# Patient Record
Sex: Male | Born: 1940 | Race: White | Hispanic: No | Marital: Married | Smoking: Former smoker
Health system: Southern US, Community
[De-identification: ages and names within clinical notes are randomized; demographics above are authoritative.]

## PROBLEM LIST (undated history)

## (undated) DIAGNOSIS — G4730 Sleep apnea, unspecified: Secondary | ICD-10-CM

## (undated) DIAGNOSIS — F1010 Alcohol abuse, uncomplicated: Secondary | ICD-10-CM

## (undated) DIAGNOSIS — K226 Gastro-esophageal laceration-hemorrhage syndrome: Secondary | ICD-10-CM

## (undated) DIAGNOSIS — F32A Depression, unspecified: Secondary | ICD-10-CM

## (undated) DIAGNOSIS — I499 Cardiac arrhythmia, unspecified: Secondary | ICD-10-CM

## (undated) DIAGNOSIS — K264 Chronic or unspecified duodenal ulcer with hemorrhage: Secondary | ICD-10-CM

## (undated) DIAGNOSIS — E039 Hypothyroidism, unspecified: Secondary | ICD-10-CM

## (undated) DIAGNOSIS — T8859XA Other complications of anesthesia, initial encounter: Secondary | ICD-10-CM

## (undated) DIAGNOSIS — M1990 Unspecified osteoarthritis, unspecified site: Secondary | ICD-10-CM

## (undated) DIAGNOSIS — M75100 Unspecified rotator cuff tear or rupture of unspecified shoulder, not specified as traumatic: Secondary | ICD-10-CM

## (undated) DIAGNOSIS — K7460 Unspecified cirrhosis of liver: Secondary | ICD-10-CM

## (undated) DIAGNOSIS — T4145XA Adverse effect of unspecified anesthetic, initial encounter: Secondary | ICD-10-CM

## (undated) DIAGNOSIS — C801 Malignant (primary) neoplasm, unspecified: Secondary | ICD-10-CM

## (undated) DIAGNOSIS — M1712 Unilateral primary osteoarthritis, left knee: Secondary | ICD-10-CM

## (undated) DIAGNOSIS — S1121XA Laceration without foreign body of pharynx and cervical esophagus, initial encounter: Secondary | ICD-10-CM

## (undated) DIAGNOSIS — K7030 Alcoholic cirrhosis of liver without ascites: Secondary | ICD-10-CM

## (undated) DIAGNOSIS — J189 Pneumonia, unspecified organism: Secondary | ICD-10-CM

## (undated) DIAGNOSIS — F1910 Other psychoactive substance abuse, uncomplicated: Secondary | ICD-10-CM

## (undated) DIAGNOSIS — T7840XA Allergy, unspecified, initial encounter: Secondary | ICD-10-CM

## (undated) HISTORY — DX: Laceration without foreign body of pharynx and cervical esophagus, initial encounter: S11.21XA

## (undated) HISTORY — DX: Sleep apnea, unspecified: G47.30

## (undated) HISTORY — PX: EYE SURGERY: SHX253

## (undated) HISTORY — DX: Unspecified rotator cuff tear or rupture of unspecified shoulder, not specified as traumatic: M75.100

## (undated) HISTORY — PX: KNEE SURGERY: SHX244

## (undated) HISTORY — PX: BACK SURGERY: SHX140

## (undated) HISTORY — PX: SPINE SURGERY: SHX786

## (undated) HISTORY — PX: ROTATOR CUFF REPAIR: SHX139

## (undated) HISTORY — PX: JOINT REPLACEMENT: SHX530

## (undated) HISTORY — PX: TONSILLECTOMY: SUR1361

## (undated) HISTORY — PX: OTHER SURGICAL HISTORY: SHX169

## (undated) HISTORY — PX: HERNIA REPAIR: SHX51

## (undated) HISTORY — DX: Allergy, unspecified, initial encounter: T78.40XA

## (undated) HISTORY — DX: Unspecified osteoarthritis, unspecified site: M19.90

## (undated) HISTORY — PX: CAUTERY OF TURBINATES: SHX1315

---

## 1973-07-03 DIAGNOSIS — Z87891 Personal history of nicotine dependence: Secondary | ICD-10-CM

## 1973-07-03 HISTORY — DX: Personal history of nicotine dependence: Z87.891

## 2003-10-18 ENCOUNTER — Ambulatory Visit (HOSPITAL_COMMUNITY): Admission: RE | Admit: 2003-10-18 | Discharge: 2003-10-18 | Payer: Self-pay | Admitting: Neurosurgery

## 2003-10-21 ENCOUNTER — Inpatient Hospital Stay (HOSPITAL_COMMUNITY): Admission: RE | Admit: 2003-10-21 | Discharge: 2003-10-22 | Payer: Self-pay | Admitting: Neurosurgery

## 2003-11-30 ENCOUNTER — Encounter: Admission: RE | Admit: 2003-11-30 | Discharge: 2003-11-30 | Payer: Self-pay | Admitting: Neurosurgery

## 2004-01-13 ENCOUNTER — Encounter: Admission: RE | Admit: 2004-01-13 | Discharge: 2004-01-13 | Payer: Self-pay | Admitting: Neurosurgery

## 2005-02-25 LAB — HM COLONOSCOPY: HM Colonoscopy: NORMAL

## 2005-06-27 ENCOUNTER — Inpatient Hospital Stay: Payer: Self-pay | Admitting: Gastroenterology

## 2005-07-25 ENCOUNTER — Ambulatory Visit: Payer: Self-pay | Admitting: Gastroenterology

## 2005-07-25 LAB — HM COLONOSCOPY

## 2007-11-25 ENCOUNTER — Ambulatory Visit: Payer: Self-pay | Admitting: Family Medicine

## 2007-11-26 ENCOUNTER — Ambulatory Visit: Payer: Self-pay | Admitting: Internal Medicine

## 2007-12-03 ENCOUNTER — Ambulatory Visit: Payer: Self-pay | Admitting: Internal Medicine

## 2007-12-27 ENCOUNTER — Ambulatory Visit: Payer: Self-pay | Admitting: Internal Medicine

## 2008-03-03 ENCOUNTER — Ambulatory Visit: Payer: Self-pay | Admitting: Family Medicine

## 2008-03-03 DIAGNOSIS — M1731 Unilateral post-traumatic osteoarthritis, right knee: Secondary | ICD-10-CM | POA: Insufficient documentation

## 2008-03-03 DIAGNOSIS — M1990 Unspecified osteoarthritis, unspecified site: Secondary | ICD-10-CM | POA: Insufficient documentation

## 2008-03-03 DIAGNOSIS — M2180 Other specified acquired deformities of unspecified limb: Secondary | ICD-10-CM | POA: Insufficient documentation

## 2008-03-03 DIAGNOSIS — IMO0002 Reserved for concepts with insufficient information to code with codable children: Secondary | ICD-10-CM | POA: Insufficient documentation

## 2008-03-03 DIAGNOSIS — M1710 Unilateral primary osteoarthritis, unspecified knee: Secondary | ICD-10-CM

## 2008-03-28 ENCOUNTER — Ambulatory Visit: Payer: Self-pay | Admitting: Internal Medicine

## 2008-04-06 ENCOUNTER — Ambulatory Visit: Payer: Self-pay | Admitting: Internal Medicine

## 2008-04-27 ENCOUNTER — Ambulatory Visit: Payer: Self-pay | Admitting: Internal Medicine

## 2008-05-28 ENCOUNTER — Ambulatory Visit: Payer: Self-pay | Admitting: Internal Medicine

## 2008-06-02 ENCOUNTER — Ambulatory Visit: Payer: Self-pay | Admitting: Internal Medicine

## 2008-06-28 ENCOUNTER — Ambulatory Visit: Payer: Self-pay | Admitting: Internal Medicine

## 2008-08-31 ENCOUNTER — Ambulatory Visit: Payer: Self-pay | Admitting: Family Medicine

## 2008-08-31 DIAGNOSIS — M719 Bursopathy, unspecified: Secondary | ICD-10-CM

## 2008-08-31 DIAGNOSIS — M67919 Unspecified disorder of synovium and tendon, unspecified shoulder: Secondary | ICD-10-CM | POA: Insufficient documentation

## 2008-09-29 ENCOUNTER — Encounter: Payer: Self-pay | Admitting: Family Medicine

## 2008-09-30 ENCOUNTER — Encounter: Payer: Self-pay | Admitting: Family Medicine

## 2008-09-30 ENCOUNTER — Telehealth: Payer: Self-pay | Admitting: Family Medicine

## 2008-12-15 ENCOUNTER — Ambulatory Visit: Payer: Self-pay | Admitting: Internal Medicine

## 2008-12-26 ENCOUNTER — Ambulatory Visit: Payer: Self-pay | Admitting: Internal Medicine

## 2010-06-18 ENCOUNTER — Encounter: Payer: Self-pay | Admitting: Neurosurgery

## 2010-06-27 NOTE — Miscellaneous (Signed)
  I called this in to [facility] for the patient.  Clinical Lists Changes  Medications: Added new medication of * SSKI ORAL IODINE SOLUTION Use as directed

## 2010-06-27 NOTE — Assessment & Plan Note (Signed)
Summary: NEW PT TO EST/CLE/FORM IN OCT. BLUE FOLDER**   Vital Signs:  Patient Profile:   70 Years Old Male Weight:      177.38 pounds (80.63 kg) Temp:     98.1 degrees F (36.72 degrees C) oral Pulse rate:   72 / minute Pulse rhythm:   regular BP sitting:   140 / 80  (left arm) Cuff size:   regular  Vitals Entered By: Silas Sacramento (March 03, 2008 11:06 AM)                 Chief Complaint:  New pt.  History of Present Illness: Very active 70 yo white male well known to me presents to discuss several medical issues, primarily musculoskeletal.  blood pressure, bp is good blood sugars have been good  LFT's have been higher. In the past, quit drinking, now has normalized. (quit 2009, summer)  walks three times a week  1 1/2 hours and several other times a week. One nose spray only (Flonase)  Wants to be able to hunt and lifting to keep fit.   two issues. B Knee pain, L > R, and shoulder pain intermittently L knee operation, done fifty years ago Intermittent swelling Open operation, removed bone fragment, done in 1958, so unsure of exact surgery  some OA   Shoulders and knees cause some problems  light weights, house works about with soloflex IROM bothers, shoulder    Past Medical History:    OA, multiple joints    Allergic Rhinitis    Esophageal Tear, 2007, no surgery    RTC Tear, L,  ~1999    Sciatic nerve, s/p neurosurgery  Past Surgical History:    Knee, open surgery, L, 1958, ?loose body/chondral defect    Inguinal herniorrhaphy    Rotator cuff repair, 1999, Ed Hines    Tonsillectomy    Back surgery, 2005   Family History:    Family History of Alcoholism/Addiction    Family History of Arthritis    Family History Breast cancer 1st degree relative <50    Family History Diabetes 1st degree relative    Family History Ovarian cancer  Social History:    Marital Status: Married    Children: 2    Occupation: semi-retired, Designer, fashion/clothing    [employer]  Quit alcohol, 10/2007    Chews tobacco    Avid hunter    Egbert Garibaldi hunting    Very active    Walks daily close to 1 hour   Risk Factors:  Colonoscopy History:     Date of Last Colonoscopy:  02/25/2005   Colonoscopy History:     Date of Last Colonoscopy:  02/25/2005    Results:  normal   Colonoscopy History:     Date of Last Colonoscopy:  05/28/2004   Colonoscopy History:     Date of Last Colonoscopy:  05/28/2004    Results:  normal    Review of Systems      See HPI  MS      Complains of joint pain, muscle aches, and stiffness.      Denies joint redness.      B shoulders, sometimes catch and have pain with IROM, movements at nighttime when rolling over  Neuro      Denies numbness and tingling.   Physical Exam  General:     Well-developed,well-nourished,in no acute distress; alert,appropriate and cooperative throughout examination Head:     Normocephalic and atraumatic without obvious abnormalities. No apparent alopecia or balding.  Ears:     no external deformities.   Nose:     no external deformity.   Additional Exam:     R knee Gait: Normal heel toe pattern ROM: WNL Effusion: neg Echymosis or edema: none Patellar tendon NT Painful PLICA: neg Patellar grind: positive Medial and lateral patellar facet loading:painful NT medial and lateral joint lines Mcmurray's neg Flexion-pinch neg Varus and valgus stress: stable Lachman: neg Ant and Post drawer: neg Hip abduction, IR, ER: WNL Hip flexion str: 5/5 Hip abd: 5/5 Quad: 5/5 No VMO atrophy  L knee Gait: Normal heel toe pattern ROM: WNL Effusion: neg Echymosis or edema: none Patellar tendon NT Painful PLICA: neg Patellar grind: positive Medial and lateral patellar facet loading: painful NT medial and lateral joint lines - markedly enlarged medial joint, large vertical incision scar Mcmurray's neg Flexion-pinch neg Varus and valgus stress: stable Lachman: significant anterior translation with no  endpoint: positive Ant and Post drawer: again with significant anterior translation compared to contralateral side Hip abduction, IR, ER: WNL  Shoulder: B Inspection: No muscle wasting mild winging B Ecchymosis/edema: neg  AC joint, scapula, clavicle: NT Abduction: full, 5/5 Flexion: full, 5/5 IR, full, lift-off: 5/5 ER at neutral: full, 5/5 AC crossover and compression: neg Neer: neg Hawkins: neg Drop Test: neg Empty Can: neg Supraspinatus insertion: NT Bicipital groove: NT Speed's: mildly positive Yergason's: neg Sulcus sign: neg Scapular dyskinesis: poor scapular stablizer development, lateral flaring on abduction    Impression & Recommendations:  Problem # 1:  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   Rotator cuff strengthening and scapular stabilization exercises were reviewed with the patient.  A handout was given based on a shoulder program from Dr. Graciella Freer of [facility] and the [facility].  Retraining shoulder mechanics and function was emphasized to the patient with rehab done at least 5-6 days a week.  The patient could benefit from formal PT to assist with scapular stabilization and RTC strengthening. If he make no improvement, another option  OA of glenohumeral joint, likely  Complete Medication List: 1)  Glucosamine-chondroitin 750-600 Mg Tabs (Glucosamine-chondroitin) 2)  Cvs Saw Palmetto 160 Mg Caps (Saw palmetto (serenoa repens)) 3)  Multivitamins Tabs (Multiple vitamin)   Patient Instructions: 1)  Shoulder rehab  2)  and Knee balance exercises 3)  Isometric  contractions of thigh - 10 x 10 secs 4)  3 way straight leg raises - build to 3 sets of 30 and then add weights 5)  begin with no weight. When 3 x 30 reached, Add 2 lb. ankle weight. Increase to 3,4,5,6 when 3x30 achieved. 6)  Drop squats - limit to 45 deg, 3x15 7)  Modified lunge - running position, 3x15 8)  Seated quad extensions, 3x15, add ankle weights 9)  Step downs, 3x15 with body weight slowly on downward phase 10)  Standing cone touches, 3x/day each leg   ] Flu Vaccine Result Date:  02/26/2008 Flu Vaccine Result:  given TD Result Date:  05/28/2004 TD Result:  given Colonoscopy Result Date:  05/28/2004 Colonoscopy Result:  normal

## 2010-06-27 NOTE — Assessment & Plan Note (Signed)
Summary: DISCUSS ?REFERRAL FOR SHOULDER/CLE   Vital Signs:  Patient profile:   70 year old male Height:      72 inches Weight:      182.2 pounds BMI:     24.80 Temp:     98.1 degrees F oral Pulse rate:   72 / minute Pulse rhythm:   regular BP sitting:   124 / 60  (left arm) Cuff size:   regular  Vitals Entered By: Benny Lennert CMA (August 31, 2008 12:14 PM)  History of Present Illness: Chief complaint shoulder pain  the patient is a very pleasant 70 year old gentleman who is very well-known to me who presents with long-standing right shoulder pain that has recently been worsening over the last several months.  He denies any specific injury or trauma to the affected joint. He denies dislocation or subluxation. Denies any traumatic fracture or injury to his shoulder.  He does have a history of left rotator cuff rupture which was repaired by Dr. Cyndi Lennert distantly. With an open rotator cuff repair.  Does have some pain with reaching across her body and with doing overhead activities. He has some pain with casting a fishing rod. At this point he has tried some oral medications which is provided minimal relief.  He has not done any formal physical therapy and he has not had an intra-articular or subacromial Kenalog Injection. He did have one or 2 subacromial injections in the left shoulder prior to his previous rotator cuff repair.  Allergies (verified): No Known Drug Allergies  Past History:  Past medical, surgical, family and social histories (including risk factors) reviewed, and no changes noted (except as noted below).  Past Medical History:    Reviewed history from 03/03/2008 and no changes required:    OA, multiple joints    Allergic Rhinitis    Esophageal Tear, 2007, no surgery    RTC Tear, L,  ~1999    Sciatic nerve, s/p neurosurgery  Past Surgical History:    Knee, open surgery, L, 1958, ?loose body/chondral defect    Inguinal herniorrhaphy    Rotator cuff repair,  L shoulder, 1999, Ed Hines    Tonsillectomy    Back surgery, 2005  Family History:    Reviewed history from 03/03/2008 and no changes required:       Family History of Alcoholism/Addiction       Family History of Arthritis       Family History Breast cancer 1st degree relative <50       Family History Diabetes 1st degree relative       Family History Ovarian cancer  Social History:    Reviewed history from 03/03/2008 and no changes required:       Marital Status: Married       Children: 2       Occupation: semi-retired, Designer, 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 Otherwise the pertinent positives of the ROS  are noted above. Further ROS may be demarcated in the ROS section of Centricity, If none, then this plus the HPI constitutes the ROS.    Physical Exam  General:  GEN: Well-developed,well-nourished,in no acute distress; alert,appropriate and cooperative throughout examination HEENT: Normocephalic and atraumatic without obvious abnormalities. No apparent alopecia or balding. Ears, externally no deformities PULM: Breathing comfortably in no respiratory distress EXT: No clubbing, cyanosis, or edema PSYCH: Normally interactive. Cooperative during the interview. Pleasant. Friendly and conversant. Not anxious or depressed appearing. Normal, full affect.  Msk:  with the left shoulder, the patient has full range of motion. He does have evidence of a prior open rotator cuff repair. His cuff is intact, and he does have some mild impingement symptoms. His Juanetta Gosling is positive.  Shoulder:right Inspection: No muscle  wasting or winging Ecchymosis/edema: neg  AC joint, scapula, clavicle: NT Cervical spine: NT, full ROM Spurling's: neg Abduction: full, 5/5, does have a painful arc of motion Flexion: full, 5/5 IR, full, lift-off: 5/5 ER at neutral: full, 5/5 AC crossover: neg Neer: pos Hawkins: pos Drop Test: neg, but painful with lowering. Empty Can: pos Supraspinatus insertion: mild-mod T Bicipital groove: NT Speed's: neg Yergason's: neg Sulcus sign: neg Scapular dyskinesis: some elevation of the scapula bilaterally with abduction. C5-T1 intact  Neuro: Sensation intact Grip 5/5    Impression & Recommendations:  Problem # 1:  ROTATOR CUFF SYNDROME, RIGHT (ICD-726.10) Assessment New Shoulder anatomy was reviewed with the patient using and anatomical model.   Rotator cuff strengthening and scapular stabilization exercises were reviewed with the patient.  A handout was given based on a shoulder program from [facility]  Retraining shoulder mechanics and function was emphasized to the patient with rehab done at least 5-6 days a week.  The patient referred for formal PT to assist with scapular stabilization and RTC strengthening.   SubAC Injection, right shoulder Verbal consent was obtained from the patient. Risks, benefits, and alternatives were explained. Patient prepped with Betadine and Ethyl Chloride used for anesthesia. The subacromial space was injected using the posterior approach. The patient tolerated the procedure well and had decreased pain post injection. No complications. Injection: 9 cc of Marcaine 0.5% and 1cc of Kenalog 40 mg. Needle: 22 gauge   Orders: Physical Therapy Referral (PT) Joint Aspirate / Injection, Large (20610) Kenalog 10mg  (4units) (J3301)  Complete Medication List: 1)  Glucosamine-chondroitin 750-600 Mg Tabs (Glucosamine-chondroitin) 2)  Cvs Saw Palmetto 160 Mg Caps (Saw palmetto (serenoa repens)) 3)  Multivitamins Tabs (Multiple vitamin) 4)  Diclofenac  Sodium 75 Mg Tbec (Diclofenac sodium) .Marland Kitchen.. 1 by mouth twice daily. take with food Prescriptions: DICLOFENAC SODIUM 75 MG TBEC (DICLOFENAC SODIUM) 1 by mouth twice daily. take with food  #60 x 1   Entered and Authorized by:   Hannah Beat MD   Signed by:   Hannah Beat MD on 08/31/2008   Method used:   Print then Give to Patient   RxID:   [id number]       Current Allergies (reviewed today): No known allergies

## 2010-06-27 NOTE — Medication Information (Signed)
Summary: Letter from pt requesting Potassium Iodide  Letter from pt requesting Potassium Iodide   Imported By: Beau Fanny 10/01/2008 08:47:02  _____________________________________________________________________  External Attachment:    Type:   Image     Comment:   External Document

## 2010-08-14 ENCOUNTER — Ambulatory Visit (INDEPENDENT_AMBULATORY_CARE_PROVIDER_SITE_OTHER): Payer: PRIVATE HEALTH INSURANCE | Admitting: Family Medicine

## 2010-08-14 ENCOUNTER — Encounter: Payer: Self-pay | Admitting: Family Medicine

## 2010-08-14 ENCOUNTER — Ambulatory Visit (INDEPENDENT_AMBULATORY_CARE_PROVIDER_SITE_OTHER)
Admission: RE | Admit: 2010-08-14 | Discharge: 2010-08-14 | Disposition: A | Discharge status: Home / Self Care | Payer: PRIVATE HEALTH INSURANCE | Source: Ambulatory Visit | Attending: Family Medicine | Admitting: Family Medicine

## 2010-08-14 ENCOUNTER — Other Ambulatory Visit: Payer: Self-pay | Admitting: Family Medicine

## 2010-08-14 DIAGNOSIS — M1710 Unilateral primary osteoarthritis, unspecified knee: Secondary | ICD-10-CM

## 2010-08-14 DIAGNOSIS — M66329 Spontaneous rupture of flexor tendons, unspecified upper arm: Secondary | ICD-10-CM

## 2010-08-14 DIAGNOSIS — IMO0002 Reserved for concepts with insufficient information to code with codable children: Secondary | ICD-10-CM

## 2010-08-24 NOTE — Assessment & Plan Note (Signed)
Summary: LEFT KNEE/CLE   MEDCOST   Vital Signs:  Patient profile:   70 year old male Height:      72 inches Weight:      179.25 pounds BMI:     24.40 Temp:     98.5 degrees F oral Pulse rate:   72 / minute Pulse rhythm:   regular BP sitting:   150 / 80  (left arm) Cuff size:   regular  Vitals Entered By: Benny Lennert CMA Duncan Dull) (August 14, 2010 3:32 PM)  History of Present Illness: Chief complaint left knee pain  70 year old male:  L knee pain: long-standing knee pain history, worsened over the last couple of years. Will hike about every other day. Not doing any LE strengthening. No tylenol, NSAIDS. Taking glucosamine and chondroitin. No effusion, no symptomatic giving-way.   L knee operation, done fifty years ago Intermittent swelling Open operation, removed bone fragment, done in 1958, so unsure of exact surgery  R biceps tendon probable partial rupture: Not symptomatic, but there is a upper, lateral defect. Str is very good, works out a few days a week with UE strengthening with bands. RTC, scap training, biceps.   REVIEW OF SYSTEMS  GEN: No systemic complaints, no fevers, chills, sweats, or other acute illnesses MSK: Detailed in the HPI GI: tolerating PO intake without difficulty Neuro: No numbness, parasthesias, or tingling associated. Otherwise the pertinent positives of the ROS are noted above.    Allergies (verified): No Known Drug Allergies  Past History:  Past medical, surgical, family and social histories (including risk factors) reviewed, and no changes noted (except as noted below).  Past Medical History: OA, multiple joints Allergic Rhinitis Esophageal Tear, 2007, no surgery RTC Tear, L,  ~1999 Sciatic nerve, s/p neurosurgery Clinically absent ACL  Past Surgical History: Reviewed history from 08/31/2008 and no changes required. Knee, open surgery, L, 1958, ?loose body/chondral defect Inguinal herniorrhaphy Rotator cuff repair, L shoulder,  1999, Ed Hines Tonsillectomy Back surgery, 2005  Family History: Reviewed history from 03/03/2008 and no changes required. Family History of Alcoholism/Addiction Family History of Arthritis Family History Breast cancer 1st degree relative <50 Family History Diabetes 1st degree relative Family History Ovarian cancer  Social History: Reviewed history from 03/03/2008 and no changes required. Marital Status: Married Children: 2 Occupation: semi-retired, Designer, fashion/clothing [employer] Quit alcohol, 10/2007 Chews tobacco Avid hunter Egbert Garibaldi hunting Very active Walks daily close to 1 hour  Physical Exam  General:  GEN: Well-developed,well-nourished,in no acute distress; alert,appropriate and cooperative throughout examination HEENT: Normocephalic and atraumatic without obvious abnormalities. No apparent alopecia or balding. Ears, externally no deformities PULM: Breathing comfortably in no respiratory distress EXT: No clubbing, cyanosis, or edema PSYCH: Normally interactive. Cooperative during the interview. Pleasant. Friendly and conversant. Not anxious or depressed appearing. Normal, full affect.  Msk:  L knee Gait: Normal heel toe pattern ROM: WNL Effusion: neg Echymosis or edema: none Patellar tendon NT Painful PLICA: neg Patellar grind: positive Medial and lateral patellar facet loading: painful NT medial and lateral joint lines - markedly enlarged medial joint, large vertical incision scar Mcmurray's neg Flexion-pinch neg Varus and valgus stress: stable Lachman: significant anterior translation with no endpoint: positive Ant and Post drawer: again with significant anterior translation compared to contralateral side Hip abduction, IR, ER: WNL   Shoulder/Elbow Exam  Shoulder Exam:    Right:    Inspection:  Normal    Palpation:  Normal    Stability:  stable    Tenderness:  no  Swelling:  no    Erythema:  no    palpable defect, small, R upper lateral biceps. 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   Worsening, but not really hurting now. I would keep up str, activity, NSAIDS, Tylenol if bad. If he is hurting before needing to go on a trip or after a lot of exertion, we can deal with flares then.  I suspect he ruptured his ACL distantly, which may have accelerated his OA of the L.  Orders: T-DG Knee Bilateral Standing AP (16109) T-Knee Left 2 view (73560TC)  Problem # 2:  BICEPS TENDON RUPTURE, RIGHT (ICD-727.62) distant without significant str or functional defects at this point. Continue to str train as this is the best functional way to deal with this prior injury.  Complete Medication List: 1)  Glucosamine-chondroitin 750-600 Mg Tabs (Glucosamine-chondroitin) 2)  Cvs Saw Palmetto 160 Mg Caps (Saw palmetto (serenoa repens)) 3)  Multivitamins Tabs (Multiple vitamin) 4)  Sski Oral Iodine Solution  .... Use as directed   Orders Added: 1)   T-DG Knee Bilateral Standing AP [73565] 2)  T-Knee Left 2 view [73560TC] 3)  Est. Patient Level IV [60454]    Current Allergies (reviewed today): No known allergies

## 2010-10-13 NOTE — Op Note (Signed)
NAMEDEAKEN, Tony Luna                             ACCOUNT NO.:  [id number]   MEDICAL RECORD NO.:  [id number]                   PATIENT TYPE:  INP   LOCATION:  3040                                 FACILITY:  [facility]   PHYSICIAN:  Reinaldo Meeker, M.D.              DATE OF BIRTH:  December 15, 1940   DATE OF PROCEDURE:  10/21/2003  DATE OF DISCHARGE:                                 OPERATIVE REPORT   PREOPERATIVE DIAGNOSIS:  Foraminal stenosis and herniated disk, L5-S1 left.   POSTOPERATIVE DIAGNOSIS:  Foraminal stenosis and herniated disk, L5-S1 left.   OPERATION PERFORMED:  Left L5-S1 transforaminal lumbar interbody fusion with  Nuvasive bony spacers and autologous bone graft followed by left pedicle  screw instrumentation and right transfacet screw instrumentation followed by  L5-S1 posterolateral fusion.   SECONDARY PROCEDURE:  Microdissection, L5-S1 disk and L5-S1 nerve roots as  well as intraoperative EMG monitoring.   SURGEON:  Reinaldo Meeker, M.D.   ASSISTANT:  Tia Alert, MD   ANESTHESIA:  General.   DESCRIPTION OF PROCEDURE:  After being placed in the prone position, the  patient's back was prepped and draped in the usual sterile fashion.  A  localizing x-ray was taken prior to the incision to identify the appropriate  level.  Midline incision was made above the spinous processes of L4, L5 and  S1.  Using Bovie cutting current, the incision was carried down to spinous  processes.  Subperiosteal dissection was then carried out bilaterally on the  spinous processes and lamina.  A self-retaining retractor was placed for  exposure.  Further lateral exposure was carried out on the left, symptomatic  side.  X-ray showed approach to the appropriate level.  Using Stille  rongeurs, spinous process and interspinous ligament were removed at L5-S1.  A very generous laminotomy was then performed on the left side at L5-S1 by  removing the inferior three quarters of the L5 lamina and  medial three  quarters of the facet joint and the superior one half of the S1 lamina.  Residual bone was removed and saved to be used later in the case.  Ligamentum flavum was removed in a piecemeal fashion.  S1 and L5 nerve roots  were both identified and thoroughly decompressed.  L5-S1 disk was then  coagulated on the annulus and then incised with a 15 blade.  Using pituitary  rongeurs and curets, thorough disk space clean out was carried out.  At the  same time great care was taken to avoid injury to the neural elements and  this was successfully done.  At this point progressive distraction of the  disk space was carried out until it was distracted up to a 9 mm height.  A  variety of scraping instruments were then used to prepare the interspace for  bony spacer.  Prior placing the 8 mm bony spacer, autologous bone graft was  placed  deep within the interspace to help  with the interbody fusion.  Bony  spacer was then placed without difficulty, fluoroscopy showed it to be in  good position.  Pedicle screw fixation was then carried out on the left side  in a standard fashion.  A 45 mm screw was used at L5 and 3 mm screw used at  S1.  Fluoroscopy was used to help with placement of the screws.  When the  screws were noted to be in good position, appropriate length rod was then  chosen and top locking nuts were placed.  Prior to securing the final  tightening on the second nut, compression was carried out.  At this point  transfacet screw placement was carried out on the patients right side.  A  drill was used to start a small starting spot.  Under fluoroscopic guidance,  drill over guidewire was passed to the __________  facet of L5 down toward  the pedicle of S1.  AP and lateral fluoroscopy were both used to confirm  good direction of the drill.  When the drill was in good position, it was  removed.  Palpation of the drill hole showed no evidence of pedicle breech.  A guidewire was also used  along with EMG monitoring which showed no evidence  of breech.  Tapping was then carried out over the guidewire.  Which was then  removed.  A 30 mm screw was then placed into good position.  Final EMG  monitoring again showed no evidence of breech of the pedicle nor did AP and  lateral fluoroscopy.  At this time large amounts of irrigation were carried  out.  A high speed drill was used to decorticate the L5 and S1 lamina, facet  joint on the patient's right side.  Posterolateral fusion was carried out by  placing autologous bone graft in this area.  Gelfoam was then placed on the  left side and the wound was then closed in multiple layers of Vicryl on the  muscle fascia, subcutaneous and subcuticular tissues and staples on the  skin.  A sterile dressing was then applied.  The patient was extubated and  taken to the recovery room in stable condition.   At this point large amounts of irrigation were carried out and any bleeding  controlled with bipolar coagulation and Gelfoam.  The wound was then closed  using interrupted Vicryl in the muscle, fascia, subcutaneous and  subcuticular tissues and staples on the skin.  A sterile dressing was then  applied and the patient was extubated and taken to the recovery room in  stable condition.                                               Reinaldo Meeker, M.D.    ROK/MEDQ  D:  10/21/2003  T:  10/22/2003  Job:  [id number]

## 2011-02-14 DIAGNOSIS — C44320 Squamous cell carcinoma of skin of unspecified parts of face: Secondary | ICD-10-CM | POA: Insufficient documentation

## 2011-03-09 ENCOUNTER — Ambulatory Visit: Payer: Self-pay | Admitting: Unknown Physician Specialty

## 2011-03-13 ENCOUNTER — Ambulatory Visit (INDEPENDENT_AMBULATORY_CARE_PROVIDER_SITE_OTHER): Payer: MEDICARE | Admitting: Family Medicine

## 2011-03-13 ENCOUNTER — Encounter: Payer: Self-pay | Admitting: Family Medicine

## 2011-03-13 VITALS — BP 140/90 | HR 78 | Temp 98.7°F | Ht 72.0 in | Wt 175.8 lb

## 2011-03-13 DIAGNOSIS — IMO0002 Reserved for concepts with insufficient information to code with codable children: Secondary | ICD-10-CM

## 2011-03-13 DIAGNOSIS — M1710 Unilateral primary osteoarthritis, unspecified knee: Secondary | ICD-10-CM

## 2011-03-13 MED ORDER — TRAMADOL HCL 50 MG PO TABS: 50.0000 mg | ORAL_TABLET | Freq: Four times a day (QID) | ORAL | Status: AC | PRN

## 2011-03-13 NOTE — Progress Notes (Signed)
  Subjective:    Patient ID: Tony Luna, male    DOB: 1940/07/02, 70 y.o.   MRN: [id number]  HPI  Tony Luna, a 70 y.o. male presents today in the office for the following:    Left knee, went out to [place], has a hinged brace. Incrementally, is getting worse over time. 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 He has remote trauma of the left knee, I feel that he had an anterior cruciate ligament deficient knee at that time on my last exam. For the last 6 months, his symptoms have been worsening, but he is still able to hike most days, and able to walk for 2 hours straight. He will have some occasional aching. He does take some glucosamine and chondroitin, and occasionally will take some anti-inflammatories.  Left arm, medial, aching some, a taught tendon is felt  The PMH, PSH, Social History, Family History, Medications, and allergies have been reviewed in [facility], and have been updated if relevant.   Review of Systems REVIEW OF SYSTEMS  GEN: No fevers, chills. Nontoxic. Primarily MSK c/o today. MSK: Detailed in the HPI GI: tolerating PO intake without difficulty Neuro: No numbness, parasthesias, or tingling associated. Otherwise the pertinent positives of the ROS are noted above.      Objective:   Physical Exam   Physical Exam  Blood pressure 140/90, pulse 78, temperature 98.7 F (37.1 C), temperature source Oral, height 6' (1.829 m), weight 175 lb 12.8 oz (79.742 kg), SpO2 98.00%.  GEN: WDWN, NAD, Non-toxic, A & O x 3 HEENT: Atraumatic, Normocephalic. Neck supple. No masses, No LAD. Ears and Nose: No external deformity. EXTR: No c/c/e NEURO Normal gait.  PSYCH: Normally interactive. Conversant. Not depressed or anxious appearing.  Calm demeanor.   Left knee: Mild tenderness to palpation in the medial and lateral joint lines. Increase opening with stress of the MCL. Lachman is positive.  Anterior drawer is positive. Negative McMurray's. Negative flexion pinch. Extension is to 0 and flexion to 130.      Assessment & Plan:   OA: We discussed treatment strategies including: Tylenol on a routine bases, 2 tablets up to 3-4 times a day For very bad days, May take NSAIDS During an acute flare, intraarticular corticosteroids can be helpful. Hyaluronic Acid injections also have had good success in treating grade I - III OA, not grade IV  An off-loader brace can be helpful in certain cases - he has one that seems to help.  For now, he is relatively only mildly symptomatic. I do suspect he will have some progression in his osteoarthritis, but I encouraged him in his workouts. If he becomes more symptomatic, then we can address acute flareups at that time. I reassured him that I do not think that he is close to needing a knee replacement.

## 2011-04-13 ENCOUNTER — Ambulatory Visit: Payer: Self-pay | Admitting: Family Medicine

## 2011-08-01 ENCOUNTER — Encounter: Payer: Self-pay | Admitting: Family Medicine

## 2011-08-01 ENCOUNTER — Ambulatory Visit (INDEPENDENT_AMBULATORY_CARE_PROVIDER_SITE_OTHER): Payer: MEDICARE | Admitting: Family Medicine

## 2011-08-01 DIAGNOSIS — M25369 Other instability, unspecified knee: Secondary | ICD-10-CM

## 2011-08-01 DIAGNOSIS — IMO0002 Reserved for concepts with insufficient information to code with codable children: Secondary | ICD-10-CM

## 2011-08-01 DIAGNOSIS — M2350 Chronic instability of knee, unspecified knee: Secondary | ICD-10-CM

## 2011-08-01 DIAGNOSIS — M1710 Unilateral primary osteoarthritis, unspecified knee: Secondary | ICD-10-CM

## 2011-08-01 DIAGNOSIS — M238X9 Other internal derangements of unspecified knee: Secondary | ICD-10-CM

## 2011-08-01 NOTE — Progress Notes (Signed)
  Patient Name: Tony Luna Date of Birth: [birth year] Age: 71 y.o. Medical Record Number: [id number] Gender: male Date of Encounter: 08/01/2011  History of Present Illness:  Tony Luna is a 71 y.o. very pleasant male patient who presents with the following:  Chronic left knee, doing a lot of strengthening.  Doing some decent weiht.   Still is unstable.  He is not having to take any significant amount of pain medication. He will occasionally take some Aleve. No narcotic medication. Minimal Tylenol usage. No significant swelling.  The primary problem that the patient is having is that his knee will occasionally functionally give way while he is trying to use it and when his knee gets in a certain position and will buckle. He is having some pain and some occasional grinding. He is quite active and is doing a lot of strengthening the knee. No significant swelling.  [phone]  Past Medical History, Surgical History, Social History, Family History, Problem List, Medications, and Allergies have been reviewed and updated if relevant.  Review of Systems:  GEN: No fevers, chills. Nontoxic. Primarily MSK c/o today. MSK: Detailed in the HPI GI: tolerating PO intake without difficulty Neuro: No numbness, parasthesias, or tingling associated. Otherwise the pertinent positives of the ROS are noted above.    Physical Examination: Filed Vitals:   08/01/11 1113  BP: 130/86  Pulse: 73  Temp: 97.6 F (36.4 C)  TempSrc: Oral  Height: 6' (1.829 m)  Weight: 178 lb 6.4 oz (80.922 kg)  SpO2: 100%    Body mass index is 24.20 kg/(m^2).   GEN: WDWN, NAD, Non-toxic, Alert & Oriented x 3 HEENT: Atraumatic, Normocephalic.  Ears and Nose: No external deformity. EXTR: No clubbing/cyanosis/edema NEURO: Normal gait.  PSYCH: Normally interactive. Conversant. Not depressed or anxious appearing.  Calm demeanor.   Knees: full extension. Flexion to 125 bilaterally. Mild tenderness on the medial  and lateral joint lines. Notable patellar crepitus and crepitus with flexion and extension bilaterally. Stable to varus and valgus stress bilaterally. Lachman is positive on the LEFT. Anterior drawer positive on the LEFT. Noticeable difference compared to the RIGHT. Posterior drawer is negative. Flexion pinches negative. McMurray's test is negative.  Strength testing is 5/5 throughout.  Assessment and Plan: 1. DEGENERATIVE JOINT DISEASE, LEFT KNEE   2. Cruciate ligament, anterior, disruption, old   3. 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 I think that he is having some functional giving way while he is walking and hiking do to a functionally absent ACL. This is limiting his ability to be physically active. He is otherwise having some osteoarthritic pain, but does not want to take routine pain medication. He is not flared up to the point now where he needs an injection.  In this highly active, highly motivated patient, I think that a functional osteoarthritis and functional anterior crucial ligament brace is the best idea to give him support while he is being physically active, and keeping active for his overall health as well as his muscular integrity and joint support system.

## 2014-06-09 ENCOUNTER — Other Ambulatory Visit (HOSPITAL_COMMUNITY): Payer: MEDICARE

## 2014-06-14 ENCOUNTER — Encounter (HOSPITAL_COMMUNITY)
Admission: RE | Admit: 2014-06-14 | Discharge: 2014-06-14 | Disposition: A | Discharge status: Home / Self Care | Payer: PPO | Source: Ambulatory Visit | Attending: Otolaryngology | Admitting: Otolaryngology

## 2014-06-14 ENCOUNTER — Encounter (HOSPITAL_COMMUNITY): Payer: Self-pay

## 2014-06-14 DIAGNOSIS — Z01818 Encounter for other preprocedural examination: Secondary | ICD-10-CM | POA: Insufficient documentation

## 2014-06-14 DIAGNOSIS — Z01810 Encounter for preprocedural cardiovascular examination: Secondary | ICD-10-CM | POA: Diagnosis not present

## 2014-06-14 DIAGNOSIS — M1712 Unilateral primary osteoarthritis, left knee: Secondary | ICD-10-CM | POA: Insufficient documentation

## 2014-06-14 DIAGNOSIS — Z01812 Encounter for preprocedural laboratory examination: Secondary | ICD-10-CM | POA: Diagnosis present

## 2014-06-14 HISTORY — DX: Other complications of anesthesia, initial encounter: T88.59XA

## 2014-06-14 HISTORY — DX: Cardiac arrhythmia, unspecified: I49.9

## 2014-06-14 HISTORY — DX: Pneumonia, unspecified organism: J18.9

## 2014-06-14 HISTORY — DX: Hypothyroidism, unspecified: E03.9

## 2014-06-14 HISTORY — DX: Adverse effect of unspecified anesthetic, initial encounter: T41.45XA

## 2014-06-14 LAB — BASIC METABOLIC PANEL
Anion gap: 3 — ABNORMAL LOW (ref 5–15)
BUN: 17 mg/dL (ref 6–23)
CO2: 31 mmol/L (ref 19–32)
Calcium: 9 mg/dL (ref 8.4–10.5)
Chloride: 103 mEq/L (ref 96–112)
Creatinine, Ser: 0.96 mg/dL (ref 0.50–1.35)
GFR calc Af Amer: >90 mL/min (ref >90)
GFR calc non Af Amer: 81 mL/min — ABNORMAL LOW (ref >90)
Glucose, Bld: 118 mg/dL — ABNORMAL HIGH (ref 70–99)
Potassium: 3.7 mmol/L (ref 3.5–5.1)
Sodium: 137 mmol/L (ref 135–145)

## 2014-06-14 LAB — PROTIME-INR
INR: 1.08 (ref 0.00–1.49)
Prothrombin Time: 14.1 seconds (ref 11.6–15.2)

## 2014-06-14 LAB — SURGICAL PCR SCREEN
MRSA, PCR: NEGATIVE
Staphylococcus aureus: POSITIVE — AB

## 2014-06-14 LAB — CBC
HCT: 42.1 % (ref 39.0–52.0)
Hemoglobin: 15.2 g/dL (ref 13.0–17.0)
MCH: 31.1 pg (ref 26.0–34.0)
MCHC: 36.1 g/dL — ABNORMAL HIGH (ref 30.0–36.0)
MCV: 86.1 fL (ref 78.0–100.0)
Platelets: 127 10*3/uL — ABNORMAL LOW (ref 150–400)
RBC: 4.89 MIL/uL (ref 4.22–5.81)
RDW: 12.7 % (ref 11.5–15.5)
WBC: 5.2 10*3/uL (ref 4.0–10.5)

## 2014-06-14 LAB — APTT: aPTT: 31 seconds (ref 24–37)

## 2014-06-14 NOTE — Pre-Procedure Instructions (Addendum)
Tony Luna  06/14/2014   Your procedure is scheduled on:  06/22/14  Report to [facility] short stay admitting at  62AM.  Call this number if you have problems the morning of surgery: [phone]   Remember:   Do not eat food or drink liquids after midnight.   Take these medicines the morning of surgery with A SIP OF WATER: thyroid, testosterone   STOP all herbel meds, nsaids (aleve,naproxen,advil,ibuprofen) 5 days prior to surgery starting 06/17/14 including vitamins, aspirin, ALL OVER THE COUNTER SUPPLEMENTS     Do not wear jewelry, make-up or nail polish.  Do not wear lotions, powders, or perfumes. You may wear deodorant.  Do not shave 48 hours prior to surgery. Men may shave face and neck.  Do not bring valuables to the hospital.  [facility] is not responsible                  for any belongings or valuables.               Contacts, dentures or bridgework may not be worn into surgery.  Leave suitcase in the car. After surgery it may be brought to your room.  For patients admitted to the hospital, discharge time is determined by your                treatment team.               Patients discharged the day of surgery will not be allowed to drive  home.  Name and phone number of your driver:   Special Instructions:  Special Instructions: [place] - Preparing for Surgery  Before surgery, you can play an important role.  Because skin is not sterile, your skin needs to be as free of germs as possible.  You can reduce the number of germs on you skin by washing with CHG (chlorahexidine gluconate) soap before surgery.  CHG is an antiseptic cleaner which kills germs and bonds with the skin to continue killing germs even after washing.  Please DO NOT use if you have an allergy to CHG or antibacterial soaps.  If your skin becomes reddened/irritated stop using the CHG and inform your nurse when you arrive at Short Stay.  Do not shave (including legs and underarms) for at least 48 hours  prior to the first CHG shower.  You may shave your face.  Please follow these instructions carefully:   1.  Shower with CHG Soap the night before surgery and the morning of Surgery.  2.  If you choose to wash your hair, wash your hair first as usual with your normal shampoo.  3.  After you shampoo, rinse your hair and body thoroughly to remove the Shampoo.  4.  Use CHG as you would any other liquid soap.  You can apply chg directly  to the skin and wash gently with scrungie or a clean washcloth.  5.  Apply the CHG Soap to your body ONLY FROM THE NECK DOWN.  Do not use on open wounds or open sores.  Avoid contact with your eyes ears, mouth and genitals (private parts).  Wash genitals (private parts)       with your normal soap.  6.  Wash thoroughly, paying special attention to the area where your surgery will be performed.  7.  Thoroughly rinse your body with warm water from the neck down.  8.  DO NOT shower/wash with your normal soap after using and rinsing off  the CHG Soap.  9.  Pat yourself dry with a clean towel.            10.  Wear clean pajamas.            11.  Place clean sheets on your bed the night of your first shower and do not sleep with pets.  Day of Surgery  Do not apply any lotions/deodorants the morning of surgery.  Please wear clean clothes to the hospital/surgery center.   Please read over the following fact sheets that you were given: Pain Booklet, Coughing and Deep Breathing, Total Joint Packet, MRSA Information and Surgical Site Infection Prevention

## 2014-06-14 NOTE — Progress Notes (Signed)
Platelet count varies at times -low- no tx

## 2014-06-21 MED ORDER — CEFAZOLIN SODIUM-DEXTROSE 2-3 GM-% IV SOLR
2.0000 g | INTRAVENOUS | Status: DC
  Filled 2014-06-21: qty 50

## 2014-06-22 ENCOUNTER — Encounter (HOSPITAL_COMMUNITY)
Admission: RE | Disposition: A | Discharge status: Home Health | Payer: Self-pay | Source: Ambulatory Visit | Attending: Orthopedic Surgery

## 2014-06-22 ENCOUNTER — Encounter (HOSPITAL_COMMUNITY): Payer: Self-pay | Admitting: *Deleted

## 2014-06-22 ENCOUNTER — Inpatient Hospital Stay (HOSPITAL_COMMUNITY)
Admission: RE | Admit: 2014-06-22 | Discharge: 2014-06-24 | DRG: 470 | Disposition: A | Discharge status: Home Health | Payer: PPO | Source: Ambulatory Visit | Attending: Orthopedic Surgery | Admitting: Orthopedic Surgery

## 2014-06-22 ENCOUNTER — Inpatient Hospital Stay (HOSPITAL_COMMUNITY): Payer: PPO

## 2014-06-22 ENCOUNTER — Inpatient Hospital Stay (HOSPITAL_COMMUNITY): Payer: PPO | Admitting: Certified Registered Nurse Anesthetist

## 2014-06-22 DIAGNOSIS — M1710 Unilateral primary osteoarthritis, unspecified knee: Secondary | ICD-10-CM | POA: Diagnosis present

## 2014-06-22 DIAGNOSIS — R339 Retention of urine, unspecified: Secondary | ICD-10-CM | POA: Diagnosis present

## 2014-06-22 DIAGNOSIS — M1712 Unilateral primary osteoarthritis, left knee: Secondary | ICD-10-CM | POA: Diagnosis present

## 2014-06-22 DIAGNOSIS — Z96652 Presence of left artificial knee joint: Secondary | ICD-10-CM

## 2014-06-22 DIAGNOSIS — N400 Enlarged prostate without lower urinary tract symptoms: Secondary | ICD-10-CM | POA: Diagnosis present

## 2014-06-22 DIAGNOSIS — E039 Hypothyroidism, unspecified: Secondary | ICD-10-CM | POA: Diagnosis present

## 2014-06-22 DIAGNOSIS — R338 Other retention of urine: Secondary | ICD-10-CM

## 2014-06-22 DIAGNOSIS — Z87891 Personal history of nicotine dependence: Secondary | ICD-10-CM

## 2014-06-22 DIAGNOSIS — M179 Osteoarthritis of knee, unspecified: Secondary | ICD-10-CM | POA: Diagnosis present

## 2014-06-22 HISTORY — DX: Unilateral primary osteoarthritis, left knee: M17.12

## 2014-06-22 HISTORY — PX: TOTAL KNEE ARTHROPLASTY: SHX125

## 2014-06-22 SURGERY — ARTHROPLASTY, KNEE, TOTAL
Anesthesia: Monitor Anesthesia Care | Laterality: Left

## 2014-06-22 MED ORDER — SODIUM CHLORIDE 0.9 % IR SOLN
Status: DC | PRN
  Administered 2014-06-22: 1000 mL

## 2014-06-22 MED ORDER — BACLOFEN 10 MG PO TABS: 10.0000 mg | ORAL_TABLET | Freq: Three times a day (TID) | ORAL | Status: DC

## 2014-06-22 MED ORDER — STERILE WATER FOR INJECTION IJ SOLN
INTRAMUSCULAR | Status: AC
  Filled 2014-06-22: qty 10

## 2014-06-22 MED ORDER — PHENYLEPHRINE HCL 10 MG/ML IJ SOLN
INTRAMUSCULAR | Status: DC | PRN
  Administered 2014-06-22 (×2): 40 ug via INTRAVENOUS

## 2014-06-22 MED ORDER — RIVAROXABAN 10 MG PO TABS
10.0000 mg | ORAL_TABLET | Freq: Every day | ORAL | Status: DC
  Administered 2014-06-23: 10 mg via ORAL
  Filled 2014-06-22 (×3): qty 1

## 2014-06-22 MED ORDER — LACTATED RINGERS IV SOLN
INTRAVENOUS | Status: DC | PRN
  Administered 2014-06-22 (×2): via INTRAVENOUS

## 2014-06-22 MED ORDER — MIDAZOLAM HCL 2 MG/2ML IJ SOLN
INTRAMUSCULAR | Status: AC
  Filled 2014-06-22: qty 2

## 2014-06-22 MED ORDER — THYROID 60 MG PO TABS
90.0000 mg | ORAL_TABLET | Freq: Every day | ORAL | Status: DC
  Administered 2014-06-23: 90 mg via ORAL
  Filled 2014-06-22 (×2): qty 1

## 2014-06-22 MED ORDER — ACETAMINOPHEN 325 MG PO TABS: 650.0000 mg | ORAL_TABLET | Freq: Four times a day (QID) | ORAL | Status: DC | PRN

## 2014-06-22 MED ORDER — PROPOFOL 10 MG/ML IV BOLUS
INTRAVENOUS | Status: AC
  Filled 2014-06-22: qty 20

## 2014-06-22 MED ORDER — BUPIVACAINE LIPOSOME 1.3 % IJ SUSP
20.0000 mL | INTRAMUSCULAR | Status: DC
  Filled 2014-06-22: qty 20

## 2014-06-22 MED ORDER — BISACODYL 10 MG RE SUPP: 10.0000 mg | Freq: Every day | RECTAL | Status: DC | PRN

## 2014-06-22 MED ORDER — HYDROMORPHONE HCL 1 MG/ML IJ SOLN
INTRAMUSCULAR | Status: AC
  Filled 2014-06-22: qty 1

## 2014-06-22 MED ORDER — DEXAMETHASONE SODIUM PHOSPHATE 10 MG/ML IJ SOLN
10.0000 mg | Freq: Once | INTRAMUSCULAR | Status: AC
  Administered 2014-06-23: 10 mg via INTRAVENOUS
  Filled 2014-06-22: qty 1

## 2014-06-22 MED ORDER — MENTHOL 3 MG MT LOZG: 1.0000 | LOZENGE | OROMUCOSAL | Status: DC | PRN

## 2014-06-22 MED ORDER — SULFAMETHOXAZOLE-TRIMETHOPRIM 800-160 MG PO TABS: 1.0000 | ORAL_TABLET | Freq: Two times a day (BID) | ORAL | Status: DC

## 2014-06-22 MED ORDER — METHOCARBAMOL 500 MG PO TABS
500.0000 mg | ORAL_TABLET | Freq: Four times a day (QID) | ORAL | Status: DC | PRN
  Administered 2014-06-22 – 2014-06-24 (×5): 500 mg via ORAL
  Filled 2014-06-22 (×6): qty 1

## 2014-06-22 MED ORDER — FENTANYL CITRATE 0.05 MG/ML IJ SOLN
INTRAMUSCULAR | Status: AC
  Filled 2014-06-22: qty 5

## 2014-06-22 MED ORDER — GENTAMICIN IN SALINE 1.6-0.9 MG/ML-% IV SOLN
80.0000 mg | Freq: Once | INTRAVENOUS | Status: AC
  Administered 2014-06-22: 80 mg via INTRAVENOUS
  Filled 2014-06-22: qty 50

## 2014-06-22 MED ORDER — BUPIVACAINE HCL (PF) 0.25 % IJ SOLN
INTRAMUSCULAR | Status: AC
Start: 2014-06-22 — End: 2014-06-22
  Filled 2014-06-22: qty 30

## 2014-06-22 MED ORDER — FENTANYL CITRATE 0.05 MG/ML IJ SOLN
INTRAMUSCULAR | Status: DC | PRN
  Administered 2014-06-22: 50 ug via INTRAVENOUS
  Administered 2014-06-22: 25 ug via INTRAVENOUS

## 2014-06-22 MED ORDER — DOCUSATE SODIUM 100 MG PO CAPS
100.0000 mg | ORAL_CAPSULE | Freq: Two times a day (BID) | ORAL | Status: DC
  Administered 2014-06-22 – 2014-06-23 (×3): 100 mg via ORAL
  Filled 2014-06-22 (×5): qty 1

## 2014-06-22 MED ORDER — PREGNENOLONE POWD: 30.0000 mg | Freq: Every day | Status: DC

## 2014-06-22 MED ORDER — SUCCINYLCHOLINE CHLORIDE 20 MG/ML IJ SOLN
INTRAMUSCULAR | Status: AC
  Filled 2014-06-22: qty 1

## 2014-06-22 MED ORDER — SENNA-DOCUSATE SODIUM 8.6-50 MG PO TABS: 2.0000 | ORAL_TABLET | Freq: Every day | ORAL | Status: DC

## 2014-06-22 MED ORDER — POTASSIUM CHLORIDE IN NACL 20-0.45 MEQ/L-% IV SOLN
INTRAVENOUS | Status: DC
  Administered 2014-06-22: 16:00:00 via INTRAVENOUS
  Filled 2014-06-22 (×5): qty 1000

## 2014-06-22 MED ORDER — KETOROLAC TROMETHAMINE 15 MG/ML IJ SOLN
7.5000 mg | Freq: Four times a day (QID) | INTRAMUSCULAR | Status: AC
  Administered 2014-06-22 – 2014-06-23 (×4): 7.5 mg via INTRAVENOUS
  Filled 2014-06-22 (×4): qty 1

## 2014-06-22 MED ORDER — SENNA 8.6 MG PO TABS
1.0000 | ORAL_TABLET | Freq: Two times a day (BID) | ORAL | Status: DC
  Administered 2014-06-22 – 2014-06-23 (×3): 8.6 mg via ORAL
  Filled 2014-06-22 (×6): qty 1

## 2014-06-22 MED ORDER — MIDAZOLAM HCL 5 MG/5ML IJ SOLN
INTRAMUSCULAR | Status: DC | PRN
  Administered 2014-06-22: 2 mg via INTRAVENOUS

## 2014-06-22 MED ORDER — PROMETHAZINE HCL 25 MG/ML IJ SOLN: 6.2500 mg | INTRAMUSCULAR | Status: DC | PRN

## 2014-06-22 MED ORDER — BUPIVACAINE IN DEXTROSE 0.75-8.25 % IT SOLN
INTRATHECAL | Status: DC | PRN
  Administered 2014-06-22: 12 mg via INTRATHECAL

## 2014-06-22 MED ORDER — ROCURONIUM BROMIDE 50 MG/5ML IV SOLN
INTRAVENOUS | Status: AC
  Filled 2014-06-22: qty 1

## 2014-06-22 MED ORDER — METHOCARBAMOL 1000 MG/10ML IJ SOLN
500.0000 mg | Freq: Four times a day (QID) | INTRAVENOUS | Status: DC | PRN
  Filled 2014-06-22: qty 5

## 2014-06-22 MED ORDER — METOCLOPRAMIDE HCL 10 MG PO TABS: 5.0000 mg | ORAL_TABLET | Freq: Three times a day (TID) | ORAL | Status: DC | PRN

## 2014-06-22 MED ORDER — ADULT MULTIVITAMIN W/MINERALS CH
1.0000 | ORAL_TABLET | Freq: Two times a day (BID) | ORAL | Status: DC
Start: 2014-06-22 — End: 2014-06-24
  Administered 2014-06-22 – 2014-06-23 (×3): 1 via ORAL
  Filled 2014-06-22 (×7): qty 1

## 2014-06-22 MED ORDER — MAGNESIUM CITRATE PO SOLN: 1.0000 | Freq: Once | ORAL | Status: AC | PRN

## 2014-06-22 MED ORDER — TESTOSTERONE 50 MG/5GM (1%) TD GEL: 5.0000 g | Freq: Every day | TRANSDERMAL | Status: DC

## 2014-06-22 MED ORDER — PHENYLEPHRINE 40 MCG/ML (10ML) SYRINGE FOR IV PUSH (FOR BLOOD PRESSURE SUPPORT)
PREFILLED_SYRINGE | INTRAVENOUS | Status: AC
  Filled 2014-06-22: qty 10

## 2014-06-22 MED ORDER — ONDANSETRON HCL 4 MG PO TABS: 4.0000 mg | ORAL_TABLET | Freq: Three times a day (TID) | ORAL | Status: DC | PRN

## 2014-06-22 MED ORDER — HYDROMORPHONE HCL 1 MG/ML IJ SOLN
1.0000 mg | INTRAMUSCULAR | Status: DC | PRN
  Administered 2014-06-22 – 2014-06-24 (×10): 1 mg via INTRAVENOUS
  Filled 2014-06-22 (×10): qty 1

## 2014-06-22 MED ORDER — OXYCODONE HCL 5 MG PO TABS: 5.0000 mg | ORAL_TABLET | Freq: Once | ORAL | Status: DC | PRN

## 2014-06-22 MED ORDER — OXYCODONE-ACETAMINOPHEN 10-325 MG PO TABS: 1.0000 | ORAL_TABLET | Freq: Four times a day (QID) | ORAL | Status: DC | PRN

## 2014-06-22 MED ORDER — PROPOFOL INFUSION 10 MG/ML OPTIME
INTRAVENOUS | Status: DC | PRN
  Administered 2014-06-22: 75 ug/kg/min via INTRAVENOUS

## 2014-06-22 MED ORDER — ONDANSETRON HCL 4 MG/2ML IJ SOLN: 4.0000 mg | Freq: Four times a day (QID) | INTRAMUSCULAR | Status: DC | PRN

## 2014-06-22 MED ORDER — ONDANSETRON HCL 4 MG/2ML IJ SOLN
INTRAMUSCULAR | Status: DC | PRN
  Administered 2014-06-22: 4 mg via INTRAVENOUS

## 2014-06-22 MED ORDER — ACETAMINOPHEN 650 MG RE SUPP: 650.0000 mg | Freq: Four times a day (QID) | RECTAL | Status: DC | PRN

## 2014-06-22 MED ORDER — HYDROMORPHONE HCL 1 MG/ML IJ SOLN
INTRAMUSCULAR | Status: AC
Start: 2014-06-22 — End: 2014-06-23
  Filled 2014-06-22: qty 1

## 2014-06-22 MED ORDER — SODIUM CHLORIDE 0.9 % IJ SOLN
INTRAMUSCULAR | Status: DC | PRN
  Administered 2014-06-22: 40 mL

## 2014-06-22 MED ORDER — TAMSULOSIN HCL 0.4 MG PO CAPS: 0.4000 mg | ORAL_CAPSULE | Freq: Every day | ORAL | Status: DC

## 2014-06-22 MED ORDER — ONDANSETRON HCL 4 MG PO TABS: 4.0000 mg | ORAL_TABLET | Freq: Four times a day (QID) | ORAL | Status: DC | PRN

## 2014-06-22 MED ORDER — ONDANSETRON HCL 4 MG/2ML IJ SOLN
INTRAMUSCULAR | Status: AC
  Filled 2014-06-22: qty 2

## 2014-06-22 MED ORDER — KETOROLAC TROMETHAMINE 15 MG/ML IJ SOLN
INTRAMUSCULAR | Status: AC
  Filled 2014-06-22: qty 1

## 2014-06-22 MED ORDER — POLYETHYLENE GLYCOL 3350 17 G PO PACK: 17.0000 g | PACK | Freq: Every day | ORAL | Status: DC | PRN

## 2014-06-22 MED ORDER — BUPIVACAINE LIPOSOME 1.3 % IJ SUSP
INTRAMUSCULAR | Status: DC | PRN
  Administered 2014-06-22: 20 mL

## 2014-06-22 MED ORDER — LIDOCAINE HCL (CARDIAC) 20 MG/ML IV SOLN
INTRAVENOUS | Status: AC
  Filled 2014-06-22: qty 5

## 2014-06-22 MED ORDER — CEFAZOLIN SODIUM-DEXTROSE 2-3 GM-% IV SOLR
INTRAVENOUS | Status: AC
  Administered 2014-06-22: 2 g via INTRAVENOUS
  Filled 2014-06-22: qty 50

## 2014-06-22 MED ORDER — OXYCODONE HCL 5 MG PO TABS
5.0000 mg | ORAL_TABLET | ORAL | Status: DC | PRN
  Administered 2014-06-22 – 2014-06-24 (×10): 10 mg via ORAL
  Filled 2014-06-22 (×10): qty 2

## 2014-06-22 MED ORDER — EPHEDRINE SULFATE 50 MG/ML IJ SOLN
INTRAMUSCULAR | Status: AC
Start: 2014-06-22 — End: 2014-06-22
  Filled 2014-06-22: qty 1

## 2014-06-22 MED ORDER — ALUM & MAG HYDROXIDE-SIMETH 200-200-20 MG/5ML PO SUSP: 30.0000 mL | ORAL | Status: DC | PRN

## 2014-06-22 MED ORDER — PHENOL 1.4 % MT LIQD: 1.0000 | OROMUCOSAL | Status: DC | PRN

## 2014-06-22 MED ORDER — RIVAROXABAN 10 MG PO TABS: 10.0000 mg | ORAL_TABLET | Freq: Every day | ORAL | Status: DC

## 2014-06-22 MED ORDER — HYDROMORPHONE HCL 1 MG/ML IJ SOLN
0.2500 mg | INTRAMUSCULAR | Status: DC | PRN
  Administered 2014-06-22: 0.5 mg via INTRAVENOUS
  Administered 2014-06-22: 0.25 mg via INTRAVENOUS
  Administered 2014-06-22: 0.5 mg via INTRAVENOUS
  Administered 2014-06-22: 0.25 mg via INTRAVENOUS

## 2014-06-22 MED ORDER — CEFAZOLIN SODIUM-DEXTROSE 2-3 GM-% IV SOLR
2.0000 g | Freq: Four times a day (QID) | INTRAVENOUS | Status: AC
  Administered 2014-06-22 (×2): 2 g via INTRAVENOUS
  Filled 2014-06-22 (×2): qty 50

## 2014-06-22 MED ORDER — METOCLOPRAMIDE HCL 5 MG/ML IJ SOLN: 5.0000 mg | Freq: Three times a day (TID) | INTRAMUSCULAR | Status: DC | PRN

## 2014-06-22 MED ORDER — VITAMIN C 500 MG PO TABS
500.0000 mg | ORAL_TABLET | Freq: Two times a day (BID) | ORAL | Status: DC
  Administered 2014-06-22 – 2014-06-23 (×3): 500 mg via ORAL
  Filled 2014-06-22 (×7): qty 1

## 2014-06-22 MED ORDER — DIPHENHYDRAMINE HCL 12.5 MG/5ML PO ELIX: 12.5000 mg | ORAL_SOLUTION | ORAL | Status: DC | PRN

## 2014-06-22 MED ORDER — OXYCODONE HCL 5 MG/5ML PO SOLN: 5.0000 mg | Freq: Once | ORAL | Status: DC | PRN

## 2014-06-22 SURGICAL SUPPLY — 69 items
BANDAGE ELASTIC 4 VELCRO ST LF (GAUZE/BANDAGES/DRESSINGS) ×2 IMPLANT
BANDAGE ELASTIC 6 VELCRO ST LF (GAUZE/BANDAGES/DRESSINGS) ×2 IMPLANT
BANDAGE ESMARK 6X9 LF (GAUZE/BANDAGES/DRESSINGS) ×1 IMPLANT
BENZOIN TINCTURE PRP APPL 2/3 (GAUZE/BANDAGES/DRESSINGS) ×2 IMPLANT
BLADE SAG 18X100X1.27 (BLADE) ×2 IMPLANT
BLADE SAW RECIP 87.9 MT (BLADE) ×2 IMPLANT
BLADE SAW SGTL 13X75X1.27 (BLADE) ×2 IMPLANT
BNDG ESMARK 6X9 LF (GAUZE/BANDAGES/DRESSINGS) ×2
BOOTCOVER CLEANROOM LRG (PROTECTIVE WEAR) ×4 IMPLANT
BOWL SMART MIX CTS (DISPOSABLE) ×2 IMPLANT
CAP KNEE TOTAL 3 SIGMA ×2 IMPLANT
CEMENT HV SMART SET (Cement) ×4 IMPLANT
CLSR STERI-STRIP ANTIMIC 1/2X4 (GAUZE/BANDAGES/DRESSINGS) ×2 IMPLANT
COVER SURGICAL LIGHT HANDLE (MISCELLANEOUS) ×2 IMPLANT
CUFF TOURNIQUET SINGLE 34IN LL (TOURNIQUET CUFF) ×2 IMPLANT
DRAPE EXTREMITY T 121X128X90 (DRAPE) ×2 IMPLANT
DRAPE IMP U-DRAPE 54X76 (DRAPES) ×2 IMPLANT
DRAPE PROXIMA HALF (DRAPES) IMPLANT
DRAPE U-SHAPE 47X51 STRL (DRAPES) ×2 IMPLANT
DURAPREP 26ML APPLICATOR (WOUND CARE) ×2 IMPLANT
ELECT CAUTERY BLADE 6.4 (BLADE) ×2 IMPLANT
ELECT REM PT RETURN 9FT ADLT (ELECTROSURGICAL) ×2
ELECTRODE REM PT RTRN 9FT ADLT (ELECTROSURGICAL) ×1 IMPLANT
FACESHIELD WRAPAROUND (MASK) ×2 IMPLANT
GAUZE SPONGE 4X4 12PLY STRL (GAUZE/BANDAGES/DRESSINGS) ×2 IMPLANT
GLOVE BIOGEL PI IND STRL 7.0 (GLOVE) ×3 IMPLANT
GLOVE BIOGEL PI IND STRL 8 (GLOVE) ×1 IMPLANT
GLOVE BIOGEL PI INDICATOR 7.0 (GLOVE) ×3
GLOVE BIOGEL PI INDICATOR 8 (GLOVE) ×1
GLOVE BIOGEL PI ORTHO PRO SZ8 (GLOVE) ×1
GLOVE ORTHO TXT STRL SZ7.5 (GLOVE) ×2 IMPLANT
GLOVE PI ORTHO PRO STRL SZ8 (GLOVE) ×1 IMPLANT
GLOVE SURG ORTHO 8.0 STRL STRW (GLOVE) ×2 IMPLANT
GLOVE SURG SS PI 6.5 STRL IVOR (GLOVE) ×2 IMPLANT
GLOVE SURG SS PI 7.0 STRL IVOR (GLOVE) ×2 IMPLANT
GOWN STRL REUS W/ TWL XL LVL3 (GOWN DISPOSABLE) ×1 IMPLANT
GOWN STRL REUS W/TWL 2XL LVL3 (GOWN DISPOSABLE) ×2 IMPLANT
GOWN STRL REUS W/TWL XL LVL3 (GOWN DISPOSABLE) ×1
HANDPIECE INTERPULSE COAX TIP (DISPOSABLE) ×1
HOOD PEEL AWAY FACE SHEILD DIS (HOOD) ×6 IMPLANT
IMMOBILIZER KNEE 22 (SOFTGOODS) ×2 IMPLANT
IMMOBILIZER KNEE 22 UNIV (SOFTGOODS) ×2 IMPLANT
KIT BASIN OR (CUSTOM PROCEDURE TRAY) ×2 IMPLANT
KIT ROOM TURNOVER OR (KITS) ×2 IMPLANT
MANIFOLD NEPTUNE II (INSTRUMENTS) ×2 IMPLANT
NEEDLE 18GX1X1/2 (RX/OR ONLY) (NEEDLE) ×2 IMPLANT
NS IRRIG 1000ML POUR BTL (IV SOLUTION) ×2 IMPLANT
PACK TOTAL JOINT (CUSTOM PROCEDURE TRAY) ×2 IMPLANT
PACK UNIVERSAL I (CUSTOM PROCEDURE TRAY) ×2 IMPLANT
PAD ABD 8X10 STRL (GAUZE/BANDAGES/DRESSINGS) ×2 IMPLANT
PAD ARMBOARD 7.5X6 YLW CONV (MISCELLANEOUS) ×4 IMPLANT
PAD CAST 4YDX4 CTTN HI CHSV (CAST SUPPLIES) ×1 IMPLANT
PADDING CAST COTTON 4X4 STRL (CAST SUPPLIES) ×1
PADDING CAST COTTON 6X4 STRL (CAST SUPPLIES) ×2 IMPLANT
SET HNDPC FAN SPRY TIP SCT (DISPOSABLE) ×1 IMPLANT
SPONGE GAUZE 4X4 12PLY STER LF (GAUZE/BANDAGES/DRESSINGS) ×2 IMPLANT
SUCTION FRAZIER TIP 10 FR DISP (SUCTIONS) ×2 IMPLANT
SUT MNCRL AB 4-0 PS2 18 (SUTURE) ×2 IMPLANT
SUT VIC AB 0 CT1 27 (SUTURE) ×1
SUT VIC AB 0 CT1 27XBRD ANBCTR (SUTURE) ×1 IMPLANT
SUT VIC AB 2-0 CT1 27 (SUTURE) ×1
SUT VIC AB 2-0 CT1 TAPERPNT 27 (SUTURE) ×1 IMPLANT
SUT VIC AB 3-0 SH 8-18 (SUTURE) ×4 IMPLANT
SYR 30ML LL (SYRINGE) ×2 IMPLANT
SYRINGE REG LEUR 60CC (SYRINGE) ×2 IMPLANT
TOWEL OR 17X24 6PK STRL BLUE (TOWEL DISPOSABLE) ×2 IMPLANT
TOWEL OR 17X26 10 PK STRL BLUE (TOWEL DISPOSABLE) ×2 IMPLANT
TRAY CATH 16FR W/PLASTIC CATH (SET/KITS/TRAYS/PACK) ×2 IMPLANT
WATER STERILE IRR 1000ML POUR (IV SOLUTION) ×4 IMPLANT

## 2014-06-22 NOTE — Op Note (Addendum)
DATE OF SURGERY:  06/22/2014 TIME: 10:03 AM  PATIENT NAME:  Tony Luna   AGE: 74 y.o.    PRE-OPERATIVE DIAGNOSIS:  Left primary osteoarthritis, valgus  POST-OPERATIVE DIAGNOSIS:  Same  PROCEDURE:  Procedure(s): Left TOTAL KNEE ARTHROPLASTY   SURGEON:  [name], MD   ASSISTANT:  Joya Gaskins, OPA-C, present and scrubbed throughout the case, critical for assistance with exposure, retraction, instrumentation, and closure.   OPERATIVE IMPLANTS: Depuy PFC Sigma, Posterior Stabilized.  Femur size 5, Tibia size 5, Patella size 41 3-peg oval button, with a 10 mm polyethylene insert.  ANESTHESIA: Spinal with exparel and I/O cath at the completion of the case.  PREOPERATIVE INDICATIONS:  Tony Luna is a 74 y.o. year old male with end stage bone on bone degenerative arthritis of the knee who failed conservative treatment, including injections, antiinflammatories, activity modification, and assistive devices, and had significant impairment of their activities of daily living, and elected for Total Knee Arthroplasty. He had previous open knee arthrotomy done 50 years ago, unclear indication.  The risks, benefits, and alternatives were discussed at length including but not limited to the risks of infection, bleeding, nerve injury, stiffness, blood clots, the need for revision surgery, cardiopulmonary complications, among others, and they were willing to proceed.  OPERATIVE FINDINGS AND UNIQUE ASPECTS OF THE CASE:  My distal femoral resection was set at 10 mm, 5. I had to cut the tibia twice, my first resection was very conservative and he still had cupping on the lateral tibia that was not resected on the first pass. The patella measured 25 mm before resection, and I had to cut the patella twice, because my first resection left the patella and trial button somewhat proud, and I ended up with a 12.5 mm thickness patella with a 41 mm button that tracked extremely well. There was endstage  grade 4 chondral changes throughout the knee with severe valgus.  OPERATIVE DESCRIPTION:  The patient was brought to the operative room and placed in a supine position.  Spinal anesthesia was administered.  IV antibiotics were given.  The lower extremity was prepped and draped in the usual sterile fashion.  Time out was performed.  The leg was elevated and exsanguinated and the tourniquet was inflated.  Anterior quadriceps tendon splitting approach was performed.  The patella was everted and osteophytes were removed.  The anterior horn of the medial and lateral meniscus was removed.   The distal femur was opened with the drill and the intramedullary distal femoral cutting jig was utilized, set at 5 degrees resecting 10 mm off the distal femur.  Care was taken to protect the collateral ligaments.  Then the extramedullary tibial cutting jig was utilized making the appropriate cut using the anterior tibial crest as a reference building in appropriate posterior slope.  Care was taken during the cut to protect the medial and collateral ligaments.  The proximal tibia was removed along with the posterior horns of the menisci.  The PCL was sacrificed.    Initially, my extensor gap was only 8, and so I had to go back and cut a second cut. The extensor gap was measured and was approximately 28mm.    The distal femoral sizing jig was applied, taking care to avoid notching.  Then the 4-in-1 cutting jig was applied and the anterior and posterior femur was cut, along with the chamfer cuts.  I placed the distal femoral cutting jig in slight external rotation, as there was significant lateral femoral condyle  hypoplasia.  All posterior osteophytes were removed.  The flexion gap was then measured and was symmetric with the extension gap.  I completed the distal femoral preparation using the appropriate jig to prepare the box.  The patella was then measured, and cut with the saw.  The thickness before the cut was 25  and after the cut was 15, and trial with the 41 mm button had some prominence of the patella, and the tension felt somewhat off, so I freehanded a second cut removing an additional 2 mm of bone in order to allow for an anatomic reconstruction of the patellar thickness..  The proximal tibia sized and prepared accordingly with the reamer and the punch, and then all components were trialed with the 57mm poly insert.  The knee was found to have excellent balance and full motion.    The above named components were then cemented into place and all excess cement was removed.  The real polyethylene implant was placed.  After the cement had cured I released the tourniquet and confirmed excellent hemostasis with no major posterior vessel injury.    The knee was easily taken through a range of motion and the patella tracked well and the knee irrigated copiously and the parapatellar and subcutaneous tissue closed with vicryl, and monocryl with steri strips for the skin.  The wounds were injected with marcaine, and dressed with sterile gauze and the patient was awakened and returned to the PACU in stable and satisfactory condition.  There were no complications.  Total tourniquet time was 90 minutes.

## 2014-06-22 NOTE — Evaluation (Signed)
Physical Therapy Evaluation Patient Details Name: Tony Luna MRN: [id number] DOB: 21-Jul-1940 Today's Date: 06/22/2014   History of Present Illness  Patient is a 74 y/o male s/p L TKA.   Clinical Impression  Patient presents with post surgical deficits LLE s/p above surgery. Pt tolerated short distance ambulation to bathroom with Min A for balance. Education provided on HEP and precautions. Pt limited by pain. Pt would benefit from skilled PT to improve transfers, gait, balance and mobility so pt can maximize independence and return to PLOF.    Follow Up Recommendations Home health PT;Supervision/Assistance - 24 hour    Equipment Recommendations  Rolling walker with 5" wheels    Recommendations for Other Services       Precautions / Restrictions Precautions Precautions: Knee;Fall Precaution Booklet Issued: Yes (comment) Precaution Comments: Reviewed precautions and HEP. Required Braces or Orthoses: Knee Immobilizer - Left Knee Immobilizer - Left: On when out of bed or walking Restrictions Weight Bearing Restrictions: Yes LLE Weight Bearing: Weight bearing as tolerated      Mobility  Bed Mobility Overal bed mobility: Needs Assistance Bed Mobility: Supine to Sit;Sit to Supine     Supine to sit: Supervision Sit to supine: Supervision   General bed mobility comments: Supervision for safety. Cues for technique but no assist needed. No rails.  Transfers Overall transfer level: Needs assistance Equipment used: Rolling walker (2 wheeled) Transfers: Sit to/from Stand Sit to Stand: Min assist         General transfer comment: Able to power up to standing with KI donned however required MIn A to regain balance due to unsteadiness in standing.   Ambulation/Gait Ambulation/Gait assistance: Min assist Ambulation Distance (Feet): 12 Feet (x2 bouts) Assistive device: Rolling walker (2 wheeled) Gait Pattern/deviations: Step-to pattern;Decreased step length - left;Decreased  stance time - left;Decreased stride length;Trunk flexed Gait velocity: decreased   General Gait Details: Ambulated to/from bathroom with cues for increased WB through BUEs to assist with pain. Increased pain with ambulation. Mildly unsteady. Not able to urinate. RN notified.   Stairs            Wheelchair Mobility    Modified Rankin (Stroke Patients Only)       Balance Overall balance assessment: Needs assistance Sitting-balance support: Feet supported;No upper extremity supported Sitting balance-Leahy Scale: Good     Standing balance support: During functional activity Standing balance-Leahy Scale: Fair                               Pertinent Vitals/Pain Pain Assessment: 0-10 Pain Score: 7  Pain Location: left knee Pain Descriptors / Indicators: Sore;Aching Pain Intervention(s): Monitored during session;Premedicated before session;Repositioned    Home Living Family/patient expects to be discharged to:: Private residence Living Arrangements: Spouse/significant other Available Help at Discharge: Family Type of Home: House Home Access: Stairs to enter Entrance Stairs-Rails: Right Entrance Stairs-Number of Steps: 2 Home Layout: One level Home Equipment: Cane - single point;Crutches      Prior Function Level of Independence: Independent         Comments: Pt wore knee brace prior to surgery. Very active. Goes to gym daily.     Hand Dominance        Extremity/Trunk Assessment   Upper Extremity Assessment: Defer to OT evaluation           Lower Extremity Assessment: LLE deficits/detail;Generalized weakness   LLE Deficits / Details: Pt in KI. ANkle AROM WFL.  Reports mild diminished light touch sensation. Able to perform SLR with knee extension lag with KI donned.      Communication   Communication: No difficulties  Cognition Arousal/Alertness: Awake/alert Behavior During Therapy: WFL for tasks assessed/performed Overall Cognitive  Status: Within Functional Limits for tasks assessed                      General Comments General comments (skin integrity, edema, etc.): Education provided on HEP and importance of sitting up in chair for all meals.    Exercises Total Joint Exercises Ankle Circles/Pumps: Both;15 reps;Seated Quad Sets: Both;5 reps;Seated Gluteal Sets: Both;5 reps;Seated      Assessment/Plan    PT Assessment Patient needs continued PT services  PT Diagnosis Difficulty walking;Acute pain;Generalized weakness   PT Problem List Decreased strength;Pain;Decreased range of motion;Impaired sensation;Decreased balance;Decreased mobility;Decreased safety awareness;Decreased knowledge of precautions  PT Treatment Interventions Balance training;Gait training;Stair training;Patient/family education;Therapeutic activities;Therapeutic exercise;Functional mobility training   PT Goals (Current goals can be found in the Care Plan section) Acute Rehab PT Goals Patient Stated Goal: none stated PT Goal Formulation: With patient Time For Goal Achievement: 07/06/14 Potential to Achieve Goals: Fair    Frequency 7X/week   Barriers to discharge        Co-evaluation               End of Session Equipment Utilized During Treatment: Gait belt;Left knee immobilizer Activity Tolerance: Patient limited by pain;Patient tolerated treatment well Patient left: in bed;with call bell/phone within reach;with bed alarm set;with family/visitor present Nurse Communication: Mobility status         Time: 8250-5397 PT Time Calculation (min) (ACUTE ONLY): 23 min   Charges:   PT Evaluation $Initial PT Evaluation Tier I: 1 Procedure PT Treatments $Gait Training: 8-22 mins   PT G CodesCandy Sledge A Jul 21, 2014, 4:26 PM  Candy Sledge, [place], DPT [phone]

## 2014-06-22 NOTE — Plan of Care (Signed)
Problem: Consults Goal: Diagnosis- Total Joint Replacement Primary Total Knee Left     

## 2014-06-22 NOTE — Progress Notes (Signed)
Dr Tresa Moore, Urologist called to advise ok to transfer patient to room and he would come late this afternoon to complete urology consult.  This information was given to Dr Mardelle Matte via [place] nurse.

## 2014-06-22 NOTE — Anesthesia Preprocedure Evaluation (Addendum)
Anesthesia Evaluation  Patient identified by MRN, date of birth, ID band Patient awake    Reviewed: Allergy & Precautions, NPO status , Patient's Chart, lab work & pertinent test results  History of Anesthesia Complications (+) history of anesthetic complications  Airway Mallampati: II  TM Distance: >3 FB Neck ROM: Full    Dental  (+) Dental Advisory Given, Teeth Intact   Pulmonary former smoker,    Pulmonary exam normal       Cardiovascular negative cardio ROS      Neuro/Psych negative neurological ROS  negative psych ROS   GI/Hepatic negative GI ROS, Neg liver ROS,   Endo/Other  Hypothyroidism   Renal/GU negative Renal ROS     Musculoskeletal  (+) Arthritis -, Osteoarthritis,    Abdominal   Peds  Hematology   Anesthesia Other Findings   Reproductive/Obstetrics                           Anesthesia Physical Anesthesia Plan  ASA: II  Anesthesia Plan: MAC and Spinal   Post-op Pain Management:    Induction: Intravenous  Airway Management Planned: Simple Face Mask  Additional Equipment:   Intra-op Plan:   Post-operative Plan:   Informed Consent: I have reviewed the patients History and Physical, chart, labs and discussed the procedure including the risks, benefits and alternatives for the proposed anesthesia with the patient or authorized representative who has indicated his/her understanding and acceptance.   Dental advisory given  Plan Discussed with: CRNA, Anesthesiologist and Surgeon  Anesthesia Plan Comments:         Anesthesia Quick Evaluation

## 2014-06-22 NOTE — H&P (Signed)
PREOPERATIVE H&P  Chief Complaint: OA LEFT KNEE  HPI: Tony Luna is a 74 y.o. male who presents for preoperative history and physical with a diagnosis of OA LEFT KNEE. Symptoms are rated as moderate to severe, and have been worsening.  This is significantly impairing activities of daily living.  He has elected for surgical management. He has failed injections, activity modification, anti-inflammatories, and assistive devices.  Preoperative X-rays demonstrate end stage valgus degenerative changes with osteophyte formation, loss of joint space, subchondral sclerosis.   Past Medical History  Diagnosis Date  . Arthritis   . Allergy   . Esophageal tear   . Rotator cuff tear   . Complication of anesthesia     :"need catheter"  . Dysrhythmia     ? something told by reg dr  unable to say what it was  . Hypothyroidism   . Pneumonia     hx   Past Surgical History  Procedure Laterality Date  . Knee surgery      ? loose body/ chondral defect 3yrs  . Rotator cuff repair      left  . Tonsillectomy    . Spine surgery    . Hernia repair Left     inguinal herniorrhapy  . Cautery of turbinates    . Blepharaplasty     History   Social History  . Marital Status: Married    Spouse Name: N/A    Number of Children: 2  . Years of Education: N/A   Occupational History  . textiles    Social History Main Topics  . Smoking status: Former Smoker -- 1.00 packs/day for 15 years    Types: Cigarettes    Quit date: 06/15/1983  . Smokeless tobacco: Current User    Types: Chew  . Alcohol Use: No     Comment: quit 10-2007  . Drug Use: No  . Sexual Activity: None   Other Topics Concern  . None   Social History Narrative   [employer], retail   Very active   Walks 1 hour daily   History reviewed. No pertinent family history. No Known Allergies Prior to Admission medications   Medication Sig Start Date End Date Taking? Authorizing Provider  ARGININE PO Take 500 mg by mouth  2 (two) times daily.    Yes Historical Provider, MD  Bitter Melon POWD Take 1 tablet by mouth daily.   Yes Historical Provider, MD  Cholecalciferol (VITAMIN D-3 PO) Take 7,000 Units by mouth daily. 5,00000 u in the morning, 2,000 u in the evening   Yes Historical Provider, MD  MILK THISTLE PO Take 1 tablet by mouth 2 (two) times daily.   Yes Historical Provider, MD  Multiple Vitamin (MULTIVITAMIN WITH MINERALS) TABS tablet Take 1 tablet by mouth 2 (two) times daily.   Yes Historical Provider, MD  OVER THE COUNTER MEDICATION Take 4 sprays by mouth at bedtime. Secretropin oral spray - growth hormone   Yes Historical Provider, MD  OVER THE COUNTER MEDICATION Take 1 capsule by mouth 3 (three) times daily. Instaflex   Yes Historical Provider, MD  OVER THE COUNTER MEDICATION Take 1 tablet by mouth 2 (two) times daily. Healthy Prostrate   Yes Historical Provider, MD  OVER THE COUNTER MEDICATION Take 500 mg by mouth 2 (two) times daily. Turmeric with Meriva   Yes Historical Provider, MD  OVER THE COUNTER MEDICATION Take 1 tablet by mouth 2 (two) times daily. Omega Genic   Yes Historical Provider,  MD  OVER THE COUNTER MEDICATION Take 2 tablets by mouth daily. Bio Magnesium Citrate   Yes Historical Provider, MD  OVER THE COUNTER MEDICATION Take 1 tablet by mouth daily. Vitamin B12 - folate   Yes Historical Provider, MD  OVER THE COUNTER MEDICATION Take 10,000 mcg by mouth 2 (two) times daily. Vitamin B12-methylfolate   Yes Historical Provider, MD  OVER THE COUNTER MEDICATION Take 1 tablet by mouth daily. Pure adrenal 400   Yes Historical Provider, MD  Pregnenolone POWD Take 30 mg by mouth daily.   Yes Historical Provider, MD  TAURINE PO Take 1 tablet by mouth 2 (two) times daily.   Yes Historical Provider, MD  testosterone (ANDROGEL) 50 MG/5GM (1%) GEL Place 5 g onto the skin daily.   Yes Historical Provider, MD  thyroid (ARMOUR) 90 MG tablet Take 90 mg by mouth daily.   Yes Historical Provider, MD  vitamin  C (ASCORBIC ACID) 500 MG tablet Take 500 mg by mouth 2 (two) times daily.   Yes Historical Provider, MD     Positive ROS: All other systems have been reviewed and were otherwise negative with the exception of those mentioned in the HPI and as above.  Physical Exam: General: Alert, no acute distress Cardiovascular: No pedal edema Respiratory: No cyanosis, no use of accessory musculature GI: No organomegaly, abdomen is soft and non-tender Skin: No lesions in the area of chief complaint Neurologic: Sensation intact distally Psychiatric: Patient is competent for consent with normal mood and affect Lymphatic: No axillary or cervical lymphadenopathy  MUSCULOSKELETAL: left knee rom 5-125 degrees with valgus deformity and crepitance and pain with rom.  Assessment: LEFT KNEE primary osteoarthritis, valgus  Plan: Plan for Procedure(s): TOTAL KNEE ARTHROPLASTY  The risks benefits and alternatives were discussed with the patient including but not limited to the risks of nonoperative treatment, versus surgical intervention including infection, bleeding, nerve injury,  blood clots, cardiopulmonary complications, morbidity, mortality, among others, and they were willing to proceed.   [name], MD Cell [phone]   06/22/2014 7:46 AM

## 2014-06-22 NOTE — Progress Notes (Signed)
Patient has a history of probable BPH treated with OTC meds.    I/O cath after procedure unable to yield urine, with 16  [place] and out catheter. Blood encountered by the nurse placing the catheter, and in and out cath discontinued and removed.  Urology consult requested by myself.   [name], MD

## 2014-06-22 NOTE — Consult Note (Signed)
Reason for Consult: Difficult Catheter Placement  Referring Physician: Carter Kitten MD  Tony Luna is an 74 y.o. male.   HPI:   1 - Difficult Catheter Placement / Urethral False Pass - inabilty to I/O cath pt intraoperatively 06/22/14 during left total knee replacement under spinal. Has h/o difficult foley and post-op retention previously but minimal baseline voiding symptoms. Given initial trial of void post-op but then with uncomfortable bladder distension and bedside bladder scan 900cc. Cysto today with small posterior urethral false pass and enlarged prostate. No  h/o GU surgery. Cr 2016 pre-op 1.06. He had Kefzol intraoperatively.   2 - Enlarged Prostate - pt wih minimal baseline voiding symptoms. 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   PMH sig for hernia surgery, ortho surgeyr x several.  Today " Tony Luna " is seen in consultation for above. He is referred by Dr. Mardelle Matte.   Past Medical History  Diagnosis Date  . Arthritis   . Allergy   . Esophageal tear   . Rotator cuff tear   . Complication of anesthesia     :"need catheter"  . Dysrhythmia     ? something told by reg dr  unable to say what it was  . Hypothyroidism   . Pneumonia     hx  . Primary osteoarthritis of left knee 06/22/2014    Past Surgical History  Procedure Laterality Date  . Knee surgery      ? loose body/ chondral defect 80yrs  . Rotator cuff repair      left  . Tonsillectomy    . Spine surgery    . Hernia repair Left     inguinal herniorrhapy  . Cautery of turbinates    . Blepharaplasty      History reviewed. No pertinent family history.  Social History:  reports that he quit smoking about 31 years ago. His smoking use included Cigarettes. He has a 15 pack-year smoking history. His smokeless tobacco use includes Chew. He reports that he does not drink alcohol or use illicit drugs.  Allergies: No Known Allergies  Medications: I have reviewed the patient's  current medications.  No results found for this or any previous visit (from the past 48 hour(s)).  No results found.  Review of Systems  Constitutional: Negative for fever and chills.  HENT: Negative.   Eyes: Negative.   Respiratory: Negative.   Cardiovascular: Negative.   Gastrointestinal: Negative.   Genitourinary:       Sensation of full bladder  Musculoskeletal: Negative.   Skin: Negative.   Neurological: Negative.   Endo/Heme/Allergies: Negative.   Psychiatric/Behavioral: Negative.    Blood pressure 127/61, pulse 67, temperature 97.7 F (36.5 C), temperature source Oral, resp. rate 17, height 6' (1.829 m), weight 81.647 kg (180 lb), SpO2 100 %. Physical Exam  Constitutional: He is oriented to person, place, and time. He appears well-developed and well-nourished.  Wife at bedside  HENT:  Head: Normocephalic.  Eyes: Pupils are equal, round, and reactive to light.  Neck: Normal range of motion.  Cardiovascular: Normal rate.   Respiratory: Effort normal.  GI: Soft.  Genitourinary:  Sp TTP. Small blood at meatus.  Musculoskeletal:  New LE bandage / dressing c/w knee replacement.  Neurological: He is alert and oriented to person, place, and time.  Skin: Skin is warm and dry.  Psychiatric: He has a normal mood and affect. His behavior is normal. Judgment and thought content normal.   CYSTOSCOPY: Penis prepped asepticaly  with betadine, 10cc lidocaine jelly instilled per penis. Cystourethroscopy then performed with 68F flexibe cystoscope. Anterior urethra unremarkable. Partial posterior false pass at bulbar urethra just distal to membranous urethra / prostate. Prostatic urethra with large bilobar hypertrophy (estimate 100gm). Bladder trabeculated w/o masses or blood. Ureteral orifices anatomic. Sensor guidewire placed into bladder through scope and scope exchanged for 24F council catheter to straight drain, 10cc sterile water in balloon. Immediate efflux 1200cc clear yellow  urine and relief of low abdominal pain.   Assessment/Plan:  1 - Difficult Catheter Placement / Urethral False Pass - likely due to very large prostate which caused significant resistance enough to divert prior catheter through small posterior false pass. Rec keep current catheter x 3-7 days then repeat trial of void in Urology office. We will arrange. RX'd proph bactrim while catheter in. Will RX single dose Norva Karvonen now to help cover bedside procedure in setting of joint replacement.   2 - Enlarged Prostate -  rec begin tamsulosin daily to help with trial of void, may continue after voiding trial pending symptoms.   3 - Pt OK for DC and at anypoint with foley in place form GU perspective.    Myriah Boggus 06/22/2014, 11:31 AM

## 2014-06-22 NOTE — Anesthesia Procedure Notes (Addendum)
Procedure Name: MAC Date/Time: 06/22/2014 8:02 AM Performed by: Garrison Columbus T Pre-anesthesia Checklist: Patient identified, Emergency Drugs available, Suction available and Patient being monitored Patient Re-evaluated:Patient Re-evaluated prior to inductionOxygen Delivery Method: Simple face mask Preoxygenation: Pre-oxygenation with 100% oxygen Intubation Type: IV induction Placement Confirmation: positive ETCO2 and breath sounds checked- equal and bilateral Dental Injury: Teeth and Oropharynx as per pre-operative assessment    Spinal Patient location during procedure: holding area Start time: 06/22/2014 7:51 AM End time: 06/22/2014 8:01 AM Staffing Anesthesiologist: Duane Boston Performed by: anesthesiologist  Preanesthetic Checklist Completed: patient identified, surgical consent, pre-op evaluation, timeout performed, IV checked, risks and benefits discussed and monitors and equipment checked Spinal Block Patient position: sitting Prep: Betadine Patient monitoring: cardiac monitor, continuous pulse ox and blood pressure Approach: midline Injection technique: single-shot Needle Needle type: Pencan  Needle gauge: 24 G Needle length: 9 cm Additional Notes Functioning IV was confirmed and monitors were applied. Sterile prep and drape, including hand hygiene and sterile gloves were used. The patient was positioned and the spine was prepped. The skin was anesthetized with lidocaine.  Free flow of clear CSF was obtained prior to injecting local anesthetic into the CSF.  The spinal needle aspirated freely following injection.  The needle was carefully withdrawn.  The patient tolerated the procedure well.

## 2014-06-22 NOTE — Progress Notes (Signed)
Utilization review completed.  

## 2014-06-22 NOTE — Progress Notes (Signed)
Report given to emily rn as caregive

## 2014-06-22 NOTE — Transfer of Care (Signed)
Immediate Anesthesia Transfer of Care Note  Patient: Tony Luna  Procedure(s) Performed: Procedure(s): TOTAL KNEE ARTHROPLASTY (Left)  Patient Location: PACU  Anesthesia Type:MAC and Spinal  Level of Consciousness: awake, alert  and oriented  Airway & Oxygen Therapy: Patient Spontanous Breathing  Post-op Assessment: Report given to PACU RN, Post -op Vital signs reviewed and stable and Patient moving all extremities X 4  Post vital signs: Reviewed and stable  Complications: No apparent anesthesia complications

## 2014-06-22 NOTE — Discharge Instructions (Signed)
Diet: As you were doing prior to hospitalization   Shower:  May shower but keep the wounds dry, use an occlusive plastic wrap, NO SOAKING IN TUB.  If the bandage gets wet, change with a clean dry gauze.  Dressing:  You may change your dressing 3-5 days after surgery.  Then change the dressing daily with sterile gauze dressing.    There are sticky tapes (steri-strips) on your wounds and all the stitches are absorbable.  Leave the steri-strips in place when changing your dressings, they will peel off with time, usually 2-3 weeks.  Activity:  Increase activity slowly as tolerated, but follow the weight bearing instructions below.  No lifting or driving for 6 weeks.  Weight Bearing:   As tolerated.    To prevent constipation: you may use a stool softener such as -  Colace (over the counter) 100 mg by mouth twice a day  Drink plenty of fluids (prune juice may be helpful) and high fiber foods Miralax (over the counter) for constipation as needed.    Itching:  If you experience itching with your medications, try taking only a single pain pill, or even half a pain pill at a time.  You may take up to 10 pain pills per day, and you can also use benadryl over the counter for itching or also to help with sleep.   Precautions:  If you experience chest pain or shortness of breath - call 911 immediately for transfer to the hospital emergency department!!  If you develop a fever greater that 101 F, purulent drainage from wound, increased redness or drainage from wound, or calf pain -- Call the office at [phone]                                                Follow- Up Appointment:  Please call for an appointment to be seen in 2 weeks [place] - [phone]   Information on my medicine - XARELTO (Rivaroxaban)  This medication education was reviewed with me or my healthcare representative as part of my discharge preparation.  The pharmacist that spoke with me during my hospital stay was:  [place] Day,  Margot Chimes, [facility]  Why was Xarelto prescribed for you? Xarelto was prescribed for you to reduce the risk of blood clots forming after orthopedic surgery. The medical term for these abnormal blood clots is venous thromboembolism (VTE).  What do you need to know about xarelto ? Take your Xarelto ONCE DAILY at the same time every day. You may take it either with or without food.  If you have difficulty swallowing the tablet whole, you may crush it and mix in applesauce just prior to taking your dose.  Take Xarelto exactly as prescribed by your doctor and DO NOT stop taking Xarelto without talking to the doctor who prescribed the medication.  Stopping without other VTE prevention medication to take the place of Xarelto may increase your risk of developing a clot.  After discharge, you should have regular check-up appointments with your healthcare provider that is prescribing your Xarelto.    What do you do if you miss a dose? If you miss a dose, take it as soon as you remember on the same day then continue your regularly scheduled once daily regimen the next day. Do not take two doses of Xarelto on the same day.   Important  Safety Information A possible side effect of Xarelto is bleeding. You should call your healthcare provider right away if you experience any of the following: ? Bleeding from an injury or your nose that does not stop. ? Unusual colored urine (red or dark brown) or unusual colored stools (red or black). ? Unusual bruising for unknown reasons. ? A serious fall or if you hit your head (even if there is no bleeding).  Some medicines may interact with Xarelto and might increase your risk of bleeding while on Xarelto. To help avoid this, consult your healthcare provider or pharmacist prior to using any new prescription or non-prescription medications, including herbals, vitamins, non-steroidal anti-inflammatory drugs (NSAIDs) and supplements.  This website has  more information on Xarelto: [email or web address]

## 2014-06-22 NOTE — Anesthesia Postprocedure Evaluation (Signed)
Anesthesia Post Note  Patient: Tony Luna  Procedure(s) Performed: Procedure(s) (LRB): TOTAL KNEE ARTHROPLASTY (Left)  Anesthesia type: MAC/SAB  Patient location: PACU  Post pain: Pain level controlled  Post assessment: Patient's Cardiovascular Status Stable  Last Vitals:  Filed Vitals:   06/22/14 1115  BP: 127/61  Pulse: 67  Temp:   Resp: 17    Post vital signs: Reviewed and stable  Level of consciousness: sedated  Complications: No apparent anesthesia complications

## 2014-06-23 LAB — BASIC METABOLIC PANEL
Anion gap: 10 (ref 5–15)
BUN: 17 mg/dL (ref 6–23)
CO2: 25 mmol/L (ref 19–32)
Calcium: 8.1 mg/dL — ABNORMAL LOW (ref 8.4–10.5)
Chloride: 101 mmol/L (ref 96–112)
Creatinine, Ser: 1.04 mg/dL (ref 0.50–1.35)
GFR calc Af Amer: 81 mL/min — ABNORMAL LOW (ref >90)
GFR calc non Af Amer: 70 mL/min — ABNORMAL LOW (ref >90)
Glucose, Bld: 123 mg/dL — ABNORMAL HIGH (ref 70–99)
Potassium: 4 mmol/L (ref 3.5–5.1)
Sodium: 136 mmol/L (ref 135–145)

## 2014-06-23 LAB — CBC
HCT: 33.8 % — ABNORMAL LOW (ref 39.0–52.0)
Hemoglobin: 11.9 g/dL — ABNORMAL LOW (ref 13.0–17.0)
MCH: 30.5 pg (ref 26.0–34.0)
MCHC: 35.2 g/dL (ref 30.0–36.0)
MCV: 86.7 fL (ref 78.0–100.0)
Platelets: 121 10*3/uL — ABNORMAL LOW (ref 150–400)
RBC: 3.9 MIL/uL — ABNORMAL LOW (ref 4.22–5.81)
RDW: 12.6 % (ref 11.5–15.5)
WBC: 10 10*3/uL (ref 4.0–10.5)

## 2014-06-23 MED ORDER — LIDOCAINE HCL 2 % EX GEL
CUTANEOUS | Status: AC
  Filled 2014-06-23: qty 20

## 2014-06-23 MED ORDER — TAMSULOSIN HCL 0.4 MG PO CAPS
0.4000 mg | ORAL_CAPSULE | Freq: Every day | ORAL | Status: DC
  Administered 2014-06-23: 0.4 mg via ORAL
  Filled 2014-06-23 (×2): qty 1

## 2014-06-23 NOTE — Progress Notes (Signed)
Physical Therapy Treatment Patient Details Name: Tony Luna MRN: [id number] DOB: 05-29-1940 Today's Date: 06/23/2014    History of Present Illness Patient is a 74 y/o male s/p L TKA.     PT Comments    Pt. Ambulated further today and could tolerate it well. Pt. Would benefit from gradual mobility and exercises next session. MD discussed d/c within the next day or so to home pending pain and urinary problem resolving.   Follow Up Recommendations  Home health PT;Supervision/Assistance - 24 hour     Equipment Recommendations  Rolling walker with 5" wheels    Recommendations for Other Services       Precautions / Restrictions Precautions Precautions: Knee;Fall Precaution Booklet Issued: Yes (comment) Precaution Comments: Gave him some new exercises and will review HEP next session. Required Braces or Orthoses: Knee Immobilizer - Left Knee Immobilizer - Left: On when out of bed or walking Restrictions Weight Bearing Restrictions: Yes LLE Weight Bearing: Weight bearing as tolerated    Mobility  Bed Mobility Overal bed mobility: Needs Assistance Bed Mobility: Supine to Sit     Supine to sit: Supervision;HOB elevated     General bed mobility comments: Supervision for safety. Cues for technique but no assist needed. No rails.  Transfers Overall transfer level: Needs assistance Equipment used: Rolling walker (2 wheeled) Transfers: Sit to/from Stand Sit to Stand: Min assist         General transfer comment: Able to power up to standing with KI donned however required MIn A to regain balance due to unsteadiness in standing.   Ambulation/Gait Ambulation/Gait assistance: Min assist Ambulation Distance (Feet): 40 Feet Assistive device: Rolling walker (2 wheeled) Gait Pattern/deviations: Step-to pattern;Decreased stance time - left;Trunk flexed;Decreased step length - left;Decreased stride length Gait velocity: decreased Gait velocity interpretation: Below normal speed  for age/gender General Gait Details: Ambulated with cues for increased WB through [place]. PTA Fixed knee immobilizer in standing as it had slid down. Felt more support after fixing.   Stairs            Wheelchair Mobility    Modified Rankin (Stroke Patients Only)       Balance Overall balance assessment: Needs assistance Sitting-balance support: Feet supported;No upper extremity supported Sitting balance-Leahy Scale: Good     Standing balance support: During functional activity Standing balance-Leahy Scale: Fair                      Cognition Arousal/Alertness: Awake/alert Behavior During Therapy: WFL for tasks assessed/performed Overall Cognitive Status: Within Functional Limits for tasks assessed                      Exercises      General Comments        Pertinent Vitals/Pain Pain Score: 7  Pain Location: left knee Pain Descriptors / Indicators: Sore Pain Intervention(s): Premedicated before session;Monitored during session    Home Living                      Prior Function            PT Goals (current goals can now be found in the care plan section) Progress towards PT goals: Progressing toward goals    Frequency  7X/week    PT Plan Current plan remains appropriate    Co-evaluation             End of Session Equipment Utilized During Treatment: Gait belt;Left knee  immobilizer Activity Tolerance: Patient limited by pain;Patient tolerated treatment well Patient left: in chair;with family/visitor present;with call bell/phone within reach     Time: 0823-0855 PT Time Calculation (min) (ACUTE ONLY): 32 min  Charges:                       G Codes:      Jodi Geralds, SPTA 06/23/2014, 9:14 AM

## 2014-06-23 NOTE — Evaluation (Signed)
Occupational Therapy Evaluation Patient Details Name: Tony Luna MRN: [id number] DOB: 11/27/1940 Today's Date: 06/23/2014    History of Present Illness Patient is a 74 y/o male s/p L TKA.    Clinical Impression   PTA pt lived at home and was independent with ADLs and IADLs. Pt is very active. He is currently limited by pain and decreased L knee ROM which impair his independence with LB ADLs and functional mobility. Pt will benefit from acute OT to promote independence with ADLs.     Follow Up Recommendations  No OT follow up;Supervision/Assistance - 24 hour    Equipment Recommendations  3 in 1 bedside comode    Recommendations for Other Services       Precautions / Restrictions Precautions Precautions: Knee;Fall Precaution Comments: Reviewed precautions Required Braces or Orthoses: Knee Immobilizer - Left Knee Immobilizer - Left: On when out of bed or walking Restrictions Weight Bearing Restrictions: Yes LLE Weight Bearing: Weight bearing as tolerated      Mobility Bed Mobility               General bed mobility comments: Pt sitting up in recliner when OT arrived.   Transfers Overall transfer level: Needs assistance Equipment used: Rolling walker (2 wheeled) Transfers: Sit to/from Stand Sit to Stand: Supervision         General transfer comment: Supervision for safety. Good hand placement and technique.     Balance Overall balance assessment: Needs assistance Sitting-balance support: No upper extremity supported;Feet supported Sitting balance-Leahy Scale: Good     Standing balance support: Bilateral upper extremity supported;During functional activity Standing balance-Leahy Scale: Fair Standing balance comment: Pt able to maintain static balance without UE support however requires UE support for dynamic activities.                             ADL Overall ADL's : Needs assistance/impaired Eating/Feeding: Independent;Sitting   Grooming:  Supervision/safety;Standing   Upper Body Bathing: Set up;Sitting   Lower Body Bathing: Minimal assistance;Sit to/from stand   Upper Body Dressing : Set up;Sitting   Lower Body Dressing: Sit to/from stand;Minimal assistance Lower Body Dressing Details (indicate cue type and reason): for LLE only Toilet Transfer: Supervision/safety;Ambulation;RW Toilet Transfer Details (indicate cue type and reason): sit<>stand from recliner       Tub/Shower Transfer Details (indicate cue type and reason): Educated pt on safe shower transfer technique.  Functional mobility during ADLs: Supervision/safety;Rolling walker General ADL Comments: Pt reports that pain is beginning to get under control and is moving well. He will have assistance at home. Pt will be d/c with catheter bag and focused training and education on management of catheter bag during mobility and ADLs. Wife present for training.     Vision  Pt reports no change from baseline.                    Perception Perception Perception Tested?: No   Praxis      Pertinent Vitals/Pain Pain Assessment: 0-10 Pain Score: 5  Pain Location: Left Knee Pain Descriptors / Indicators: Aching;Sore Pain Intervention(s): Limited activity within patient's tolerance;Monitored during session;Repositioned     Hand Dominance     Extremity/Trunk Assessment Upper Extremity Assessment Upper Extremity Assessment: Overall WFL for tasks assessed   Lower Extremity Assessment Lower Extremity Assessment: Defer to PT evaluation   Cervical / Trunk Assessment Cervical / Trunk Assessment: Normal   Communication Communication Communication: No difficulties  Cognition Arousal/Alertness: Awake/alert Behavior During Therapy: WFL for tasks assessed/performed Overall Cognitive Status: Within Functional Limits for tasks assessed                                Home Living Family/patient expects to be discharged to:: Private  residence Living Arrangements: Spouse/significant other Available Help at Discharge: Family Type of Home: House Home Access: Stairs to enter Technical brewer of Steps: 2 Entrance Stairs-Rails: Right Home Layout: One level     Bathroom Shower/Tub: Walk-in shower         Home Equipment: Cane - single point;Crutches;Shower seat - built in          Prior Functioning/Environment Level of Independence: Independent        Comments: Pt wore knee brace prior to surgery. Very active. Goes to gym daily.    OT Diagnosis: Generalized weakness;Acute pain   OT Problem List: Decreased strength;Decreased range of motion;Decreased activity tolerance;Impaired balance (sitting and/or standing);Decreased knowledge of use of DME or AE;Decreased knowledge of precautions;Pain   OT Treatment/Interventions: Self-care/ADL training;Therapeutic exercise;Energy conservation;DME and/or AE instruction;Therapeutic activities;Patient/family education;Balance training    OT Goals(Current goals can be found in the care plan section) Acute Rehab OT Goals Patient Stated Goal: to know how to manage at home OT Goal Formulation: With patient/family Time For Goal Achievement: 07/07/14 Potential to Achieve Goals: Good ADL Goals Pt Will Perform Grooming: with modified independence;standing Pt Will Perform Lower Body Bathing: with set-up;with supervision;sit to/from stand Pt Will Perform Lower Body Dressing: with set-up;with supervision;sit to/from stand Pt Will Transfer to Toilet: with modified independence;ambulating;bedside commode  OT Frequency: Min 1X/week    End of Session Equipment Utilized During Treatment: Gait belt;Rolling walker;Left knee immobilizer  Activity Tolerance: Patient tolerated treatment well Patient left: in chair;with call bell/phone within reach;with family/visitor present   Time: 7282-0601 OT Time Calculation (min): 30 min Charges:  OT General Charges $OT Visit: 1  Procedure OT Evaluation $Initial OT Evaluation Tier I: 1 Procedure OT Treatments $Self Care/Home Management : 8-22 mins G-Codes:    Juluis Rainier 2014/07/04, 2:43 PM   Secundino Ginger Lynetta Mare, OTR/L Occupational Therapist [phone] (pager)

## 2014-06-23 NOTE — Progress Notes (Signed)
Patient ID: Tony Luna, male   DOB: 29-Jan-1941, 74 y.o.   MRN: [id number]     Subjective:  Patient reports pain as mild.  Patient sitting up in bed and denies any CP or SOB.  Patient very pleased with the care that he has received.  Objective:   VITALS:   Filed Vitals:   06/23/14 0000 06/23/14 0102 06/23/14 0400 06/23/14 0533  BP:  148/73  148/62  Pulse:  88  87  Temp:  98.7 F (37.1 C)  98.4 F (36.9 C)  TempSrc:    Oral  Resp: 18 18 18 17   Height:      Weight:      SpO2: 100% 100% 100% 100%    ABD soft Sensation intact distally Dorsiflexion/Plantar flexion intact Incision: dressing C/D/I and no drainage Good foot and ankle motion  Lab Results  Component Value Date   WBC 10.0 06/23/2014   HGB 11.9* 06/23/2014   HCT 33.8* 06/23/2014   MCV 86.7 06/23/2014   PLT 121* 06/23/2014   BMET    Component Value Date/Time   NA 136 06/23/2014 0545   K 4.0 06/23/2014 0545   CL 101 06/23/2014 0545   CO2 25 06/23/2014 0545   GLUCOSE 123* 06/23/2014 0545   BUN 17 06/23/2014 0545   CREATININE 1.04 06/23/2014 0545   CALCIUM 8.1* 06/23/2014 0545   GFRNONAA 70* 06/23/2014 0545   GFRAA 81* 06/23/2014 0545     Assessment/Plan: 1 Day Post-Op   Principal Problem:   Primary osteoarthritis of left knee Active Problems:   Knee osteoarthritis   Acute urinary retention   Advance diet Up with therapy Continue plan per urology and keep cath.in place Dry dressing PRN WBAT   Remonia Richter 06/23/2014, 7:36 AM  Seen and agree.  Possible dc thurs.  Marchia Bond, MD Cell [phone]

## 2014-06-23 NOTE — Progress Notes (Signed)
Physical Therapy Treatment Patient Details Name: Tony Luna MRN: [id number] DOB: [birth year] Today's Date: 06/23/2014    History of Present Illness Patient is a 74 y/o male s/p L TKA.     PT Comments    Pt. Is very pleasant and compliant with PT. Interested in doing exercises to gain strength. Pt would benefit from gait training and stair climbing before d/c to home. Pt. Ambulated further from first session and reported no increase in pain.  Follow Up Recommendations  Home health PT;Supervision/Assistance - 24 hour     Equipment Recommendations  Rolling walker with 5" wheels    Recommendations for Other Services       Precautions / Restrictions Precautions Precautions: Knee;Fall Precaution Comments: Reviewed precautions Required Braces or Orthoses: Knee Immobilizer - Left Knee Immobilizer - Left: On when out of bed or walking Restrictions Weight Bearing Restrictions: Yes LLE Weight Bearing: Weight bearing as tolerated    Mobility  Bed Mobility               General bed mobility comments: Pt sitting up in recliner when  arrived.   Transfers Overall transfer level: Needs assistance Equipment used: Rolling walker (2 wheeled) Transfers: Sit to/from Stand Sit to Stand: Min guard         General transfer comment: min g for safety and catheter management  Ambulation/Gait Ambulation/Gait assistance: Min guard Ambulation Distance (Feet): 60 Feet Assistive device: Rolling walker (2 wheeled) Gait Pattern/deviations: Step-through pattern;Decreased stance time - left;Decreased stride length;Decreased dorsiflexion - left Gait velocity: decreased Gait velocity interpretation: Below normal speed for age/gender General Gait Details: Ambulated with cues for increased WB through BUES. vc's to not supinate right foot.   Stairs            Wheelchair Mobility    Modified Rankin (Stroke Patients Only)       Balance Overall balance assessment: Needs  assistance Sitting-balance support: No upper extremity supported;Feet supported Sitting balance-Leahy Scale: Good     Standing balance support: Bilateral upper extremity supported;During functional activity Standing balance-Leahy Scale: Fair Standing balance comment: Pt able to maintain static balance without UE support however requires UE support for dynamic activities.                     Cognition Arousal/Alertness: Awake/alert Behavior During Therapy: WFL for tasks assessed/performed Overall Cognitive Status: Within Functional Limits for tasks assessed                      Exercises Total Joint Exercises Ankle Circles/Pumps: AROM;10 reps;Seated;Both Quad Sets: AROM;Seated;Left;10 reps Hip ABduction/ADduction: AROM;Seated;Left;10 reps Straight Leg Raises: AROM;Seated;Left;10 reps    General Comments General comments (skin integrity, edema, etc.): Pt/wife had questions regarding CPM machine and its benefits. Discussed purpose of PROM vs. AROM and importance of following through with PT's ther exercises and participation in functional activities throughout the day.       Pertinent Vitals/Pain Pain Assessment: 0-10 Pain Score: 6  Pain Location: Left knee Pain Descriptors / Indicators: Aching Pain Intervention(s): Monitored during session;Limited activity within patient's tolerance    Home Living Family/patient expects to be discharged to:: Private residence Living Arrangements: Spouse/significant other Available Help at Discharge: Family Type of Home: House Home Access: Stairs to enter Entrance Stairs-Rails: Right Home Layout: One level Home Equipment: Cane - single point;Crutches;Shower seat - built in      Prior Function Level of Independence: Independent      Comments: Pt wore knee brace  prior to surgery. Very active. Goes to gym daily.   PT Goals (current goals can now be found in the care plan section) Acute Rehab PT Goals Patient Stated Goal: to  know how to manage at home Progress towards PT goals: Progressing toward goals    Frequency  7X/week    PT Plan Current plan remains appropriate    Co-evaluation             End of Session Equipment Utilized During Treatment: Gait belt;Left knee immobilizer Activity Tolerance: Patient limited by pain;Patient tolerated treatment well Patient left: in chair;with call bell/phone within reach;with family/visitor present     Time: 1400-1430 PT Time Calculation (min) (ACUTE ONLY): 30 min  Charges:                       G Codes:      Jodi Geralds, [place] 06/23/2014, 2:48 PM

## 2014-06-23 NOTE — Progress Notes (Signed)
06/23/14 Set up with [facility] for HHPT by MD office. Contacted by Stanton Kidney with [facility] and informed that [facility] does not work with [employer]. Spoke with patient about [facility], he selected [facility]. Contacted Miranda at [facility] and set up [place]. [facility] [employer] [place] is providing a rolling walker, patient refused 3N1. No other equipment needs identified.

## 2014-06-23 NOTE — Plan of Care (Signed)
Problem: Consults Goal: Diagnosis- Total Joint Replacement Outcome: Completed/Met Date Met:  06/23/14 Primary Total Knee Left

## 2014-06-23 NOTE — Progress Notes (Signed)
1 Day Post-Op  Subjective:  1 - Difficult Catheter Placement / Urethral False Pass - s/p bedside cysto / catheter placement over wire 06/22/14 for difficult foley with urethral false pass.   2 - Enlarged Prostate - pt wih minimal baseline voiding symptoms. 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 Started on tamsulosin in house.  Today " Tony Luna " is w/o complaints. Tollerating catheter well. Some bleeding around catheter at meatus as expected. Making progress with PT.   Objective: Vital signs in last 24 hours: Temp:  [97.5 F (36.4 C)-98.7 F (37.1 C)] 97.5 F (36.4 C) (01/27 1400) Pulse Rate:  [84-88] 84 (01/27 1400) Resp:  [17-18] 18 (01/27 1400) BP: (143-165)/(52-73) 165/52 mmHg (01/27 1400) SpO2:  [94 %-100 %] 97 % (01/27 1400) Last BM Date: 06/22/14  Intake/Output from previous day: 01/26 0701 - 01/27 0700 In: 1640 [P.O.:440; I.V.:1200] Out: 1850 [Urine:1350; Blood:500] Intake/Output this shift: Total I/O In: -  Out: 2000 [Urine:2000]  General appearance: alert, cooperative and appears stated age Head: Normocephalic, without obvious abnormality, atraumatic Eyes: negative Nose: Nares normal. Septum midline. Mucosa normal. No drainage or sinus tenderness. Throat: lips, mucosa, and tongue normal; teeth and gums normal Neck: supple, symmetrical, trachea midline Back: symmetric, no curvature. ROM normal. No CVA tenderness. Resp: non-labored on room air Cardio: Nl rate GI: soft, non-tender; bowel sounds normal; no masses,  no organomegaly Male genitalia: normal, foley c/d/i with copious clear yellow urine. some dried bloot at meatus w/oa ctive bleeding.  Extremities: LE splint in place bilat feet warm w/o edema. Pulses: 2+ and symmetric Skin: Skin color, texture, turgor normal. No rashes or lesions Lymph nodes: Cervical, supraclavicular, and axillary nodes normal. Neurologic: Grossly normal  Lab Results:   Recent Labs  06/23/14 0545   WBC 10.0  HGB 11.9*  HCT 33.8*  PLT 121*   BMET  Recent Labs  06/23/14 0545  NA 136  K 4.0  CL 101  CO2 25  GLUCOSE 123*  BUN 17  CREATININE 1.04  CALCIUM 8.1*   PT/INR No results for input(s): LABPROT, INR in the last 72 hours. ABG No results for input(s): PHART, HCO3 in the last 72 hours.  Invalid input(s): PCO2, PO2  Studies/Results: Dg Knee Left Port  06/22/2014   CLINICAL DATA:  Status post left total knee joint replacement.  EXAM: PORTABLE LEFT KNEE - 1-2 VIEW  COMPARISON:  AP and lateral views of the left knee of August 14, 2010  FINDINGS: The patient has undergone left total knee joint prosthesis placement. Radiographic positioning of the prosthetic components is good. There is a small suprapatellar effusion. No skin staples or drain lines are visible.  IMPRESSION: The patient has undergone left total knee joint replacement without evidence of immediate postprocedure complication.   Electronically Signed   By: David  Martinique   On: 06/22/2014 13:34    Anti-infectives: Anti-infectives    Start     Dose/Rate Route Frequency Ordered Stop   06/22/14 1945  gentamicin (GARAMYCIN) IVPB 80 mg    Comments:  Around time of bedside cystoscopy after joint replacement earlier today.   80 mg100 mL/hr over 30 Minutes Intravenous  Once 06/22/14 1942 06/22/14 2228   06/22/14 1400  ceFAZolin (ANCEF) IVPB 2 g/50 mL premix     2 g100 mL/hr over 30 Minutes Intravenous Every 6 hours 06/22/14 1240 06/22/14 2117   06/22/14 0600  ceFAZolin (ANCEF) IVPB 2 g/50 mL premix  Status:  Discontinued     2  g100 mL/hr over 30 Minutes Intravenous On call to O.R. 06/21/14 1244 06/22/14 1219   06/22/14 0545  ceFAZolin (ANCEF) 2-3 GM-% IVPB SOLR    Comments:  Scronce, Trina   : cabinet override      06/22/14 0545 06/22/14 0811   06/22/14 0000  sulfamethoxazole-trimethoprim (BACTRIM DS,SEPTRA DS) 800-160 MG per tablet     1 tablet Oral 2 times daily 06/22/14 1929        Assessment/Plan:  1 -  Difficult Catheter Placement / Urethral False Pass - Keep current catheter x 3-7 days then repeat trial of void in Urology office. We will arrange. RX'd proph bactrim while catheter in. Reinforced some blood at meatus expected from urethral bleeding around catheter.  2 - Enlarged Prostate - initiated on tamsulosin and has RX to continue as outpatient for now.  3 - Pt OK for DC and at anypoint with foley in place form GU perspective.   [facility], Aylah Yeary 06/23/2014

## 2014-06-24 ENCOUNTER — Encounter (HOSPITAL_COMMUNITY): Payer: Self-pay | Admitting: Orthopedic Surgery

## 2014-06-24 LAB — CBC
HCT: 31 % — ABNORMAL LOW (ref 39.0–52.0)
Hemoglobin: 11 g/dL — ABNORMAL LOW (ref 13.0–17.0)
MCH: 30.5 pg (ref 26.0–34.0)
MCHC: 35.5 g/dL (ref 30.0–36.0)
MCV: 85.9 fL (ref 78.0–100.0)
Platelets: 111 10*3/uL — ABNORMAL LOW (ref 150–400)
RBC: 3.61 MIL/uL — ABNORMAL LOW (ref 4.22–5.81)
RDW: 12.4 % (ref 11.5–15.5)
WBC: 9.9 10*3/uL (ref 4.0–10.5)

## 2014-06-24 NOTE — Progress Notes (Signed)
Patient ID: Tony Luna, male   DOB: 09-19-1940, 74 y.o.   MRN: [id number]     Subjective:  Patient reports pain as mild.  Patient sitting up in the chair and in no acute distress.  Objective:   VITALS:   Filed Vitals:   06/23/14 2027 06/24/14 0000 06/24/14 0400 06/24/14 0500  BP: 142/61   144/60  Pulse: 82   84  Temp: 98.4 F (36.9 C)   98.7 F (37.1 C)  TempSrc: Oral   Oral  Resp: 16 18 18 16   Height:      Weight:      SpO2: 99% 98% 98% 97%    ABD soft Sensation intact distally Dorsiflexion/Plantar flexion intact Incision: dressing C/D/I and no drainage Dressing removed and wound has no sign of infection Dry dressing applied   Lab Results  Component Value Date   WBC 10.0 06/23/2014   HGB 11.9* 06/23/2014   HCT 33.8* 06/23/2014   MCV 86.7 06/23/2014   PLT 121* 06/23/2014   BMET    Component Value Date/Time   NA 136 06/23/2014 0545   K 4.0 06/23/2014 0545   CL 101 06/23/2014 0545   CO2 25 06/23/2014 0545   GLUCOSE 123* 06/23/2014 0545   BUN 17 06/23/2014 0545   CREATININE 1.04 06/23/2014 0545   CALCIUM 8.1* 06/23/2014 0545   GFRNONAA 70* 06/23/2014 0545   GFRAA 81* 06/23/2014 0545     Assessment/Plan: 2 Days Post-Op   Principal Problem:   Primary osteoarthritis of left knee Active Problems:   Knee osteoarthritis   Acute urinary retention   Advance diet Up with therapy Discharge home with home health WBAT Dry dressing PRN Follow up in two weeks with Dr Dennard Schaumann, Erlene Quan 06/24/2014, 7:22 AM  Discussed and agree with above.  Marchia Bond, MD Cell [phone]

## 2014-06-24 NOTE — Discharge Summary (Signed)
Physician Discharge Summary  Patient ID: Tony Luna MRN: [id number] DOB/AGE: 01-Jun-1940 74 y.o.  Admit date: 06/22/2014 Discharge date: 06/24/2014  Admission Diagnoses:  Primary osteoarthritis of left knee  Discharge Diagnoses:  Principal Problem:   Primary osteoarthritis of left knee Active Problems:   Knee osteoarthritis   Acute urinary retention   Past Medical History  Diagnosis Date  . Arthritis   . Allergy   . Esophageal tear   . Rotator cuff tear   . Complication of anesthesia     :"need catheter"  . Dysrhythmia     ? something told by reg dr  unable to say what it was  . Hypothyroidism   . Pneumonia     hx  . Primary osteoarthritis of left knee 06/22/2014    Surgeries: Procedure(s): TOTAL KNEE ARTHROPLASTY on 06/22/2014   Consultants (if any): Treatment Team:  Alexis Frock, MD  Discharged Condition: Improved  Hospital Course: Tony Luna is an 74 y.o. male who was admitted 06/22/2014 with a diagnosis of Primary osteoarthritis of left knee and went to the operating room on 06/22/2014 and underwent the above named procedures.    He was given perioperative antibiotics:  Anti-infectives    Start     Dose/Rate Route Frequency Ordered Stop   06/22/14 1945  gentamicin (GARAMYCIN) IVPB 80 mg    Comments:  Around time of bedside cystoscopy after joint replacement earlier today.   80 mg100 mL/hr over 30 Minutes Intravenous  Once 06/22/14 1942 06/22/14 2228   06/22/14 1400  ceFAZolin (ANCEF) IVPB 2 g/50 mL premix     2 g100 mL/hr over 30 Minutes Intravenous Every 6 hours 06/22/14 1240 06/22/14 2117   06/22/14 0600  ceFAZolin (ANCEF) IVPB 2 g/50 mL premix  Status:  Discontinued     2 g100 mL/hr over 30 Minutes Intravenous On call to O.R. 06/21/14 1244 06/22/14 1219   06/22/14 0545  ceFAZolin (ANCEF) 2-3 GM-% IVPB SOLR    Comments:  Scronce, Trina   : cabinet override      06/22/14 0545 06/22/14 0811   06/22/14 0000  sulfamethoxazole-trimethoprim (BACTRIM  DS,SEPTRA DS) 800-160 MG per tablet     1 tablet Oral 2 times daily 06/22/14 1929      .  He was given sequential compression devices, early ambulation, and xarelto for DVT prophylaxis.  He had bright red blood on i/o cath after spinal in OR, urology consulted, foley placed under cysto, and patient being dc on septra with foley and f/u with Dr. Tammi Klippel in 1 week.  He benefited maximally from the hospital stay and there were no complications.    Recent vital signs:  Filed Vitals:   06/24/14 0500  BP: 144/60  Pulse: 84  Temp: 98.7 F (37.1 C)  Resp: 16    Recent laboratory studies:  Lab Results  Component Value Date   HGB 11.9* 06/23/2014   HGB 15.2 06/14/2014   Lab Results  Component Value Date   WBC 10.0 06/23/2014   PLT 121* 06/23/2014   Lab Results  Component Value Date   INR 1.08 06/14/2014   Lab Results  Component Value Date   NA 136 06/23/2014   K 4.0 06/23/2014   CL 101 06/23/2014   CO2 25 06/23/2014   BUN 17 06/23/2014   CREATININE 1.04 06/23/2014   GLUCOSE 123* 06/23/2014    Discharge Medications:     Medication List    TAKE these medications        ARGININE  PO  Take 500 mg by mouth 2 (two) times daily.     baclofen 10 MG tablet  Commonly known as:  LIORESAL  Take 1 tablet (10 mg total) by mouth 3 (three) times daily. As needed for muscle spasm     Bitter Melon Powd  Take 1 tablet by mouth daily.     MILK THISTLE PO  Take 1 tablet by mouth 2 (two) times daily.     multivitamin with minerals Tabs tablet  Take 1 tablet by mouth 2 (two) times daily.     ondansetron 4 MG tablet  Commonly known as:  ZOFRAN  Take 1 tablet (4 mg total) by mouth every 8 (eight) hours as needed for nausea or vomiting.     OVER THE COUNTER MEDICATION  Take 1 tablet by mouth 2 (two) times daily. Omega Genic     OVER THE COUNTER MEDICATION  Take 2 tablets by mouth daily. Bio Magnesium Citrate     OVER THE COUNTER MEDICATION  Take 1 tablet by mouth daily.  Vitamin B12 - folate     OVER THE COUNTER MEDICATION  Take 10,000 mcg by mouth 2 (two) times daily. Vitamin B12-methylfolate     OVER THE COUNTER MEDICATION  Take 1 tablet by mouth daily. Pure adrenal 400     OVER THE COUNTER MEDICATION  Take 4 sprays by mouth at bedtime. Secretropin oral spray - growth hormone     OVER THE COUNTER MEDICATION  Take 1 capsule by mouth 3 (three) times daily. Instaflex     OVER THE COUNTER MEDICATION  Take 1 tablet by mouth 2 (two) times daily. Healthy Prostrate     OVER THE COUNTER MEDICATION  Take 500 mg by mouth 2 (two) times daily. Turmeric with Meriva     oxyCODONE-acetaminophen 10-325 MG per tablet  Commonly known as:  PERCOCET  Take 1-2 tablets by mouth every 6 (six) hours as needed for pain. MAXIMUM TOTAL ACETAMINOPHEN DOSE IS 4000 MG PER DAY     Pregnenolone Powd  Take 30 mg by mouth daily.     rivaroxaban 10 MG Tabs tablet  Commonly known as:  XARELTO  Take 1 tablet (10 mg total) by mouth daily.     sennosides-docusate sodium 8.6-50 MG tablet  Commonly known as:  SENOKOT-S  Take 2 tablets by mouth daily.     sulfamethoxazole-trimethoprim 800-160 MG per tablet  Commonly known as:  BACTRIM DS,SEPTRA DS  Take 1 tablet by mouth 2 (two) times daily. While catheter in place to prevent infection     tamsulosin 0.4 MG Caps capsule  Commonly known as:  FLOMAX  Take 1 capsule (0.4 mg total) by mouth daily. For Urination.     TAURINE PO  Take 1 tablet by mouth 2 (two) times daily.     testosterone 50 MG/5GM (1%) Gel  Commonly known as:  ANDROGEL  Place 5 g onto the skin daily.     thyroid 90 MG tablet  Commonly known as:  ARMOUR  Take 90 mg by mouth daily.     vitamin C 500 MG tablet  Commonly known as:  ASCORBIC ACID  Take 500 mg by mouth 2 (two) times daily.     VITAMIN D-3 PO  Take 7,000 Units by mouth daily. 5,00000 u in the morning, 2,000 u in the evening        Diagnostic Studies: Dg Knee Left Port  06/22/2014    CLINICAL DATA:  Status post left total knee joint  replacement.  EXAM: PORTABLE LEFT KNEE - 1-2 VIEW  COMPARISON:  AP and lateral views of the left knee of August 14, 2010  FINDINGS: The patient has undergone left total knee joint prosthesis placement. Radiographic positioning of the prosthetic components is good. There is a small suprapatellar effusion. No skin staples or drain lines are visible.  IMPRESSION: The patient has undergone left total knee joint replacement without evidence of immediate postprocedure complication.   Electronically Signed   By: David  Martinique   On: 06/22/2014 13:34    Disposition:         Follow-up Information    Follow up with [name], MD. Schedule an appointment as soon as possible for a visit in 2 weeks.   Specialty:  Orthopedic Surgery   Contact information:   [place] [address] [phone]       Follow up with [facility].   Why:  They will contact you to schedule home therapy visits.   Contact information:   [place] [address] [phone]        Signed: [name] 06/24/2014, 7:52 AM

## 2014-06-24 NOTE — Progress Notes (Signed)
Tony Luna discharged home per MD order. Discharge instructions reviewed and discussed with patient. All questions and concerns answered. Copy of instructions and scripts given to patient. IV removed.  Patient escorted to car by staff in a wheelchair. No distress noted upon discharge.   Esaw Dace 06/24/2014 7:34 AM

## 2014-06-24 NOTE — Progress Notes (Signed)
Occupational Therapy Treatment Patient Details Name: Tony Luna MRN: [id number] DOB: 03/05/1941 Today's Date: 06/24/2014    History of present illness Patient is a 74 y/o male s/p L TKA.    OT comments  Pt improving with all adls. Pt continues to be mildly impulsive when on his feet and requires cues for safety when coming sit to stand and when walking with the walker.  Feel pt would benefit from leg catheter bag at d/c.  Follow Up Recommendations  No OT follow up;Supervision/Assistance - 24 hour    Equipment Recommendations  3 in 1 bedside comode    Recommendations for Other Services      Precautions / Restrictions Precautions Precautions: Knee;Fall Precaution Booklet Issued: Yes (comment) Precaution Comments: Reviewed precautions Required Braces or Orthoses: Knee Immobilizer - Left Knee Immobilizer - Left: On when out of bed or walking Restrictions Weight Bearing Restrictions: Yes LLE Weight Bearing: Weight bearing as tolerated       Mobility Bed Mobility Overal bed mobility: Modified Independent Bed Mobility: Supine to Sit     Supine to sit: Supervision;HOB elevated Sit to supine: Supervision      Transfers Overall transfer level: Needs assistance Equipment used: Rolling walker (2 wheeled) Transfers: Stand Pivot Transfers Sit to Stand: Supervision         General transfer comment: S to manage catheter.  Spoke to nursing about a "leg bag" for this pt since he is going home with cather for a week.  This will decrease his fall risk and is a bit more dignified.    Balance Overall balance assessment: Needs assistance Sitting-balance support: No upper extremity supported;Feet supported Sitting balance-Leahy Scale: Normal     Standing balance support: Bilateral upper extremity supported;During functional activity Standing balance-Leahy Scale: Fair Standing balance comment: pt with LOB x2 in bathroom when attempting to walk without the walker.                    ADL Overall ADL's : Needs assistance/impaired Eating/Feeding: Independent;Sitting   Grooming: Supervision/safety;Standing   Upper Body Bathing: Set up;Sitting   Lower Body Bathing: Set up;Sit to/from stand   Upper Body Dressing : Set up;Sitting   Lower Body Dressing: Sit to/from stand;Minimal assistance Lower Body Dressing Details (indicate cue type and reason): assist for sock and shoe on LLE. Toilet Transfer: Supervision/safety;Ambulation;RW Toilet Transfer Details (indicate cue type and reason): pt walked to comfort commode in bathroom.  pt cont to deny needing a 3:1 although therapist feels it would be of benefit to pt. Toileting- Clothing Manipulation and Hygiene: Supervision/safety;Sit to/from Nurse, children's Details (indicate cue type and reason): Educated pt on safe shower transfer technique.  Functional mobility during ADLs: Supervision/safety;Rolling walker General ADL Comments: Pt overall does well with adls although does seem impulsive at times when using walker. Reviewed safe procedures when using walker and to always reach back for chair etc.  Pt used to moving quickly and needs cues to slow down and be safe.      Vision                     Perception     Praxis      Cognition   Behavior During Therapy: [facility] for tasks assessed/performed Overall Cognitive Status: Within Functional Limits for tasks assessed                       Extremity/Trunk Assessment  Exercises     Shoulder Instructions       General Comments      Pertinent Vitals/ Pain       Pain Assessment: 0-10 Pain Score: 4  Pain Location: L knee Pain Descriptors / Indicators: Aching;Sore Pain Intervention(s): Monitored during session;Repositioned  Home Living                                          Prior Functioning/Environment              Frequency Min 1X/week     Progress Toward Goals  OT  Goals(current goals can now be found in the care plan section)  Progress towards OT goals: Progressing toward goals  Acute Rehab OT Goals Patient Stated Goal: to know how to manage at home OT Goal Formulation: With patient/family Time For Goal Achievement: 07/07/14 Potential to Achieve Goals: Good ADL Goals Pt Will Perform Grooming: with modified independence;standing Pt Will Perform Lower Body Bathing: with set-up;with supervision;sit to/from stand Pt Will Perform Lower Body Dressing: with set-up;with supervision;sit to/from stand Pt Will Transfer to Toilet: with modified independence;ambulating;bedside commode  Plan Discharge plan remains appropriate    Co-evaluation                 End of Session Equipment Utilized During Treatment: Gait belt;Rolling walker;Left knee immobilizer   Activity Tolerance Patient tolerated treatment well   Patient Left in chair;with call bell/phone within reach;with family/visitor present   Nurse Communication Mobility status;Other (comment) (need for leg bag.)        Time: 4970-2637 OT Time Calculation (min): 39 min  Charges: OT General Charges $OT Visit: 1 Procedure OT Treatments $Self Care/Home Management : 38-52 mins  Glenford Peers 06/24/2014, 9:46 AM  [phone]

## 2014-06-24 NOTE — Progress Notes (Signed)
Physical Therapy Treatment Patient Details Name: Tony Luna MRN: [id number] DOB: 1941-02-28 Today's Date: 06/24/2014    History of Present Illness Patient is a 74 y/o male s/p L TKA.     PT Comments    Pt and wife educated on stair management technique, given handout to reinforce carryover home. Reviewed HEP and car transfer technique. Pt hopeful to D/C home this afternoon when equipment is delivered.   Follow Up Recommendations  Home health PT;Supervision/Assistance - 24 hour     Equipment Recommendations  Rolling walker with 5" wheels    Recommendations for Other Services       Precautions / Restrictions Precautions Precautions: Knee Precaution Booklet Issued: Yes (comment) Precaution Comments: Reviewed precautions Required Braces or Orthoses: Knee Immobilizer - Left Knee Immobilizer - Left: On when out of bed or walking Restrictions Weight Bearing Restrictions: Yes LLE Weight Bearing: Weight bearing as tolerated    Mobility  Bed Mobility Overal bed mobility: Modified Independent Bed Mobility: Supine to Sit     Supine to sit: Supervision;HOB elevated Sit to supine: Supervision   General bed mobility comments: up in chair   Transfers Overall transfer level: Needs assistance Equipment used: Rolling walker (2 wheeled) Transfers: Sit to/from Stand Sit to Stand: Supervision         General transfer comment: cues for RW management and catheter management   Ambulation/Gait Ambulation/Gait assistance: Supervision Ambulation Distance (Feet): 200 Feet Assistive device: Rolling walker (2 wheeled) Gait Pattern/deviations: Step-through pattern;Wide base of support Gait velocity: decreased Gait velocity interpretation: Below normal speed for age/gender General Gait Details: cues for RW safety and encouraged to not supinate Rt foot; discussed proper shoe wear with pt and wife regarding Rt foot supination    Stairs Stairs: Yes Stairs assistance: Min  guard Stair Management: No rails;Step to pattern;With walker;Backwards Number of Stairs: 2 General stair comments: educated pt and wife on technique; pt and wife performed teachback technique; given handout   Wheelchair Mobility    Modified Rankin (Stroke Patients Only)       Balance Overall balance assessment: Needs assistance Sitting-balance support: Feet supported;No upper extremity supported Sitting balance-Leahy Scale: Good     Standing balance support: During functional activity;Bilateral upper extremity supported Standing balance-Leahy Scale: Poor Standing balance comment: RW to balance                     Cognition Arousal/Alertness: Awake/alert Behavior During Therapy: Impulsive Overall Cognitive Status: Within Functional Limits for tasks assessed                      Exercises      General Comments General comments (skin integrity, edema, etc.): reviewed car transfer technique with pt and wife ; reviewed HEP handout and answered questions       Pertinent Vitals/Pain Pain Assessment: 0-10 Pain Score: 7  Pain Location: 7/10 with [place]; 3/10 at rest Pain Descriptors / Indicators: Aching Pain Intervention(s): Monitored during session;Premedicated before session;Repositioned    Home Living                      Prior Function            PT Goals (current goals can now be found in the care plan section) Acute Rehab PT Goals Patient Stated Goal: to go home today PT Goal Formulation: With patient Time For Goal Achievement: 07/06/14 Potential to Achieve Goals: Fair Progress towards PT goals: Progressing toward goals  Frequency  7X/week    PT Plan Current plan remains appropriate    Co-evaluation             End of Session Equipment Utilized During Treatment: Gait belt;Left knee immobilizer Activity Tolerance: Patient limited by pain;Patient tolerated treatment well Patient left: in chair;with call bell/phone within  reach;with family/visitor present     Time: 7124-5809 PT Time Calculation (min) (ACUTE ONLY): 20 min  Charges:  $Gait Training: 8-22 mins                    G Codes:      Elie Confer [place], [place]  [phone] 06/24/2014, 11:09 AM

## 2015-01-20 NOTE — Patient Outreach (Signed)
[facility]) Care Management  01/20/2015  TRAEVON MEIRING 04/01/1941 [id number]   Referral from HTA tier 4 list, assigned to Sherrin Daisy, [facility] for patient outreach.  Raymond Azure L. Dael Howland, [place] Care Management Assistant

## 2015-01-24 NOTE — Patient Outreach (Signed)
[facility]) Care Management  01/24/2015  ALDAN CAMEY 12-29-1940 [id number]   Reassigned per Leadership, assigned Erenest Rasher, RN.  Thanks, Ronnell Freshwater. [place], [place] Assistant Phone: [phone] Fax: [phone]

## 2015-01-25 ENCOUNTER — Ambulatory Visit: Payer: Self-pay

## 2015-01-26 ENCOUNTER — Telehealth: Payer: Self-pay | Admitting: Family Medicine

## 2015-01-26 NOTE — Telephone Encounter (Signed)
Please triage thanks

## 2015-01-26 NOTE — Telephone Encounter (Signed)
Can not do anything about the 2 rxs he already picked up, but can send in order for 75 for next time if patient would like. Thanks.

## 2015-01-26 NOTE — Telephone Encounter (Signed)
Please note below, and advise.  Thanks,

## 2015-01-26 NOTE — Telephone Encounter (Signed)
Spoke with [place] from the pharmacy regarding the note below. She stated that the pt is getting two prescriptions from two different doctors for testosterone medication for a total of 75 mg daily. He gets a prescription for 50 mg from Dr. Venia Minks and a prescription for 25 mg from Dr. Johnsie Cancel from [facility]. She stated that the pt had some lab work with the Dr Lucita Lora from [facility] who had added a new prescription to bring up the dose to 75mg . So he is having to pay 2 co-pays.  I also spoke with Mr Enfield regarding this message and he stated that he wants to know if this refill can be resubmitted so he would not have to pay 2 co-pays and he can get some of his money back. He refilled both prescriptions today.   Please advise.

## 2015-01-26 NOTE — Telephone Encounter (Signed)
Pharmacy called saying the patient is getting testosterone medication from you and another provider.  The other provider looked at his lab levels and increased it with another RX.  She said with both Rx' s it is costing him a lot of money.  The pharmacy's call back is   [phone].  Thanks Con Memos

## 2015-01-27 NOTE — Telephone Encounter (Signed)
I called [facility] and spoke to [place], she reports that they will refund Tony Luna his co-pay. I went ahead and authorized Testosterone 75 mg. [place] reports that we will receive a PA for this. sd

## 2015-03-25 DIAGNOSIS — N401 Enlarged prostate with lower urinary tract symptoms: Secondary | ICD-10-CM

## 2015-03-25 DIAGNOSIS — E291 Testicular hypofunction: Secondary | ICD-10-CM | POA: Insufficient documentation

## 2015-03-25 DIAGNOSIS — N529 Male erectile dysfunction, unspecified: Secondary | ICD-10-CM | POA: Insufficient documentation

## 2015-03-25 DIAGNOSIS — N138 Other obstructive and reflux uropathy: Secondary | ICD-10-CM | POA: Insufficient documentation

## 2015-04-13 ENCOUNTER — Encounter: Payer: Self-pay | Admitting: Family Medicine

## 2015-04-13 ENCOUNTER — Ambulatory Visit (INDEPENDENT_AMBULATORY_CARE_PROVIDER_SITE_OTHER): Payer: PPO | Admitting: Family Medicine

## 2015-04-13 VITALS — BP 146/76 | HR 96 | Temp 97.8°F | Resp 16 | Ht 72.0 in | Wt 180.0 lb

## 2015-04-13 DIAGNOSIS — E291 Testicular hypofunction: Secondary | ICD-10-CM | POA: Diagnosis not present

## 2015-04-13 DIAGNOSIS — K7030 Alcoholic cirrhosis of liver without ascites: Secondary | ICD-10-CM | POA: Insufficient documentation

## 2015-04-13 DIAGNOSIS — Z1211 Encounter for screening for malignant neoplasm of colon: Secondary | ICD-10-CM

## 2015-04-13 DIAGNOSIS — R161 Splenomegaly, not elsewhere classified: Secondary | ICD-10-CM | POA: Insufficient documentation

## 2015-04-13 DIAGNOSIS — M1990 Unspecified osteoarthritis, unspecified site: Secondary | ICD-10-CM | POA: Insufficient documentation

## 2015-04-13 DIAGNOSIS — Z0000 Encounter for general adult medical examination without abnormal findings: Secondary | ICD-10-CM

## 2015-04-13 DIAGNOSIS — D696 Thrombocytopenia, unspecified: Secondary | ICD-10-CM | POA: Insufficient documentation

## 2015-04-13 DIAGNOSIS — N529 Male erectile dysfunction, unspecified: Secondary | ICD-10-CM | POA: Insufficient documentation

## 2015-04-13 DIAGNOSIS — K7460 Unspecified cirrhosis of liver: Secondary | ICD-10-CM | POA: Insufficient documentation

## 2015-04-13 DIAGNOSIS — Z6824 Body mass index (BMI) 24.0-24.9, adult: Secondary | ICD-10-CM | POA: Insufficient documentation

## 2015-04-13 DIAGNOSIS — Z6821 Body mass index (BMI) 21.0-21.9, adult: Secondary | ICD-10-CM | POA: Insufficient documentation

## 2015-04-13 DIAGNOSIS — K7031 Alcoholic cirrhosis of liver with ascites: Secondary | ICD-10-CM | POA: Insufficient documentation

## 2015-04-13 DIAGNOSIS — R011 Cardiac murmur, unspecified: Secondary | ICD-10-CM | POA: Insufficient documentation

## 2015-04-13 DIAGNOSIS — R7989 Other specified abnormal findings of blood chemistry: Secondary | ICD-10-CM

## 2015-04-13 MED ORDER — TESTOSTERONE 50 MG/5GM (1%) TD GEL: 10.0000 g | Freq: Every day | TRANSDERMAL | Status: DC

## 2015-04-13 NOTE — Progress Notes (Signed)
Patient ID: Tony Luna, male   DOB: [birth year], 74 y.o.   MRN: [id number]       Patient: Tony Luna, Male    DOB: 1940/11/19, 74 y.o.   MRN: [id number] Visit Date: 04/13/2015  Today's Provider: Margarita Rana, MD   Chief Complaint  Patient presents with  . Medicare Wellness   Subjective:    Annual wellness visit Tony Luna is a 74 y.o. male. He feels well. He reports exercising 3 days a week. He reports he is sleeping well. 08/06/13 CPE  -----------------------------------------------------------   Review of Systems  Constitutional: Negative.   HENT: Positive for hearing loss.   Eyes: Negative.   Respiratory: Negative.   Cardiovascular: Negative.   Gastrointestinal: Negative.   Endocrine: Negative.   Genitourinary: Positive for difficulty urinating.  Musculoskeletal: Positive for arthralgias.  Skin: Negative.   Allergic/Immunologic: Negative.   Neurological: Negative.   Hematological: Negative.   Psychiatric/Behavioral: Negative.     Social History   Social History  . Marital Status: Married    Spouse Name: N/A  . Number of Children: 2  . Years of Education: N/A   Occupational History  . textiles    Social History Main Topics  . Smoking status: Former Smoker -- 1.00 packs/day for 15 years    Types: Cigarettes    Quit date: 06/15/1983  . Smokeless tobacco: Current User    Types: Chew  . Alcohol Use: No     Comment: quit 10-2007  . Drug Use: No  . Sexual Activity: Not on file   Other Topics Concern  . Not on file   Social History Narrative   [employer] graduate   [employer] hunter   Very active   Walks 1 hour daily    Patient Active Problem List   Diagnosis Date Noted  . Arthritis 04/13/2015  . Body mass index (BMI) of 24.0-24.9 in adult 04/13/2015  . 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 Cardiac murmur 04/13/2015  . Hypotestosteronism 04/13/2015  . Enlargement of spleen 04/13/2015  . Change in blood platelet count  04/13/2015  . 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 Knee osteoarthritis 06/22/2014  . Acute urinary retention 06/22/2014  . 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 BICEPS TENDON RUPTURE, RIGHT 08/14/2010  . ROTATOR CUFF SYNDROME, RIGHT 08/31/2008  . DEGENERATIVE JOINT DISEASE 03/03/2008  . DEGENERATIVE JOINT DISEASE, LEFT KNEE 03/03/2008  . 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 Knee surgery      ? loose body/ chondral defect 1yrs  . Rotator cuff repair      left  . Tonsillectomy    . Spine surgery    . Hernia repair Left     inguinal herniorrhapy  . Cautery of turbinates    . Blepharaplasty    . Total knee arthroplasty Left 06/22/2014    Procedure: TOTAL KNEE ARTHROPLASTY;  Surgeon: [name], MD;  Location: [facility];  Service: Orthopedics;  Laterality: Left;    His family history includes Alcohol abuse in his mother; Diabetes in his mother; Mental retardation in his mother.    Previous Medications   BITTER MELON POWD    Take 1 tablet by mouth daily.   CHOLECALCIFEROL (VITAMIN D-3 PO)    Take  7,000 Units by mouth daily. 5,00000 u in the morning, 2,000 u in the evening   MELATONIN 3 MG TABS    Take 3 mg by mouth.   MILK THISTLE PO    Take 1 tablet by mouth 2 (two) times daily.   MULTIPLE VITAMIN (MULTIVITAMIN WITH MINERALS) TABS TABLET    Take 1 tablet by mouth 2 (two) times daily.   OVER THE COUNTER MEDICATION    Take 4 sprays by mouth at bedtime. Secretropin oral spray - growth hormone   OVER THE COUNTER MEDICATION    Take 1 capsule by mouth 3 (three) times daily. Instaflex   OVER THE COUNTER MEDICATION    Take 1 tablet by mouth 2 (two) times daily. Healthy Prostrate   OVER THE COUNTER MEDICATION    Take 500 mg by mouth 2  (two) times daily. Turmeric with Meriva   OVER THE COUNTER MEDICATION    Take 1 tablet by mouth 2 (two) times daily. Omega Genic   OVER THE COUNTER MEDICATION    Take 2 tablets by mouth daily. Bio Magnesium Citrate   OVER THE COUNTER MEDICATION    Take 1 tablet by mouth daily. Vitamin B12 - folate   OVER THE COUNTER MEDICATION    Take 10,000 mcg by mouth 2 (two) times daily. Vitamin B12-methylfolate   OVER THE COUNTER MEDICATION    Take 1 tablet by mouth daily. Pure adrenal 400   RIVAROXABAN (XARELTO) 10 MG TABS TABLET    Take 1 tablet (10 mg total) by mouth daily.   SENNOSIDES-DOCUSATE SODIUM (SENOKOT-S) 8.6-50 MG TABLET    Take 2 tablets by mouth daily.   SILDENAFIL (VIAGRA) 50 MG TABLET    Take by mouth.   TADALAFIL (CIALIS) 20 MG TABLET    1 tab 1 hour prior to intercourse   TAMSULOSIN (FLOMAX) 0.4 MG CAPS CAPSULE    Take 1 capsule (0.4 mg total) by mouth daily. For Urination.   TAURINE PO    Take 1 tablet by mouth 2 (two) times daily.   TESTOSTERONE (ANDROGEL) 50 MG/5GM (1%) GEL    Place 5 g onto the skin daily.   THYROID (ARMOUR) 90 MG TABLET    Take 90 mg by mouth daily.   VITAMIN C (ASCORBIC ACID) 500 MG TABLET    Take 500 mg by mouth 2 (two) times daily.    Patient Care Team: Margarita Rana, MD as PCP - General (Family Medicine)     Objective:   Vitals: BP 146/76 mmHg  Pulse 96  Temp(Src) 97.8 F (36.6 C) (Oral)  Resp 16  Ht 6' (1.829 m)  Wt 180 lb (81.647 kg)  BMI 24.41 kg/m2  SpO2 97%  Physical Exam  Constitutional: He is oriented to person, place, and time. He appears well-developed and well-nourished.  HENT:  Head: Normocephalic and atraumatic.  Right Ear: External ear normal.  Left Ear: External ear normal.  Nose: Nose normal.  Mouth/Throat: Oropharynx is clear and moist.  Eyes: Conjunctivae and EOM are normal. Pupils are equal, round, and reactive to light.  Neck: Normal range of motion. Neck supple.  Cardiovascular: Normal rate, regular rhythm, normal heart  sounds and intact distal pulses.   Pulmonary/Chest: Effort normal and breath sounds normal.  Abdominal: Soft. Bowel sounds are normal.  Musculoskeletal: Normal range of motion.  Neurological: He is alert and oriented to person, place, and time. He has normal reflexes.  Skin: Skin is warm and dry.  Psychiatric: He has a normal mood and  affect. His behavior is normal. Judgment and thought content normal.    Activities of Daily Living In your present state of health, do you have any difficulty performing the following activities: 04/13/2015 06/14/2014  Hearing? Tempie Donning  Vision? N N  Difficulty concentrating or making decisions? N N  Walking or climbing stairs? N Y  Dressing or bathing? N N  Doing errands, shopping? N N    Fall Risk Assessment Fall Risk  04/13/2015  Falls in the past year? No     Depression Screen PHQ 2/9 Scores 04/13/2015  PHQ - 2 Score 0    Cognitive Testing - 6-CIT  Correct? Score   What year is it? yes 0 0 or 4  What month is it? yes 0 0 or 3  Memorize:    Pia Mau,  44,  [address]  [place],      What time is it? (within 1 hour) yes 0 0 or 3  Count backwards from 20 yes 0 0, 2, or 4  Name the months of the year yes 0 0, 2, or 4  Repeat name & address above yes 0 0, 2, 4, 6, 8, or 10       TOTAL SCORE  0/28   Interpretation:  Normal  Normal (0-7) Abnormal (8-28)     Assessment & Plan:     Annual Wellness Visit  Reviewed patient's Family Medical History Reviewed and updated list of patient's medical providers Assessment of cognitive impairment was done Assessed patient's functional ability Established a written schedule for health screening [place] Completed and Reviewed  Exercise Activities and Dietary recommendations Goals    . Exercise 150 minutes per week (moderate activity)       Immunization History  Administered Date(s) Administered  . Influenza Whole 02/26/2008  . Pneumococcal Conjugate-13 08/06/2013  .  Pneumococcal Polysaccharide-23 05/24/2011  . Td 05/28/2004  . Tdap 02/13/2006  . Zoster 05/30/2007    1. Medicare annual wellness visit, subsequent As above.   2. Colon cancer screening Will schedule referral. Needs repeat colonoscopy next spring.   - Ambulatory referral to Gastroenterology  3. Low testosterone Still has low libido. Will increase dose. Recheck labs in 3 months.   - testosterone (ANDROGEL) 50 MG/5GM (1%) GEL; Place 10 g onto the skin daily.  Dispense: 300 g; Refill: 5  Patient was seen and examined by Jerrell Belfast, MD, and note scribed by Lynford Humphrey, [place].   I have reviewed the document for accuracy and completeness and I agree with above. Jerrell Belfast, MD   Margarita Rana, MD       ------------------------------------------------------------------------------------------------------------

## 2015-04-25 ENCOUNTER — Other Ambulatory Visit: Payer: Self-pay | Admitting: Family Medicine

## 2015-04-26 ENCOUNTER — Telehealth: Payer: Self-pay | Admitting: Gastroenterology

## 2015-04-26 ENCOUNTER — Other Ambulatory Visit: Payer: Self-pay

## 2015-04-26 NOTE — Telephone Encounter (Signed)
Gastroenterology Pre-Procedure Review  Request Date:07-19-2015 Requesting Physician: Dr.   PATIENT REVIEW QUESTIONS: The patient responded to the following health history questions as indicated:    1. Are you having any GI issues? no 2. Do you have a personal history of Polyps? no 3. Do you have a family history of Colon Cancer or Polyps? no 4. Diabetes Mellitus? no 5. Joint replacements in the past 12 months?yes (Knee 05/2014) 6. Major health problems in the past 3 months?no 7. Any artificial heart valves, MVP, or defibrillator?no    MEDICATIONS & ALLERGIES:    Patient reports the following regarding taking any anticoagulation/antiplatelet therapy:   Plavix, Coumadin, Eliquis, Xarelto, Lovenox, Pradaxa, Brilinta, or Effient? no Aspirin? no  Patient confirms/reports the following medications:  Current Outpatient Prescriptions  Medication Sig Dispense Refill   Bitter Melon POWD Take 1 tablet by mouth daily.     Cholecalciferol (VITAMIN D-3 PO) Take 5,000 Units by mouth daily. 5,00000 u in the morning, 2,000 u in the evening     Melatonin 3 MG TABS Take 10 mg by mouth at bedtime.      MILK THISTLE PO Take 1 tablet by mouth 2 (two) times daily.     Multiple Vitamin (MULTIVITAMIN WITH MINERALS) TABS tablet Take 1 tablet by mouth 2 (two) times daily.     OVER THE COUNTER MEDICATION Take 4 sprays by mouth at bedtime. Secretropin oral spray - growth hormone     OVER THE COUNTER MEDICATION Take 1 capsule by mouth 3 (three) times daily. Instaflex     OVER THE COUNTER MEDICATION Take 1,000 mg by mouth daily. Turmeric with Meriva     OVER THE COUNTER MEDICATION Take 3,000 mg by mouth daily. Omega Cure     OVER THE COUNTER MEDICATION Take 250 mg by mouth daily. Bio Magnesium Citrate     OVER THE COUNTER MEDICATION Take 20,000 mcg by mouth daily. Vitamin B12-methylfolate     OVER THE COUNTER MEDICATION Take 1 tablet by mouth daily. Pure adrenal 400     OVER THE COUNTER MEDICATION  Take 800 mcg by mouth daily. Active B Folate     OVER THE COUNTER MEDICATION Inject 50 Units as directed daily. Semorelin     OVER THE COUNTER MEDICATION 2 tablets daily. VitaPrime     OVER THE COUNTER MEDICATION 1,000 mg daily. Super Bio-C buffered     OVER THE COUNTER MEDICATION 1,000 mg daily. L'Arginine     OVER THE COUNTER MEDICATION 2 capsules 2 (two) times daily. SLF Forte     OVER THE COUNTER MEDICATION 1,000 mg. Myomin     OVER THE COUNTER MEDICATION 2 capsules daily. PEENUTS     sildenafil (VIAGRA) 50 MG tablet Take by mouth.     tadalafil (CIALIS) 20 MG tablet 1 tab 1 hour prior to intercourse     tamsulosin (FLOMAX) 0.4 MG CAPS capsule Take 1 capsule (0.4 mg total) by mouth daily. For Urination. 90 capsule 3   TAURINE PO Take 1,000 mg by mouth daily.      testosterone (ANDROGEL) 50 MG/5GM (1%) GEL Place 10 g onto the skin daily. 300 g 5   thyroid (ARMOUR) 90 MG tablet Take 90 mg by mouth daily.     No current facility-administered medications for this visit.    Patient confirms/reports the following allergies:  No Known Allergies  No orders of the defined types were placed in this encounter.    AUTHORIZATION INFORMATION Primary Insurance: 1D#: Group #:  Secondary Insurance:  1D#: Group #:  SCHEDULE INFORMATION: Date: 07-19-2015 Time: Location:[facility]

## 2015-05-02 ENCOUNTER — Other Ambulatory Visit: Payer: Self-pay

## 2015-05-02 ENCOUNTER — Ambulatory Visit: Payer: Self-pay

## 2015-05-02 NOTE — Patient Outreach (Signed)
Unsuccessful attempt made to contact patient via telephone at #[phone] to assess needs for community care coordination.  Will make another attempt on 12/8 if call  Is not returned

## 2015-05-05 ENCOUNTER — Other Ambulatory Visit: Payer: Self-pay

## 2015-05-06 ENCOUNTER — Encounter: Payer: Self-pay | Admitting: Family Medicine

## 2015-05-09 NOTE — Progress Notes (Signed)
Late entry: Unsuccessful attempt made to contact patient via telephone on 12/08 for assessment of need for community care coordination.  Patient later left message that he was going out of town for a week, would like information on [facility] and benefits offered.    Plan: Make telephone contact with patient on December 22 to assess needs for community care coordination

## 2015-05-19 ENCOUNTER — Other Ambulatory Visit: Payer: Self-pay

## 2015-05-19 NOTE — Patient Outreach (Signed)
Third unsuccessful attempt made to contact patient via telephone at # [phone],  HIPPA compliant message left with this RNCM contact information.   Plan; Discharge patient from caseload, Send patient and primary care physician discharge letter

## 2015-06-01 ENCOUNTER — Other Ambulatory Visit: Payer: Self-pay | Admitting: Family Medicine

## 2015-06-01 DIAGNOSIS — N529 Male erectile dysfunction, unspecified: Secondary | ICD-10-CM

## 2015-06-01 MED ORDER — SILDENAFIL CITRATE 50 MG PO TABS: 50.0000 mg | ORAL_TABLET | ORAL | Status: DC | PRN

## 2015-06-01 NOTE — Telephone Encounter (Signed)
Pt contacted office for refill request on the following medications: sildenafil (VIAGRA) 50 MG tablet  Pt is requesting to change the MG to 100 MG and would like 10 tablets sent to [facility]. Pt had CPE 04/13/15 and last RX was written 04/14/14 with 5 refills. Please advise. Thanks TNP

## 2015-06-02 DIAGNOSIS — M5137 Other intervertebral disc degeneration, lumbosacral region: Secondary | ICD-10-CM | POA: Diagnosis not present

## 2015-06-02 DIAGNOSIS — M25562 Pain in left knee: Secondary | ICD-10-CM | POA: Diagnosis not present

## 2015-06-07 DIAGNOSIS — M25562 Pain in left knee: Secondary | ICD-10-CM | POA: Diagnosis not present

## 2015-06-07 DIAGNOSIS — M5137 Other intervertebral disc degeneration, lumbosacral region: Secondary | ICD-10-CM | POA: Diagnosis not present

## 2015-06-09 DIAGNOSIS — M5137 Other intervertebral disc degeneration, lumbosacral region: Secondary | ICD-10-CM | POA: Diagnosis not present

## 2015-06-09 DIAGNOSIS — M25562 Pain in left knee: Secondary | ICD-10-CM | POA: Diagnosis not present

## 2015-06-14 ENCOUNTER — Telehealth: Payer: Self-pay | Admitting: Family Medicine

## 2015-06-14 DIAGNOSIS — N529 Male erectile dysfunction, unspecified: Secondary | ICD-10-CM

## 2015-06-14 MED ORDER — SILDENAFIL CITRATE 100 MG PO TABS: 100.0000 mg | ORAL_TABLET | ORAL | Status: DC | PRN

## 2015-06-14 NOTE — Telephone Encounter (Signed)
Pt need's a refill on sildenafil (VIAGRA) 50 MG tablet   Pt is out, pt wanted to know if the Dr could change the MG to 100 MG instead of 50 MG so he wouldn't have to keep calling in for a refill so soon.  Pt states he would like this called in @ [facility] in [place]. Thanks, [place]

## 2015-06-16 DIAGNOSIS — M5137 Other intervertebral disc degeneration, lumbosacral region: Secondary | ICD-10-CM | POA: Diagnosis not present

## 2015-06-16 DIAGNOSIS — M25562 Pain in left knee: Secondary | ICD-10-CM | POA: Diagnosis not present

## 2015-06-22 DIAGNOSIS — M5137 Other intervertebral disc degeneration, lumbosacral region: Secondary | ICD-10-CM | POA: Diagnosis not present

## 2015-06-22 DIAGNOSIS — M25562 Pain in left knee: Secondary | ICD-10-CM | POA: Diagnosis not present

## 2015-07-19 ENCOUNTER — Ambulatory Visit: Payer: PPO | Admitting: Anesthesiology

## 2015-07-19 ENCOUNTER — Encounter
Admission: RE | Disposition: A | Discharge status: Home / Self Care | Payer: Self-pay | Source: Ambulatory Visit | Attending: Gastroenterology

## 2015-07-19 ENCOUNTER — Ambulatory Visit
Admission: RE | Admit: 2015-07-19 | Discharge: 2015-07-19 | Disposition: A | Discharge status: Home / Self Care | Payer: PPO | Source: Ambulatory Visit | Attending: Gastroenterology | Admitting: Gastroenterology

## 2015-07-19 ENCOUNTER — Encounter: Payer: Self-pay | Admitting: Anesthesiology

## 2015-07-19 DIAGNOSIS — K633 Ulcer of intestine: Secondary | ICD-10-CM | POA: Insufficient documentation

## 2015-07-19 DIAGNOSIS — E039 Hypothyroidism, unspecified: Secondary | ICD-10-CM | POA: Diagnosis not present

## 2015-07-19 DIAGNOSIS — M1712 Unilateral primary osteoarthritis, left knee: Secondary | ICD-10-CM | POA: Diagnosis not present

## 2015-07-19 DIAGNOSIS — Z87891 Personal history of nicotine dependence: Secondary | ICD-10-CM | POA: Diagnosis not present

## 2015-07-19 DIAGNOSIS — K635 Polyp of colon: Secondary | ICD-10-CM | POA: Diagnosis not present

## 2015-07-19 DIAGNOSIS — Z79899 Other long term (current) drug therapy: Secondary | ICD-10-CM | POA: Diagnosis not present

## 2015-07-19 DIAGNOSIS — N529 Male erectile dysfunction, unspecified: Secondary | ICD-10-CM | POA: Insufficient documentation

## 2015-07-19 DIAGNOSIS — M1990 Unspecified osteoarthritis, unspecified site: Secondary | ICD-10-CM | POA: Diagnosis not present

## 2015-07-19 DIAGNOSIS — Z1211 Encounter for screening for malignant neoplasm of colon: Secondary | ICD-10-CM | POA: Insufficient documentation

## 2015-07-19 DIAGNOSIS — D123 Benign neoplasm of transverse colon: Secondary | ICD-10-CM | POA: Insufficient documentation

## 2015-07-19 DIAGNOSIS — K640 First degree hemorrhoids: Secondary | ICD-10-CM | POA: Diagnosis not present

## 2015-07-19 DIAGNOSIS — K5790 Diverticulosis of intestine, part unspecified, without perforation or abscess without bleeding: Secondary | ICD-10-CM | POA: Diagnosis not present

## 2015-07-19 DIAGNOSIS — D126 Benign neoplasm of colon, unspecified: Secondary | ICD-10-CM | POA: Insufficient documentation

## 2015-07-19 HISTORY — PX: COLONOSCOPY WITH PROPOFOL: SHX5780

## 2015-07-19 SURGERY — COLONOSCOPY WITH PROPOFOL
Anesthesia: General

## 2015-07-19 MED ORDER — SODIUM CHLORIDE 0.9 % IV SOLN
INTRAVENOUS | Status: DC
  Administered 2015-07-19: 1000 mL via INTRAVENOUS

## 2015-07-19 MED ORDER — PROPOFOL 500 MG/50ML IV EMUL
INTRAVENOUS | Status: DC | PRN
  Administered 2015-07-19: 100 ug/kg/min via INTRAVENOUS

## 2015-07-19 NOTE — H&P (Signed)
[facility]  [address] [place], [place] [address] Phone: [phone] Fax : [phone]  Primary Care Physician:  Margarita Rana, MD Primary Gastroenterologist:  Dr. Allen Norris  Pre-Procedure History & Physical: HPI:  Tony Luna is a 75 y.o. male is here for a screening colonoscopy.   Past Medical History  Diagnosis Date  . Arthritis   . Allergy   . Esophageal tear   . Rotator cuff tear   . Complication of anesthesia     :"need catheter"  . Dysrhythmia     ? something told by reg dr  unable to say what it was  . Hypothyroidism   . Pneumonia     hx  . Primary osteoarthritis of left knee 06/22/2014    Past Surgical History  Procedure Laterality Date  . Knee surgery      ? loose body/ chondral defect 48yrs  . Rotator cuff repair      left  . Tonsillectomy    . Spine surgery    . Hernia repair Left     inguinal herniorrhapy  . Cautery of turbinates    . Blepharaplasty    . Total knee arthroplasty Left 06/22/2014    Procedure: TOTAL KNEE ARTHROPLASTY;  Surgeon: [name], MD;  Location: [facility];  Service: Orthopedics;  Laterality: Left;    Prior to Admission medications   Medication Sig Start Date End Date Taking? Authorizing Provider  Bitter Melon POWD Take 1 tablet by mouth daily.   Yes Historical Provider, MD  Cholecalciferol (VITAMIN D-3 PO) Take 5,000 Units by mouth daily. 5,00000 u in the morning, 2,000 u in the evening   Yes Historical Provider, MD  Melatonin 3 MG TABS Take 10 mg by mouth at bedtime.  02/14/11  Yes Historical Provider, MD  MILK THISTLE PO Take 1 tablet by mouth 2 (two) times daily.   Yes Historical Provider, MD  Multiple Vitamin (MULTIVITAMIN WITH MINERALS) TABS tablet Take 1 tablet by mouth 2 (two) times daily.   Yes Historical Provider, MD  OVER THE COUNTER MEDICATION Take 4 sprays by mouth at bedtime. Secretropin oral spray - growth hormone   Yes Historical Provider, MD  OVER THE COUNTER MEDICATION Take 1 capsule by  mouth 3 (three) times daily. Instaflex   Yes Historical Provider, MD  OVER THE COUNTER MEDICATION Take 1,000 mg by mouth daily. Turmeric with Meriva   Yes Historical Provider, MD  OVER THE COUNTER MEDICATION Take 3,000 mg by mouth daily. Omega Cure   Yes Historical Provider, MD  OVER THE COUNTER MEDICATION Take 250 mg by mouth daily. Bio Magnesium Citrate   Yes Historical Provider, MD  OVER THE COUNTER MEDICATION Take 20,000 mcg by mouth daily. Vitamin B12-methylfolate   Yes Historical Provider, MD  OVER THE COUNTER MEDICATION Take 1 tablet by mouth daily. Pure adrenal 400   Yes Historical Provider, MD  OVER THE COUNTER MEDICATION Take 800 mcg by mouth daily. Active B Folate   Yes Historical Provider, MD  OVER THE COUNTER MEDICATION Inject 50 Units as directed daily. Semorelin   Yes Historical Provider, MD  OVER THE COUNTER MEDICATION 2 tablets daily. VitaPrime   Yes Historical Provider, MD  OVER THE COUNTER MEDICATION 1,000 mg daily. Super Bio-C buffered   Yes Historical Provider, MD  OVER THE COUNTER MEDICATION 1,000 mg daily. L'Arginine   Yes Historical Provider, MD  OVER THE COUNTER MEDICATION 2 capsules 2 (two) times daily. SLF Forte   Yes Historical Provider, MD  OVER THE COUNTER MEDICATION 1,000 mg. Myomin   Yes Historical Provider, MD  OVER THE COUNTER MEDICATION 2 capsules daily. PEENUTS   Yes Historical Provider, MD  sildenafil (VIAGRA) 100 MG tablet Take 1 tablet (100 mg total) by mouth as needed for erectile dysfunction. 06/14/15  Yes Margarita Rana, MD  tadalafil (CIALIS) 20 MG tablet 1 tab 1 hour prior to intercourse 03/25/15  Yes Historical Provider, MD  tamsulosin (FLOMAX) 0.4 MG CAPS capsule Take 1 capsule (0.4 mg total) by mouth daily. For Urination. 06/22/14  Yes Alexis Frock, MD  TAURINE PO Take 1,000 mg by mouth daily.    Yes Historical Provider, MD  testosterone (ANDROGEL) 50 MG/5GM (1%) GEL Place 10 g onto the skin daily. 04/13/15  Yes Margarita Rana, MD  thyroid (ARMOUR) 90  MG tablet Take 90 mg by mouth daily.   Yes Historical Provider, MD    Allergies as of 04/26/2015  . (No Known Allergies)    Family History  Problem Relation Age of Onset  . Alcohol abuse Mother   . Diabetes Mother   . Mental retardation Mother     Social History   Social History  . Marital Status: Married    Spouse Name: N/A  . Number of Children: 2  . Years of Education: N/A   Occupational History  . textiles    Social History Main Topics  . Smoking status: Former Smoker -- 1.00 packs/day for 15 years    Types: Cigarettes    Quit date: 06/15/1983  . Smokeless tobacco: Current User    Types: Chew  . Alcohol Use: No     Comment: quit 10-2007  . Drug Use: No  . Sexual Activity: Not on file   Other Topics Concern  . Not on file   Social History Narrative   [employer] graduate   [employer] hunter   Very active   Walks 1 hour daily    Review of Systems: See HPI, otherwise negative ROS  Physical Exam: BP 152/71 mmHg  Pulse 74  Temp(Src) 98.1 F (36.7 C) (Tympanic)  Resp 16  Ht 6' (1.829 m)  Wt 170 lb (77.111 kg)  BMI 23.05 kg/m2  SpO2 100% General:   Alert,  pleasant and cooperative in NAD Head:  Normocephalic and atraumatic. Neck:  Supple; no masses or thyromegaly. Lungs:  Clear throughout to auscultation.    Heart:  Regular rate and rhythm. Abdomen:  Soft, nontender and nondistended. Normal bowel sounds, without guarding, and without rebound.   Neurologic:  Alert and  oriented x4;  grossly normal neurologically.  Impression/Plan: Tony Luna is now here to undergo a screening colonoscopy.  Risks, benefits, and alternatives regarding colonoscopy have been reviewed with the patient.  Questions have been answered.  All parties agreeable.

## 2015-07-19 NOTE — Anesthesia Preprocedure Evaluation (Signed)
Anesthesia Evaluation  Patient identified by MRN, date of birth, ID band Patient awake    Reviewed: Allergy & Precautions, H&P , NPO status , Patient's Chart, lab work & pertinent test results  History of Anesthesia Complications (+) history of anesthetic complications  Airway Mallampati: III  TM Distance: >3 FB Neck ROM: limited    Dental  (+) Poor Dentition   Pulmonary neg shortness of breath, pneumonia, resolved, former smoker,    Pulmonary exam normal breath sounds clear to auscultation       Cardiovascular Exercise Tolerance: Good (-) angina(-) DOE Normal cardiovascular exam+ dysrhythmias  Rhythm:regular Rate:Normal     Neuro/Psych negative neurological ROS  negative psych ROS   GI/Hepatic negative GI ROS, Neg liver ROS,   Endo/Other  Hypothyroidism   Renal/GU negative Renal ROS  negative genitourinary   Musculoskeletal  (+) Arthritis ,   Abdominal   Peds  Hematology negative hematology ROS (+)   Anesthesia Other Findings Past Medical History:   Arthritis                                                    Allergy                                                      Esophageal tear                                              Rotator cuff tear                                            Complication of anesthesia                                     Comment::"need catheter"   Dysrhythmia                                                    Comment:? something told by reg dr  unable to say what               it was   Hypothyroidism                                               Pneumonia                                                      Comment:hx   Primary osteoarthritis of  left knee             06/22/2014   Past Surgical History:   KNEE SURGERY                                                    Comment:? loose body/ chondral defect 64yrs   ROTATOR CUFF REPAIR                                          Comment:left   TONSILLECTOMY                                                 SPINE SURGERY                                                 HERNIA REPAIR                                   Left                Comment:inguinal herniorrhapy   CAUTERY OF TURBINATES                                         blepharaplasty                                                TOTAL KNEE ARTHROPLASTY                         Left 06/22/2014      Comment:Procedure: TOTAL KNEE ARTHROPLASTY;  Surgeon:               [name], MD;  Location: [place];                Service: Orthopedics;  Laterality: Left;  BMI    Body Mass Index   23.05 kg/m 2      Reproductive/Obstetrics negative OB ROS                             Anesthesia Physical Anesthesia Plan  ASA: III  Anesthesia Plan: General   Post-op Pain Management:    Induction:   Airway Management Planned:   Additional Equipment:   Intra-op Plan:   Post-operative Plan:   Informed Consent: I have reviewed the patients History and Physical, chart, labs and discussed the procedure including the risks, benefits and alternatives for the proposed anesthesia with the patient or authorized representative who has indicated his/her understanding and acceptance.   Dental Advisory Given  Plan Discussed with: Anesthesiologist, CRNA and Surgeon  Anesthesia Plan Comments:         Anesthesia Quick Evaluation

## 2015-07-19 NOTE — Anesthesia Postprocedure Evaluation (Signed)
Anesthesia Post Note  Patient: Tony Luna  Procedure(s) Performed: Procedure(s) (LRB): COLONOSCOPY WITH PROPOFOL (N/A)  Patient location during evaluation: Endoscopy Anesthesia Type: General Level of consciousness: awake and alert Pain management: pain level controlled Vital Signs Assessment: post-procedure vital signs reviewed and stable Respiratory status: spontaneous breathing, nonlabored ventilation, respiratory function stable and patient connected to nasal cannula oxygen Cardiovascular status: blood pressure returned to baseline and stable Postop Assessment: no signs of nausea or vomiting Anesthetic complications: no    Last Vitals:  Filed Vitals:   07/19/15 0830 07/19/15 0840  BP: 120/65 130/67  Pulse: 78 72  Temp:    Resp: 16 16    Last Pain: There were no vitals filed for this visit.               Tony Luna

## 2015-07-19 NOTE — Transfer of Care (Signed)
Immediate Anesthesia Transfer of Care Note  Patient: Tony Luna  Procedure(s) Performed: Procedure(s): COLONOSCOPY WITH PROPOFOL (N/A)  Patient Location: PACU  Anesthesia Type:General  Level of Consciousness: awake, alert  and oriented  Airway & Oxygen Therapy: Patient Spontanous Breathing and Patient connected to nasal cannula oxygen  Post-op Assessment: Report given to RN and Post -op Vital signs reviewed and stable  Post vital signs: Reviewed and stable  Last Vitals:  Filed Vitals:   07/19/15 0807 07/19/15 0808  BP:  87/44  Pulse:  66  Temp: 36.1 C   Resp:  15    Complications: No apparent anesthesia complications

## 2015-07-19 NOTE — Op Note (Signed)
[facility] Gastroenterology Patient Name: Tony Luna Procedure Date: 07/19/2015 7:32 AM MRN: [id number] Account #: [id number] Date of Birth: 08/13/1940 Admit Type: Outpatient Age: 75 Room: [facility] ENDO ROOM 4 Gender: Male Note Status: Finalized Procedure:            Colonoscopy Indications:          Screening for colorectal malignant neoplasm Providers:            [name], MD Referring MD:         Jerrell Belfast, MD (Referring MD) Medicines:            Propofol per Anesthesia Complications:        No immediate complications. Procedure:            Pre-Anesthesia Assessment:                       - Prior to the procedure, a History and Physical was                        performed, and patient medications and allergies were                        reviewed. The patient's tolerance of previous                        anesthesia was also reviewed. The risks and benefits of                        the procedure and the sedation options and risks were                        discussed with the patient. All questions were                        answered, and informed consent was obtained. Prior                        Anticoagulants: The patient has taken no previous                        anticoagulant or antiplatelet agents. ASA Grade                        Assessment: II - A patient with mild systemic disease.                        After reviewing the risks and benefits, the patient was                        deemed in satisfactory condition to undergo the                        procedure.                       After obtaining informed consent, the colonoscope was                        passed under direct vision. Throughout the procedure,  the patient's blood pressure, pulse, and oxygen                        saturations were monitored continuously. The                        Colonoscope was introduced through the anus and     advanced to the the cecum, identified by appendiceal                        orifice and ileocecal valve. The colonoscopy was                        performed without difficulty. The patient tolerated the                        procedure well. The quality of the bowel preparation                        was excellent. Findings:      The perianal and digital rectal examinations were normal.      A continuous area of nonbleeding ulcerated mucosa with no stigmata of       recent bleeding was present at the ileocecal valve. Biopsies were taken       with a cold forceps for histology.      A 10 mm polyp was found in the hepatic flexure. The polyp was sessile.       The polyp was removed with a hot snare. Resection and retrieval were       complete. Two bands were successfully placed. There was no bleeding       during the procedure.      Non-bleeding internal hemorrhoids were found during retroflexion. The       hemorrhoids were Grade I (internal hemorrhoids that do not prolapse). Impression:           - Mucosal ulceration. Biopsied.                       - One 10 mm polyp at the hepatic flexure, removed with                        a hot snare. Resected and retrieved. Banded.                       - Non-bleeding internal hemorrhoids. Recommendation:       - Await pathology results.                       - Repeat colonoscopy in 5 years if polyp adenoma and 10                        years if hyperplastic Procedure Code(s):    --- Professional ---                       [id number], Colonoscopy, flexible; with removal of tumor(s),                        polyp(s), or other lesion(s) by snare technique  [id number], 93, Colonoscopy, flexible; with biopsy, single                        or multiple Diagnosis Code(s):    --- Professional ---                       Z12.11, Encounter for screening for malignant neoplasm                        of colon                       D12.3, Benign  neoplasm of transverse colon (hepatic                        flexure or splenic flexure)                       K63.3, Ulcer of intestine CPT copyright 2016 American Medical Association. All rights reserved. The codes documented in this report are preliminary and upon coder review may  be revised to meet current compliance requirements. [name], MD 07/19/2015 8:05:19 AM This report has been signed electronically. Number of Addenda: 0 Note Initiated On: 07/19/2015 7:32 AM Scope Withdrawal Time: 0 hours 15 minutes 21 seconds  Total Procedure Duration: 0 hours 18 minutes 38 seconds       [facility]

## 2015-07-20 ENCOUNTER — Encounter: Payer: Self-pay | Admitting: Gastroenterology

## 2015-07-20 LAB — SURGICAL PATHOLOGY

## 2015-07-21 ENCOUNTER — Encounter: Payer: Self-pay | Admitting: Gastroenterology

## 2015-07-24 DIAGNOSIS — R05 Cough: Secondary | ICD-10-CM | POA: Diagnosis not present

## 2015-07-24 DIAGNOSIS — J209 Acute bronchitis, unspecified: Secondary | ICD-10-CM | POA: Diagnosis not present

## 2015-07-28 DIAGNOSIS — M961 Postlaminectomy syndrome, not elsewhere classified: Secondary | ICD-10-CM | POA: Diagnosis not present

## 2015-07-28 DIAGNOSIS — M25562 Pain in left knee: Secondary | ICD-10-CM | POA: Diagnosis not present

## 2015-07-28 DIAGNOSIS — M5137 Other intervertebral disc degeneration, lumbosacral region: Secondary | ICD-10-CM | POA: Diagnosis not present

## 2015-08-03 DIAGNOSIS — M5137 Other intervertebral disc degeneration, lumbosacral region: Secondary | ICD-10-CM | POA: Diagnosis not present

## 2015-08-03 DIAGNOSIS — M25562 Pain in left knee: Secondary | ICD-10-CM | POA: Diagnosis not present

## 2015-08-16 DIAGNOSIS — M25562 Pain in left knee: Secondary | ICD-10-CM | POA: Diagnosis not present

## 2015-08-16 DIAGNOSIS — M5137 Other intervertebral disc degeneration, lumbosacral region: Secondary | ICD-10-CM | POA: Diagnosis not present

## 2015-08-26 ENCOUNTER — Other Ambulatory Visit: Payer: Self-pay

## 2015-08-26 NOTE — Patient Outreach (Signed)
[facility] [facility]) Care Management  08/26/2015  Tony Luna 1941-01-02 [id number]   Telephone Screen  Referral Date: 08/25/15 Referral Source: HTA Tier 3 List Referral Reason: 0 admission, 2 ER visits   Outreach attempt #1 to patient. No answer at present. RN CM left HIPAA compliant voicemail message along with contact info.   Plan: RN CM will make outreach attempt to patient within a week.  [name], RN,BSN,CCM [place] Management Telephonic Care Management Coordinator Direct Phone: [phone] Toll Free: [phone] Fax: [phone]

## 2015-08-29 ENCOUNTER — Other Ambulatory Visit: Payer: Self-pay

## 2015-08-29 DIAGNOSIS — M5137 Other intervertebral disc degeneration, lumbosacral region: Secondary | ICD-10-CM | POA: Diagnosis not present

## 2015-08-29 DIAGNOSIS — M25562 Pain in left knee: Secondary | ICD-10-CM | POA: Diagnosis not present

## 2015-08-29 NOTE — Patient Outreach (Signed)
[place] [facility]) Care Management  08/29/2015  CHRITOPHER UMANA 04/10/1941 [id number]   Telephone Screen  Referral Date: 08/25/15 Referral Source: HTA Tier 3 List Referral Reason: 0 admission, 2 ER visits   Voicemail message received from patient. Return call placed to patient. No answer. RN CM left HIPAA compliant voicemail message along with contact info.    Plan: RN CM will make outreach attempt to patient within three business days.   [name], RN,BSN,CCM [place] Management Telephonic Care Management Coordinator Direct Phone: [phone] Toll Free: [phone] Fax: [phone]

## 2015-08-30 ENCOUNTER — Ambulatory Visit: Payer: Self-pay

## 2015-09-05 ENCOUNTER — Ambulatory Visit: Payer: Self-pay

## 2015-09-05 ENCOUNTER — Other Ambulatory Visit: Payer: Self-pay

## 2015-09-05 NOTE — Patient Outreach (Signed)
[facility] [facility]) Care Management  09/05/2015  VENSON STUMBO Jul 15, 1940 [id number]   Telephone Screen  Referral Date: 08/25/15 Referral Source: HTA Tier 3 List Referral Reason: 0 admission, 2 ER visits   Outreach attempt #3 to patient. No answer. RN CM left HIPAA compliant voicemail message along with contact info.   Plan: RN CM will send unsuccessful outreach letter to patient and close case if no response from patient within 10 business days.  [name], RN,BSN,CCM [place] Management Telephonic Care Management Coordinator Direct Phone: [phone] Toll Free: [phone] Fax: [phone]

## 2015-09-06 ENCOUNTER — Other Ambulatory Visit: Payer: Self-pay | Admitting: Family Medicine

## 2015-09-06 DIAGNOSIS — E349 Endocrine disorder, unspecified: Secondary | ICD-10-CM

## 2015-09-07 ENCOUNTER — Telehealth: Payer: Self-pay | Admitting: Family Medicine

## 2015-09-07 ENCOUNTER — Other Ambulatory Visit: Payer: Self-pay

## 2015-09-07 NOTE — Telephone Encounter (Signed)
Ok to refer. Thanks

## 2015-09-07 NOTE — Telephone Encounter (Signed)
Pt called saying he is having right shoulder pain.  He stated he has talked to you about it before.  He woulf like for you to refer him to [facility] for PT.  He says it interferes with his workout.  His call back [phone]  He has Medicare. And  HTA.  Thanks Con Memos

## 2015-09-07 NOTE — Patient Outreach (Signed)
[place] [facility]) Care Management  09/07/2015  Tony Luna 1940-12-15 [id number]   Telephone Screen  Referral Date: 08/25/15 Referral Source: HTA Tier 3 List Referral Reason: 0 admission, 2 ER visits   Voicemail message received from patient. Return call placed to patient. Patient states that he is doing "just excellent." He lives with spouse. He is independent with ADLs/IADLs. Patient drives himself to medical appts. He denies any issues with affording meds. He reports that he sees an Arts administrator medicine MD" and takes a lot of supplements and vitamins to help him stay healthy. Patient reports that he is very active and stays on the go often including going to gym. Discussed and reviewed [facility] services. Patient reports being familiar with services as he received mailed info last year. He denies any RN CM needs or concerns at this time. Patient appreciative of call. Advised patient that RN CM had mailed other additional [place] to him which he should be receiving soon and to keep for future reference. He voiced understanding.   Plan: RN CM will notify [facility] administrative assistant of case closure. RN CM will send MD case closure letter.  [name], RN,BSN,CCM [place] Management Telephonic Care Management Coordinator Direct Phone: [phone] Toll Free: [phone] Fax: [phone]'

## 2015-09-07 NOTE — Telephone Encounter (Signed)
Please review-aa 

## 2015-09-08 ENCOUNTER — Other Ambulatory Visit: Payer: Self-pay

## 2015-09-08 DIAGNOSIS — Z96652 Presence of left artificial knee joint: Secondary | ICD-10-CM

## 2015-09-08 NOTE — Telephone Encounter (Signed)
Spoke with patient and order put in chart.  ED

## 2015-09-15 DIAGNOSIS — D2272 Melanocytic nevi of left lower limb, including hip: Secondary | ICD-10-CM | POA: Diagnosis not present

## 2015-09-15 DIAGNOSIS — B360 Pityriasis versicolor: Secondary | ICD-10-CM | POA: Diagnosis not present

## 2015-09-15 DIAGNOSIS — M5137 Other intervertebral disc degeneration, lumbosacral region: Secondary | ICD-10-CM | POA: Diagnosis not present

## 2015-09-15 DIAGNOSIS — M25562 Pain in left knee: Secondary | ICD-10-CM | POA: Diagnosis not present

## 2015-09-15 DIAGNOSIS — D225 Melanocytic nevi of trunk: Secondary | ICD-10-CM | POA: Diagnosis not present

## 2015-09-15 DIAGNOSIS — L570 Actinic keratosis: Secondary | ICD-10-CM | POA: Diagnosis not present

## 2015-09-15 DIAGNOSIS — X32XXXA Exposure to sunlight, initial encounter: Secondary | ICD-10-CM | POA: Diagnosis not present

## 2015-09-15 DIAGNOSIS — Z85828 Personal history of other malignant neoplasm of skin: Secondary | ICD-10-CM | POA: Diagnosis not present

## 2015-09-20 ENCOUNTER — Telehealth: Payer: Self-pay | Admitting: Family Medicine

## 2015-09-20 DIAGNOSIS — M7541 Impingement syndrome of right shoulder: Secondary | ICD-10-CM | POA: Diagnosis not present

## 2015-09-20 DIAGNOSIS — M25511 Pain in right shoulder: Secondary | ICD-10-CM | POA: Diagnosis not present

## 2015-09-20 NOTE — Telephone Encounter (Signed)
Ok to order. Thanks.   

## 2015-09-20 NOTE — Telephone Encounter (Signed)
Order faxed.

## 2015-09-20 NOTE — Telephone Encounter (Signed)
Ok to order? Please advise. Thanks!  

## 2015-09-20 NOTE — Telephone Encounter (Signed)
They are waiting on an order for PT foe right shoulder pain and stiffness.  He has an appt today.  Thanks Con Memos

## 2015-09-20 NOTE — Telephone Encounter (Signed)
[name], do I send this order to you? Please advise. Thanks!

## 2015-09-23 DIAGNOSIS — S61219A Laceration without foreign body of unspecified finger without damage to nail, initial encounter: Secondary | ICD-10-CM | POA: Diagnosis not present

## 2015-09-23 DIAGNOSIS — S61211A Laceration without foreign body of left index finger without damage to nail, initial encounter: Secondary | ICD-10-CM | POA: Diagnosis not present

## 2015-09-23 DIAGNOSIS — W290XXA Contact with powered kitchen appliance, initial encounter: Secondary | ICD-10-CM | POA: Diagnosis not present

## 2015-09-23 DIAGNOSIS — S61311A Laceration without foreign body of left index finger with damage to nail, initial encounter: Secondary | ICD-10-CM | POA: Diagnosis not present

## 2015-09-28 DIAGNOSIS — M5137 Other intervertebral disc degeneration, lumbosacral region: Secondary | ICD-10-CM | POA: Diagnosis not present

## 2015-09-28 DIAGNOSIS — M25562 Pain in left knee: Secondary | ICD-10-CM | POA: Diagnosis not present

## 2015-09-28 DIAGNOSIS — M961 Postlaminectomy syndrome, not elsewhere classified: Secondary | ICD-10-CM | POA: Diagnosis not present

## 2015-10-10 DIAGNOSIS — M25562 Pain in left knee: Secondary | ICD-10-CM | POA: Diagnosis not present

## 2015-10-10 DIAGNOSIS — M5137 Other intervertebral disc degeneration, lumbosacral region: Secondary | ICD-10-CM | POA: Diagnosis not present

## 2015-10-18 DIAGNOSIS — M25562 Pain in left knee: Secondary | ICD-10-CM | POA: Diagnosis not present

## 2015-10-18 DIAGNOSIS — M5137 Other intervertebral disc degeneration, lumbosacral region: Secondary | ICD-10-CM | POA: Diagnosis not present

## 2015-11-03 DIAGNOSIS — M5137 Other intervertebral disc degeneration, lumbosacral region: Secondary | ICD-10-CM | POA: Diagnosis not present

## 2015-11-03 DIAGNOSIS — M25562 Pain in left knee: Secondary | ICD-10-CM | POA: Diagnosis not present

## 2015-12-08 LAB — HEPATIC FUNCTION PANEL
ALT: 29 U/L (ref 10–40)
AST: 28 U/L (ref 14–40)
Alkaline Phosphatase: 89 U/L (ref 25–125)
Bilirubin, Total: 0.8 mg/dL

## 2015-12-08 LAB — HM COLONOSCOPY

## 2015-12-08 LAB — BASIC METABOLIC PANEL
BUN: 17 mg/dL (ref 4–21)
Creatinine: 1.1 mg/dL (ref 0.6–1.3)
Glucose: 96 mg/dL
Potassium: 4.4 mmol/L (ref 3.4–5.3)
Sodium: 144 mmol/L (ref 137–147)

## 2015-12-08 LAB — LIPID PANEL
Cholesterol: 152 mg/dL (ref 0–200)
HDL: 60 mg/dL (ref 35–70)
LDL Cholesterol: 80 mg/dL
Triglycerides: 62 mg/dL (ref 40–160)

## 2015-12-08 LAB — CBC AND DIFFERENTIAL
HCT: 41 % (ref 41–53)
Hemoglobin: 14.3 g/dL (ref 13.5–17.5)
Platelets: 106 10*3/uL — AB (ref 150–399)
WBC: 4.8 10^3/mL

## 2015-12-08 LAB — PSA: PSA: 0.9

## 2015-12-08 LAB — TSH: TSH: 5.1 u[IU]/mL (ref 0.41–5.90)

## 2015-12-08 LAB — HEMOGLOBIN A1C: Hemoglobin A1C: 5

## 2015-12-13 ENCOUNTER — Ambulatory Visit (INDEPENDENT_AMBULATORY_CARE_PROVIDER_SITE_OTHER): Payer: PPO | Admitting: Internal Medicine

## 2015-12-13 ENCOUNTER — Encounter: Payer: Self-pay | Admitting: Internal Medicine

## 2015-12-13 VITALS — BP 164/80 | HR 77 | Temp 98.1°F | Resp 12 | Ht 71.5 in | Wt 171.5 lb

## 2015-12-13 DIAGNOSIS — E349 Endocrine disorder, unspecified: Secondary | ICD-10-CM

## 2015-12-13 DIAGNOSIS — I10 Essential (primary) hypertension: Secondary | ICD-10-CM

## 2015-12-13 DIAGNOSIS — N401 Enlarged prostate with lower urinary tract symptoms: Secondary | ICD-10-CM | POA: Diagnosis not present

## 2015-12-13 DIAGNOSIS — Z23 Encounter for immunization: Secondary | ICD-10-CM | POA: Diagnosis not present

## 2015-12-13 DIAGNOSIS — D126 Benign neoplasm of colon, unspecified: Secondary | ICD-10-CM | POA: Diagnosis not present

## 2015-12-13 DIAGNOSIS — D696 Thrombocytopenia, unspecified: Secondary | ICD-10-CM

## 2015-12-13 DIAGNOSIS — E291 Testicular hypofunction: Secondary | ICD-10-CM

## 2015-12-13 DIAGNOSIS — K7469 Other cirrhosis of liver: Secondary | ICD-10-CM

## 2015-12-13 DIAGNOSIS — N138 Other obstructive and reflux uropathy: Secondary | ICD-10-CM

## 2015-12-13 DIAGNOSIS — R161 Splenomegaly, not elsewhere classified: Secondary | ICD-10-CM

## 2015-12-13 NOTE — Progress Notes (Signed)
Subjective:  Patient ID: Tony Luna, male    DOB: 19-Mar-1941  Age: 75 y.o. MRN: [id number]  CC: The primary encounter diagnosis was Other cirrhosis of liver ([place]). Diagnoses of Benign prostatic hyperplasia with urinary obstruction, Hypotestosteronism, Tubular adenoma of colon, Thrombocytopenia ([place]), Essential hypertension, and Splenomegaly were also pertinent to this visit.  HPI Tony Luna presents for establishment of care.  He was previously under the care of Margarita Rana for DJD . He is a retired Event organiser,  Has been retired for 6 years but has remained physically active hunting and working out  Former smoker Has abstained from alcohol since CT of abdomen in 2009 done for evaluation of thrombocytopenia suggested cirrhosis.  No further workup was done.   Sees Stoioff at [facility] for BPH and ED .  Treated with flomax initially by other MD.  Durene Cal to [facility] .  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  Now receiving Androgel and Viagra from  Urology as well.  Discussed the role of each medication and clarified their effect on performance. .   Left knee replacement one year ago   White coat hypertension.  Home BP's [id number] to [id number] systolic/67-75   Colonoscopy every 5 years.   Wife diagnosed with Lymes Disease a few years ago,  He started seeing integrative medicine physician with his wife in [place] and takes a variety of supplements including Armour thyroid .    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   He has past surgical history that includes Knee surgery; Rotator cuff repair; Tonsillectomy; Spine surgery; Hernia repair (Left); Cautery of turbinates; blepharaplasty; Total knee arthroplasty (Left, 06/22/2014); and Colonoscopy with propofol (N/A, 07/19/2015).   His family history  includes Alcohol abuse in his mother; Diabetes in his mother; Mental retardation in his mother.He reports that he quit smoking about 32 years ago. His smoking use included Cigarettes. He has a 15 pack-year smoking history. His smokeless tobacco use includes Chew. He reports that he does not drink alcohol or use illicit drugs.  Outpatient Prescriptions Prior to Visit  Medication Sig Dispense Refill  . Bitter Melon POWD Take 1 tablet by mouth daily.    . Cholecalciferol (VITAMIN D-3 PO) Take 5,000 Units by mouth daily. 5,00000 u in the morning, 2,000 u in the evening    . Melatonin 3 MG TABS Take 10 mg by mouth at bedtime.     Marland Kitchen MILK THISTLE PO Take 1 tablet by mouth 2 (two) times daily.    . Multiple Vitamin (MULTIVITAMIN WITH MINERALS) TABS tablet Take 1 tablet by mouth 2 (two) times daily.    Marland Kitchen OVER THE COUNTER MEDICATION Take 4 sprays by mouth at bedtime. Secretropin oral spray - growth hormone    . OVER THE COUNTER MEDICATION Take 1 capsule by mouth 3 (three) times daily. Instaflex    . OVER THE COUNTER MEDICATION Take 1,000 mg by mouth daily. Turmeric with Meriva    . OVER THE COUNTER MEDICATION Take 3,000 mg by mouth daily. Omega Cure    . OVER THE COUNTER MEDICATION Take 250 mg by mouth daily. Bio Magnesium Citrate    . OVER THE COUNTER MEDICATION Take 20,000 mcg by mouth daily. Vitamin B12-methylfolate    . OVER THE COUNTER MEDICATION Take 1 tablet by mouth daily. Pure adrenal 400    .  OVER THE COUNTER MEDICATION Take 800 mcg by mouth daily. Active B Folate    . OVER THE COUNTER MEDICATION Inject 50 Units as directed daily. Semorelin    . OVER THE COUNTER MEDICATION 2 tablets daily. VitaPrime    . OVER THE COUNTER MEDICATION 1,000 mg daily. Super Bio-C buffered    . OVER THE COUNTER MEDICATION 1,000 mg daily. L'Arginine    . OVER THE COUNTER MEDICATION 2 capsules 2 (two) times daily. SLF Forte    . OVER THE COUNTER MEDICATION 1,000 mg. Myomin    . OVER THE COUNTER MEDICATION 2 capsules  daily. PEENUTS    . sildenafil (VIAGRA) 100 MG tablet Take 1 tablet (100 mg total) by mouth as needed for erectile dysfunction. 10 tablet 5  . tadalafil (CIALIS) 20 MG tablet 1 tab 1 hour prior to intercourse    . tamsulosin (FLOMAX) 0.4 MG CAPS capsule Take 1 capsule (0.4 mg total) by mouth daily. For Urination. 90 capsule 3  . TAURINE PO Take 1,000 mg by mouth daily.     Marland Kitchen testosterone (ANDROGEL) 50 MG/5GM (1%) GEL APPLY 2 PACKS (10GRAMS) ONTO THE SKIN ONCE DAILY. 300 g 3  . thyroid (ARMOUR) 90 MG tablet Take 90 mg by mouth daily.     No facility-administered medications prior to visit.    Review of Systems:  Patient denies headache, fevers, malaise, unintentional weight loss, skin rash, eye pain, sinus congestion and sinus pain, sore throat, dysphagia,  hemoptysis , cough, dyspnea, wheezing, chest pain, palpitations, orthopnea, edema, abdominal pain, nausea, melena, diarrhea, constipation, flank pain, dysuria, hematuria, urinary  Frequency, nocturia, numbness, tingling, seizures,  Focal weakness, Loss of consciousness,  Tremor, insomnia, depression, anxiety, and suicidal ideation.     Objective:  BP 164/80 mmHg  Pulse 77  Temp(Src) 98.1 F (36.7 C) (Oral)  Resp 12  Ht 5' 11.5" (1.816 m)  Wt 171 lb 8 oz (77.792 kg)  BMI 23.59 kg/m2  SpO2 97%  Physical Exam:   Assessment & Plan:   Problem List Items Addressed This Visit    Cirrhosis ([place]) - Primary    Suggested by CT of abdomen and pelvis in 2009,  No further workup or surveillance done.  He has mild thrombocytopenia by recent labs,  Normal liver enzymes.  He has abstained from alcohol since 2009 and takes mild thistle.  No prior vaccination for Hep A/B , vaccine given today. Referral to Liver clinic advised .       Relevant Orders   Hepatitis A hepatitis B combined vaccine IM (Completed)   Hepatitis B core antibody, total   Hepatitis B surface antibody   Hepatitis B surface antigen   Hepatitis C antibody   Iron and TIBC     US Abdomen Limited   ANA   Anti-Smith antibody   Ferritin   Hypotestosteronism    Managed with Stoioff with Androgel.  Recent low testosterone level discussed.  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        Benign prostatic hyperplasia with urinary obstruction    Managed with Flomax       Tubular adenoma of colon    By screening colonosocpy [facility]) Feb 2017,. 5 yr follow up recommended.       Essential hypertension    He has normal renal function and  reports that home readings are normotensive.  Advised to bring home cuff to next RN visit for calibration.        Other Visit Diagnoses    Splenomegaly           I am having Tony Luna maintain his thyroid, OVER THE COUNTER MEDICATION, OVER THE COUNTER MEDICATION, OVER THE COUNTER MEDICATION, multivitamin with minerals, OVER THE COUNTER MEDICATION, Cholecalciferol (VITAMIN D-3 PO), TAURINE PO, OVER THE COUNTER MEDICATION, OVER THE COUNTER MEDICATION, Bitter Melon, MILK THISTLE PO, OVER THE COUNTER MEDICATION, tamsulosin, Melatonin, tadalafil, OVER THE COUNTER MEDICATION, OVER THE COUNTER MEDICATION, OVER THE COUNTER MEDICATION, OVER THE COUNTER MEDICATION, OVER THE COUNTER MEDICATION, OVER THE COUNTER MEDICATION, OVER THE COUNTER MEDICATION, OVER THE COUNTER MEDICATION, sildenafil, and testosterone.  No orders of the defined types were placed in this encounter.    There are no discontinued medications.  Follow-up: Return in about 4 months (around 04/14/2016).   [name], MD

## 2015-12-13 NOTE — Patient Instructions (Addendum)
NICE TO MEET YOU!  THANK YOU FOR BEING SO ORGANIZED!!    We started your vaccination series for Hepatitis A and B today  Your 2nd dose will be due in one month ,  And your final one is due in January  You can have your final Penumonvax vaccine when you return in Nov/Dec for your annual CPE  Referral to Liver specialist is in progress

## 2015-12-13 NOTE — Progress Notes (Signed)
Pre-visit discussion using our clinic review tool. No additional management support is needed unless otherwise documented below in the visit note.  

## 2015-12-15 ENCOUNTER — Telehealth: Payer: Self-pay | Admitting: Internal Medicine

## 2015-12-15 ENCOUNTER — Other Ambulatory Visit: Payer: Self-pay

## 2015-12-15 DIAGNOSIS — R030 Elevated blood-pressure reading, without diagnosis of hypertension: Secondary | ICD-10-CM | POA: Insufficient documentation

## 2015-12-15 NOTE — Assessment & Plan Note (Signed)
Managed with Stoioff with Androgel.  Recent low testosterone level discussed.  Less is more approach advocated given concurrent use of viagra and possible cirrhosis diagnosis

## 2015-12-15 NOTE — Assessment & Plan Note (Signed)
By screening colonosocpy [facility]) Feb 2017,. 5 yr follow up recommended.

## 2015-12-15 NOTE — Assessment & Plan Note (Signed)
He has normal renal function and reports that home readings are normotensive.  Advised to bring home cuff to next RN visit for calibration.

## 2015-12-15 NOTE — Telephone Encounter (Signed)
Before I send Mr Tony Luna to the Liver clinic, I would like to repeat imaging of his liver with an ultrasound (which hasn't been done ever,  Just a CT in 2009) and order serologies which have never been done either.  All are ordered.

## 2015-12-15 NOTE — Telephone Encounter (Signed)
Attempted to reach patient, left a VM to return a call to me. thanks

## 2015-12-15 NOTE — Assessment & Plan Note (Signed)
Managed with Flomax  

## 2015-12-15 NOTE — Assessment & Plan Note (Addendum)
Suggested by CT of abdomen and pelvis in 2009,  No further workup or surveillance done.  He has mild thrombocytopenia by recent labs,  Normal liver enzymes.  He has abstained from alcohol since 2009 and takes mild thistle.  No prior vaccination for Hep A/B , vaccine given today. Referral to Liver clinic advised .

## 2015-12-15 NOTE — Assessment & Plan Note (Addendum)
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

## 2015-12-16 ENCOUNTER — Other Ambulatory Visit: Payer: Self-pay

## 2015-12-16 DIAGNOSIS — K7460 Unspecified cirrhosis of liver: Secondary | ICD-10-CM

## 2015-12-19 ENCOUNTER — Other Ambulatory Visit: Payer: Self-pay | Admitting: Surgical

## 2015-12-19 MED ORDER — TAMSULOSIN HCL 0.4 MG PO CAPS: 0.4000 mg | ORAL_CAPSULE | Freq: Every day | ORAL | 3 refills | Status: DC

## 2015-12-19 NOTE — Telephone Encounter (Signed)
This was prescribed by Urology. Can we refill this? Last appointment 12/13/15

## 2015-12-20 ENCOUNTER — Ambulatory Visit
Admission: RE | Admit: 2015-12-20 | Discharge: 2015-12-20 | Disposition: A | Discharge status: Home / Self Care | Payer: PPO | Source: Ambulatory Visit | Attending: Internal Medicine | Admitting: Internal Medicine

## 2015-12-20 DIAGNOSIS — K7460 Unspecified cirrhosis of liver: Secondary | ICD-10-CM | POA: Insufficient documentation

## 2015-12-21 ENCOUNTER — Telehealth: Payer: Self-pay | Admitting: Internal Medicine

## 2015-12-21 NOTE — Telephone Encounter (Signed)
Ultrasound of liver is still suggestive of cirrhosis.  I have ordered additional labs to rule out various causes. Please offer him an appt if this has not been done.

## 2015-12-22 NOTE — Telephone Encounter (Signed)
Next appointment is 01/17/2016. Would you like to see him sooner? Renaldo Fiddler, CMA

## 2015-12-22 NOTE — Telephone Encounter (Signed)
Tony Luna, CMA  

## 2015-12-22 NOTE — Telephone Encounter (Signed)
No, but he needs to have the labs done ASAP so I can make the Liver clinic referral

## 2015-12-26 ENCOUNTER — Other Ambulatory Visit (INDEPENDENT_AMBULATORY_CARE_PROVIDER_SITE_OTHER): Payer: PPO

## 2015-12-26 DIAGNOSIS — K7469 Other cirrhosis of liver: Secondary | ICD-10-CM | POA: Diagnosis not present

## 2015-12-26 LAB — IRON AND TIBC
%SAT: 33 % (ref 15–60)
Iron: 118 ug/dL (ref 50–180)
TIBC: 354 ug/dL (ref 250–425)
UIBC: 236 ug/dL (ref 125–400)

## 2015-12-27 ENCOUNTER — Other Ambulatory Visit: Payer: Self-pay | Admitting: Internal Medicine

## 2015-12-27 ENCOUNTER — Encounter: Payer: Self-pay | Admitting: Internal Medicine

## 2015-12-27 DIAGNOSIS — K7469 Other cirrhosis of liver: Secondary | ICD-10-CM

## 2015-12-27 DIAGNOSIS — M5137 Other intervertebral disc degeneration, lumbosacral region: Secondary | ICD-10-CM | POA: Diagnosis not present

## 2015-12-27 DIAGNOSIS — M25562 Pain in left knee: Secondary | ICD-10-CM | POA: Diagnosis not present

## 2015-12-27 LAB — HEPATITIS B SURFACE ANTIBODY,QUALITATIVE: Hep B S Ab: NEGATIVE

## 2015-12-27 LAB — HEPATITIS C ANTIBODY: HCV Ab: NEGATIVE

## 2015-12-27 LAB — FERRITIN: Ferritin: 67.8 ng/mL (ref 22.0–322.0)

## 2015-12-27 LAB — ANA: Anti Nuclear Antibody(ANA): NEGATIVE

## 2015-12-27 LAB — ANTI-SMITH ANTIBODY: ENA SM Ab Ser-aCnc: <1

## 2015-12-27 LAB — HEPATITIS B SURFACE ANTIGEN: Hepatitis B Surface Ag: NEGATIVE

## 2015-12-27 LAB — HEPATITIS B CORE ANTIBODY, TOTAL: Hep B Core Total Ab: NONREACTIVE

## 2015-12-28 DIAGNOSIS — E291 Testicular hypofunction: Secondary | ICD-10-CM | POA: Diagnosis not present

## 2016-01-01 DIAGNOSIS — M79671 Pain in right foot: Secondary | ICD-10-CM | POA: Diagnosis not present

## 2016-01-02 DIAGNOSIS — Z96652 Presence of left artificial knee joint: Secondary | ICD-10-CM | POA: Diagnosis not present

## 2016-01-02 DIAGNOSIS — M7741 Metatarsalgia, right foot: Secondary | ICD-10-CM | POA: Diagnosis not present

## 2016-01-03 ENCOUNTER — Encounter: Payer: Self-pay | Admitting: Internal Medicine

## 2016-01-04 ENCOUNTER — Telehealth: Payer: Self-pay | Admitting: *Deleted

## 2016-01-04 DIAGNOSIS — M25562 Pain in left knee: Secondary | ICD-10-CM | POA: Diagnosis not present

## 2016-01-04 DIAGNOSIS — M5137 Other intervertebral disc degeneration, lumbosacral region: Secondary | ICD-10-CM | POA: Diagnosis not present

## 2016-01-04 NOTE — Telephone Encounter (Signed)
Patient requested lab results from 07/31

## 2016-01-04 NOTE — Telephone Encounter (Signed)
Called pt, advised him of MyChart message sent to him on 8/1 by Dr Derrel Nip.

## 2016-01-05 ENCOUNTER — Encounter: Payer: Self-pay | Admitting: Internal Medicine

## 2016-01-05 ENCOUNTER — Other Ambulatory Visit: Payer: Self-pay | Admitting: Internal Medicine

## 2016-01-06 ENCOUNTER — Telehealth: Payer: Self-pay | Admitting: *Deleted

## 2016-01-06 ENCOUNTER — Encounter: Payer: Self-pay | Admitting: Internal Medicine

## 2016-01-06 ENCOUNTER — Ambulatory Visit (INDEPENDENT_AMBULATORY_CARE_PROVIDER_SITE_OTHER): Payer: PPO | Admitting: Internal Medicine

## 2016-01-06 DIAGNOSIS — E291 Testicular hypofunction: Secondary | ICD-10-CM

## 2016-01-06 DIAGNOSIS — M84377D Stress fracture, right toe(s), subsequent encounter for fracture with routine healing: Secondary | ICD-10-CM | POA: Diagnosis not present

## 2016-01-06 DIAGNOSIS — E349 Endocrine disorder, unspecified: Secondary | ICD-10-CM

## 2016-01-06 DIAGNOSIS — M84374D Stress fracture, right foot, subsequent encounter for fracture with routine healing: Secondary | ICD-10-CM

## 2016-01-06 DIAGNOSIS — K7460 Unspecified cirrhosis of liver: Secondary | ICD-10-CM | POA: Diagnosis not present

## 2016-01-06 NOTE — Telephone Encounter (Signed)
Patient called and states he got a procedure done in [place]. ( testerostone pellet implant) Patient states he has one stitch that he was suppose to take out himself per d/c instruction. Patient states he can't see the stitch and wanted to know if Dr.Tullo can take the stitch out for him. Patient is requesting a call back. His number is [phone]. Thanks

## 2016-01-06 NOTE — Progress Notes (Signed)
Pre visit review using our clinic review tool, if applicable. No additional management support is needed unless otherwise documented below in the visit note. 

## 2016-01-06 NOTE — Progress Notes (Signed)
Subjective:  Patient ID: Tony Luna, male    DOB: Mar 23, 1941  Age: 75 y.o. MRN: [id number]  CC: Diagnoses of Hypotestosteronism, Cirrhosis of liver without ascites, unspecified hepatic cirrhosis type ([place]), and Stress fracture of metatarsal due to multiple or repetitive stress, right, with routine healing, subsequent encounter were pertinent to this visit.  HPI CLIFFTON SWING presents for follow up on recent abdominal ultrasound results and 2)  removal of suture from right flank that was placed last week by Urology  During implantation of  testosterone pellets.  He feels fine except for some mid foot pain that was determined recently  to be a stress fracture by orthopedics .   Abdominal ultrasound suggestive of cirrhosis with no evidence of varices.  He was  Told in 2009 that he had "liver problems" and became alcohol abstinent.  He continues to follow a sensible diet , avoids alcohol and tylenol products.  Exercises vigorously .    Outpatient Medications Prior to Visit  Medication Sig Dispense Refill  . Bitter Melon POWD Take 1 tablet by mouth daily.    . Cholecalciferol (VITAMIN D-3 PO) Take 5,000 Units by mouth daily. 5,00000 u in the morning, 2,000 u in the evening    . Melatonin 3 MG TABS Take 10 mg by mouth at bedtime.     Marland Kitchen MILK THISTLE PO Take 1 tablet by mouth 2 (two) times daily.    . Multiple Vitamin (MULTIVITAMIN WITH MINERALS) TABS tablet Take 1 tablet by mouth 2 (two) times daily.    Marland Kitchen OVER THE COUNTER MEDICATION Take 4 sprays by mouth at bedtime. Secretropin oral spray - growth hormone    . OVER THE COUNTER MEDICATION Take 1 capsule by mouth 3 (three) times daily. Instaflex    . OVER THE COUNTER MEDICATION Take 1,000 mg by mouth daily. Turmeric with Meriva    . OVER THE COUNTER MEDICATION Take 3,000 mg by mouth daily. Omega Cure    . OVER THE COUNTER MEDICATION Take 250 mg by mouth daily. Bio Magnesium Citrate    . OVER THE COUNTER MEDICATION Take 20,000 mcg by mouth  daily. Vitamin B12-methylfolate    . OVER THE COUNTER MEDICATION Take 1 tablet by mouth daily. Pure adrenal 400    . OVER THE COUNTER MEDICATION Take 800 mcg by mouth daily. Active B Folate    . OVER THE COUNTER MEDICATION Inject 50 Units as directed daily. Semorelin    . OVER THE COUNTER MEDICATION 2 tablets daily. VitaPrime    . OVER THE COUNTER MEDICATION 1,000 mg daily. Super Bio-C buffered    . OVER THE COUNTER MEDICATION 1,000 mg daily. L'Arginine    . OVER THE COUNTER MEDICATION 2 capsules 2 (two) times daily. SLF Forte    . OVER THE COUNTER MEDICATION 1,000 mg. Myomin    . OVER THE COUNTER MEDICATION 2 capsules daily. PEENUTS    . sildenafil (VIAGRA) 100 MG tablet Take 1 tablet (100 mg total) by mouth as needed for erectile dysfunction. 10 tablet 5  . tadalafil (CIALIS) 20 MG tablet 1 tab 1 hour prior to intercourse    . tamsulosin (FLOMAX) 0.4 MG CAPS capsule Take 1 capsule (0.4 mg total) by mouth daily. For Urination. 90 capsule 3  . TAURINE PO Take 1,000 mg by mouth daily.     Marland Kitchen testosterone (ANDROGEL) 50 MG/5GM (1%) GEL APPLY 2 PACKS (10GRAMS) ONTO THE SKIN ONCE DAILY. 300 g 3  . thyroid (ARMOUR) 90 MG tablet Take 90  mg by mouth daily.     No facility-administered medications prior to visit.     Review of Systems;  Patient denies headache, fevers, malaise, unintentional weight loss, skin rash, eye pain, sinus congestion and sinus pain, sore throat, dysphagia,  hemoptysis , cough, dyspnea, wheezing, chest pain, palpitations, orthopnea, edema, abdominal pain, nausea, melena, diarrhea, constipation, flank pain, dysuria, hematuria, urinary  Frequency, nocturia, numbness, tingling, seizures,  Focal weakness, Loss of consciousness,  Tremor, insomnia, depression, anxiety, and suicidal ideation.      Objective:  BP (!) 162/76 (BP Location: Left Arm, Patient Position: Sitting, Cuff Size: Normal)   Pulse 73   Temp 98 F (36.7 C) (Oral)   Ht 5' 11.5" (1.816 m)   Wt 175 lb (79.4 kg)    SpO2 98%   BMI 24.07 kg/m   BP Readings from Last 3 Encounters:  01/06/16 (!) 162/76  12/13/15 (!) 164/80  07/19/15 130/67    Wt Readings from Last 3 Encounters:  01/06/16 175 lb (79.4 kg)  12/13/15 171 lb 8 oz (77.8 kg)  07/19/15 170 lb (77.1 kg)    General appearance: thin, alert, cooperative and appears stated age Ears: normal TM's and external ear canals both ears Throat: lips, mucosa, and tongue normal; teeth and gums normal Neck: no adenopathy, no carotid bruit, supple, symmetrical, trachea midline and thyroid not enlarged, symmetric, no tenderness/mass/nodules Back: symmetric, no curvature. ROM normal. No CVA tenderness. Lungs: clear to auscultation bilaterally Heart: regular rate and rhythm, S1, S2 normal, no murmur, click, rub or gallop Abdomen: soft, non-tender; bowel sounds normal; no masses,  no organomegaly Pulses: 2+ and symmetric Skin: Skin color, texture, turgor normal. No rashes or lesions Lymph nodes: Cervical, supraclavicular, and axillary nodes normal.  Lab Results  Component Value Date   HGBA1C 5.0 12/08/2015    Lab Results  Component Value Date   CREATININE 1.1 12/08/2015   CREATININE 1.04 06/23/2014   CREATININE 0.96 06/14/2014    Lab Results  Component Value Date   WBC 4.8 12/08/2015   HGB 14.3 12/08/2015   HCT 41 12/08/2015   PLT 106 (A) 12/08/2015   GLUCOSE 123 (H) 06/23/2014   CHOL 152 12/08/2015   TRIG 62 12/08/2015   HDL 60 12/08/2015   LDLCALC 80 12/08/2015   ALT 29 12/08/2015   AST 28 12/08/2015   NA 144 12/08/2015   K 4.4 12/08/2015   CL 101 06/23/2014   CREATININE 1.1 12/08/2015   BUN 17 12/08/2015   CO2 25 06/23/2014   TSH 5.10 12/08/2015   PSA 0.9 12/08/2015   INR 1.08 06/14/2014   HGBA1C 5.0 12/08/2015    US Abdomen Limited Ruq  Result Date: 12/20/2015 CLINICAL DATA:  History of cirrhosis of the liver EXAM: US ABDOMEN LIMITED - RIGHT UPPER QUADRANT COMPARISON:  CT abdomen pelvis of 12/10/2007 and ultrasound of  the abdomen of 11/25/2007 FINDINGS: Gallbladder: The gallbladder is visualized and no gallstones are seen. No gallbladder wall thickening is noted. Common bile duct: Diameter: The common bile duct is within normal limits measuring 2.1 mm in diameter. Liver: The liver is echogenic and inhomogeneous with nodular contour consistent with changes of cirrhosis as noted previously. No focal hepatic abnormality is seen. IMPRESSION: 1. The liver is echogenic and inhomogeneous with nodular contours consistent with cirrhosis. No focal hepatic abnormality is noted. 2. No gallstones. Electronically Signed   By: Ivar Drape M.D.   On: 12/20/2015 10:31   Assessment & Plan:   Problem List Items Addressed This Visit  Cirrhosis ([place])    Suggested by multiple imaging studies sine, 2009, presumed secondary to alcohol abuse .  Referral to Dr Gerald Dexter for evaluation and staging      Hypotestosteronism    Managed with [facility] with recent implantation of Testim . One nylon stitch removed from right  Hip today without complication.  Area has no erythema or drainage      Stress fracture of metatarsal due to multiple or repetitive stress    Given his low BMI, low testosterone lwversl,  And history of cirrhois,  Recommended DEXA scan to screen for osteoporosis        Other Visit Diagnoses   None.     I am having Mr. Tryon maintain his thyroid, OVER THE COUNTER MEDICATION, OVER THE COUNTER MEDICATION, OVER THE COUNTER MEDICATION, multivitamin with minerals, OVER THE COUNTER MEDICATION, Cholecalciferol (VITAMIN D-3 PO), TAURINE PO, OVER THE COUNTER MEDICATION, OVER THE COUNTER MEDICATION, Bitter Melon, MILK THISTLE PO, OVER THE COUNTER MEDICATION, Melatonin, tadalafil, OVER THE COUNTER MEDICATION, OVER THE COUNTER MEDICATION, OVER THE COUNTER MEDICATION, OVER THE COUNTER MEDICATION, OVER THE COUNTER MEDICATION, OVER THE COUNTER MEDICATION, OVER THE COUNTER MEDICATION, OVER THE COUNTER MEDICATION, sildenafil, testosterone,  and tamsulosin.  No orders of the defined types were placed in this encounter.   There are no discontinued medications.  Follow-up: No Follow-up on file.   [name], MD

## 2016-01-06 NOTE — Telephone Encounter (Signed)
Spoke to patient, and scheduled an appointment for suture removal with Dr. Derrel Nip @ [phone]

## 2016-01-08 DIAGNOSIS — M84376A Stress fracture, unspecified foot, initial encounter for fracture: Secondary | ICD-10-CM | POA: Insufficient documentation

## 2016-01-08 NOTE — Assessment & Plan Note (Signed)
Managed with [facility] with recent implantation of Testim . One nylon stitch removed from right  Hip today without complication.  Area has no erythema or drainage

## 2016-01-08 NOTE — Assessment & Plan Note (Signed)
Suggested by multiple imaging studies sine, 2009, presumed secondary to alcohol abuse .  Referral to Dr Gerald Dexter for evaluation and staging

## 2016-01-08 NOTE — Assessment & Plan Note (Addendum)
Given his low BMI, low testosterone level,  And history of cirrhois,  Recommended DEXA scan to screen for osteoporosis

## 2016-01-10 NOTE — Telephone Encounter (Signed)
Mailed unread message to patient.  

## 2016-01-17 ENCOUNTER — Ambulatory Visit (INDEPENDENT_AMBULATORY_CARE_PROVIDER_SITE_OTHER): Payer: PPO

## 2016-01-17 DIAGNOSIS — S92324A Nondisplaced fracture of second metatarsal bone, right foot, initial encounter for closed fracture: Secondary | ICD-10-CM | POA: Diagnosis not present

## 2016-01-17 DIAGNOSIS — Z23 Encounter for immunization: Secondary | ICD-10-CM

## 2016-01-17 NOTE — Progress Notes (Signed)
Patient came in for 2nd HepA/B injection.  Received in Left deltoid.  Patient tolerated well. thanks

## 2016-01-24 DIAGNOSIS — M25562 Pain in left knee: Secondary | ICD-10-CM | POA: Diagnosis not present

## 2016-01-24 DIAGNOSIS — M5137 Other intervertebral disc degeneration, lumbosacral region: Secondary | ICD-10-CM | POA: Diagnosis not present

## 2016-02-02 DIAGNOSIS — E291 Testicular hypofunction: Secondary | ICD-10-CM | POA: Diagnosis not present

## 2016-02-09 ENCOUNTER — Encounter: Payer: Self-pay | Admitting: Gastroenterology

## 2016-02-09 ENCOUNTER — Other Ambulatory Visit: Payer: Self-pay

## 2016-02-09 ENCOUNTER — Ambulatory Visit (INDEPENDENT_AMBULATORY_CARE_PROVIDER_SITE_OTHER): Payer: PPO | Admitting: Gastroenterology

## 2016-02-09 VITALS — BP 146/69 | HR 76 | Temp 99.0°F | Ht 71.5 in | Wt 172.0 lb

## 2016-02-09 DIAGNOSIS — K7030 Alcoholic cirrhosis of liver without ascites: Secondary | ICD-10-CM | POA: Diagnosis not present

## 2016-02-09 DIAGNOSIS — E291 Testicular hypofunction: Secondary | ICD-10-CM | POA: Diagnosis not present

## 2016-02-09 DIAGNOSIS — K7469 Other cirrhosis of liver: Secondary | ICD-10-CM | POA: Diagnosis not present

## 2016-02-09 NOTE — Progress Notes (Signed)
Primary Care Physician: [name], MD  Primary Gastroenterologist:  Dr. [name]  Chief Complaint  Patient presents with  . Cirrhosis    HPI: RAFIQ Luna is a 75 y.o. male here for follow-up of his cirrhosis. The patient reports that he had been a heavy drinker throughout his life but stopped drinking many years ago.  The patient has had chronic thrombocytopenia and imaging suggestive of cirrhosis.  The patient is getting his vaccinations for hepatitis A and hepatitis B.  The patient has no complaints at the present time and states that he gets lab work every 3 months.  The patient now comes to me for maintenance of his chronic liver disease and cirrhosis.  He denies any jaundice or abdominal swelling.  Current Outpatient Prescriptions  Medication Sig Dispense Refill  . Bitter Melon POWD Take 1 tablet by mouth daily.    . Cholecalciferol (VITAMIN D-3 PO) Take 5,000 Units by mouth daily. 5,00000 u in the morning, 2,000 u in the evening    . Melatonin 3 MG TABS Take 10 mg by mouth at bedtime.     Marland Kitchen MILK THISTLE PO Take 1 tablet by mouth 2 (two) times daily.    . Multiple Vitamin (MULTIVITAMIN WITH MINERALS) TABS tablet Take 1 tablet by mouth 2 (two) times daily.    Marland Kitchen OVER THE COUNTER MEDICATION Take 4 sprays by mouth at bedtime. Secretropin oral spray - growth hormone    . OVER THE COUNTER MEDICATION Take 1 capsule by mouth 3 (three) times daily. Instaflex    . OVER THE COUNTER MEDICATION Take 1,000 mg by mouth daily. Turmeric with Meriva    . OVER THE COUNTER MEDICATION Take 3,000 mg by mouth daily. Omega Cure    . OVER THE COUNTER MEDICATION Take 250 mg by mouth daily. Bio Magnesium Citrate    . OVER THE COUNTER MEDICATION Take 20,000 mcg by mouth daily. Vitamin B12-methylfolate    . OVER THE COUNTER MEDICATION Take 1 tablet by mouth daily. Pure adrenal 400    . OVER THE COUNTER MEDICATION Take 800 mcg by mouth daily. Active B Folate    . OVER THE COUNTER MEDICATION Inject 50  Units as directed daily. Semorelin    . OVER THE COUNTER MEDICATION 2 tablets daily. VitaPrime    . OVER THE COUNTER MEDICATION 1,000 mg daily. Super Bio-C buffered    . OVER THE COUNTER MEDICATION 1,000 mg daily. L'Arginine    . OVER THE COUNTER MEDICATION 2 capsules 2 (two) times daily. SLF Forte    . OVER THE COUNTER MEDICATION 1,000 mg. Myomin    . OVER THE COUNTER MEDICATION 2 capsules daily. PEENUTS    . sildenafil (VIAGRA) 100 MG tablet Take 1 tablet (100 mg total) by mouth as needed for erectile dysfunction. 10 tablet 5  . tamsulosin (FLOMAX) 0.4 MG CAPS capsule Take 1 capsule (0.4 mg total) by mouth daily. For Urination. 90 capsule 3  . TAURINE PO Take 1,000 mg by mouth daily.     Marland Kitchen thyroid (ARMOUR) 90 MG tablet Take 90 mg by mouth daily.    . tadalafil (CIALIS) 20 MG tablet 1 tab 1 hour prior to intercourse    . testosterone (ANDROGEL) 50 MG/5GM (1%) GEL APPLY 2 PACKS (10GRAMS) ONTO THE SKIN ONCE DAILY. (Patient not taking: Reported on 02/09/2016) 300 g 3   No current facility-administered medications for this visit.     Allergies as of 02/09/2016  . (No Known Allergies)  ROS:  General: Negative for anorexia, weight loss, fever, chills, fatigue, weakness. ENT: Negative for hoarseness, difficulty swallowing , nasal congestion. CV: Negative for chest pain, angina, palpitations, dyspnea on exertion, peripheral edema.  Respiratory: Negative for dyspnea at rest, dyspnea on exertion, cough, sputum, wheezing.  GI: See history of present illness. GU:  Negative for dysuria, hematuria, urinary incontinence, urinary frequency, nocturnal urination.  Endo: Negative for unusual weight change.    Physical Examination:   BP (!) 146/69   Pulse 76   Temp 99 F (37.2 C) (Oral)   Ht 5' 11.5" (1.816 m)   Wt 172 lb (78 kg)   BMI 23.65 kg/m   General: Well-nourished, well-developed in no acute distress.  Eyes: No icterus. Conjunctivae pink. Mouth: Oropharyngeal mucosa moist and pink ,  no lesions erythema or exudate. Lungs: Clear to auscultation bilaterally. Non-labored. Heart: Regular rate and rhythm, no murmurs rubs or gallops.  Abdomen: Bowel sounds are normal, nontender, nondistended, no hepatosplenomegaly or masses, no abdominal bruits or hernia , no rebound or guarding.   Extremities: No lower extremity edema. No clubbing or deformities. Neuro: Alert and oriented x 3.  Grossly intact. Skin: Warm and dry, no jaundice.   Psych: Alert and cooperative, normal mood and affect.  Labs:    Imaging Studies: No results found.  Assessment and Plan:   Tony Luna is a 75 y.o. y/o male who comes in today with a history of alcohol abuse but he stopped drinking many years ago. 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  The patient has had recent blood work with normal liver enzymes but has not had an alpha-fetoprotein sent off.  The patient's ultrasound also did not show any liver lesions.  And has been told to avoid iron.   He has also been told to avoid excessive NSAIDs due to this inciting hepatorenal syndrome.  The patient has been told that he can take Tylenol but no more than what is prescribed on the bottle. He will need a repeat ultrasound in January..  The patient has been explained the plan and agrees with it.   Note: This dictation was prepared with Dragon dictation along with smaller phrase technology. Any transcriptional errors that result from this process are unintentional.

## 2016-02-10 LAB — AFP TUMOR MARKER: AFP-Tumor Marker: 1.8 ng/mL (ref 0.0–8.3)

## 2016-02-14 ENCOUNTER — Telehealth: Payer: Self-pay

## 2016-02-14 NOTE — Telephone Encounter (Signed)
Pt notified of lab result. 

## 2016-02-14 NOTE — Telephone Encounter (Signed)
-----   Message from [name], MD sent at 02/13/2016  1:29 PM EDT ----- Let the patient know that his tumor marker was negative for liver cancer

## 2016-02-29 IMAGING — CR DG KNEE 1-2V PORT*L*
2 series · 2 of 2 positions shown · non-contrast
Comparison: AP and lateral views of the left knee August 14, 2010

CLINICAL DATA: Status post left total knee joint replacement.

EXAM:
PORTABLE LEFT KNEE - 1-2 VIEW

[AP]
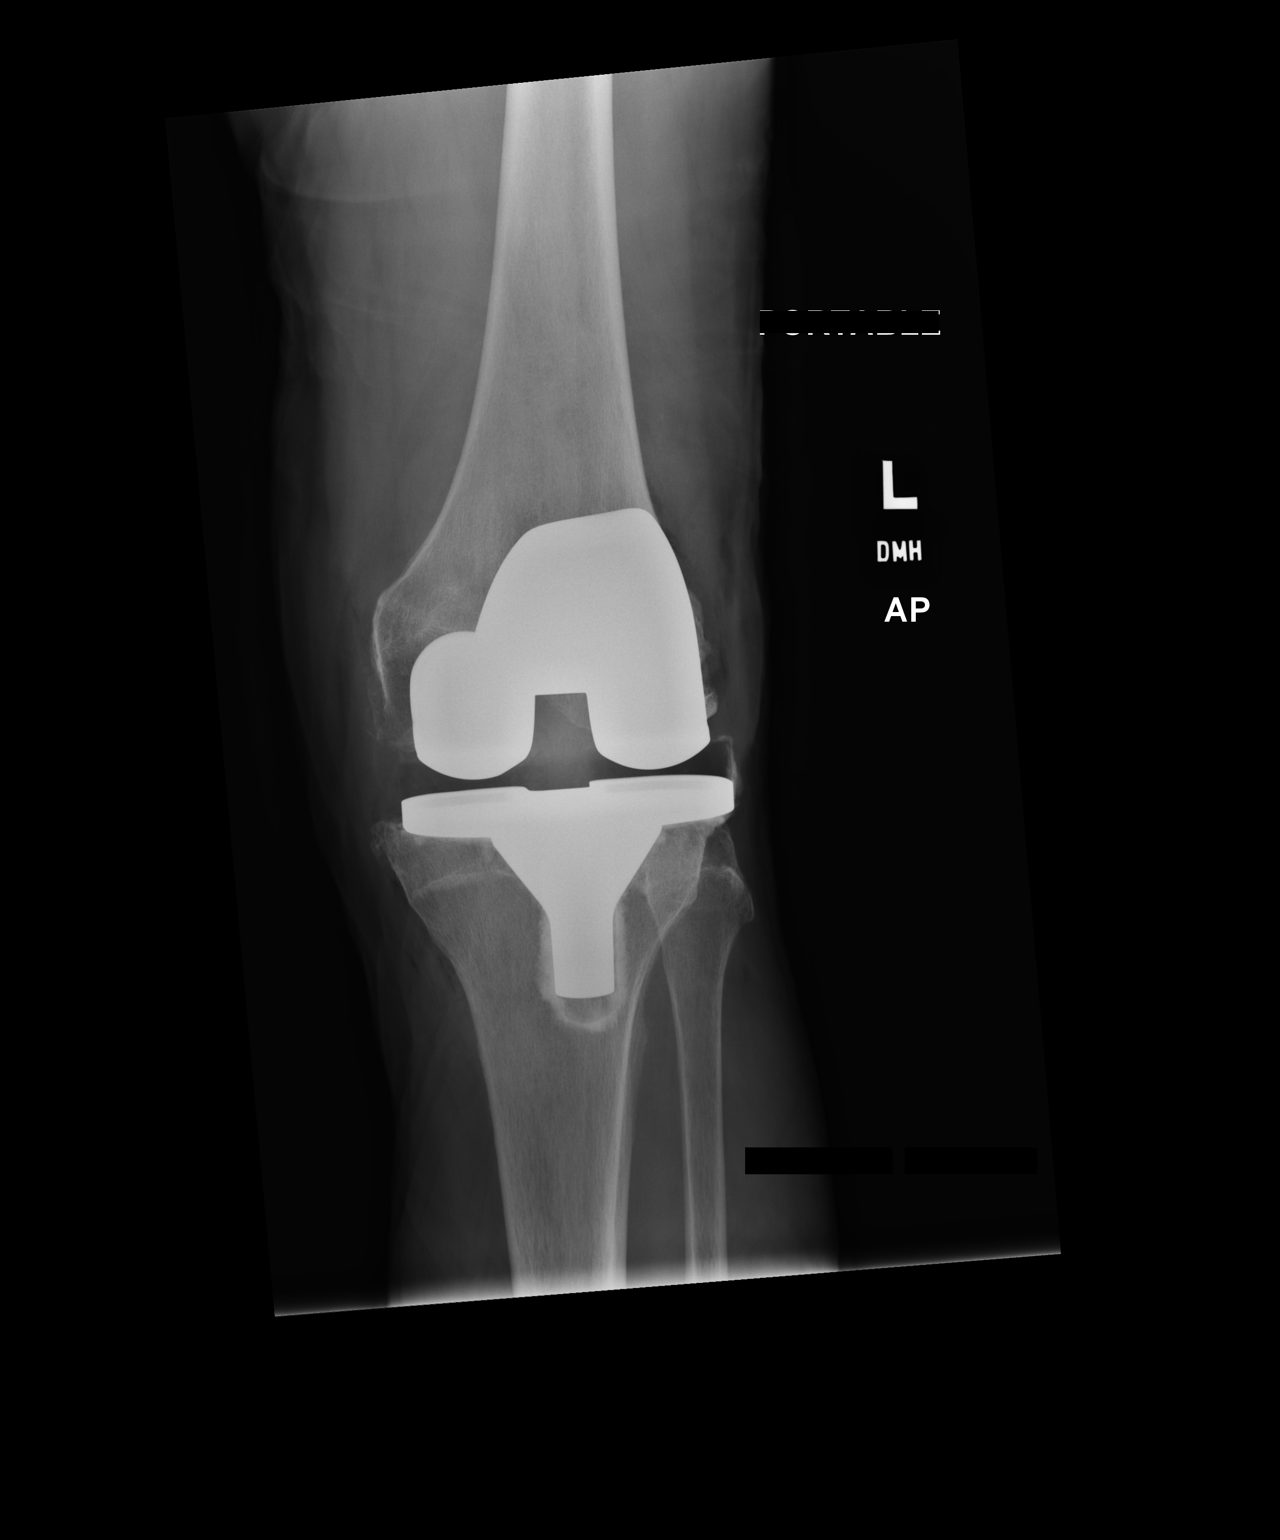

[xtable lateral]
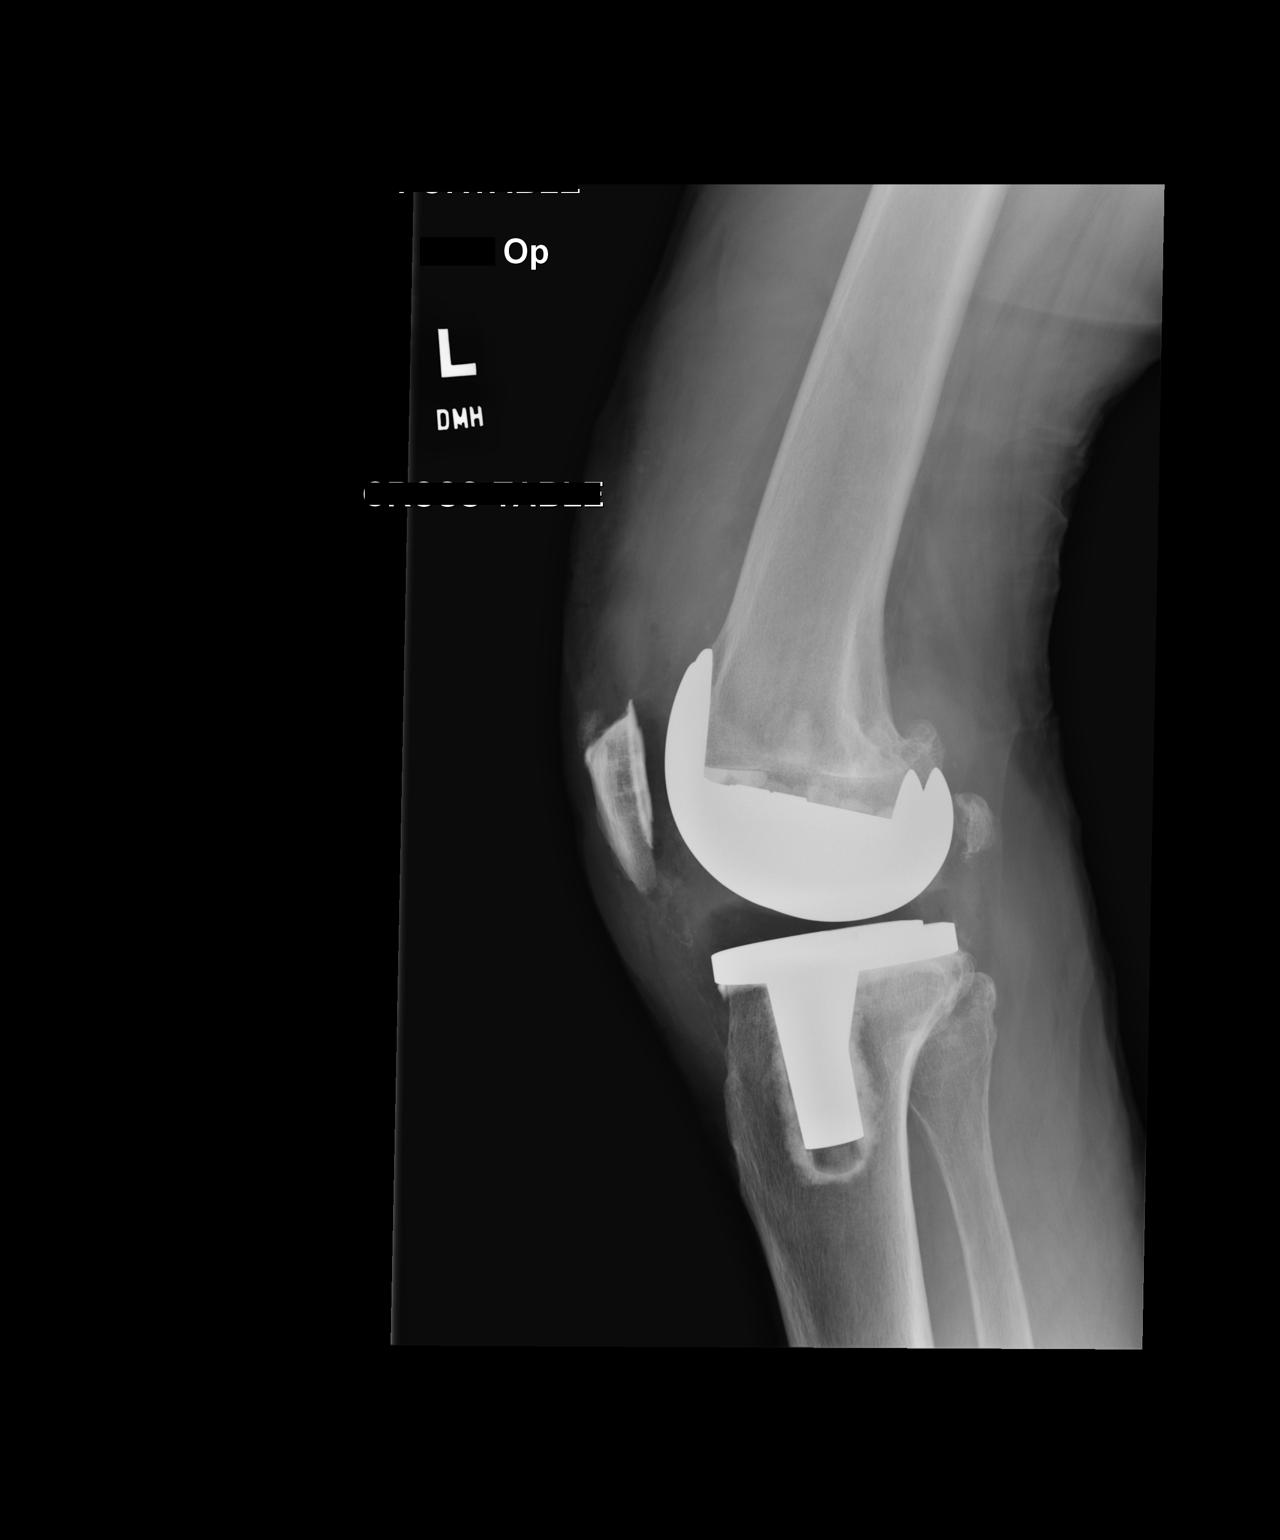

[2 of 2 positions shown; findings below may reference images not displayed]

FINDINGS: The patient has undergone left total knee joint prosthesis
placement. Radiographic positioning of the prosthetic components is
good. There is a small suprapatellar effusion. No skin staples or
drain lines are visible.
IMPRESSION: The patient has undergone left total knee joint replacement without
evidence of immediate postprocedure complication.

## 2016-03-01 ENCOUNTER — Encounter: Payer: Self-pay | Admitting: Internal Medicine

## 2016-03-06 DIAGNOSIS — H2513 Age-related nuclear cataract, bilateral: Secondary | ICD-10-CM | POA: Diagnosis not present

## 2016-03-06 DIAGNOSIS — S92324D Nondisplaced fracture of second metatarsal bone, right foot, subsequent encounter for fracture with routine healing: Secondary | ICD-10-CM | POA: Diagnosis not present

## 2016-03-21 DIAGNOSIS — D2272 Melanocytic nevi of left lower limb, including hip: Secondary | ICD-10-CM | POA: Diagnosis not present

## 2016-03-21 DIAGNOSIS — D2261 Melanocytic nevi of right upper limb, including shoulder: Secondary | ICD-10-CM | POA: Diagnosis not present

## 2016-03-21 DIAGNOSIS — Z85828 Personal history of other malignant neoplasm of skin: Secondary | ICD-10-CM | POA: Diagnosis not present

## 2016-03-22 DIAGNOSIS — E291 Testicular hypofunction: Secondary | ICD-10-CM | POA: Diagnosis not present

## 2016-03-22 DIAGNOSIS — M5137 Other intervertebral disc degeneration, lumbosacral region: Secondary | ICD-10-CM | POA: Diagnosis not present

## 2016-03-22 DIAGNOSIS — M25562 Pain in left knee: Secondary | ICD-10-CM | POA: Diagnosis not present

## 2016-03-26 DIAGNOSIS — F17220 Nicotine dependence, chewing tobacco, uncomplicated: Secondary | ICD-10-CM | POA: Diagnosis not present

## 2016-03-26 DIAGNOSIS — Z79899 Other long term (current) drug therapy: Secondary | ICD-10-CM | POA: Diagnosis not present

## 2016-03-26 DIAGNOSIS — E291 Testicular hypofunction: Secondary | ICD-10-CM | POA: Diagnosis not present

## 2016-03-26 DIAGNOSIS — N138 Other obstructive and reflux uropathy: Secondary | ICD-10-CM | POA: Diagnosis not present

## 2016-03-26 DIAGNOSIS — M1990 Unspecified osteoarthritis, unspecified site: Secondary | ICD-10-CM | POA: Diagnosis not present

## 2016-03-26 DIAGNOSIS — N401 Enlarged prostate with lower urinary tract symptoms: Secondary | ICD-10-CM | POA: Diagnosis not present

## 2016-03-26 DIAGNOSIS — Z809 Family history of malignant neoplasm, unspecified: Secondary | ICD-10-CM | POA: Diagnosis not present

## 2016-03-26 DIAGNOSIS — Z8042 Family history of malignant neoplasm of prostate: Secondary | ICD-10-CM | POA: Diagnosis not present

## 2016-03-26 DIAGNOSIS — Z833 Family history of diabetes mellitus: Secondary | ICD-10-CM | POA: Diagnosis not present

## 2016-03-26 DIAGNOSIS — N529 Male erectile dysfunction, unspecified: Secondary | ICD-10-CM | POA: Diagnosis not present

## 2016-03-28 ENCOUNTER — Ambulatory Visit
Admission: RE | Admit: 2016-03-28 | Discharge: 2016-03-28 | Disposition: A | Discharge status: Home / Self Care | Payer: PPO | Source: Ambulatory Visit | Attending: Internal Medicine | Admitting: Internal Medicine

## 2016-03-28 DIAGNOSIS — E349 Endocrine disorder, unspecified: Secondary | ICD-10-CM | POA: Insufficient documentation

## 2016-03-28 DIAGNOSIS — K7460 Unspecified cirrhosis of liver: Secondary | ICD-10-CM

## 2016-03-28 DIAGNOSIS — M810 Age-related osteoporosis without current pathological fracture: Secondary | ICD-10-CM | POA: Insufficient documentation

## 2016-03-28 DIAGNOSIS — M84377D Stress fracture, right toe(s), subsequent encounter for fracture with routine healing: Secondary | ICD-10-CM | POA: Diagnosis not present

## 2016-03-28 DIAGNOSIS — M84374D Stress fracture, right foot, subsequent encounter for fracture with routine healing: Secondary | ICD-10-CM

## 2016-03-29 ENCOUNTER — Encounter: Payer: Self-pay | Admitting: Internal Medicine

## 2016-03-29 DIAGNOSIS — M810 Age-related osteoporosis without current pathological fracture: Secondary | ICD-10-CM | POA: Insufficient documentation

## 2016-04-03 DIAGNOSIS — E291 Testicular hypofunction: Secondary | ICD-10-CM | POA: Diagnosis not present

## 2016-04-09 LAB — HEMOGLOBIN A1C: Hemoglobin A1C: 5.1

## 2016-04-09 LAB — PSA: PSA: 0.8

## 2016-04-12 NOTE — Telephone Encounter (Signed)
Mailed unread message to patient . Thanks

## 2016-04-16 ENCOUNTER — Telehealth: Payer: Self-pay | Admitting: Internal Medicine

## 2016-04-16 NOTE — Telephone Encounter (Signed)
In results folder. 

## 2016-04-16 NOTE — Telephone Encounter (Signed)
Pt dropped off lab results.. Placed in Dr. Demetrios Isaacs folder will be delivered with mail.Marland Kitchen

## 2016-04-18 ENCOUNTER — Ambulatory Visit (INDEPENDENT_AMBULATORY_CARE_PROVIDER_SITE_OTHER): Payer: PPO | Admitting: Internal Medicine

## 2016-04-18 ENCOUNTER — Encounter: Payer: Self-pay | Admitting: Internal Medicine

## 2016-04-18 DIAGNOSIS — N401 Enlarged prostate with lower urinary tract symptoms: Secondary | ICD-10-CM

## 2016-04-18 DIAGNOSIS — D696 Thrombocytopenia, unspecified: Secondary | ICD-10-CM | POA: Diagnosis not present

## 2016-04-18 DIAGNOSIS — R030 Elevated blood-pressure reading, without diagnosis of hypertension: Secondary | ICD-10-CM

## 2016-04-18 DIAGNOSIS — K7460 Unspecified cirrhosis of liver: Secondary | ICD-10-CM | POA: Diagnosis not present

## 2016-04-18 DIAGNOSIS — M810 Age-related osteoporosis without current pathological fracture: Secondary | ICD-10-CM | POA: Diagnosis not present

## 2016-04-18 DIAGNOSIS — N138 Other obstructive and reflux uropathy: Secondary | ICD-10-CM

## 2016-04-18 MED ORDER — ALENDRONATE SODIUM 70 MG PO TABS: 70.0000 mg | ORAL_TABLET | ORAL | 11 refills | Status: DC

## 2016-04-18 NOTE — Progress Notes (Signed)
Patient ID: Tony Luna, male    DOB: 08-07-1940  Age: 75 y.o. MRN: [id number]  The patient is here for annual Medicare wellness examination and management of other chronic and acute problems.   The risk factors are reflected in the social history.   The roster of all physicians providing medical care to patient - is listed in the Snapshot section of the chart.  Activities of daily living:  The patient is 100% independent in all ADLs: dressing, toileting, feeding as well as independent mobility  Home safety : The patient has smoke detectors in the home. They wear seatbelts.  There are no firearms at home. There is no violence in the home.   There is no risks for hepatitis, STDs or HIV. There is no   history of blood transfusion. They have no travel history to infectious disease endemic areas of the world.  The patient has seen his dentist in the last six month. He has seen their eye doctor in the last year. He has hearing difficulty  And he wears hearing aides   They do not  have excessive sun exposure. Discussed the need for sun protection: hats, long sleeves and use of sunscreen if there is significant sun exposure.   Diet: the importance of a healthy diet is discussed. They do have a healthy diet.  The benefits of regular aerobic exercise were discussed. He exercises 5 times per week including weight lifting and cardio.    Depression screen: there are no signs or vegative symptoms of depression- irritability, change in appetite, anhedonia, sadness/tearfullness.  Cognitive assessment: the patient manages all their financial and personal affairs and is actively engaged. They could relate day,date,year and events; recalled 2/3 objects at 3 minutes; performed clock-face test normally.  The following portions of the patient's history were reviewed and updated as appropriate: allergies, current medications, past family history, past medical history,  past surgical history, past social history   and problem list.  Visual acuity was not assessed per patient preference since she has regular follow up with her ophthalmologist. Hearing and body mass index were assessed and reviewed.   During the course of the visit the patient was educated and counseled about appropriate screening and preventive services including : fall prevention , diabetes screening, nutrition counseling, colorectal cancer screening, and recommended immunizations.    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   1) Persistent swelling left wrist volar surface  Thinks it may have been a spider bite because it stung at the time her first noticed it.  Initially turned red and swelled,  but never  saw the insect.  Was working outside whe it happened.  The swelling has never resolved, but it has not increased in size. Did not mention it to heis dermatologist during recent exam.    Hematuria  noted on UA by [facility] on Nov 3 UA. 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  Chronic,  Has been on some form of therapy for around 5 years.  Had 2nd  testosterone implantation done,  Frequency is Every 4 months  Starts to feel tired a few weeks before next one is due and is requesting Androgel for use  during the last month of each cycle. Marland Kitchen  Recent test level was high at 952, which was checked less than a week after his testosterone pellets were immplanted.   Done by [facility] in [place]. [place]? No records available .    4) Osteoporosis.: by recent DEXA .   Taking 5000 Ius D3 daily and calcium supplements.  Concerned that it was  Due to his use of soy isolates which he  read limit absorption of Vitamin D.      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   He has a past surgical history that includes Knee surgery; Rotator cuff repair; Tonsillectomy; Spine surgery; Hernia repair (Left); Cautery of turbinates; blepharaplasty; Total knee arthroplasty (Left, 06/22/2014); and Colonoscopy with propofol (N/A, 07/19/2015).   His family history includes Alcohol abuse in his mother; Diabetes in his mother; Mental retardation in his mother.He reports that he quit smoking about 32 years ago. His smoking use included Cigarettes. He has a 15.00 pack-year smoking history. His smokeless tobacco use includes Chew. He reports that he does not drink alcohol or use drugs.  Outpatient Medications Prior to Visit  Medication Sig Dispense Refill  . Bitter Melon POWD Take 1 tablet by mouth daily.    . Cholecalciferol (VITAMIN D-3 PO) Take 5,000 Units by mouth daily. 5,00000 u in the morning, 2,000 u in the evening    . Melatonin 3 MG TABS Take 10 mg by mouth at bedtime.     Marland Kitchen MILK THISTLE PO Take 1 tablet by mouth 2 (two) times daily.    . Multiple Vitamin (MULTIVITAMIN WITH MINERALS) TABS tablet Take 1 tablet by mouth 2 (two) times daily.    Marland Kitchen OVER THE COUNTER MEDICATION Take 4 sprays by mouth at bedtime. Secretropin oral spray - growth hormone    . OVER THE COUNTER MEDICATION Take 1 capsule by mouth 3 (three) times daily. Instaflex    . OVER THE COUNTER MEDICATION Take 1,000 mg by mouth daily. Turmeric with Meriva    . OVER THE COUNTER MEDICATION Take 3,000 mg by mouth daily. Omega Cure    . OVER THE COUNTER MEDICATION Take 250 mg by mouth daily. Bio Magnesium Citrate    . OVER THE COUNTER MEDICATION Take 20,000  mcg by mouth daily. Vitamin B12-methylfolate    . OVER THE COUNTER MEDICATION Take 1 tablet by mouth daily. Pure adrenal 400    . OVER THE COUNTER MEDICATION Take 800 mcg by mouth daily. Active B Folate    . OVER THE COUNTER MEDICATION Inject 50 Units as directed daily. Semorelin    . OVER THE COUNTER MEDICATION 2 tablets daily. VitaPrime    . OVER THE COUNTER MEDICATION 1,000 mg daily. Super Bio-C buffered    . OVER THE COUNTER MEDICATION 1,000 mg daily. L'Arginine    . OVER THE COUNTER MEDICATION 2 capsules 2 (two) times daily. SLF Forte    . OVER THE COUNTER MEDICATION 1,000 mg. Myomin    . OVER THE COUNTER MEDICATION 2 capsules daily. PEENUTS    . sildenafil (VIAGRA) 100 MG tablet Take 1 tablet (100 mg total) by mouth as needed for erectile dysfunction. 10 tablet 5  . tadalafil (CIALIS) 20 MG tablet 1 tab 1  hour prior to intercourse    . tamsulosin (FLOMAX) 0.4 MG CAPS capsule Take 1 capsule (0.4 mg total) by mouth daily. For Urination. 90 capsule 3  . TAURINE PO Take 1,000 mg by mouth daily.     Marland Kitchen testosterone (ANDROGEL) 50 MG/5GM (1%) GEL APPLY 2 PACKS (10GRAMS) ONTO THE SKIN ONCE DAILY. 300 g 3  . thyroid (ARMOUR) 90 MG tablet Take 90 mg by mouth daily.     No facility-administered medications prior to visit.     Review of Systems   Patient denies headache, fevers, malaise, unintentional weight loss, skin rash, eye pain, sinus congestion and sinus pain, sore throat, dysphagia,  hemoptysis , cough, dyspnea, wheezing, chest pain, palpitations, orthopnea, edema, abdominal pain, nausea, melena, diarrhea, constipation, flank pain, dysuria, hematuria, urinary  Frequency, nocturia, numbness, tingling, seizures,  Focal weakness, Loss of consciousness,  Tremor, insomnia, depression, anxiety, and suicidal ideation.      Objective:  BP 140/68   Pulse 70   Temp 98 F (36.7 C) (Oral)   Resp 12   Ht 6' (1.829 m)   Wt 178 lb 8 oz (81 kg)   SpO2 96%   BMI 24.21 kg/m   Physical Exam    General appearance: alert, cooperative and appears stated age Ears: normal TM's and external ear canals both ears Throat: lips, mucosa, and tongue normal; teeth and gums normal Neck: no adenopathy, no carotid bruit, supple, symmetrical, trachea midline and thyroid not enlarged, symmetric, no tenderness/mass/nodules Back: symmetric, no curvature. ROM normal. No CVA tenderness. Lungs: clear to auscultation bilaterally Heart: regular rate and rhythm, S1, S2 normal, no murmur, click, rub or gallop Abdomen: soft, non-tender; bowel sounds normal; no masses,  no organomegaly Pulses: 2+ and symmetric Skin: Skin color, texture, turgor normal. No rashes or lesions Lymph nodes: Cervical, supraclavicular, and axillary nodes normal.    Assessment & Plan:   Problem List Items Addressed This Visit    Cirrhosis ([place])    Secondary to history of alcohol abuse, suggested by prior imaging studies, first noted around 2012  Has been alcohol abstinent for years.  Was referred to GI [facility] who has recommended serial screens for hepatocellular carcinoma with AFP and fibroscan but did not order them.  Therefore, I will manage surveillance.  Next imaging will be due  in March       Relevant Orders   US LIVER ELASTOGRAPHY   AFP tumor marker   Lipid panel   Comp Met (CMET)   Thrombocytopenia (HCC)      Lab Results  Component Value Date   WBC 4.8 12/08/2015   HGB 14.3 12/08/2015   HCT 41 12/08/2015   MCV 85.9 06/24/2014   PLT 106 (A) 12/08/2015         Benign prostatic hyperplasia with urinary obstruction    Managed with Flomax.  Cialis 5 mg daily started as well.  He can try stopping the Flomax after 4 weeks of daily Cialis, but would prefer to continue both. 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 Renal function is normal.  Lab Results  Component  Value Date   CREATININE 1.1 12/08/2015   Lab Results  Component Value Date   NA 144 12/08/2015   K 4.4 12/08/2015   CL 101 06/23/2014   CO2 25 06/23/2014  Relevant Medications   tadalafil (CIALIS) 5 MG tablet   Osteoporosis    Secondary to low BMI and low testosterone.  Has been on testosterone supplementation for over 5 years.  Alendronate weekly therapy discussed and initiated.  Vit D level is 45  As of Apr 09 2016       Relevant Medications   alendronate (FOSAMAX) 70 MG tablet    A total of 40 minutes was spent with patient more than half of which was spent in counseling patient on the above mentioned issues , reviewing and explaining recent labs and imaging studies done, and coordination of care.   I am having Mr. Dann start on alendronate. I am also having him maintain his thyroid, OVER THE COUNTER MEDICATION, OVER THE COUNTER MEDICATION, OVER THE COUNTER MEDICATION, multivitamin with minerals, OVER THE COUNTER MEDICATION, Cholecalciferol (VITAMIN D-3 PO), TAURINE PO, OVER THE COUNTER MEDICATION, OVER THE COUNTER MEDICATION, Bitter Melon, MILK THISTLE PO, OVER THE COUNTER MEDICATION, Melatonin, tadalafil, OVER THE COUNTER MEDICATION, OVER THE COUNTER MEDICATION, OVER THE COUNTER MEDICATION, OVER THE COUNTER MEDICATION, OVER THE COUNTER MEDICATION, OVER THE COUNTER MEDICATION, OVER THE COUNTER MEDICATION, OVER THE COUNTER MEDICATION, sildenafil, testosterone, tamsulosin, and tadalafil.  Meds ordered this encounter  Medications  . tadalafil (CIALIS) 5 MG tablet    Sig: Take 5 mg by mouth.  Marland Kitchen alendronate (FOSAMAX) 70 MG tablet    Sig: Take 1 tablet (70 mg total) by mouth every 7 (seven) days. Take with a full glass of water on an empty stomach.    Dispense:  4 tablet    Refill:  11    There are no discontinued medications.  Follow-up: No Follow-up on file.   [name], MD

## 2016-04-18 NOTE — Patient Instructions (Signed)
I recommend repeating your testosterone level when your energy level starts to dip .  I need fasting lipids  CMET and CBC w diff every 3-4 months while taking testosterone  Alendronate,  Weekly  For treatment of osteoporosis Alendronate effervescent oral tablets What is this medicine? ALENDRONATE (a LEN droe nate) slows calcium loss from bones. It helps to make healthy bone and to slow bone loss in people with osteoporosis. COMMON BRAND NAME(S): Binosto What should I tell my health care provider before I take this medicine? They need to know if you have any of these conditions: -esophagus, stomach, or intestine problems, like acid-reflux or GERD -dental disease -heart disease -high blood pressure -kidney disease -low blood calcium -low vitamin D -problems swallowing -problems sitting or standing for 30 minutes -an unusual or allergic reaction to alendronate, other medicines, foods, dyes, or preservatives -pregnant or trying to get pregnant -breast-feeding How should I use this medicine? You must take this medicine exactly as directed or you will lower the amount of medicine you absorb into your body or you may cause yourself harm. Take your dose by mouth first thing in the morning, after you are up for the day. Do not eat or drink anything before you take this medicine. Dissolve your medicine in a glass (4 fluid ounces) of plain, room temperature water. Do not take this tablet with any other drink. Wait at least 5 minutes after the medicine has dissolved and then stir for 10 seconds and drink. After taking this medicine, do not eat breakfast, drink, or take any medicines or vitamins for at least 30 minutes. Stand or sit up for at least 30 minutes after you take this medicine; do not lie down. Take this medicine on the same day every week. Do not take your medicine more often than directed. Talk to your pediatrician regarding the use of this medicine in children. Special care may be  needed. What if I miss a dose? If you miss a dose, take the dose on the morning after you remember. Then take your next dose on your regular day of the week. Never take 2 tablets on the same day. Do not take double or extra doses. What may interact with this medicine? -aluminum hydroxide -antacids -aspirin -calcium supplements -drugs for inflammation like ibuprofen, naproxen, and others -iron supplements -magnesium supplements -vitamins with minerals What should I watch for while using this medicine? Visit your doctor or health care professional for regular checks ups. It may be some time before you see benefit from this medicine. Do not stop taking your medication except on your doctor's advice. Your doctor or health care professional may order blood tests and other tests to see how you are doing. You should make sure you get enough calcium and vitamin D while you are taking this medicine, unless your doctor tells you not to. Discuss the foods you eat and the vitamins you take with your health care professional. Some people who take this medicine have severe bone, joint, and/or muscle pain. This medicine may also increase your risk for a broken thigh bone. Tell your doctor right away if you have pain in your upper leg or groin. Tell your doctor if you have any pain that does not go away or that gets worse. This medicine can make you more sensitive to the sun. If you get a rash while taking this medicine, sunlight may cause the rash to get worse. Keep out of the sun. If you cannot avoid being in  the sun, wear protective clothing and use sunscreen. Do not use sun lamps or tanning beds/booths. What side effects may I notice from receiving this medicine? Side effects that you should report to your doctor or health care professional as soon as possible: -allergic reactions like skin rash, itching or hives, swelling of the face, lips, or tongue -black or tarry stools -bone, muscle or joint  pain -changes in vision -chest pain -heartburn or stomach pain -jaw pain, especially after dental work -pain or trouble when swallowing -redness, blistering, peeling or loosening of the skin, including inside the mouth Side effects that usually do not require medical attention (report to your doctor or health care professional if they continue or are bothersome): -changes in taste -diarrhea or constipation -eye pain or itching -headache -nausea or vomiting -stomach gas or fullness Where should I keep my medicine? Keep out of the reach of children. Store between 20 and 25 degrees C (68 and 77 degrees F). Keep this medicine in the original container. Throw away any unused medicine after the expiration date.  2017 Elsevier/Gold Standard (2015-06-16 11:56:07)

## 2016-04-18 NOTE — Progress Notes (Signed)
Pre-visit discussion using our clinic review tool. No additional management support is needed unless otherwise documented below in the visit note.  

## 2016-04-21 NOTE — Assessment & Plan Note (Signed)
   Lab Results  Component Value Date   WBC 4.8 12/08/2015   HGB 14.3 12/08/2015   HCT 41 12/08/2015   MCV 85.9 06/24/2014   PLT 106 (A) 12/08/2015

## 2016-04-21 NOTE — Assessment & Plan Note (Addendum)
Secondary to history of alcohol abuse, suggested by prior imaging studies, first noted around 2012  Has been alcohol abstinent for years.  Was referred to GI [facility] who has recommended serial screens for hepatocellular carcinoma with AFP and fibroscan but did not order them.  Therefore, I will manage surveillance.  Next imaging will be due  in March

## 2016-04-21 NOTE — Assessment & Plan Note (Signed)
Secondary to low BMI and low testosterone.  Has been on testosterone supplementation for over 5 years.  Alendronate weekly therapy discussed and initiated.  Vit D level is 45  As of Apr 09 2016

## 2016-04-21 NOTE — Assessment & Plan Note (Signed)
Home readings remain < 130/80. Renal function is normal.  Lab Results  Component Value Date   CREATININE 1.1 12/08/2015   Lab Results  Component Value Date   NA 144 12/08/2015   K 4.4 12/08/2015   CL 101 06/23/2014   CO2 25 06/23/2014

## 2016-04-21 NOTE — Assessment & Plan Note (Addendum)
Managed with Flomax.  Cialis 5 mg daily started as well.  He can try stopping the Flomax after 4 weeks of daily Cialis, but would prefer to continue both. Lab Results  Component Value Date   PSA 0.8 04/09/2016   PSA 0.9 12/08/2015

## 2016-04-24 DIAGNOSIS — M5137 Other intervertebral disc degeneration, lumbosacral region: Secondary | ICD-10-CM | POA: Diagnosis not present

## 2016-04-24 DIAGNOSIS — M25562 Pain in left knee: Secondary | ICD-10-CM | POA: Diagnosis not present

## 2016-04-25 ENCOUNTER — Encounter: Payer: Self-pay | Admitting: Internal Medicine

## 2016-04-26 LAB — HEPATIC FUNCTION PANEL
ALT: 30 U/L (ref 10–40)
AST: 33 U/L (ref 14–40)
Alkaline Phosphatase: 109 U/L (ref 25–125)
Bilirubin, Total: 0.9 mg/dL

## 2016-04-26 LAB — LIPID PANEL
Cholesterol: 169 mg/dL (ref 0–200)
HDL: 67 mg/dL (ref 35–70)
LDL Cholesterol: 90 mg/dL
Triglycerides: 59 mg/dL (ref 40–160)

## 2016-04-26 LAB — BASIC METABOLIC PANEL
BUN: 23 mg/dL — AB (ref 4–21)
Creatinine: 1.1 mg/dL (ref 0.6–1.3)
Glucose: 101 mg/dL
Potassium: 4.6 mmol/L (ref 3.4–5.3)
Sodium: 143 mmol/L (ref 137–147)

## 2016-04-26 LAB — CBC AND DIFFERENTIAL
HCT: 46 % (ref 41–53)
Hemoglobin: 16 g/dL (ref 13.5–17.5)
Neutrophils Absolute: 3 /uL
Platelets: 131 10*3/uL — AB (ref 150–399)
WBC: 4.8 10^3/mL

## 2016-04-26 LAB — HEMOGLOBIN A1C: Hemoglobin A1C: 5

## 2016-04-26 LAB — TSH: TSH: 1.44 u[IU]/mL (ref 0.41–5.90)

## 2016-05-10 ENCOUNTER — Telehealth: Payer: Self-pay | Admitting: Internal Medicine

## 2016-05-10 NOTE — Telephone Encounter (Signed)
Labs  Received, reviewed and patient contacted.  Placed in red folder for abstraction.

## 2016-05-11 NOTE — Telephone Encounter (Signed)
abstracted 

## 2016-05-15 ENCOUNTER — Ambulatory Visit: Payer: Self-pay

## 2016-05-16 DIAGNOSIS — G479 Sleep disorder, unspecified: Secondary | ICD-10-CM | POA: Diagnosis not present

## 2016-05-16 DIAGNOSIS — R0683 Snoring: Secondary | ICD-10-CM | POA: Diagnosis not present

## 2016-05-29 ENCOUNTER — Ambulatory Visit (INDEPENDENT_AMBULATORY_CARE_PROVIDER_SITE_OTHER): Payer: PPO

## 2016-05-29 VITALS — BP 150/68 | HR 75 | Temp 97.8°F | Resp 14 | Ht 71.5 in | Wt 178.1 lb

## 2016-05-29 DIAGNOSIS — Z0000 Encounter for general adult medical examination without abnormal findings: Secondary | ICD-10-CM

## 2016-05-29 NOTE — Patient Instructions (Addendum)
  Tony Luna , Thank you for taking time to come for your Medicare Wellness Visit. I appreciate your ongoing commitment to your health goals. Please review the following plan we discussed and let me know if I can assist you in the future.   Follow up with Dr. Derrel Nip as needed.  These are the goals we discussed: Goals    . Exercise 150 minutes per week (moderate activity)    . Increase water intake          Stay hydrated and drink plenty of water/fluids       This is a list of the screening recommended for you and due dates:  Health Maintenance  Topic Date Due  . Tetanus Vaccine  02/14/2016  . Colon Cancer Screening  12/07/2020  . Flu Shot  Completed  . Shingles Vaccine  Completed  . Pneumonia vaccines  Completed

## 2016-05-29 NOTE — Progress Notes (Signed)
Subjective:   Tony Luna is a 76 y.o. male who presents for an Initial Medicare Annual Wellness Visit.  Review of Systems  No ROS.  Medicare Wellness Visit.  Cardiac Risk Factors include: advanced age (>70men, >59 women);male gender    Objective:    Today's Vitals   05/29/16 1127  BP: (!) 150/68  Pulse: 75  Resp: 14  Temp: 97.8 F (36.6 C)  TempSrc: Oral  SpO2: 96%  Weight: 178 lb 1.9 oz (80.8 kg)  Height: 5' 11.5" (1.816 m)   Body mass index is 24.5 kg/m.  Current Medications (verified) Outpatient Encounter Prescriptions as of 05/29/2016  Medication Sig  . alendronate (FOSAMAX) 70 MG tablet Take 1 tablet (70 mg total) by mouth every 7 (seven) days. Take with a full glass of water on an empty stomach.  Theron Arista Melon POWD Take 1 tablet by mouth daily.  . Cholecalciferol (VITAMIN D-3 PO) Take 5,000 Units by mouth daily. 5,00000 u in the morning, 2,000 u in the evening  . Melatonin 3 MG TABS Take 10 mg by mouth at bedtime.   Marland Kitchen MILK THISTLE PO Take 1 tablet by mouth 2 (two) times daily.  . Multiple Vitamin (MULTIVITAMIN WITH MINERALS) TABS tablet Take 1 tablet by mouth 2 (two) times daily.  Marland Kitchen OVER THE COUNTER MEDICATION Take 4 sprays by mouth at bedtime. Secretropin oral spray - growth hormone  . OVER THE COUNTER MEDICATION Take 1 capsule by mouth 3 (three) times daily. Instaflex  . OVER THE COUNTER MEDICATION Take 1,000 mg by mouth daily. Turmeric with Meriva  . OVER THE COUNTER MEDICATION Take 3,000 mg by mouth daily. Omega Cure  . OVER THE COUNTER MEDICATION Take 250 mg by mouth daily. Bio Magnesium Citrate  . OVER THE COUNTER MEDICATION Take 20,000 mcg by mouth daily. Vitamin B12-methylfolate  . OVER THE COUNTER MEDICATION Take 1 tablet by mouth daily. Pure adrenal 400  . OVER THE COUNTER MEDICATION Take 800 mcg by mouth daily. Active B Folate  . OVER THE COUNTER MEDICATION Inject 50 Units as directed daily. Semorelin  . OVER THE COUNTER MEDICATION 2 tablets daily.  VitaPrime  . OVER THE COUNTER MEDICATION 1,000 mg daily. Super Bio-C buffered  . OVER THE COUNTER MEDICATION 1,000 mg daily. L'Arginine  . OVER THE COUNTER MEDICATION 2 capsules 2 (two) times daily. SLF Forte  . OVER THE COUNTER MEDICATION 1,000 mg. Myomin  . OVER THE COUNTER MEDICATION 2 capsules daily. PEENUTS  . sildenafil (VIAGRA) 100 MG tablet Take 1 tablet (100 mg total) by mouth as needed for erectile dysfunction.  . tadalafil (CIALIS) 20 MG tablet 1 tab 1 hour prior to intercourse  . tadalafil (CIALIS) 5 MG tablet Take 5 mg by mouth.  . tamsulosin (FLOMAX) 0.4 MG CAPS capsule Take 1 capsule (0.4 mg total) by mouth daily. For Urination.  Marland Kitchen TAURINE PO Take 1,000 mg by mouth daily.   Marland Kitchen testosterone (ANDROGEL) 50 MG/5GM (1%) GEL APPLY 2 PACKS (10GRAMS) ONTO THE SKIN ONCE DAILY.  Marland Kitchen thyroid (ARMOUR) 90 MG tablet Take 90 mg by mouth daily.   No facility-administered encounter medications on file as of 05/29/2016.     Allergies (verified) Patient has no known allergies.   History: Past Medical History:  Diagnosis Date  . Allergy   . Arthritis   . Complication of anesthesia    :"need catheter"  . Dysrhythmia    ? something told by reg dr  unable to say what it was  .  Esophageal tear   . Hypothyroidism   . Pneumonia    hx  . 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 Rotator cuff tear    Past Surgical History:  Procedure Laterality Date  . blepharaplasty    . CAUTERY OF TURBINATES    . COLONOSCOPY WITH PROPOFOL N/A 07/19/2015   Procedure: COLONOSCOPY WITH PROPOFOL;  Surgeon: [name], MD;  Location: [facility] ENDOSCOPY;  Service: Endoscopy;  Laterality: N/A;  . HERNIA REPAIR Left    inguinal herniorrhapy  . KNEE SURGERY     ? loose body/ chondral defect 27yrs  . ROTATOR CUFF REPAIR     left  . SPINE SURGERY    . TONSILLECTOMY    . TOTAL KNEE ARTHROPLASTY Left 06/22/2014   Procedure: TOTAL KNEE ARTHROPLASTY;  Surgeon: [name], MD;  Location: [place];  Service:  Orthopedics;  Laterality: Left;   Family History  Problem Relation Age of Onset  . Alcohol abuse Mother   . Diabetes Mother   . Mental retardation Mother    Social History   Occupational History  . textiles    Social History Main Topics  . Smoking status: Former Smoker    Packs/day: 1.00    Years: 15.00    Types: Cigarettes    Quit date: 06/15/1983  . Smokeless tobacco: Current User    Types: Chew  . Alcohol use No     Comment: quit 10-2007  . Drug use: No  . Sexual activity: Not Currently   Tobacco Counseling Ready to quit: Not Answered Counseling given: Not Answered   Activities of Daily Living In your present state of health, do you have any difficulty performing the following activities: 05/29/2016  Hearing? Y  Vision? N  Difficulty concentrating or making decisions? N  Walking or climbing stairs? N  Dressing or bathing? N  Doing errands, shopping? N  Preparing Food and eating ? N  Using the Toilet? N  In the past six months, have you accidently leaked urine? N  Do you have problems with loss of bowel control? N  Managing your Medications? N  Managing your Finances? N  Housekeeping or managing your Housekeeping? N  Some recent data might be hidden    Immunizations and Health Maintenance Immunization History  Administered Date(s) Administered  . Hep A / Hep B 12/13/2015, 01/17/2016  . Influenza Whole 02/26/2008  . Influenza-Unspecified 02/09/2016  . Pneumococcal Conjugate-13 08/06/2013  . Pneumococcal Polysaccharide-23 05/24/2011  . Td 05/28/2004  . Tdap 02/13/2006  . Zoster 05/30/2007   Health Maintenance Due  Topic Date Due  . Samul Dada  02/14/2016    Patient Care Team: [name], MD as PCP - General (Internal Medicine)  Indicate any recent Medical Services you may have received from other than [facility] providers in the past year (date may be approximate).    Assessment:   This is a routine wellness examination for [place]. The goal of the  wellness visit is to assist the patient how to close the gaps in care and create a preventative care plan for the patient.   Taking calcium VIT D3 as appropriate/Osteoporosis reviewed.  Medications reviewed; taking without issues or barriers.  Safety issues reviewed; lives with wife.  Smoke detectors in the home. Firearms locked in a safe within the home. Wears seatbelts when driving or riding with others. No violence in the home.  No identified risk were noted; The patient was oriented x 3; appropriate in dress and manner and no objective  failures at ADL's or IADL's.   BMI; discussed the importance of a healthy diet, water intake and exercise. He has a sensible diet, moderate exercise regimen and plans to increase his water intake.  Educational material provided.  HTN; home blood pressure monitoring system used in office today for comparison.  Home reading :154/66 L arm, office reading 150/68 R arm. He plans to keep a log of home readings and bring to next visit.  Encouraged to follow up with PCP regarding elevated BP.    TDAP vaccine deferred per patient request.  Educational material provided.  Patient Concerns: None at this time. Follow up with PCP as needed.  Hearing/Vision screen Hearing Screening Comments: Followed by [facility] Hearing aids, bilateral Vision Screening Comments: Followed  by [facility] visits Wears glasses for reading  Dietary issues and exercise activities discussed: Current Exercise Habits: Structured exercise class, Type of exercise: calisthenics;strength training/weights;stretching, Time (Minutes): > 60, Frequency (Times/Week): 5, Weekly Exercise (Minutes/Week): 0, Intensity: Mild  Goals    . Exercise 150 minutes per week (moderate activity)    . Increase water intake          Stay hydrated and drink plenty of water/fluids      Depression Screen PHQ 2/9 Scores 05/29/2016 04/13/2015  PHQ - 2 Score 0 0    Fall Risk Fall  Risk  05/29/2016 04/13/2015  Falls in the past year? No No    Cognitive Function:     6CIT Screen 05/29/2016  What Year? 0 points  What month? 0 points  What time? 0 points  Count back from 20 0 points  Months in reverse 0 points  Repeat phrase 0 points  Total Score 0    Screening Tests Health Maintenance  Topic Date Due  . TETANUS/TDAP  02/14/2016  . COLONOSCOPY  12/07/2020  . INFLUENZA VACCINE  Completed  . ZOSTAVAX  Completed  . PNA vac Low Risk Adult  Completed        Plan:    End of life planning; Advance aging; Advanced directives discussed. Copy of current HCPOA/Living Will on file.  Medicare Attestation I have personally reviewed: The patient's medical and social history Their use of alcohol, tobacco or illicit drugs Their current medications and supplements The patient's functional ability including ADLs,fall risks, home safety risks, cognitive, and hearing and visual impairment Diet and physical activities Evidence for depression   The patient's weight, height, BMI, and visual acuity have been recorded in the chart.  I have made referrals and provided education to the patient based on review of the above and I have provided the patient with a written personalized care plan for preventive services.    During the course of the visit Keshav was educated and counseled about the following appropriate screening and preventive services:   Vaccines to include Pneumoccal, Influenza, Hepatitis B, Td, Zostavax, HCV  Electrocardiogram  Colorectal cancer screening  Cardiovascular disease screening  Diabetes screening  Glaucoma screening  Nutrition counseling  Prostate cancer screening  Smoking cessation counseling  Patient Instructions (the written plan) were given to the patient.   Varney Biles, LPN   [id number]

## 2016-05-30 ENCOUNTER — Ambulatory Visit: Payer: Self-pay

## 2016-05-30 NOTE — Progress Notes (Signed)
  I have reviewed the above information and agree with above.   Lionel Woodberry, MD 

## 2016-06-19 ENCOUNTER — Ambulatory Visit: Payer: Self-pay

## 2016-06-26 ENCOUNTER — Ambulatory Visit: Payer: PPO | Attending: Otolaryngology

## 2016-06-26 ENCOUNTER — Ambulatory Visit (INDEPENDENT_AMBULATORY_CARE_PROVIDER_SITE_OTHER): Payer: PPO

## 2016-06-26 DIAGNOSIS — F5101 Primary insomnia: Secondary | ICD-10-CM | POA: Diagnosis not present

## 2016-06-26 DIAGNOSIS — G4733 Obstructive sleep apnea (adult) (pediatric): Secondary | ICD-10-CM | POA: Diagnosis not present

## 2016-06-26 DIAGNOSIS — Z23 Encounter for immunization: Secondary | ICD-10-CM | POA: Diagnosis not present

## 2016-06-26 NOTE — Progress Notes (Addendum)
Patient comes in for 3 rd Hep A/B injection.  Injected left deltoid.  Patient tolerated injection well .   Reviewed.  Dr Nicki Reaper

## 2016-07-04 DIAGNOSIS — N200 Calculus of kidney: Secondary | ICD-10-CM | POA: Diagnosis not present

## 2016-07-04 DIAGNOSIS — K7469 Other cirrhosis of liver: Secondary | ICD-10-CM | POA: Diagnosis not present

## 2016-07-04 DIAGNOSIS — K7460 Unspecified cirrhosis of liver: Secondary | ICD-10-CM | POA: Diagnosis not present

## 2016-07-04 DIAGNOSIS — K766 Portal hypertension: Secondary | ICD-10-CM | POA: Diagnosis not present

## 2016-07-04 DIAGNOSIS — K7689 Other specified diseases of liver: Secondary | ICD-10-CM | POA: Diagnosis not present

## 2016-07-04 DIAGNOSIS — R3129 Other microscopic hematuria: Secondary | ICD-10-CM | POA: Diagnosis not present

## 2016-07-05 DIAGNOSIS — R319 Hematuria, unspecified: Secondary | ICD-10-CM | POA: Diagnosis not present

## 2016-07-05 DIAGNOSIS — R3121 Asymptomatic microscopic hematuria: Secondary | ICD-10-CM | POA: Diagnosis not present

## 2016-07-17 ENCOUNTER — Telehealth: Payer: Self-pay | Admitting: Internal Medicine

## 2016-07-17 NOTE — Telephone Encounter (Signed)
Pt dropped off lab results with note to Dr. Derrel Nip. Envelope is up front in Dr.Tullo's  color folder.

## 2016-07-17 NOTE — Telephone Encounter (Signed)
Labs are in PCP's yellow folder for review along with pt's hand written letter.

## 2016-07-18 NOTE — Telephone Encounter (Signed)
The fasting labs that I need, I have requested in a letter,  Not all of the ones that he likes to get because I have no use for them and do not see the  point in ordering them .plase print letter,  I cannot from home.

## 2016-07-19 ENCOUNTER — Telehealth: Payer: Self-pay | Admitting: Internal Medicine

## 2016-07-19 NOTE — Telephone Encounter (Signed)
Letter printed/pending MD sig. Will call pt once signed.

## 2016-07-19 NOTE — Telephone Encounter (Signed)
My chart message sent addressing his concerns about starting alendronate

## 2016-07-20 NOTE — Telephone Encounter (Signed)
L/m for patient to let him know that lab order will be left at reception for pick up.

## 2016-07-31 ENCOUNTER — Telehealth: Payer: Self-pay | Admitting: *Deleted

## 2016-07-31 ENCOUNTER — Ambulatory Visit
Admission: RE | Admit: 2016-07-31 | Discharge: 2016-07-31 | Disposition: A | Discharge status: Home / Self Care | Payer: PPO | Source: Ambulatory Visit | Attending: Internal Medicine | Admitting: Internal Medicine

## 2016-07-31 DIAGNOSIS — K7469 Other cirrhosis of liver: Secondary | ICD-10-CM

## 2016-07-31 DIAGNOSIS — K740 Hepatic fibrosis: Secondary | ICD-10-CM | POA: Diagnosis not present

## 2016-07-31 DIAGNOSIS — K7460 Unspecified cirrhosis of liver: Secondary | ICD-10-CM | POA: Diagnosis not present

## 2016-07-31 NOTE — Telephone Encounter (Signed)
I called Abigail Butts and answered her question regarding the patient's lab orders.

## 2016-07-31 NOTE — Telephone Encounter (Signed)
Abigail Butts from [facility] has requested some call in reference to lab orders for pt. Contact [phone]

## 2016-08-01 DIAGNOSIS — E291 Testicular hypofunction: Secondary | ICD-10-CM | POA: Diagnosis not present

## 2016-08-02 ENCOUNTER — Ambulatory Visit: Payer: PPO | Attending: Otolaryngology

## 2016-08-02 DIAGNOSIS — G4733 Obstructive sleep apnea (adult) (pediatric): Secondary | ICD-10-CM | POA: Diagnosis not present

## 2016-08-02 DIAGNOSIS — F5101 Primary insomnia: Secondary | ICD-10-CM | POA: Insufficient documentation

## 2016-08-02 NOTE — Telephone Encounter (Signed)
Mailed unread message

## 2016-08-03 ENCOUNTER — Encounter: Payer: Self-pay | Admitting: Internal Medicine

## 2016-08-03 DIAGNOSIS — M5137 Other intervertebral disc degeneration, lumbosacral region: Secondary | ICD-10-CM | POA: Diagnosis not present

## 2016-08-03 DIAGNOSIS — M961 Postlaminectomy syndrome, not elsewhere classified: Secondary | ICD-10-CM | POA: Diagnosis not present

## 2016-08-03 DIAGNOSIS — M25562 Pain in left knee: Secondary | ICD-10-CM | POA: Diagnosis not present

## 2016-08-08 ENCOUNTER — Ambulatory Visit: Payer: PPO

## 2016-08-17 NOTE — Telephone Encounter (Signed)
Mailed unread message to patient.  

## 2016-08-22 DIAGNOSIS — M25562 Pain in left knee: Secondary | ICD-10-CM | POA: Diagnosis not present

## 2016-08-22 DIAGNOSIS — M5137 Other intervertebral disc degeneration, lumbosacral region: Secondary | ICD-10-CM | POA: Diagnosis not present

## 2016-09-06 DIAGNOSIS — M5137 Other intervertebral disc degeneration, lumbosacral region: Secondary | ICD-10-CM | POA: Diagnosis not present

## 2016-09-06 DIAGNOSIS — M25562 Pain in left knee: Secondary | ICD-10-CM | POA: Diagnosis not present

## 2016-09-12 ENCOUNTER — Telehealth: Payer: Self-pay | Admitting: Internal Medicine

## 2016-09-12 NOTE — Telephone Encounter (Signed)
Patient came into clinic this morning wanting an update to his quarterly Lab orders to add A1c patient is seeing an herbalist type medicine person next week and wanted to have labs drawn , I have written the order using previous copy of PCP lab order letter, Janeal Holmes placed copy in quick sign if PCP agrees.

## 2016-09-14 LAB — BASIC METABOLIC PANEL
BUN: 29 mg/dL — AB (ref 4–21)
Creatinine: 1.2 mg/dL (ref 0.6–1.3)
Glucose: 96 mg/dL
Sodium: 139 mmol/L (ref 137–147)

## 2016-09-14 LAB — HEPATIC FUNCTION PANEL
ALT: 32 U/L (ref 10–40)
AST: 33 U/L (ref 14–40)
Alkaline Phosphatase: 109 U/L (ref 25–125)
Bilirubin, Total: 0.8 mg/dL

## 2016-09-14 LAB — TSH: TSH: 2.46 u[IU]/mL (ref 0.41–5.90)

## 2016-09-14 LAB — CBC AND DIFFERENTIAL
HCT: 45 % (ref 41–53)
Hemoglobin: 15.5 g/dL (ref 13.5–17.5)
Platelets: 132 10*3/uL — AB (ref 150–399)
WBC: 5.4 10^3/mL

## 2016-09-14 LAB — LIPID PANEL
Cholesterol: 182 mg/dL (ref 0–200)
HDL: 82 mg/dL — AB (ref 35–70)
LDL Cholesterol: 91 mg/dL
Triglycerides: 47 mg/dL (ref 40–160)

## 2016-09-14 LAB — HEMOGLOBIN A1C: Hemoglobin A1C: 4.9

## 2016-09-18 ENCOUNTER — Telehealth: Payer: Self-pay | Admitting: Internal Medicine

## 2016-09-18 NOTE — Telephone Encounter (Signed)
Pt dropped off lab results for Dr. Derrel Nip. Results are up front in Dr. Lupita Dawn color folder.

## 2016-09-18 NOTE — Telephone Encounter (Signed)
Placed in your yellow folder for review.

## 2016-09-20 DIAGNOSIS — M5137 Other intervertebral disc degeneration, lumbosacral region: Secondary | ICD-10-CM | POA: Diagnosis not present

## 2016-09-20 DIAGNOSIS — M2140 Flat foot [pes planus] (acquired), unspecified foot: Secondary | ICD-10-CM | POA: Diagnosis not present

## 2016-09-20 DIAGNOSIS — Z85828 Personal history of other malignant neoplasm of skin: Secondary | ICD-10-CM | POA: Diagnosis not present

## 2016-09-20 DIAGNOSIS — D225 Melanocytic nevi of trunk: Secondary | ICD-10-CM | POA: Diagnosis not present

## 2016-09-20 DIAGNOSIS — M25562 Pain in left knee: Secondary | ICD-10-CM | POA: Diagnosis not present

## 2016-09-20 DIAGNOSIS — M7742 Metatarsalgia, left foot: Secondary | ICD-10-CM | POA: Diagnosis not present

## 2016-09-20 DIAGNOSIS — L821 Other seborrheic keratosis: Secondary | ICD-10-CM | POA: Diagnosis not present

## 2016-09-20 DIAGNOSIS — M7741 Metatarsalgia, right foot: Secondary | ICD-10-CM | POA: Diagnosis not present

## 2016-09-20 DIAGNOSIS — D2261 Melanocytic nevi of right upper limb, including shoulder: Secondary | ICD-10-CM | POA: Diagnosis not present

## 2016-09-24 NOTE — Telephone Encounter (Signed)
Spoke with pt and informed him that his labs were normal and there was no changes needed at this time. Pt gave a verbal understanding.

## 2016-09-24 NOTE — Telephone Encounter (Signed)
LMTCB

## 2016-09-24 NOTE — Telephone Encounter (Signed)
[place] labs received and reviewed, look good:  nothing to worry about or change

## 2016-09-24 NOTE — Telephone Encounter (Signed)
Labs abstracted 

## 2016-09-26 DIAGNOSIS — S46011A Strain of muscle(s) and tendon(s) of the rotator cuff of right shoulder, initial encounter: Secondary | ICD-10-CM | POA: Diagnosis not present

## 2016-09-26 DIAGNOSIS — S92324A Nondisplaced fracture of second metatarsal bone, right foot, initial encounter for closed fracture: Secondary | ICD-10-CM | POA: Diagnosis not present

## 2016-10-08 DIAGNOSIS — M25511 Pain in right shoulder: Secondary | ICD-10-CM | POA: Diagnosis not present

## 2016-10-10 DIAGNOSIS — M25511 Pain in right shoulder: Secondary | ICD-10-CM | POA: Diagnosis not present

## 2016-10-23 ENCOUNTER — Encounter: Payer: Self-pay | Admitting: Family Medicine

## 2016-10-23 ENCOUNTER — Ambulatory Visit (INDEPENDENT_AMBULATORY_CARE_PROVIDER_SITE_OTHER): Payer: PPO

## 2016-10-23 ENCOUNTER — Ambulatory Visit (INDEPENDENT_AMBULATORY_CARE_PROVIDER_SITE_OTHER): Payer: PPO | Admitting: Family Medicine

## 2016-10-23 VITALS — BP 140/60 | HR 88 | Temp 98.7°F | Wt 170.2 lb

## 2016-10-23 DIAGNOSIS — S6992XA Unspecified injury of left wrist, hand and finger(s), initial encounter: Secondary | ICD-10-CM | POA: Diagnosis not present

## 2016-10-23 NOTE — Assessment & Plan Note (Signed)
Lesion appears most consistent with a wart though given his injury to the area previously and concern for potential foreign body we will obtain an x-ray to evaluate for retained foreign body. If there is a retained foreign body he will need to see hand surgery to have this removed. If no retained foreign body could treat as a wart.

## 2016-10-23 NOTE — Patient Instructions (Signed)
Nice to meet you. We are going to x-ray your hand to evaluate for glass. We will determine the next step in management once this returns.

## 2016-10-23 NOTE — Progress Notes (Signed)
  Tommi Rumps, MD Phone: [phone]  Tony Luna is a 76 y.o. male who presents today for same-day visit.  Patient notes a little over a month ago he had an injury to his left pinky. Notes he was picking up a glass tabletop and it broke and there was a small laceration. Notes he got the bleeding to stop and did not notice any foreign bodies. Notes that healed up well though over the last 2 weeks he has developed a callus over the palmar aspect of his left little finger. Notes occasionally it will hurt. Occasionally it will be slightly irritated and red. He does not feel any sharp shooting pains.  ROS see history of present illness  Objective  Physical Exam Vitals:   10/23/16 1321  BP: 140/60  Pulse: 88  Temp: 98.7 F (37.1 C)    BP Readings from Last 3 Encounters:  10/23/16 140/60  05/29/16 (!) 150/68  04/18/16 140/68   Wt Readings from Last 3 Encounters:  10/23/16 170 lb 3.2 oz (77.2 kg)  05/29/16 178 lb 1.9 oz (80.8 kg)  04/18/16 178 lb 8 oz (81 kg)    Physical Exam  Constitutional: He is well-developed, well-nourished, and in no distress.  Cardiovascular: Normal rate, regular rhythm and normal heart sounds.   Pulmonary/Chest: Effort normal and breath sounds normal.  Skin:  Left palmar aspect fifth finger between the PIP and DIP joint with area of callus that appears to be somewhat like a wart, no palpable tenderness or erythema, no induration, no palpable foreign body, sensation light touch intact in the little finger, good capillary refill     Assessment/Plan: Please see individual problem list.  Hand injury, left, initial encounter Lesion appears most consistent with a wart though given his injury to the area previously and concern for potential foreign body we will obtain an x-ray to evaluate for retained foreign body. If there is a retained foreign body he will need to see hand surgery to have this removed. If no retained foreign body could treat as a  wart.   Orders Placed This Encounter  Procedures  . DG Hand Complete Left    Standing Status:   Future    Number of Occurrences:   1    Standing Expiration Date:   12/23/2017    Order Specific Question:   Reason for Exam (SYMPTOM  OR DIAGNOSIS REQUIRED)    Answer:   injury to left 5th finger with glass, evaluate for foreign body    Order Specific Question:   Preferred imaging location?    Answer:   [employer] Specific Question:   Radiology Contrast Protocol - do NOT remove file path    Answer:   [email or web address]    Tommi Rumps, MD [place]

## 2016-10-24 DIAGNOSIS — M25511 Pain in right shoulder: Secondary | ICD-10-CM | POA: Diagnosis not present

## 2016-10-24 DIAGNOSIS — S46011D Strain of muscle(s) and tendon(s) of the rotator cuff of right shoulder, subsequent encounter: Secondary | ICD-10-CM | POA: Diagnosis not present

## 2016-10-24 DIAGNOSIS — M7541 Impingement syndrome of right shoulder: Secondary | ICD-10-CM | POA: Diagnosis not present

## 2016-10-30 DIAGNOSIS — M25511 Pain in right shoulder: Secondary | ICD-10-CM | POA: Diagnosis not present

## 2016-10-30 DIAGNOSIS — S46011D Strain of muscle(s) and tendon(s) of the rotator cuff of right shoulder, subsequent encounter: Secondary | ICD-10-CM | POA: Diagnosis not present

## 2016-10-30 DIAGNOSIS — M7541 Impingement syndrome of right shoulder: Secondary | ICD-10-CM | POA: Diagnosis not present

## 2016-11-01 DIAGNOSIS — S46011D Strain of muscle(s) and tendon(s) of the rotator cuff of right shoulder, subsequent encounter: Secondary | ICD-10-CM | POA: Diagnosis not present

## 2016-11-01 DIAGNOSIS — M25511 Pain in right shoulder: Secondary | ICD-10-CM | POA: Diagnosis not present

## 2016-11-01 DIAGNOSIS — M7541 Impingement syndrome of right shoulder: Secondary | ICD-10-CM | POA: Diagnosis not present

## 2016-11-01 DIAGNOSIS — E291 Testicular hypofunction: Secondary | ICD-10-CM | POA: Diagnosis not present

## 2016-11-02 DIAGNOSIS — E291 Testicular hypofunction: Secondary | ICD-10-CM | POA: Diagnosis not present

## 2016-11-02 DIAGNOSIS — N529 Male erectile dysfunction, unspecified: Secondary | ICD-10-CM | POA: Diagnosis not present

## 2016-11-02 DIAGNOSIS — R6882 Decreased libido: Secondary | ICD-10-CM | POA: Diagnosis not present

## 2016-11-02 DIAGNOSIS — M2550 Pain in unspecified joint: Secondary | ICD-10-CM | POA: Diagnosis not present

## 2016-11-02 DIAGNOSIS — G479 Sleep disorder, unspecified: Secondary | ICD-10-CM | POA: Diagnosis not present

## 2016-11-06 DIAGNOSIS — M7741 Metatarsalgia, right foot: Secondary | ICD-10-CM | POA: Diagnosis not present

## 2016-11-06 DIAGNOSIS — M7742 Metatarsalgia, left foot: Secondary | ICD-10-CM | POA: Diagnosis not present

## 2016-11-06 DIAGNOSIS — M25562 Pain in left knee: Secondary | ICD-10-CM | POA: Diagnosis not present

## 2016-11-06 DIAGNOSIS — M2140 Flat foot [pes planus] (acquired), unspecified foot: Secondary | ICD-10-CM | POA: Diagnosis not present

## 2016-11-06 DIAGNOSIS — M5137 Other intervertebral disc degeneration, lumbosacral region: Secondary | ICD-10-CM | POA: Diagnosis not present

## 2016-11-08 DIAGNOSIS — M7541 Impingement syndrome of right shoulder: Secondary | ICD-10-CM | POA: Diagnosis not present

## 2016-11-08 DIAGNOSIS — S46011D Strain of muscle(s) and tendon(s) of the rotator cuff of right shoulder, subsequent encounter: Secondary | ICD-10-CM | POA: Diagnosis not present

## 2016-11-08 DIAGNOSIS — M25511 Pain in right shoulder: Secondary | ICD-10-CM | POA: Diagnosis not present

## 2016-11-14 DIAGNOSIS — S46011D Strain of muscle(s) and tendon(s) of the rotator cuff of right shoulder, subsequent encounter: Secondary | ICD-10-CM | POA: Diagnosis not present

## 2016-11-19 DIAGNOSIS — S46011D Strain of muscle(s) and tendon(s) of the rotator cuff of right shoulder, subsequent encounter: Secondary | ICD-10-CM | POA: Diagnosis not present

## 2016-11-21 DIAGNOSIS — S46011D Strain of muscle(s) and tendon(s) of the rotator cuff of right shoulder, subsequent encounter: Secondary | ICD-10-CM | POA: Diagnosis not present

## 2016-11-21 DIAGNOSIS — M7541 Impingement syndrome of right shoulder: Secondary | ICD-10-CM | POA: Diagnosis not present

## 2016-11-21 DIAGNOSIS — M25511 Pain in right shoulder: Secondary | ICD-10-CM | POA: Diagnosis not present

## 2016-11-26 ENCOUNTER — Other Ambulatory Visit: Payer: Self-pay | Admitting: Internal Medicine

## 2016-12-17 DIAGNOSIS — M75111 Incomplete rotator cuff tear or rupture of right shoulder, not specified as traumatic: Secondary | ICD-10-CM | POA: Diagnosis not present

## 2016-12-17 DIAGNOSIS — Z96652 Presence of left artificial knee joint: Secondary | ICD-10-CM | POA: Diagnosis not present

## 2016-12-25 DIAGNOSIS — M2140 Flat foot [pes planus] (acquired), unspecified foot: Secondary | ICD-10-CM | POA: Diagnosis not present

## 2016-12-25 DIAGNOSIS — M5137 Other intervertebral disc degeneration, lumbosacral region: Secondary | ICD-10-CM | POA: Diagnosis not present

## 2016-12-25 DIAGNOSIS — M7742 Metatarsalgia, left foot: Secondary | ICD-10-CM | POA: Diagnosis not present

## 2016-12-25 DIAGNOSIS — M25562 Pain in left knee: Secondary | ICD-10-CM | POA: Diagnosis not present

## 2016-12-25 DIAGNOSIS — M7741 Metatarsalgia, right foot: Secondary | ICD-10-CM | POA: Diagnosis not present

## 2016-12-26 DIAGNOSIS — M75111 Incomplete rotator cuff tear or rupture of right shoulder, not specified as traumatic: Secondary | ICD-10-CM | POA: Diagnosis not present

## 2017-01-10 ENCOUNTER — Encounter: Payer: Self-pay | Admitting: Physical Therapy

## 2017-01-10 ENCOUNTER — Ambulatory Visit: Payer: PPO | Attending: Orthopedic Surgery | Admitting: Physical Therapy

## 2017-01-10 DIAGNOSIS — M25511 Pain in right shoulder: Secondary | ICD-10-CM | POA: Insufficient documentation

## 2017-01-10 DIAGNOSIS — M6281 Muscle weakness (generalized): Secondary | ICD-10-CM

## 2017-01-11 NOTE — Therapy (Signed)
[facility] [address], [place], [address] Phone: [phone]   Fax:  [phone]  Physical Therapy Evaluation  Patient Details  Name: Tony Luna MRN: [id number] Date of Birth: [birth year] Referring Provider: Marchia Bond MD  Encounter Date: 01/10/2017      PT End of Session - 01/10/17 1100    Visit Number 1   Number of Visits 12   Date for PT Re-Evaluation 02/21/17   Authorization Type 1   Authorization Time Period 10 G code   PT Start Time [phone]   PT Stop Time 1013   PT Time Calculation (min) 78 min   Activity Tolerance Patient tolerated treatment well   Behavior During Therapy [facility] for tasks assessed/performed      Past Medical History:  Diagnosis Date  . Allergy   . Arthritis   . Complication of anesthesia    :"need catheter"  . Dysrhythmia    ? something told by reg dr  unable to say what it was  . Esophageal tear   . Hypothyroidism   . Pneumonia    hx  . 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 Rotator cuff tear     Past Surgical History:  Procedure Laterality Date  . blepharaplasty    . CAUTERY OF TURBINATES    . COLONOSCOPY WITH PROPOFOL N/A 07/19/2015   Procedure: COLONOSCOPY WITH PROPOFOL;  Surgeon: [name], MD;  Location: [facility] ENDOSCOPY;  Service: Endoscopy;  Laterality: N/A;  . HERNIA REPAIR Left    inguinal herniorrhapy  . KNEE SURGERY     ? loose body/ chondral defect 59yrs  . ROTATOR CUFF REPAIR     left  . SPINE SURGERY    . TONSILLECTOMY    . TOTAL KNEE ARTHROPLASTY Left 06/22/2014   Procedure: TOTAL KNEE ARTHROPLASTY;  Surgeon: [name], MD;  Location: [place];  Service: Orthopedics;  Laterality: Left;    There were no vitals filed for this visit.       Subjective Assessment - 01/10/17 0915    Subjective Patient reports he is having pain in right shoulder that is improving since injuring it in May 2018. He is currently having intermittent  symptoms and is progressing exercises at local gym with caution.    Pertinent History Patient reports injuring right shoulder while exercising in gym seated chest press in May 2018 with no immediate pain and had soreness and  had MRI with results of massive RTC tear/ left RTC repair 10-15 years ago   Limitations Lifting;Other (comment)  reaching out to side and overhead activities   Patient Stated Goals to be able to perform activties without pain, difficulty and be able to progress exercises without aggravating right shoulder pain   Currently in Pain? No/denies            [facility] PT Assessment - 01/10/17 2235      Assessment   Medical Diagnosis right shoulder pain massive cuff tear   Referring Provider Marchia Bond MD   Onset Date/Surgical Date 12/17/16   Hand Dominance Right   Prior Therapy none     Precautions   Precaution Comments right RTC tears supraspinatus, infraspinatus with retraction 4 cm     Balance Screen   Has the patient fallen in the past 6 months No     Prior Function   Level of Independence Independent   Vocation Retired   Leisure exercise, outdoors     [employer]  Overall Cognitive Status Within Functional Limits for tasks assessed     Observation/Other Assessments   Quick DASH  30% (0 = no self perceived disability)     ROM / Strength   AROM / PROM / Strength AROM;Strength     AROM   Overall AROM Comments limited IR behind back right UE due to pain     Strength   Overall Strength Comments WFL with exception of right shoulder ER, scapular control, abduction, flexion Left UE WNL Right UE limited due to RTC tear     Palpation   Palpation comment decreased muscle bulk along right pericapular muscles and posterior right shoulder      Objective measurements completed on examination: See above findings.         PT Education - 01/10/17 1100    Education provided Yes   Education Details instruction in shoulder anatomy, proper positioning of  shoulder and motor control for scapula   Person(s) Educated Patient   Methods Explanation;Demonstration;Tactile cues;Verbal cues   Comprehension Verbalized understanding;Returned demonstration;Verbal cues required             PT Long Term Goals - 01/10/17 1200      PT LONG TERM GOAL #1   Title improve Quickdash score to <15% demonstrating improved functional use right UE with less difficulty and pain   Baseline 30%   Status New   Target Date 02/21/17     PT LONG TERM GOAL #2   Title indpendent with home program for flexibiltiy, strengthening in order to be able to transition to self managment once discharged from physical therapy   Baseline limited knowledge of precautions, progression of exercises without assistance, guidance   Status New   Target Date 02/21/17                Plan - 01/10/17 1100    Clinical Impression Statement Patient is a 59 right hand domninant male who presents with right Rotator cuff tear and has not had surgery. He is improving with function and strength since the injury and has limitations with raising arm out to side and overhead and reaching back. He has Quick Dash score of 30% indicating moderate self perceived disabitlity. He has limited knoweldge of appropriate precautions, posture and exercises to perform in order to continue to progress and will therefore benefit from physical therapy intervention.    History and Personal Factors relevant to plan of care: massive RTC tear infraspinatus and supraspinatus with retraction, without surgery. improving at this time. very active and exercises regularly.    Clinical Presentation Stable   Clinical Presentation due to: improving strength, pain in shoulder since injury   Clinical Decision Making Low   Rehab Potential Good   Clinical Impairments Affecting Rehab Potential (+)motivated, prior level of function(-)massive tear without surgery right RTC   PT Frequency 2x / week   PT Duration 6 weeks   PT  Treatment/Interventions Patient/family education;Electrical Stimulation;Moist Heat;Neuromuscular re-education;Therapeutic exercise;Manual techniques   PT Next Visit Plan modalities for neuromuscular re-education, therapeutic exercises   PT Home Exercise Plan scapular control, retraction exercises, precautions to avoid stress on shoulder   Consulted and Agree with Plan of Care Patient      Patient will benefit from skilled therapeutic intervention in order to improve the following deficits and impairments:  Decreased strength, Impaired perceived functional ability, Decreased activity tolerance, Impaired UE functional use  Visit Diagnosis: Muscle weakness (generalized) - Plan: PT plan of care cert/re-cert  Acute pain of right shoulder -  Plan: PT plan of care cert/re-cert      G-Codes - 46/95/07 1210    Functional Assessment Tool Used (Outpatient Only) QuickDash, ROM, strength, pain, clinical judgment   Functional Limitation Carrying, moving and handling objects   Carrying, Moving and Handling Objects Current Status (K2575) At least 20 percent but less than 40 percent impaired, limited or restricted   Carrying, Moving and Handling Objects Goal Status (Y5183) At least 1 percent but less than 20 percent impaired, limited or restricted       Problem List Patient Active Problem List   Diagnosis Date Noted  . Hand injury, left, initial encounter 10/23/2016  . Osteoporosis 03/29/2016  . Stress fracture of metatarsal due to multiple or repetitive stress 01/08/2016  . White coat syndrome with high blood pressure without hypertension 12/15/2015  . Tubular adenoma of colon   . Arthritis 04/13/2015  . Body mass index (BMI) of 24.0-24.9 in adult 04/13/2015  . 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 Cardiac murmur 04/13/2015  . Hypotestosteronism 04/13/2015  . Enlargement of spleen 04/13/2015  . Thrombocytopenia ([place]) 04/13/2015  . Benign prostatic hyperplasia with  urinary obstruction 03/25/2015  . 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 Knee osteoarthritis 06/22/2014  . 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 BICEPS TENDON RUPTURE, RIGHT 08/14/2010  . ROTATOR CUFF SYNDROME, RIGHT 08/31/2008  . DEGENERATIVE JOINT DISEASE 03/03/2008  . DEGENERATIVE JOINT DISEASE, LEFT KNEE 03/03/2008  . OTHER ACQUIRED DEFORMITY OF OTHER PARTS OF LIMB 03/03/2008    Jomarie Longs PT 01/11/2017, 2:54 PM  [facility] [address] [place], [address] Phone: [phone]   Fax:  [phone]  Name: Tony Luna MRN: [id number] Date of Birth: 20-Mar-1941

## 2017-01-15 ENCOUNTER — Ambulatory Visit: Payer: PPO | Admitting: Physical Therapy

## 2017-01-15 ENCOUNTER — Encounter: Payer: Self-pay | Admitting: Physical Therapy

## 2017-01-15 DIAGNOSIS — M25511 Pain in right shoulder: Secondary | ICD-10-CM

## 2017-01-15 DIAGNOSIS — M6281 Muscle weakness (generalized): Secondary | ICD-10-CM | POA: Diagnosis not present

## 2017-01-15 NOTE — Therapy (Signed)
[facility] [address] [place], [address] Phone: [phone]   Fax:  [phone]  Physical Therapy Treatment  Patient Details  Name: Tony Luna MRN: [id number] Date of Birth: February 12, 1941 Referring Provider: Marchia Bond MD  Encounter Date: 01/15/2017      PT End of Session - 01/15/17 0958    Visit Number 2   Number of Visits 12   Date for PT Re-Evaluation 02/21/17   Authorization Type 2   Authorization Time Period 10 G code   PT Start Time [phone]   PT Stop Time 1045   PT Time Calculation (min) 58 min   Activity Tolerance Patient tolerated treatment well   Behavior During Therapy [facility] for tasks assessed/performed      Past Medical History:  Diagnosis Date  . Allergy   . Arthritis   . Complication of anesthesia    :"need catheter"  . Dysrhythmia    ? something told by reg dr  unable to say what it was  . Esophageal tear   . Hypothyroidism   . Pneumonia    hx  . 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 Rotator cuff tear     Past Surgical History:  Procedure Laterality Date  . blepharaplasty    . CAUTERY OF TURBINATES    . COLONOSCOPY WITH PROPOFOL N/A 07/19/2015   Procedure: COLONOSCOPY WITH PROPOFOL;  Surgeon: [name], MD;  Location: [facility];  Service: Endoscopy;  Laterality: N/A;  . HERNIA REPAIR Left    inguinal herniorrhapy  . KNEE SURGERY     ? loose body/ chondral defect 50yrs  . ROTATOR CUFF REPAIR     left  . SPINE SURGERY    . TONSILLECTOMY    . TOTAL KNEE ARTHROPLASTY Left 06/22/2014   Procedure: TOTAL KNEE ARTHROPLASTY;  Surgeon: [name], MD;  Location: [place];  Service: Orthopedics;  Laterality: Left;    There were no vitals filed for this visit.      Subjective Assessment - 01/15/17 0956    Subjective Patient has questions regarding exercises and would like to go over what he is doing in the gym. He reports intermittent pain in right  shoulder with reaching out to side. Overall he feels he is conitning to improve.    Pertinent History Patient reports injuring right shoulder while exercising in gym seated chest press in May 2018 with no immediate pain and had soreness and  had MRI with results of massive RTC tear/ left RTC repair 10-15 years ago   Limitations Lifting;Other (comment)  reaching out to side and overhead activities   Patient Stated Goals to be able to perform activties without pain, difficulty and be able to progress exercises without aggravating right shoulder pain   Currently in Pain? No/denies       Objective: AROM: right shoulder forward elevation, rotations WFL with mild discomfort  Treatment: Therapeutic exercise: patient performed exercises with verbal, tactile cues and demonstration of therapist: goals: independent with home program, improved QuickDash OMEGA cable exercises:  Straight arm pull downs 20# x 15 reps Reverse chin ups 25# seated in chair x 15 reps Triceps instruction  At wall 4# dumbbells ER of shoulder with scapular control Instructed in exercises to perform in gym with guidance to perform with correct intensity to recruit appropriate muscles without substitution: chest press through partial ROM, triceps extension, seated rows, decrease intensity to control weight and not reproduce pain in shoulder, biceps  curls  Modalities: 20 min: goal: muscle re education Electrical stimulation: Russian stim. 10/10 cycle applied (4) electrodes applied to right shoulder: along periscapular muscles and lower trapezius, posterior aspect right  shoulder: Teres muscles, intensity to contraction Ice pack applied to right shoulder in conjunction with estim: unbilled time; no adverse reaction noted  Patient response to treatment: patient demonstrated improved technique with exercises with moderate VC for correct alignment and repeated demonstration. Improved motor control with repetition and cuing.           PT Education - 01/15/17 0957    Education provided Yes   Education Details instruction in technique and positoining and gym exercises   Person(s) Educated Patient   Methods Explanation;Demonstration;Verbal cues   Comprehension Verbalized understanding;Returned demonstration;Verbal cues required             PT Long Term Goals - 01/10/17 1200      PT LONG TERM GOAL #1   Title improve Quickdash score to <15% demonstrating improved functional use right UE with less difficulty and pain   Baseline 30%   Status New   Target Date 02/21/17     PT LONG TERM GOAL #2   Title indpendent with home program for flexibiltiy, strengthening in order to be able to transition to self managment once discharged from physical therapy   Baseline limited knowledge of precautions, progression of exercises without assistance, guidance   Status New   Target Date 02/21/17               Plan - 01/15/17 1100    Clinical Impression Statement Patient is progressing with exercises and able to perform exercises with guidance and moderate cuing. He required instruction to perform appropriate exercises in order to improve strength and functional use of right UE without aggravation of pain.    Rehab Potential Good   Clinical Impairments Affecting Rehab Potential (+)motivated, prior level of function(-)massive tear without surgery right RTC   PT Frequency 2x / week   PT Duration 6 weeks   PT Treatment/Interventions Patient/family education;Electrical Stimulation;Moist Heat;Neuromuscular re-education;Therapeutic exercise;Manual techniques   PT Next Visit Plan modalities for neuromuscular re-education, therapeutic exercises   PT Home Exercise Plan scapular control, retraction exercises, precautions to avoid stress on shoulder   Consulted and Agree with Plan of Care Patient      Patient will benefit from skilled therapeutic intervention in order to improve the following deficits and impairments:   Decreased strength, Impaired perceived functional ability, Decreased activity tolerance, Impaired UE functional use  Visit Diagnosis: Muscle weakness (generalized)  Acute pain of right shoulder     Problem List Patient Active Problem List   Diagnosis Date Noted  . Hand injury, left, initial encounter 10/23/2016  . Osteoporosis 03/29/2016  . Stress fracture of metatarsal due to multiple or repetitive stress 01/08/2016  . White coat syndrome with high blood pressure without hypertension 12/15/2015  . Tubular adenoma of colon   . Arthritis 04/13/2015  . Body mass index (BMI) of 24.0-24.9 in adult 04/13/2015  . 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 Cardiac murmur 04/13/2015  . Hypotestosteronism 04/13/2015  . Enlargement of spleen 04/13/2015  . Thrombocytopenia ([place]) 04/13/2015  . 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 Knee osteoarthritis 06/22/2014  . 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 BICEPS TENDON RUPTURE, RIGHT 08/14/2010  . ROTATOR CUFF SYNDROME, RIGHT  08/31/2008  . DEGENERATIVE JOINT DISEASE 03/03/2008  . DEGENERATIVE JOINT DISEASE, LEFT KNEE 03/03/2008  . OTHER ACQUIRED DEFORMITY OF OTHER PARTS OF LIMB 03/03/2008    Jomarie Longs PT 01/16/2017, 7:54 AM  [facility] [address], [place], [address] Phone: [phone]   Fax:  [phone]  Name: WISE FEES MRN: [id number] Date of Birth: [birth year]

## 2017-01-22 ENCOUNTER — Ambulatory Visit: Payer: PPO | Admitting: Physical Therapy

## 2017-01-22 ENCOUNTER — Encounter: Payer: Self-pay | Admitting: Physical Therapy

## 2017-01-22 DIAGNOSIS — M6281 Muscle weakness (generalized): Secondary | ICD-10-CM

## 2017-01-22 DIAGNOSIS — M25511 Pain in right shoulder: Secondary | ICD-10-CM

## 2017-01-22 NOTE — Therapy (Signed)
[facility] [address], [place], [address] Phone: [phone]   Fax:  [phone]  Physical Therapy Treatment  Patient Details  Name: Tony Luna MRN: [id number] Date of Birth: 14-Dec-1940 Referring Provider: Marchia Bond MD  Encounter Date: 01/22/2017      PT End of Session - 01/22/17 0957    Visit Number 3   Number of Visits 12   Date for PT Re-Evaluation 02/21/17   Authorization Type 2   Authorization Time Period 10 G code   PT Start Time [phone]   PT Stop Time 1045   PT Time Calculation (min) 49 min   Activity Tolerance Patient tolerated treatment well   Behavior During Therapy [facility] for tasks assessed/performed      Past Medical History:  Diagnosis Date  . Allergy   . Arthritis   . Complication of anesthesia    :"need catheter"  . Dysrhythmia    ? something told by reg dr  unable to say what it was  . Esophageal tear   . Hypothyroidism   . Pneumonia    hx  . 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 Rotator cuff tear     Past Surgical History:  Procedure Laterality Date  . blepharaplasty    . CAUTERY OF TURBINATES    . COLONOSCOPY WITH PROPOFOL N/A 07/19/2015   Procedure: COLONOSCOPY WITH PROPOFOL;  Surgeon: [name], MD;  Location: [facility];  Service: Endoscopy;  Laterality: N/A;  . HERNIA REPAIR Left    inguinal herniorrhapy  . KNEE SURGERY     ? loose body/ chondral defect 86yrs  . ROTATOR CUFF REPAIR     left  . SPINE SURGERY    . TONSILLECTOMY    . TOTAL KNEE ARTHROPLASTY Left 06/22/2014   Procedure: TOTAL KNEE ARTHROPLASTY;  Surgeon: [name], MD;  Location: [place];  Service: Orthopedics;  Laterality: Left;    There were no vitals filed for this visit.      Subjective Assessment - 01/22/17 0956    Subjective Patient reports he is improving with right shoulder pain and strength. Patient is modifying exercises at gym and is seeing improvement with  positioning and appropriate intensity.   Pertinent History Patient reports injuring right shoulder while exercising in gym seated chest press in May 2018 with no immediate pain and had soreness and  had MRI with results of massive RTC tear/ left RTC repair 10-15 years ago   Limitations Lifting;Other (comment)  reaching out to side and overhead activities   Patient Stated Goals to be able to perform activties without pain, difficulty and be able to progress exercises without aggravating right shoulder pain   Currently in Pain? No/denies        Objective: Posture: slight forward rounded shoulders, able to correct with verbal cuing Strength: right shoulder improving with stabilization and exercises as outlined below  Treatment: Therapeutic exercise: patient performed exercises with verbal, tactile cues and demonstration of therapist: goals: independent with home program, improved QuickDash OMEGA cable exercises:  Straight arm pull downs 25# x 15 reps Reverse chin ups 35# seated in chair x 15 reps At wall 5# dumbbells ER of shoulder with scapular control with palms up x 10 and palms facing each other x 10 Body blade x 30 seconds with large and 30 seconds with small blade 2 sets each press downs and chest press  Modalities: 20 min: goal: muscle re education Electrical stimulation: [place]  stim. 10/10 cycle applied (4) electrodes applied to right shoulder: along periscapular muscles and lower trapezius, posterior aspect right  shoulder: Teres muscles, intensity to contraction Ice pack applied to right shoulder in conjunction with estim: unbilled time; no adverse reaction noted  Patient response to treatment: Patient improved technique and stabilization during exercises with minimal cuing for correct hand placement and posture. Improved muscle contraction with estim. With moderate intensity          PT Education - 01/22/17 0957    Education provided Yes   Education Details exercise  instruction   Person(s) Educated Patient   Methods Explanation;Demonstration;Verbal cues   Comprehension Verbalized understanding;Returned demonstration;Verbal cues required             PT Long Term Goals - 01/10/17 1200      PT LONG TERM GOAL #1   Title improve Quickdash score to <15% demonstrating improved functional use right UE with less difficulty and pain   Baseline 30%   Status New   Target Date 02/21/17     PT LONG TERM GOAL #2   Title indpendent with home program for flexibiltiy, strengthening in order to be able to transition to self managment once discharged from physical therapy   Baseline limited knowledge of precautions, progression of exercises without assistance, guidance   Status New   Target Date 02/21/17               Plan - 01/22/17 0958    Clinical Impression Statement Patient continues to progress with exercises, strength with exercises. Patient demonstrates steady progress towards goals with improvement noted in ROM, strength, endurance. Improved functional use with right UE. Patient will benefit from continued physical therapy intervention to address limitations and achieve goals.    Rehab Potential Good   Clinical Impairments Affecting Rehab Potential (+)motivated, prior level of function(-)massive tear without surgery right RTC   PT Frequency 2x / week   PT Duration 6 weeks   PT Treatment/Interventions Patient/family education;Electrical Stimulation;Moist Heat;Neuromuscular re-education;Therapeutic exercise;Manual techniques   PT Next Visit Plan modalities for neuromuscular re-education, therapeutic exercises   PT Home Exercise Plan scapular control, retraction exercises, precautions to avoid stress on shoulder      Patient will benefit from skilled therapeutic intervention in order to improve the following deficits and impairments:  Decreased strength, Impaired perceived functional ability, Decreased activity tolerance, Impaired UE functional  use  Visit Diagnosis: Muscle weakness (generalized)  Acute pain of right shoulder     Problem List Patient Active Problem List   Diagnosis Date Noted  . Hand injury, left, initial encounter 10/23/2016  . Osteoporosis 03/29/2016  . Stress fracture of metatarsal due to multiple or repetitive stress 01/08/2016  . White coat syndrome with high blood pressure without hypertension 12/15/2015  . Tubular adenoma of colon   . Arthritis 04/13/2015  . Body mass index (BMI) of 24.0-24.9 in adult 04/13/2015  . 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 Cardiac murmur 04/13/2015  . Hypotestosteronism 04/13/2015  . Enlargement of spleen 04/13/2015  . Thrombocytopenia ([place]) 04/13/2015  . 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 Knee osteoarthritis 06/22/2014  . 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 BICEPS TENDON RUPTURE, RIGHT 08/14/2010  . ROTATOR CUFF SYNDROME, RIGHT 08/31/2008  . DEGENERATIVE JOINT DISEASE 03/03/2008  . DEGENERATIVE JOINT DISEASE, LEFT KNEE 03/03/2008  . OTHER ACQUIRED DEFORMITY OF  OTHER PARTS OF LIMB 03/03/2008    Jomarie Longs PT 01/22/2017, 12:02 PM  [facility] [address], [place], [address] Phone: [phone]   Fax:  [phone]  Name: Tony Luna MRN: [id number] Date of Birth: [birth year]

## 2017-01-23 DIAGNOSIS — M75111 Incomplete rotator cuff tear or rupture of right shoulder, not specified as traumatic: Secondary | ICD-10-CM | POA: Diagnosis not present

## 2017-01-24 ENCOUNTER — Encounter: Payer: Self-pay | Admitting: Physical Therapy

## 2017-01-24 ENCOUNTER — Ambulatory Visit: Payer: PPO | Admitting: Physical Therapy

## 2017-01-24 DIAGNOSIS — M25511 Pain in right shoulder: Secondary | ICD-10-CM

## 2017-01-24 DIAGNOSIS — M6281 Muscle weakness (generalized): Secondary | ICD-10-CM

## 2017-01-24 NOTE — Therapy (Signed)
[facility] [address] [place], [address] Phone: [phone]   Fax:  [phone]  Physical Therapy Treatment  Patient Details  Name: Tony Luna MRN: [id number] Date of Birth: [birth year] Referring Provider: Marchia Bond MD  Encounter Date: 01/24/2017      PT End of Session - 01/24/17 1012    Visit Number 4   Number of Visits 12   Date for PT Re-Evaluation 02/21/17   Authorization Type 4   Authorization Time Period 10 G code   PT Start Time [phone]   PT Stop Time 1030   PT Time Calculation (min) 38 min   Activity Tolerance Patient tolerated treatment well   Behavior During Therapy [facility] for tasks assessed/performed      Past Medical History:  Diagnosis Date  . Allergy   . Arthritis   . Complication of anesthesia    :"need catheter"  . Dysrhythmia    ? something told by reg dr  unable to say what it was  . Esophageal tear   . Hypothyroidism   . Pneumonia    hx  . 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 Rotator cuff tear     Past Surgical History:  Procedure Laterality Date  . blepharaplasty    . CAUTERY OF TURBINATES    . COLONOSCOPY WITH PROPOFOL N/A 07/19/2015   Procedure: COLONOSCOPY WITH PROPOFOL;  Surgeon: [name], MD;  Location: [facility];  Service: Endoscopy;  Laterality: N/A;  . HERNIA REPAIR Left    inguinal herniorrhapy  . KNEE SURGERY     ? loose body/ chondral defect 52yrs  . ROTATOR CUFF REPAIR     left  . SPINE SURGERY    . TONSILLECTOMY    . TOTAL KNEE ARTHROPLASTY Left 06/22/2014   Procedure: TOTAL KNEE ARTHROPLASTY;  Surgeon: [name], MD;  Location: [place];  Service: Orthopedics;  Laterality: Left;    There were no vitals filed for this visit.      Subjective Assessment - 01/24/17 0954    Subjective Patient reports he just came from the gym and is feeling a slight twinge in his right shoulder. He is doing well. He was seen by MD and  reports that he is doing well. He feels the electrical stimulaiton is helping a great deal and requests that treatment today.    Pertinent History Patient reports injuring right shoulder while exercising in gym seated chest press in May 2018 with no immediate pain and had soreness and  had MRI with results of massive RTC tear/ left RTC repair 10-15 years ago   Limitations Lifting;Other (comment)  reaching out to side and overhead activities   Patient Stated Goals to be able to perform activties without pain, difficulty and be able to progress exercises without aggravating right shoulder pain   Currently in Pain? No/denies        Objective: Strength: right shoulder ER 4-/5 as compared to left shoulder  Treatment: Modalities:20 min: goal: muscle re education Electrical stimulation: [place] stim. 10/10 cycle applied (4) electrodes applied to right shoulder: along periscapular muscles and lower trapezius, posterior aspect right  shoulder: Teres muscles, intensity to contraction  Patient response to treatment: patient reports decreased soreness in shoulder and improved strength, contraction following estim.          PT Education - 01/24/17 1033    Education provided Yes   Education Details discussed exercises to continue at gym and proper  posiitoning and intensity of exercises   Person(s) Educated Patient   Methods Explanation   Comprehension Verbalized understanding             PT Long Term Goals - 01/10/17 1200      PT LONG TERM GOAL #1   Title improve Quickdash score to <15% demonstrating improved functional use right UE with less difficulty and pain   Baseline 30%   Status New   Target Date 02/21/17     PT LONG TERM GOAL #2   Title indpendent with home program for flexibiltiy, strengthening in order to be able to transition to self managment once discharged from physical therapy   Baseline limited knowledge of precautions, progression of exercises without assistance,  guidance   Status New   Target Date 02/21/17               Plan - 01/24/17 1013    Clinical Impression Statement Patient continues to demonstrate progress with independent exercises. He is responding favorably to estim. And that was the only treatment today due to patient just coming form gym exercises.    Rehab Potential Good   Clinical Impairments Affecting Rehab Potential (+)motivated, prior level of function(-)massive tear without surgery right RTC   PT Frequency 2x / week   PT Duration 6 weeks   PT Treatment/Interventions Patient/family education;Electrical Stimulation;Moist Heat;Neuromuscular re-education;Therapeutic exercise;Manual techniques   PT Next Visit Plan modalities for neuromuscular re-education, therapeutic exercises   PT Home Exercise Plan scapular control, retraction exercises, precautions to avoid stress on shoulder      Patient will benefit from skilled therapeutic intervention in order to improve the following deficits and impairments:  Decreased strength, Impaired perceived functional ability, Decreased activity tolerance, Impaired UE functional use  Visit Diagnosis: Muscle weakness (generalized)  Acute pain of right shoulder     Problem List Patient Active Problem List   Diagnosis Date Noted  . Hand injury, left, initial encounter 10/23/2016  . Osteoporosis 03/29/2016  . Stress fracture of metatarsal due to multiple or repetitive stress 01/08/2016  . White coat syndrome with high blood pressure without hypertension 12/15/2015  . Tubular adenoma of colon   . Arthritis 04/13/2015  . Body mass index (BMI) of 24.0-24.9 in adult 04/13/2015  . 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 Cardiac murmur 04/13/2015  . Hypotestosteronism 04/13/2015  . Enlargement of spleen 04/13/2015  . Thrombocytopenia ([place]) 04/13/2015  . 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 ED (erectile dysfunction) of organic  origin 03/25/2015  . 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 Knee osteoarthritis 06/22/2014  . 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 BICEPS TENDON RUPTURE, RIGHT 08/14/2010  . ROTATOR CUFF SYNDROME, RIGHT 08/31/2008  . DEGENERATIVE JOINT DISEASE 03/03/2008  . DEGENERATIVE JOINT DISEASE, LEFT KNEE 03/03/2008  . OTHER ACQUIRED DEFORMITY OF OTHER PARTS OF LIMB 03/03/2008    Jomarie Longs PT 01/25/2017, 9:01 AM  [facility] [address] [place], [address] Phone: [phone]   Fax:  [phone]  Name: ALIKA SALADIN MRN: [id number] Date of Birth: [birth year]

## 2017-01-29 ENCOUNTER — Ambulatory Visit: Payer: PPO | Admitting: Physical Therapy

## 2017-01-31 ENCOUNTER — Ambulatory Visit: Payer: PPO | Attending: Orthopedic Surgery | Admitting: Physical Therapy

## 2017-01-31 DIAGNOSIS — M7742 Metatarsalgia, left foot: Secondary | ICD-10-CM | POA: Diagnosis not present

## 2017-01-31 DIAGNOSIS — M2140 Flat foot [pes planus] (acquired), unspecified foot: Secondary | ICD-10-CM | POA: Diagnosis not present

## 2017-01-31 DIAGNOSIS — M7741 Metatarsalgia, right foot: Secondary | ICD-10-CM | POA: Diagnosis not present

## 2017-01-31 DIAGNOSIS — M25562 Pain in left knee: Secondary | ICD-10-CM | POA: Diagnosis not present

## 2017-01-31 DIAGNOSIS — M25511 Pain in right shoulder: Secondary | ICD-10-CM | POA: Insufficient documentation

## 2017-01-31 DIAGNOSIS — M5137 Other intervertebral disc degeneration, lumbosacral region: Secondary | ICD-10-CM | POA: Diagnosis not present

## 2017-01-31 DIAGNOSIS — M6281 Muscle weakness (generalized): Secondary | ICD-10-CM | POA: Insufficient documentation

## 2017-02-05 ENCOUNTER — Ambulatory Visit: Payer: PPO | Admitting: Physical Therapy

## 2017-02-05 ENCOUNTER — Encounter: Payer: Self-pay | Admitting: Physical Therapy

## 2017-02-05 DIAGNOSIS — M6281 Muscle weakness (generalized): Secondary | ICD-10-CM

## 2017-02-05 DIAGNOSIS — M25511 Pain in right shoulder: Secondary | ICD-10-CM | POA: Diagnosis not present

## 2017-02-05 NOTE — Therapy (Signed)
[facility] [address], [place], [address] Phone: [phone]   Fax:  [phone]  Physical Therapy Treatment  Patient Details  Name: Tony Luna MRN: [id number] Date of Birth: September 07, 1940 Referring Provider: Marchia Bond MD  Encounter Date: 02/05/2017      PT End of Session - 02/05/17 0918    Visit Number 5   Number of Visits 12   Date for PT Re-Evaluation 02/21/17   Authorization Type 5   Authorization Time Period 10 G code   PT Start Time 0907   PT Stop Time 0940   PT Time Calculation (min) 33 min   Activity Tolerance Patient tolerated treatment well   Behavior During Therapy [facility] for tasks assessed/performed      Past Medical History:  Diagnosis Date  . Allergy   . Arthritis   . Complication of anesthesia    :"need catheter"  . Dysrhythmia    ? something told by reg dr  unable to say what it was  . Esophageal tear   . Hypothyroidism   . Pneumonia    hx  . 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 Rotator cuff tear     Past Surgical History:  Procedure Laterality Date  . blepharaplasty    . CAUTERY OF TURBINATES    . COLONOSCOPY WITH PROPOFOL N/A 07/19/2015   Procedure: COLONOSCOPY WITH PROPOFOL;  Surgeon: [name], MD;  Location: [facility];  Service: Endoscopy;  Laterality: N/A;  . HERNIA REPAIR Left    inguinal herniorrhapy  . KNEE SURGERY     ? loose body/ chondral defect 35yrs  . ROTATOR CUFF REPAIR     left  . SPINE SURGERY    . TONSILLECTOMY    . TOTAL KNEE ARTHROPLASTY Left 06/22/2014   Procedure: TOTAL KNEE ARTHROPLASTY;  Surgeon: [name], MD;  Location: [place];  Service: Orthopedics;  Laterality: Left;    There were no vitals filed for this visit.      Subjective Assessment - 02/05/17 0911    Subjective Patient reports he is having minor symptoms in his right shoulder and is having a catch intermittently with certain movements with right  shoulder such as reaching back and out to side.    Pertinent History Patient reports injuring right shoulder while exercising in gym seated chest press in May 2018 with no immediate pain and had soreness and  had MRI with results of massive RTC tear/ left RTC repair 10-15 years ago   Limitations Lifting;Other (comment)  reaching out to side and overhead activities   Patient Stated Goals to be able to perform activties without pain, difficulty and be able to progress exercises without aggravating right shoulder pain   Currently in Pain? No/denies      Objective: Strength: right shoulder ER 4-/5 as compared to left shoulder  Treatment: Modalities:20 min: goal: muscle re education Electrical stimulation: Russian stim. 10/10 cycle applied (4) electrodes applied to right shoulder: along periscapular muscles and lower trapezius, posterior aspect right  shoulder: Teres muscles, intensity to contraction  Patient response to treatment: Patient demonstrated partial contraction with estim. Today due to exercising prior to attending therapy session today. Patient verbalizes good understanding of modifying exercises as indicated to not exacerbate pain/symptoms in right shoulder.           PT Education - 02/05/17 0912    Education provided Yes   Education Details discussed exercises and posture awareness with functional  activties   Person(s) Educated Patient   Methods Explanation   Comprehension Verbalized understanding             PT Long Term Goals - 01/10/17 1200      PT LONG TERM GOAL #1   Title improve Quickdash score to <15% demonstrating improved functional use right UE with less difficulty and pain   Baseline 30%   Status New   Target Date 02/21/17     PT LONG TERM GOAL #2   Title indpendent with home program for flexibiltiy, strengthening in order to be able to transition to self managment once discharged from physical therapy   Baseline limited knowledge of precautions,  progression of exercises without assistance, guidance   Status New   Target Date 02/21/17               Plan - 02/05/17 0915    Clinical Impression Statement Patient continues to demonstrate progress with independent exercises. He is responding favorably to estim. And exercising in gym with good results. He requires guidance for appropriate exercises.    Rehab Potential Good   Clinical Impairments Affecting Rehab Potential (+)motivated, prior level of function(-)massive tear without surgery right RTC   PT Frequency 2x / week   PT Duration 6 weeks   PT Treatment/Interventions Patient/family education;Electrical Stimulation;Moist Heat;Neuromuscular re-education;Therapeutic exercise;Manual techniques   PT Next Visit Plan modalities for neuromuscular re-education, therapeutic exercises   PT Home Exercise Plan scapular control, retraction exercises, precautions to avoid stress on shoulder      Patient will benefit from skilled therapeutic intervention in order to improve the following deficits and impairments:  Decreased strength, Impaired perceived functional ability, Decreased activity tolerance, Impaired UE functional use  Visit Diagnosis: Muscle weakness (generalized)  Acute pain of right shoulder     Problem List Patient Active Problem List   Diagnosis Date Noted  . Hand injury, left, initial encounter 10/23/2016  . Osteoporosis 03/29/2016  . Stress fracture of metatarsal due to multiple or repetitive stress 01/08/2016  . White coat syndrome with high blood pressure without hypertension 12/15/2015  . Tubular adenoma of colon   . Arthritis 04/13/2015  . Body mass index (BMI) of 24.0-24.9 in adult 04/13/2015  . 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 Cardiac murmur 04/13/2015  . Hypotestosteronism 04/13/2015  . Enlargement of spleen 04/13/2015  . Thrombocytopenia ([place]) 04/13/2015  . Benign prostatic hyperplasia with urinary obstruction  03/25/2015  . 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 Knee osteoarthritis 06/22/2014  . 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 BICEPS TENDON RUPTURE, RIGHT 08/14/2010  . ROTATOR CUFF SYNDROME, RIGHT 08/31/2008  . DEGENERATIVE JOINT DISEASE 03/03/2008  . DEGENERATIVE JOINT DISEASE, LEFT KNEE 03/03/2008  . OTHER ACQUIRED DEFORMITY OF OTHER PARTS OF LIMB 03/03/2008    Jomarie Longs PT 02/06/2017, 1:17 PM  [facility] [address] [place], [address] Phone: [phone]   Fax:  [phone]  Name: Tony Luna MRN: [id number] Date of Birth: Jun 09, 1940

## 2017-02-07 ENCOUNTER — Encounter: Payer: Self-pay | Admitting: Physical Therapy

## 2017-02-07 ENCOUNTER — Ambulatory Visit: Payer: PPO | Admitting: Physical Therapy

## 2017-02-07 DIAGNOSIS — M6281 Muscle weakness (generalized): Secondary | ICD-10-CM

## 2017-02-07 DIAGNOSIS — M25511 Pain in right shoulder: Secondary | ICD-10-CM

## 2017-02-07 NOTE — Therapy (Signed)
[place] [address] [place], [address] Phone: [phone]   Fax:  [phone]  Physical Therapy Treatment  Patient Details  Name: Tony Luna MRN: [id number] Date of Birth: [birth year] Referring Provider: Marchia Bond MD  Encounter Date: 02/07/2017      PT End of Session - 02/07/17 1028    Visit Number 6   Number of Visits 12   Date for PT Re-Evaluation 02/21/17   Authorization Type 6   Authorization Time Period 10 G code   PT Start Time 0907   PT Stop Time 0940   PT Time Calculation (min) 33 min   Activity Tolerance Patient tolerated treatment well   Behavior During Therapy [facility] for tasks assessed/performed      Past Medical History:  Diagnosis Date  . Allergy   . Arthritis   . Complication of anesthesia    :"need catheter"  . Dysrhythmia    ? something told by reg dr  unable to say what it was  . Esophageal tear   . Hypothyroidism   . Pneumonia    hx  . 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 Rotator cuff tear     Past Surgical History:  Procedure Laterality Date  . blepharaplasty    . CAUTERY OF TURBINATES    . COLONOSCOPY WITH PROPOFOL N/A 07/19/2015   Procedure: COLONOSCOPY WITH PROPOFOL;  Surgeon: [name], MD;  Location: [facility];  Service: Endoscopy;  Laterality: N/A;  . HERNIA REPAIR Left    inguinal herniorrhapy  . KNEE SURGERY     ? loose body/ chondral defect 31yrs  . ROTATOR CUFF REPAIR     left  . SPINE SURGERY    . TONSILLECTOMY    . TOTAL KNEE ARTHROPLASTY Left 06/22/2014   Procedure: TOTAL KNEE ARTHROPLASTY;  Surgeon: [name], MD;  Location: [place] [facility];  Service: Orthopedics;  Laterality: Left;    There were no vitals filed for this visit.      Subjective Assessment - 02/07/17 0914    Subjective patient requesting to modify home program of exercises and guidance for what exercises to do on what days. Overall he is feeling better and  stronger.    Pertinent History Patient reports injuring right shoulder while exercising in gym seated chest press in May 2018 with no immediate pain and had soreness and  had MRI with results of massive RTC tear/ left RTC repair 10-15 years ago   Limitations Lifting;Other (comment)  reaching out to side and overhead activities   Patient Stated Goals to be able to perform activties without pain, difficulty and be able to progress exercises without aggravating right shoulder pain   Currently in Pain? No/denies        Objective:   Treatment: Modalities:20 min: goal: muscle re education Electrical stimulation: Russian stim. 10/10 cycle applied (4) electrodes applied to right shoulder: along periscapular muscles and lower trapezius, posterior aspect right  shoulder: Teres muscles, intensity to contraction  Therapeutic exercise: instruction and demonstration of exercises by therapist with verbal cues and handouts given: goal: strength, independent with home program Prone lying shoulder row with 3# weight demonstration 5 reps Prone shoulder extension with 2# weight x 5 reps Prone horizontal abduction without weight x 5 reps Lat pull downs in front Side lying ER with 2-3# weight with towel roll support   Patient response to treatment: Patient demonstrated good understanding of exercises and modifcation to avoid excessive strain  on shoulder.  Full contraction of muscles with estim. No adverse reactions noted         PT Education - 02/07/17 1027    Education provided Yes   Education Details HEP with instruction, demonstration   Person(s) Educated Patient   Methods Explanation;Demonstration;Verbal cues;Handout   Comprehension Verbalized understanding             PT Long Term Goals - 01/10/17 1200      PT LONG TERM GOAL #1   Title improve Quickdash score to <15% demonstrating improved functional use right UE with less difficulty and pain   Baseline 30%   Status New   Target  Date 02/21/17     PT LONG TERM GOAL #2   Title indpendent with home program for flexibiltiy, strengthening in order to be able to transition to self managment once discharged from physical therapy   Baseline limited knowledge of precautions, progression of exercises without assistance, guidance   Status New   Target Date 02/21/17               Plan - 02/07/17 1029    Clinical Impression Statement Patient demonstrates improving strength and knowledge of exercises and progression and modifications to allow rest between sessions in order to further strengthen. He is responding well to electrical stimulation for muscle re education. Steady progress and continues to require electrical stimulation due to massive RTC tear without surgery.    Rehab Potential Good   Clinical Impairments Affecting Rehab Potential (+)motivated, prior level of function(-)massive tear without surgery right RTC   PT Frequency 2x / week   PT Duration 6 weeks   PT Treatment/Interventions Patient/family education;Electrical Stimulation;Moist Heat;Neuromuscular re-education;Therapeutic exercise;Manual techniques   PT Next Visit Plan modalities for neuromuscular re-education, therapeutic exercises   PT Home Exercise Plan scapular control, retraction exercises, precautions to avoid stress on shoulder; shoulder, chest and back exercises      Patient will benefit from skilled therapeutic intervention in order to improve the following deficits and impairments:  Decreased strength, Impaired perceived functional ability, Decreased activity tolerance, Impaired UE functional use  Visit Diagnosis: Muscle weakness (generalized)  Acute pain of right shoulder     Problem List Patient Active Problem List   Diagnosis Date Noted  . Hand injury, left, initial encounter 10/23/2016  . Osteoporosis 03/29/2016  . Stress fracture of metatarsal due to multiple or repetitive stress 01/08/2016  . White coat syndrome with high  blood pressure without hypertension 12/15/2015  . Tubular adenoma of colon   . Arthritis 04/13/2015  . Body mass index (BMI) of 24.0-24.9 in adult 04/13/2015  . 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 Cardiac murmur 04/13/2015  . Hypotestosteronism 04/13/2015  . Enlargement of spleen 04/13/2015  . Thrombocytopenia ([place]) 04/13/2015  . 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 Knee osteoarthritis 06/22/2014  . 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 BICEPS TENDON RUPTURE, RIGHT 08/14/2010  . ROTATOR CUFF SYNDROME, RIGHT 08/31/2008  . DEGENERATIVE JOINT DISEASE 03/03/2008  . DEGENERATIVE JOINT DISEASE, LEFT KNEE 03/03/2008  . OTHER ACQUIRED DEFORMITY OF OTHER PARTS OF LIMB 03/03/2008    Jomarie Longs PT 02/07/2017, 10:32 AM  [place] [address], [place], [address] Phone: [phone]   Fax:  [phone]  Name: Tony Luna MRN: [id number] Date of Birth:  11/20/1940   

## 2017-02-12 ENCOUNTER — Encounter: Payer: Self-pay | Admitting: Physical Therapy

## 2017-02-12 ENCOUNTER — Ambulatory Visit: Payer: PPO | Admitting: Physical Therapy

## 2017-02-12 DIAGNOSIS — M25511 Pain in right shoulder: Secondary | ICD-10-CM

## 2017-02-12 DIAGNOSIS — M6281 Muscle weakness (generalized): Secondary | ICD-10-CM | POA: Diagnosis not present

## 2017-02-12 NOTE — Therapy (Signed)
[facility] [facility] [address], [place], [address] Phone: [phone]   Fax:  [phone]  Physical Therapy Treatment  Patient Details  Name: Tony Luna MRN: [id number] Date of Birth: [birth year] Referring Provider: Marchia Bond MD  Encounter Date: 02/12/2017      PT End of Session - 02/12/17 0921    Visit Number 7   Number of Visits 12   Date for PT Re-Evaluation 02/21/17   Authorization Type 7   Authorization Time Period 10 G code   PT Start Time 0907   PT Stop Time 0940   PT Time Calculation (min) 33 min   Activity Tolerance Patient tolerated treatment well   Behavior During Therapy [facility] for tasks assessed/performed      Past Medical History:  Diagnosis Date  . Allergy   . Arthritis   . Complication of anesthesia    :"need catheter"  . Dysrhythmia    ? something told by reg dr  unable to say what it was  . Esophageal tear   . Hypothyroidism   . Pneumonia    hx  . 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 Rotator cuff tear     Past Surgical History:  Procedure Laterality Date  . blepharaplasty    . CAUTERY OF TURBINATES    . COLONOSCOPY WITH PROPOFOL N/A 07/19/2015   Procedure: COLONOSCOPY WITH PROPOFOL;  Surgeon: [name], MD;  Location: [facility];  Service: Endoscopy;  Laterality: N/A;  . HERNIA REPAIR Left    inguinal herniorrhapy  . KNEE SURGERY     ? loose body/ chondral defect 5yrs  . ROTATOR CUFF REPAIR     left  . SPINE SURGERY    . TONSILLECTOMY    . TOTAL KNEE ARTHROPLASTY Left 06/22/2014   Procedure: TOTAL KNEE ARTHROPLASTY;  Surgeon: [name], MD;  Location: [place];  Service: Orthopedics;  Laterality: Left;    There were no vitals filed for this visit.      Subjective Assessment - 02/12/17 0915    Subjective Patient reports he is doing well and feels the electrical stimulation is allowing him to exercise with less pain/difficulty. He is modifying  his exercise program and this also seems to be helping. He is exercising 3x/week in the gym and on alternate days he exercises at home with resistive bands and prone/side lying exercises.    Pertinent History Patient reports injuring right shoulder while exercising in gym seated chest press in May 2018 with no immediate pain and had soreness and  had MRI with results of massive RTC tear/ left RTC repair 10-15 years ago   Limitations Lifting;Other (comment)  reaching out to side and overhead activities   Patient Stated Goals to be able to perform activties without pain, difficulty and be able to progress exercises without aggravating right shoulder pain   Currently in Pain? No/denies        Objective: Posture: WNL with shoulders equal height Observation: atrophy present right shoulder infraspinatus and upper trapezius/supraspinatus region Strength: decreased strength right shoulder ER and abduction as compared to left side  Treatment: Modalities:20 min: goal: muscle re education Electrical stimulation: Russian stim. 10/10 cycle applied (4) electrodes applied to right shoulder: along periscapular muscles and lower trapezius, posterior aspect right  shoulder: Teres muscles, intensity to contraction  Therapeutic exercise: instruction and demonstration of exercises by therapist with verbal cues and handouts given: goal: strength, independent with home program Re assessed verbally  all exercises while patient was receiving estim. Added upper trapezius stretch with handout given  Patient response to treatment: Improved muscle bulk and contraction with estim. Patient verbalized good understanding of home exercises and modifications as needed.            PT Education - 02/12/17 0920    Education provided Yes   Education Details re assessed home program and resistive bands given for home   Person(s) Educated Patient   Methods Explanation;Demonstration   Comprehension Verbalized  understanding             PT Long Term Goals - 02/12/17 0941      PT LONG TERM GOAL #1   Title improve Quickdash score to <15% demonstrating improved functional use right UE with less difficulty and pain   Baseline 30%   Status On-going   Target Date 02/21/17     PT LONG TERM GOAL #2   Title indpendent with home program for flexibiltiy, strengthening in order to be able to transition to self managment once discharged from physical therapy   Baseline limited knowledge of precautions, progression of exercises without assistance, guidance   Status On-going   Target Date 02/21/17               Plan - 02/12/17 2355    Clinical Impression Statement Patient demonstrates improvement with knowledge of exercises and modification to avoid excessive stress on right shoulder with exercises. He continues to benefit from electrical stimulation to assist with strength/motor control as he heals from RTC tears.    Rehab Potential Good   Clinical Impairments Affecting Rehab Potential (+)motivated, prior level of function(-)massive tear without surgery right RTC   PT Frequency 2x / week   PT Duration 6 weeks   PT Treatment/Interventions Patient/family education;Electrical Stimulation;Moist Heat;Neuromuscular re-education;Therapeutic exercise;Manual techniques   PT Next Visit Plan modalities for neuromuscular re-education, therapeutic exercises   PT Home Exercise Plan scapular control, retraction exercises, precautions to avoid stress on shoulder; shoulder, chest and back exercises      Patient will benefit from skilled therapeutic intervention in order to improve the following deficits and impairments:  Decreased strength, Impaired perceived functional ability, Decreased activity tolerance, Impaired UE functional use  Visit Diagnosis: Muscle weakness (generalized)  Acute pain of right shoulder     Problem List Patient Active Problem List   Diagnosis Date Noted  . Hand injury,  left, initial encounter 10/23/2016  . Osteoporosis 03/29/2016  . Stress fracture of metatarsal due to multiple or repetitive stress 01/08/2016  . White coat syndrome with high blood pressure without hypertension 12/15/2015  . Tubular adenoma of colon   . Arthritis 04/13/2015  . Body mass index (BMI) of 24.0-24.9 in adult 04/13/2015  . 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 Cardiac murmur 04/13/2015  . Hypotestosteronism 04/13/2015  . Enlargement of spleen 04/13/2015  . Thrombocytopenia ([place]) 04/13/2015  . 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 Knee osteoarthritis 06/22/2014  . 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 BICEPS TENDON RUPTURE, RIGHT 08/14/2010  . ROTATOR CUFF SYNDROME, RIGHT 08/31/2008  . DEGENERATIVE JOINT DISEASE 03/03/2008  . DEGENERATIVE JOINT DISEASE, LEFT KNEE 03/03/2008  . OTHER ACQUIRED DEFORMITY OF OTHER PARTS OF LIMB 03/03/2008    Tony Luna PT 02/12/2017, 3:36 PM  [facility] [facility] [address]  [place], [place], [address] Phone: [phone]   Fax:  [phone]  Name: Tony Luna MRN: [id number] Date of Birth: [birth year]

## 2017-02-14 ENCOUNTER — Ambulatory Visit: Payer: PPO | Admitting: Physical Therapy

## 2017-02-14 ENCOUNTER — Encounter: Payer: Self-pay | Admitting: Physical Therapy

## 2017-02-14 DIAGNOSIS — M25511 Pain in right shoulder: Secondary | ICD-10-CM

## 2017-02-14 DIAGNOSIS — M6281 Muscle weakness (generalized): Secondary | ICD-10-CM

## 2017-02-14 NOTE — Therapy (Signed)
[facility] [address], [place], [address] Phone: [phone]   Fax:  [phone]  Physical Therapy Treatment  Patient Details  Name: Tony Luna MRN: [id number] Date of Birth: [birth year] Referring Provider: Marchia Bond MD  Encounter Date: 02/14/2017      PT End of Session - 02/14/17 0916    Visit Number 8   Number of Visits 12   Date for PT Re-Evaluation 02/21/17   Authorization Type 8   Authorization Time Period 10 G code   PT Start Time 0904   PT Stop Time 0933   PT Time Calculation (min) 29 min   Activity Tolerance Patient tolerated treatment well   Behavior During Therapy [facility] for tasks assessed/performed      Past Medical History:  Diagnosis Date  . Allergy   . Arthritis   . Complication of anesthesia    :"need catheter"  . Dysrhythmia    ? something told by reg dr  unable to say what it was  . Esophageal tear   . Hypothyroidism   . Pneumonia    hx  . 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 Rotator cuff tear     Past Surgical History:  Procedure Laterality Date  . blepharaplasty    . CAUTERY OF TURBINATES    . COLONOSCOPY WITH PROPOFOL N/A 07/19/2015   Procedure: COLONOSCOPY WITH PROPOFOL;  Surgeon: [name], MD;  Location: [facility] ENDOSCOPY;  Service: Endoscopy;  Laterality: N/A;  . HERNIA REPAIR Left    inguinal herniorrhapy  . KNEE SURGERY     ? loose body/ chondral defect 25yrs  . ROTATOR CUFF REPAIR     left  . SPINE SURGERY    . TONSILLECTOMY    . TOTAL KNEE ARTHROPLASTY Left 06/22/2014   Procedure: TOTAL KNEE ARTHROPLASTY;  Surgeon: [name], MD;  Location: [facility];  Service: Orthopedics;  Laterality: Left;    There were no vitals filed for this visit.      Subjective Assessment - 02/14/17 0914    Subjective Patient reports he is doing well and feels the electrical stimulation is allowing him to exercise with less pain/difficulty. He is modifying  his exercise program and this also seems to be helping. He is exercising 3x/week in the gym and on alternate days he exercises at home with resistive bands and prone/side lying exercises.    Pertinent History Patient reports injuring right shoulder while exercising in gym seated chest press in May 2018 with no immediate pain and had soreness and  had MRI with results of massive RTC tear/ left RTC repair 10-15 years ago   Limitations Lifting;Other (comment)  reaching out to side and overhead activities   Patient Stated Goals to be able to perform activties without pain, difficulty and be able to progress exercises without aggravating right shoulder pain   Currently in Pain? No/denies        Objective: Posture: WNL with shoulders equal height Observation: atrophy present right shoulder infraspinatus and upper trapezius/supraspinatus region Strength: decreased strength right shoulder ER and abduction as compared to left side: right shoulder 4-/5; left shoulder 5/5 quickDash:  4.5% (initially was 30%)  Treatment: Modalities:20 min: goal: muscle re education Electrical stimulation: Russian stim. 10/10 cycle applied (4) electrodes applied to right shoulder: along periscapular muscles and lower trapezius, posterior aspect right  shoulder: Teres muscles, intensity to contraction   Patient response to treatment: Improved muscle bulk and full contraction of  external rotators and trapezius muscles with estim. Demonstrated good understanding of exercises to continue at gym/home.         PT Education - 02/14/17 [id number]    Education provided Yes   Education Details re assessed home exercises and continued exercises over the next few weeks   Person(s) Educated Patient   Methods Explanation   Comprehension Verbalized understanding             PT Long Term Goals - 02/12/17 0941      PT LONG TERM GOAL #1   Title improve Quickdash score to <15% demonstrating improved functional use right  UE with less difficulty and pain   Baseline 30%   Status On-going   Target Date 02/21/17     PT LONG TERM GOAL #2   Title indpendent with home program for flexibiltiy, strengthening in order to be able to transition to self managment once discharged from physical therapy   Baseline limited knowledge of precautions, progression of exercises without assistance, guidance   Status On-going   Target Date 02/21/17               Plan - 02/14/17 0917    Clinical Impression Statement patient is progressing well and able to exercise independently at local gym and home. he is going on vacation for a few weeks and will follow up when he returns to determine if he will need further rehabilitation.    Rehab Potential Good   Clinical Impairments Affecting Rehab Potential (+)motivated, prior level of function(-)massive tear without surgery right RTC   PT Frequency 2x / week   PT Duration 6 weeks   PT Treatment/Interventions Patient/family education;Electrical Stimulation;Moist Heat;Neuromuscular re-education;Therapeutic exercise;Manual techniques   PT Next Visit Plan modalities for neuromuscular re-education, therapeutic exercises   PT Home Exercise Plan scapular control, retraction exercises, precautions to avoid stress on shoulder; shoulder, chest and back exercises      Patient will benefit from skilled therapeutic intervention in order to improve the following deficits and impairments:  Decreased strength, Impaired perceived functional ability, Decreased activity tolerance, Impaired UE functional use  Visit Diagnosis: Muscle weakness (generalized)  Acute pain of right shoulder     Problem List Patient Active Problem List   Diagnosis Date Noted  . Hand injury, left, initial encounter 10/23/2016  . Osteoporosis 03/29/2016  . Stress fracture of metatarsal due to multiple or repetitive stress 01/08/2016  . White coat syndrome with high blood pressure without hypertension 12/15/2015   . Tubular adenoma of colon   . Arthritis 04/13/2015  . Body mass index (BMI) of 24.0-24.9 in adult 04/13/2015  . 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 Cardiac murmur 04/13/2015  . Hypotestosteronism 04/13/2015  . Enlargement of spleen 04/13/2015  . Thrombocytopenia ([place]) 04/13/2015  . 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 Knee osteoarthritis 06/22/2014  . 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 BICEPS TENDON RUPTURE, RIGHT 08/14/2010  . ROTATOR CUFF SYNDROME, RIGHT 08/31/2008  . DEGENERATIVE JOINT DISEASE 03/03/2008  . DEGENERATIVE JOINT DISEASE, LEFT KNEE 03/03/2008  . OTHER ACQUIRED DEFORMITY OF OTHER PARTS OF LIMB 03/03/2008    Jomarie Longs PT 02/14/2017, 9:44 AM  [facility] [address] [place], [address] Phone: [phone]   Fax:  [phone]  Name: Tony Luna MRN: [id number] Date of Birth:  01/26/1941   

## 2017-02-15 LAB — PSA: PSA: 1.2

## 2017-02-15 LAB — HEPATIC FUNCTION PANEL
ALT: 36 (ref 10–40)
AST: 38 (ref 14–40)
Alkaline Phosphatase: 99 (ref 25–125)
Bilirubin, Total: 0.8

## 2017-02-15 LAB — LIPID PANEL
Cholesterol: 166 (ref 0–200)
HDL: 69 (ref 35–70)
LDL Cholesterol: 85
Triglycerides: 59 (ref 40–160)

## 2017-02-15 LAB — BASIC METABOLIC PANEL
BUN: 22 — AB (ref 4–21)
Creatinine: 1.1 (ref 0.6–1.3)
Glucose: 104
Potassium: 4.1 (ref 3.4–5.3)
Sodium: 141 (ref 137–147)

## 2017-02-15 LAB — CBC AND DIFFERENTIAL
HCT: 43 (ref 41–53)
Hemoglobin: 14.6 (ref 13.5–17.5)
Platelets: 119 — AB (ref 150–399)
WBC: 4.3

## 2017-02-15 LAB — HEMOGLOBIN A1C: Hemoglobin A1C: 5.3

## 2017-02-15 LAB — TSH: TSH: 1.54 (ref 0.41–5.90)

## 2017-02-19 ENCOUNTER — Encounter: Payer: PPO | Admitting: Physical Therapy

## 2017-02-21 ENCOUNTER — Telehealth: Payer: Self-pay | Admitting: Internal Medicine

## 2017-02-21 ENCOUNTER — Encounter: Payer: PPO | Admitting: Physical Therapy

## 2017-02-21 NOTE — Telephone Encounter (Signed)
Pt dropped off lab results. Placed in Dr. Demetrios Isaacs colored folder upfront

## 2017-02-22 NOTE — Telephone Encounter (Signed)
Placed in yellow results folder.  °

## 2017-02-27 ENCOUNTER — Telehealth: Payer: Self-pay | Admitting: Internal Medicine

## 2017-02-27 ENCOUNTER — Other Ambulatory Visit: Payer: Self-pay

## 2017-02-27 NOTE — Telephone Encounter (Signed)
Labs have been abstracted ?

## 2017-02-27 NOTE — Telephone Encounter (Signed)
Labs reviewed.  Will discuss at visit mychart message sent.  Needs abstracting .  Hold copy after abstracting the few I need

## 2017-03-01 DIAGNOSIS — N529 Male erectile dysfunction, unspecified: Secondary | ICD-10-CM | POA: Diagnosis not present

## 2017-03-01 DIAGNOSIS — R6882 Decreased libido: Secondary | ICD-10-CM | POA: Diagnosis not present

## 2017-03-01 DIAGNOSIS — G479 Sleep disorder, unspecified: Secondary | ICD-10-CM | POA: Diagnosis not present

## 2017-03-01 DIAGNOSIS — M2550 Pain in unspecified joint: Secondary | ICD-10-CM | POA: Diagnosis not present

## 2017-03-05 ENCOUNTER — Encounter: Payer: PPO | Admitting: Physical Therapy

## 2017-03-05 ENCOUNTER — Telehealth: Payer: Self-pay | Admitting: Internal Medicine

## 2017-03-05 MED ORDER — TAMSULOSIN HCL 0.4 MG PO CAPS: ORAL_CAPSULE | ORAL | 1 refills | Status: DC

## 2017-03-05 NOTE — Telephone Encounter (Signed)
Pt called and stated that he is out of town and forgot to bring his tamsulosin (FLOMAX) 0.4 MG CAPS capsule with him. He asked that we call in 4 pills to [place] [place], [place], [place] [address] phone [phone]. He also asked to refill that prescription to his local pharmacy as he does not have much left when he gets back. Please advise, thank you!  [place], [place]

## 2017-03-05 NOTE — Telephone Encounter (Signed)
Yes, ok to do.

## 2017-03-05 NOTE — Telephone Encounter (Signed)
Is this okay to do?

## 2017-03-05 NOTE — Telephone Encounter (Signed)
Pt's wife is waiting at the pharmacy so went ahead and called in 4 pills to the drug store listed below. Also sent in a refill to his local pharmacy for when he gets back.

## 2017-03-06 NOTE — Telephone Encounter (Signed)
Called in yesterday.  

## 2017-03-12 ENCOUNTER — Encounter: Payer: Self-pay | Admitting: Physical Therapy

## 2017-03-12 ENCOUNTER — Ambulatory Visit: Payer: PPO | Attending: Orthopedic Surgery | Admitting: Physical Therapy

## 2017-03-12 DIAGNOSIS — M6281 Muscle weakness (generalized): Secondary | ICD-10-CM | POA: Diagnosis not present

## 2017-03-12 DIAGNOSIS — M25511 Pain in right shoulder: Secondary | ICD-10-CM

## 2017-03-13 NOTE — Therapy (Signed)
[facility] [address], [place], [address] Phone: [phone]   Fax:  [phone]  Physical Therapy Treatment  Patient Details  Name: Tony Luna MRN: [id number] Date of Birth: 13-Sep-1940 Referring Provider: Marchia Bond MD  Encounter Date: 03/12/2017      PT End of Session - 03/12/17 0956    Visit Number 9   Number of Visits 20   Date for PT Re-Evaluation 04/09/17   Authorization Type 9   Authorization Time Period 10 G code   PT Start Time [phone]   PT Stop Time 1020   PT Time Calculation (min) 36 min   Activity Tolerance Patient tolerated treatment well   Behavior During Therapy [facility] for tasks assessed/performed      Past Medical History:  Diagnosis Date  . Allergy   . Arthritis   . Complication of anesthesia    :"need catheter"  . Dysrhythmia    ? something told by reg dr  unable to say what it was  . Esophageal tear   . Hypothyroidism   . Pneumonia    hx  . 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 Rotator cuff tear     Past Surgical History:  Procedure Laterality Date  . blepharaplasty    . CAUTERY OF TURBINATES    . COLONOSCOPY WITH PROPOFOL N/A 07/19/2015   Procedure: COLONOSCOPY WITH PROPOFOL;  Surgeon: [name], MD;  Location: [facility] ENDOSCOPY;  Service: Endoscopy;  Laterality: N/A;  . HERNIA REPAIR Left    inguinal herniorrhapy  . KNEE SURGERY     ? loose body/ chondral defect 72yrs  . ROTATOR CUFF REPAIR     left  . SPINE SURGERY    . TONSILLECTOMY    . TOTAL KNEE ARTHROPLASTY Left 06/22/2014   Procedure: TOTAL KNEE ARTHROPLASTY;  Surgeon: [name], MD;  Location: [facility];  Service: Orthopedics;  Laterality: Left;    There were no vitals filed for this visit.      Subjective Assessment - 03/12/17 0945    Subjective Patient reports he is doing well and feels the electrical stimulation is allowing him to exercise with less pain/difficulty. He is continuing  to modify his exercise program and this also seems to be helping. He is exercising 3x/week in the gym and on alternate days he exercises at home with resistive bands and prone/side lying exercises.    Pertinent History Patient reports injuring right shoulder while exercising in gym seated chest press in May 2018 with no immediate pain and had soreness and  had MRI with results of massive RTC tear/ left RTC repair 10-15 years ago   Limitations Lifting;Other (comment)  reaching out to side and overhead activities   Patient Stated Goals to be able to perform activties without pain, difficulty and be able to progress exercises without aggravating right shoulder pain   Currently in Pain? No/denies      Objective: Posture: WNL with shoulders equal height Observation: atrophy present right shoulder infraspinatus and upper trapezius/supraspinatus region Strength: decreased strength right shoulder ER and abduction as compared to left side: right shoulder 4-/5; left shoulder 5/5 quickDash:  5% (initially was 30%)  Treatment: Modalities:20 min: goal: muscle re education Electrical stimulation: Russian stim. 10/10 cycle applied (4) electrodes applied to right shoulder: along periscapular muscles and lower trapezius, posterior aspect right  shoulder: Teres muscles, intensity to contraction with patient seated in chair with right UE supported  Re instructed in  exercises and how to modify to address weakness in right periscapular muscles to improve stabilization as he strengthens shoulder Therapist demonstrated upper trapezius stretch in sitting with patient verbalizing understanding  Patient response to treatment: Improved activation of periscapular muscles right shoulder with estim. Verbalized good understanding of exercises once verbally reviewd and re demonstration for upper trapezius stretch.        PT Education - 03/12/17 1030    Education provided Yes   Education Details re assessed  strength, discussed further POC to improve strength    Person(s) Educated Patient   Methods Explanation   Comprehension Verbalized understanding             PT Long Term Goals - 03/12/17 1103      PT LONG TERM GOAL #1   Title improve Quickdash score to <15% demonstrating improved functional use right UE with less difficulty and pain   Baseline 30%; 03/13/17 5%   Status Achieved     PT LONG TERM GOAL #2   Title indpendent with home program for flexibiltiy, strengthening in order to be able to transition to self managment once discharged from physical therapy   Baseline limited knowledge of precautions, progression of exercises without assistance, guidance   Status Revised   Target Date 04/09/17               Plan - 03/12/17 1100    Clinical Impression Statement Patient is progressing well with therapy intervention and is able to perform exercises in gym and home with guidance of therapist for modifications as indicated. He continues with weakness in left shoulder peri scapular muscles and is responding well to electrical stimulation for muscle re education. He wll benefit from further therapy intervention to achieve lastitng results and be able to transition to independent self management.    Rehab Potential Good   Clinical Impairments Affecting Rehab Potential (+)motivated, prior level of function(-)massive tear without surgery right RTC   PT Frequency Other (comment)  1-2x/week   PT Duration 4 weeks   PT Treatment/Interventions Patient/family education;Electrical Stimulation;Moist Heat;Neuromuscular re-education;Therapeutic exercise;Manual techniques   PT Next Visit Plan modalities for neuromuscular re-education, therapeutic exercises   PT Home Exercise Plan scapular control, retraction exercises, precautions to avoid stress on shoulder; shoulder, chest and back exercises   Consulted and Agree with Plan of Care Patient      Patient will benefit from skilled  therapeutic intervention in order to improve the following deficits and impairments:  Decreased strength, Impaired perceived functional ability, Decreased activity tolerance, Impaired UE functional use  Visit Diagnosis: Muscle weakness (generalized) - Plan: PT plan of care cert/re-cert  Acute pain of right shoulder - Plan: PT plan of care cert/re-cert     Problem List Patient Active Problem List   Diagnosis Date Noted  . Hand injury, left, initial encounter 10/23/2016  . Osteoporosis 03/29/2016  . Stress fracture of metatarsal due to multiple or repetitive stress 01/08/2016  . White coat syndrome with high blood pressure without hypertension 12/15/2015  . Tubular adenoma of colon   . Arthritis 04/13/2015  . Body mass index (BMI) of 24.0-24.9 in adult 04/13/2015  . 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 Cardiac murmur 04/13/2015  . Hypotestosteronism 04/13/2015  . Enlargement of spleen 04/13/2015  . Thrombocytopenia ([place]) 04/13/2015  . 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 Knee osteoarthritis 06/22/2014  .  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 BICEPS TENDON RUPTURE, RIGHT 08/14/2010  . ROTATOR CUFF SYNDROME, RIGHT 08/31/2008  . DEGENERATIVE JOINT DISEASE 03/03/2008  . DEGENERATIVE JOINT DISEASE, LEFT KNEE 03/03/2008  . OTHER ACQUIRED DEFORMITY OF OTHER PARTS OF LIMB 03/03/2008    Jomarie Longs PT 03/13/2017, 4:11 PM  [facility] [address] [place], [address] Phone: [phone]   Fax:  [phone]  Name: KEELYN FJELSTAD MRN: [id number] Date of Birth: Jan 09, 1941

## 2017-03-14 ENCOUNTER — Ambulatory Visit: Payer: PPO | Admitting: Physical Therapy

## 2017-03-14 ENCOUNTER — Encounter: Payer: Self-pay | Admitting: Physical Therapy

## 2017-03-14 DIAGNOSIS — M6281 Muscle weakness (generalized): Secondary | ICD-10-CM

## 2017-03-14 DIAGNOSIS — M25511 Pain in right shoulder: Secondary | ICD-10-CM

## 2017-03-15 NOTE — Therapy (Signed)
[facility] [address], [place], [address] Phone: [phone]   Fax:  [phone]  Physical Therapy Treatment/Progress report  Patient Details  Name: Tony Luna MRN: [id number] Date of Birth: 1940/07/12 Referring Provider: Marchia Bond MD  Encounter Date: 03/14/2017      PT End of Session - 03/14/17 0921    Visit Number 10   Number of Visits 20   Date for PT Re-Evaluation 04/09/17   Authorization Type 10   Authorization Time Period 10 G code   PT Start Time 0907   PT Stop Time 0933   PT Time Calculation (min) 26 min   Activity Tolerance Patient tolerated treatment well   Behavior During Therapy [facility] for tasks assessed/performed      Past Medical History:  Diagnosis Date  . Allergy   . Arthritis   . Complication of anesthesia    :"need catheter"  . Dysrhythmia    ? something told by reg dr  unable to say what it was  . Esophageal tear   . Hypothyroidism   . Pneumonia    hx  . 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 Rotator cuff tear     Past Surgical History:  Procedure Laterality Date  . blepharaplasty    . CAUTERY OF TURBINATES    . COLONOSCOPY WITH PROPOFOL N/A 07/19/2015   Procedure: COLONOSCOPY WITH PROPOFOL;  Surgeon: [name], MD;  Location: [facility];  Service: Endoscopy;  Laterality: N/A;  . HERNIA REPAIR Left    inguinal herniorrhapy  . KNEE SURGERY     ? loose body/ chondral defect 73yrs  . ROTATOR CUFF REPAIR     left  . SPINE SURGERY    . TONSILLECTOMY    . TOTAL KNEE ARTHROPLASTY Left 06/22/2014   Procedure: TOTAL KNEE ARTHROPLASTY;  Surgeon: [name], MD;  Location: [facility];  Service: Orthopedics;  Laterality: Left;    There were no vitals filed for this visit.      Subjective Assessment - 03/14/17 0917    Subjective Patient reports he is exercising consistently and continues to see improvement with electrical stimulation and guidance  with exercise progression and modification   Pertinent History Patient reports injuring right shoulder while exercising in gym seated chest press in May 2018 with no immediate pain and had soreness and  had MRI with results of massive RTC tear/ left RTC repair 10-15 years ago   Limitations Lifting;Other (comment)  reaching out to side and overhead activities   Patient Stated Goals to be able to perform activties without pain, difficulty and be able to progress exercises without aggravating right shoulder pain   Currently in Pain? No/denies      Objective: Posture: WNL with shoulders equal height Observation: atrophy present right shoulder infraspinatus and upper trapezius/supraspinatus region Strength: decreased strength right shoulder ER and abduction as compared to left side: right shoulder 4-/5; left shoulder 5/5  Treatment: Modalities:20 min: goal: muscle re education Electrical stimulation: Russian stim. 10/10 cycle applied (4) electrodes applied to right shoulder: along periscapular muscles and lower trapezius, posterior aspect right  shoulder: Teres muscles, intensity to contraction with patient seated in chair with right UE supported  Re assessed home program and instructed in isometric ER with resistive band for right shoulder while exercising left shoulder, patient required repeated demonstration and VC to perform correctly Performed 5x isometric ER with manual resistance of therapist with mild to moderate resistance  Patient  response to treatment: Improved technique with exercise following repeated demonstration and with VC. Improved activation of muscles right shoulder and scapula with electrical stimulation.         PT Education - 2017/04/10 0919    Education provided Yes   Education Details re assessed  strength   Person(s) Educated Patient   Methods Explanation   Comprehension Verbalized understanding             PT Long Term Goals - 03/12/17 1103      PT  LONG TERM GOAL #1   Title improve Quickdash score to <15% demonstrating improved functional use right UE with less difficulty and pain   Baseline 30%; 03/13/17 5%   Status Achieved     PT LONG TERM GOAL #2   Title indpendent with home program for flexibiltiy, strengthening in order to be able to transition to self managment once discharged from physical therapy   Baseline limited knowledge of precautions, progression of exercises without assistance, guidance   Status Revised   Target Date 04/09/17               Plan - 04-10-2017 8527    Clinical Impression Statement Patient is progressing well with therapy intervention and demonstrated improved technique with exercises for strengthening right shoulder. He requires continued therapy to appropriately progress exercises and is responding well to electrical stimulation for muscle re education right shoulder. Prolonged healing and progress due to massive RTC tear without surgery.   Rehab Potential Good   Clinical Impairments Affecting Rehab Potential (+)motivated, prior level of function(-)massive tear without surgery right RTC   PT Frequency Other (comment)  1-2x/week   PT Duration 4 weeks   PT Treatment/Interventions Patient/family education;Electrical Stimulation;Moist Heat;Neuromuscular re-education;Therapeutic exercise;Manual techniques   PT Next Visit Plan modalities for neuromuscular re-education, therapeutic exercises   PT Home Exercise Plan scapular control, retraction exercises, precautions to avoid stress on shoulder; shoulder, chest and back exercises   Consulted and Agree with Plan of Care Patient      Patient will benefit from skilled therapeutic intervention in order to improve the following deficits and impairments:  Decreased strength, Impaired perceived functional ability, Decreased activity tolerance, Impaired UE functional use  Visit Diagnosis: Muscle weakness (generalized)  Acute pain of right shoulder        G-Codes - 2017/04/10 0951    Functional Assessment Tool Used (Outpatient Only) QuickDash, ROM, strength, pain, clinical judgment   Functional Limitation Carrying, moving and handling objects   Carrying, Moving and Handling Objects Current Status (P8242) At least 20 percent but less than 40 percent impaired, limited or restricted   Carrying, Moving and Handling Objects Goal Status (P5361) At least 1 percent but less than 20 percent impaired, limited or restricted      Problem List Patient Active Problem List   Diagnosis Date Noted  . Hand injury, left, initial encounter 10/23/2016  . Osteoporosis 03/29/2016  . Stress fracture of metatarsal due to multiple or repetitive stress 01/08/2016  . White coat syndrome with high blood pressure without hypertension 12/15/2015  . Tubular adenoma of colon   . Arthritis 04/13/2015  . Body mass index (BMI) of 24.0-24.9 in adult 04/13/2015  . 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 Cardiac murmur 04/13/2015  . Hypotestosteronism 04/13/2015  . Enlargement of spleen 04/13/2015  . Thrombocytopenia ([place]) 04/13/2015  . 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 Primary osteoarthritis of  left knee 06/22/2014  . Knee osteoarthritis 06/22/2014  . 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 BICEPS TENDON RUPTURE, RIGHT 08/14/2010  . ROTATOR CUFF SYNDROME, RIGHT 08/31/2008  . DEGENERATIVE JOINT DISEASE 03/03/2008  . DEGENERATIVE JOINT DISEASE, LEFT KNEE 03/03/2008  . OTHER ACQUIRED DEFORMITY OF OTHER PARTS OF LIMB 03/03/2008    Tony Luna PT 03/15/2017, 9:53 AM  [facility] [address] [place], [address] Phone: [phone]   Fax:  [phone]  Name: Tony Luna MRN: [id number] Date of Birth: 1940-11-12

## 2017-03-18 ENCOUNTER — Ambulatory Visit (INDEPENDENT_AMBULATORY_CARE_PROVIDER_SITE_OTHER): Payer: PPO | Admitting: Urology

## 2017-03-18 ENCOUNTER — Other Ambulatory Visit: Payer: Self-pay

## 2017-03-18 ENCOUNTER — Encounter: Payer: Self-pay | Admitting: Urology

## 2017-03-18 VITALS — BP 144/63 | HR 87 | Ht 72.0 in | Wt 168.0 lb

## 2017-03-18 DIAGNOSIS — E349 Endocrine disorder, unspecified: Secondary | ICD-10-CM

## 2017-03-18 DIAGNOSIS — N401 Enlarged prostate with lower urinary tract symptoms: Secondary | ICD-10-CM | POA: Diagnosis not present

## 2017-03-18 DIAGNOSIS — N400 Enlarged prostate without lower urinary tract symptoms: Secondary | ICD-10-CM

## 2017-03-18 DIAGNOSIS — N529 Male erectile dysfunction, unspecified: Secondary | ICD-10-CM | POA: Diagnosis not present

## 2017-03-18 LAB — BLADDER SCAN AMB NON-IMAGING

## 2017-03-18 MED ORDER — TAMSULOSIN HCL 0.4 MG PO CAPS: ORAL_CAPSULE | ORAL | 3 refills | Status: DC

## 2017-03-18 NOTE — Progress Notes (Signed)
03/18/2017 9:14 AM   Tony Luna March 19, 1941 [id number]  Referring provider: [name], MD [place], [place] [address]  Chief Complaint  Patient presents with  . Benign Prostatic Hypertrophy    follow up    HPI: 76 year old male presents for follow-up of erectile dysfunction, BPH and hypogonadism.  He is presently receiving sub-q testosterone implants from a clinic in [place].  He had blood work drawn in September 2015 and testosterone level was 570 ng/dL.  His PSA was 1.2.  Hematocrit was normal.  He is using tadalafil 5 mg as needed for erectile dysfunction.  He states his ED practice in [place] has made prescribed a combination of sildenafil and tadalafil which he has not used.  He denies breast tenderness/enlargement or lower extremity edema.  He remains on tamsulosin and does note worsening lower urinary tract symptoms if he misses 2-3 doses.  He denies dysuria or gross hematuria.  Denies flank, abdominal, pelvic or scrotal pain.  PMH: Past Medical History:  Diagnosis Date  . Allergy   . Arthritis   . Complication of anesthesia    :"need catheter"  . Dysrhythmia    ? something told by reg dr  unable to say what it was  . Esophageal tear   . Hypothyroidism   . Pneumonia    hx  . 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 Rotator cuff tear     Surgical History: Past Surgical History:  Procedure Laterality Date  . blepharaplasty    . CAUTERY OF TURBINATES    . COLONOSCOPY WITH PROPOFOL N/A 07/19/2015   Procedure: COLONOSCOPY WITH PROPOFOL;  Surgeon: [name], MD;  Location: [facility];  Service: Endoscopy;  Laterality: N/A;  . HERNIA REPAIR Left    inguinal herniorrhapy  . KNEE SURGERY     ? loose body/ chondral defect 28yrs  . ROTATOR CUFF REPAIR     left  . SPINE SURGERY    . TONSILLECTOMY    . TOTAL KNEE ARTHROPLASTY Left 06/22/2014   Procedure: TOTAL KNEE ARTHROPLASTY;  Surgeon: [name], MD;   Location: [facility];  Service: Orthopedics;  Laterality: Left;    Home Medications:  Allergies as of 03/18/2017   No Known Allergies     Medication List       Accurate as of 03/18/17  9:14 AM. Always use your most recent med list.          Bitter Melon Powd Take 1 tablet by mouth daily.   meloxicam 15 MG tablet Commonly known as:  MOBIC Take 15 mg by mouth daily.   MILK THISTLE PO Take 1 tablet by mouth 2 (two) times daily.   multivitamin with minerals Tabs tablet Take 1 tablet by mouth 2 (two) times daily.   OVER THE COUNTER MEDICATION Take 3,000 mg by mouth daily. Omega Cure   OVER THE COUNTER MEDICATION Take 250 mg by mouth daily. Bio Magnesium Citrate   OVER THE COUNTER MEDICATION Take 20,000 mcg by mouth daily. Vitamin B12-methylfolate   OVER THE COUNTER MEDICATION Take 1 tablet by mouth daily. Pure adrenal 400   OVER THE COUNTER MEDICATION Take 800 mcg by mouth daily. Active B Folate   OVER THE COUNTER MEDICATION Inject 50 Units as directed daily. Semorelin   OVER THE COUNTER MEDICATION 2 tablets daily. VitaPrime   OVER THE COUNTER MEDICATION Take 4 sprays by mouth at bedtime. Secretropin oral spray - growth hormone   OVER THE COUNTER MEDICATION  Take 1 capsule by mouth 3 (three) times daily. Instaflex   OVER THE COUNTER MEDICATION Take 1,000 mg by mouth daily. Turmeric with Meriva   OVER THE COUNTER MEDICATION 1,000 mg daily. Super Bio-C buffered   OVER THE COUNTER MEDICATION 1,000 mg daily. L'Arginine   OVER THE COUNTER MEDICATION 2 capsules 2 (two) times daily. SLF Forte   OVER THE COUNTER MEDICATION 1,000 mg. Myomin   sildenafil 100 MG tablet Commonly known as:  VIAGRA Take 1 tablet (100 mg total) by mouth as needed for erectile dysfunction.   tadalafil 5 MG tablet Commonly known as:  CIALIS Take 5 mg by mouth.   tamsulosin 0.4 MG Caps capsule Commonly known as:  FLOMAX TAKE ONE (1) CAPSULE BY MOUTH EACH DAY FOR URINATION     TAURINE PO Take 1,000 mg by mouth daily.   TESTOPEL 75 MG Pllt Generic drug:  Testosterone by Implant route.   VITAMIN D-3 PO Take 5,000 Units by mouth daily. 5,00000 u in the morning, 2,000 u in the evening       Allergies: No Known Allergies  Family History: Family History  Problem Relation Age of Onset  . Alcohol abuse Mother   . Diabetes Mother   . Mental retardation Mother     Social History:  reports that he quit smoking about 33 years ago. His smoking use included Cigarettes. He has a 15.00 pack-year smoking history. His smokeless tobacco use includes Chew. He reports that he does not drink alcohol or use drugs.  ROS: UROLOGY Frequent Urination?: No Hard to postpone urination?: No Burning/pain with urination?: No Get up at night to urinate?: Yes Leakage of urine?: No Urine stream starts and stops?: No Trouble starting stream?: No Do you have to strain to urinate?: No Blood in urine?: No Urinary tract infection?: No Sexually transmitted disease?: No Injury to kidneys or bladder?: No Painful intercourse?: No Weak stream?: No Erection problems?: No Penile pain?: No  Gastrointestinal Nausea?: No Vomiting?: No Indigestion/heartburn?: No Diarrhea?: No Constipation?: No  Constitutional Fever: No Night sweats?: No Weight loss?: No Fatigue?: No  Skin Skin rash/lesions?: No Itching?: No  Eyes Blurred vision?: No Double vision?: No  Ears/Nose/Throat Sore throat?: No Sinus problems?: No  Hematologic/Lymphatic Swollen glands?: No Easy bruising?: No  Cardiovascular Leg swelling?: No Chest pain?: No  Respiratory Cough?: No Shortness of breath?: No  Endocrine Excessive thirst?: No  Musculoskeletal Back pain?: No Joint pain?: Yes  Neurological Headaches?: No Dizziness?: No  Psychologic Depression?: No Anxiety?: Yes  Physical Exam: BP (!) 144/63   Pulse 87   Ht 6' (1.829 m)   Wt 168 lb (76.2 kg)   BMI 22.78 kg/m    Constitutional:  Alert and oriented, No acute distress. HEENT: [place] AT, moist mucus membranes.  Trachea midline, no masses. Cardiovascular: No clubbing, cyanosis, or edema. Respiratory: Normal respiratory effort, no increased work of breathing. GI: Abdomen is soft, nontender, nondistended, no abdominal masses GU: No CVA tenderness.  Prostate 50 g, smooth without nodules Skin: No rashes, bruises or suspicious lesions. Lymph: No cervical or inguinal adenopathy. Neurologic: Grossly intact, no focal deficits, moving all 4 extremities. Psychiatric: Normal mood and affect.  Laboratory Data: Lab Results  Component Value Date   WBC 4.3 02/15/2017   HGB 14.6 02/15/2017   HCT 43 02/15/2017   MCV 85.9 06/24/2014   PLT 119 (A) 02/15/2017    Lab Results  Component Value Date   CREATININE 1.1 02/15/2017    No results found for: PSA1  No results found for: TESTOSTERONE  Lab Results  Component Value Date   HGBA1C 5.3 02/15/2017     Assessment & Plan:   1. Benign prostatic hyperplasia, unspecified whether lower urinary tract symptoms present Stable lower urinary tract symptoms on tamsulosin.  PVR by bladder scan today was 1 mL.  Tamsulosin was refilled.  Continue annual follow-up.  - BLADDER SCAN AMB NON-IMAGING  2. ED (erectile dysfunction) of organic origin He will continue follow-up with his ED practice in [place]  4. Hypotestosteronism He will continue follow-up with his ED practice in [place]   Return in about 1 year (around 03/18/2018).     Abbie Sons, [facility] [address] [place], [place] [address] [phone]

## 2017-03-19 ENCOUNTER — Ambulatory Visit: Payer: PPO | Admitting: Physical Therapy

## 2017-03-21 ENCOUNTER — Ambulatory Visit: Payer: PPO | Admitting: Physical Therapy

## 2017-03-21 DIAGNOSIS — Z85828 Personal history of other malignant neoplasm of skin: Secondary | ICD-10-CM | POA: Diagnosis not present

## 2017-03-21 DIAGNOSIS — X32XXXA Exposure to sunlight, initial encounter: Secondary | ICD-10-CM | POA: Diagnosis not present

## 2017-03-21 DIAGNOSIS — L820 Inflamed seborrheic keratosis: Secondary | ICD-10-CM | POA: Diagnosis not present

## 2017-03-21 DIAGNOSIS — L570 Actinic keratosis: Secondary | ICD-10-CM | POA: Diagnosis not present

## 2017-03-21 DIAGNOSIS — D1801 Hemangioma of skin and subcutaneous tissue: Secondary | ICD-10-CM | POA: Diagnosis not present

## 2017-03-21 DIAGNOSIS — Z08 Encounter for follow-up examination after completed treatment for malignant neoplasm: Secondary | ICD-10-CM | POA: Diagnosis not present

## 2017-03-21 DIAGNOSIS — L538 Other specified erythematous conditions: Secondary | ICD-10-CM | POA: Diagnosis not present

## 2017-03-21 DIAGNOSIS — L821 Other seborrheic keratosis: Secondary | ICD-10-CM | POA: Diagnosis not present

## 2017-03-26 ENCOUNTER — Encounter: Payer: PPO | Admitting: Physical Therapy

## 2017-03-28 ENCOUNTER — Encounter: Payer: Self-pay | Admitting: Physical Therapy

## 2017-03-28 ENCOUNTER — Ambulatory Visit: Payer: PPO | Attending: Orthopedic Surgery | Admitting: Physical Therapy

## 2017-03-28 DIAGNOSIS — M62838 Other muscle spasm: Secondary | ICD-10-CM | POA: Insufficient documentation

## 2017-03-28 DIAGNOSIS — M6281 Muscle weakness (generalized): Secondary | ICD-10-CM

## 2017-03-28 NOTE — Therapy (Signed)
[facility] [address] [place], [address] Phone: [phone]   Fax:  [phone]  Physical Therapy Treatment  Patient Details  Name: Tony Luna MRN: [id number] Date of Birth: Dec 11, 1940 Referring Provider: Marchia Bond MD  Encounter Date: 03/28/2017      PT End of Session - 03/28/17 0954    Visit Number 11   Number of Visits 20   Date for PT Re-Evaluation 04/09/17   Authorization Type 11   Authorization Time Period 20 G code   PT Start Time 0912   PT Stop Time 0950   PT Time Calculation (min) 38 min   Activity Tolerance Patient tolerated treatment well   Behavior During Therapy [facility] for tasks assessed/performed      Past Medical History:  Diagnosis Date  . Allergy   . Arthritis   . Complication of anesthesia    :"need catheter"  . Dysrhythmia    ? something told by reg dr  unable to say what it was  . Esophageal tear   . Hypothyroidism   . Pneumonia    hx  . 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 Rotator cuff tear     Past Surgical History:  Procedure Laterality Date  . blepharaplasty    . CAUTERY OF TURBINATES    . COLONOSCOPY WITH PROPOFOL N/A 07/19/2015   Procedure: COLONOSCOPY WITH PROPOFOL;  Surgeon: [name], MD;  Location: [facility];  Service: Endoscopy;  Laterality: N/A;  . HERNIA REPAIR Left    inguinal herniorrhapy  . KNEE SURGERY     ? loose body/ chondral defect 42yrs  . ROTATOR CUFF REPAIR     left  . SPINE SURGERY    . TONSILLECTOMY    . TOTAL KNEE ARTHROPLASTY Left 06/22/2014   Procedure: TOTAL KNEE ARTHROPLASTY;  Surgeon: [name], MD;  Location: [facility];  Service: Orthopedics;  Laterality: Left;    There were no vitals filed for this visit.      Subjective Assessment - 03/28/17 0914    Subjective Patient reports he is exercising consistently and is noticing how to modify intensity. He would like to go over prone lying exercise  technique and intensity.   Pertinent History Patient reports injuring right shoulder while exercising in gym seated chest press in May 2018 with no immediate pain and had soreness and  had MRI with results of massive RTC tear/ left RTC repair 10-15 years ago   Limitations Lifting;Other (comment)  reaching out to side and overhead activities   Patient Stated Goals to be able to perform activties without pain, difficulty and be able to progress exercises without aggravating right shoulder pain   Currently in Pain? No/denies        Objective: Posture: WNL with shoulders equal height Observation: right shoulder with improved muscle bulk noted posterior aspect infraspinatus Strength: decreased strength right shoulder ER and abduction as compared to left side: right shoulder 4-/5; left shoulder 5/5 with significant improvement noted Palpation: moderate spasms palpable along right upper trapezius, posterior aspect right shoulder and along medial border of scapula right   Treatment: Modalities:20 min: goal: muscle re education Electrical stimulation: Russian stim. 10/10 cycle applied (4) electrodes applied to right shoulder: along periscapular muscles and lower trapezius, posterior aspect right  shoulder: Teres muscles, intensity to contraction with patient seated in chair with right UE supported  Manual therapy: 12 min. : goal: decrease spasms, improve soft tissue elasticity STM performed  with patient seated in chair with UE supported: superficial and compression techniques to muscle spasms along posterior aspect of right shoulder, upper trapezius and medial border of scapula  Patient response to treatment: Decreased spasms and tenderness to mild with STM and full activation of right periscapular and rotator cuff muscles with estim.          PT Education - 03/28/17 0930    Education provided Yes   Education Details re assessed HEP and modification of exercises; prone horizontal  abduction and ER exercises with light weight    Person(s) Educated Patient   Methods Explanation;Demonstration   Comprehension Verbalized understanding             PT Long Term Goals - 03/12/17 1103      PT LONG TERM GOAL #1   Title improve Quickdash score to <15% demonstrating improved functional use right UE with less difficulty and pain   Baseline 30%; 03/13/17 5%   Status Achieved     PT LONG TERM GOAL #2   Title indpendent with home program for flexibiltiy, strengthening in order to be able to transition to self managment once discharged from physical therapy   Baseline limited knowledge of precautions, progression of exercises without assistance, guidance   Status Revised   Target Date 04/09/17               Plan - 03/28/17 0955    Clinical Impression Statement Patient is progressing well with improvement noted in right shoulder strengtgh external rotation and rhomboids/lower trapezius muscles. He is learning how to modify exercises to continue progress with strength.    Rehab Potential Good   Clinical Impairments Affecting Rehab Potential (+)motivated, prior level of function(-)massive tear without surgery right RTC   PT Frequency Other (comment)  1-2x/week   PT Duration 4 weeks   PT Treatment/Interventions Patient/family education;Electrical Stimulation;Moist Heat;Neuromuscular re-education;Therapeutic exercise;Manual techniques   PT Next Visit Plan modalities for neuromuscular re-education, therapeutic exercises; manual STM; re assess QuickDash, strength and anticipate discharge to independent self management   PT Home Exercise Plan scapular control, retraction exercises, precautions to avoid stress on shoulder; shoulder, chest and back exercises      Patient will benefit from skilled therapeutic intervention in order to improve the following deficits and impairments:  Decreased strength, Impaired perceived functional ability, Decreased activity tolerance,  Impaired UE functional use  Visit Diagnosis: Muscle weakness (generalized)     Problem List Patient Active Problem List   Diagnosis Date Noted  . Hand injury, left, initial encounter 10/23/2016  . Osteoporosis 03/29/2016  . Stress fracture of metatarsal due to multiple or repetitive stress 01/08/2016  . White coat syndrome with high blood pressure without hypertension 12/15/2015  . Tubular adenoma of colon   . Arthritis 04/13/2015  . Body mass index (BMI) of 24.0-24.9 in adult 04/13/2015  . Cirrhosis ([place]) 04/13/2015  . 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 Cardiac murmur 04/13/2015  . Hypotestosteronism 04/13/2015  . Enlargement of spleen 04/13/2015  . Thrombocytopenia ([place]) 04/13/2015  . 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 Knee osteoarthritis 06/22/2014  . 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 BICEPS TENDON RUPTURE, RIGHT 08/14/2010  . ROTATOR CUFF SYNDROME, RIGHT 08/31/2008  . DEGENERATIVE JOINT DISEASE 03/03/2008  . DEGENERATIVE JOINT DISEASE, LEFT KNEE 03/03/2008  . OTHER ACQUIRED DEFORMITY OF OTHER PARTS OF LIMB 03/03/2008    Jomarie Longs  PT 03/28/2017, 1:41 PM  [facility] [address] [place], [address] Phone: [phone]   Fax:  [phone]  Name: Tony Luna MRN: [id number] Date of Birth: 1941-02-16

## 2017-04-02 ENCOUNTER — Encounter: Payer: PPO | Admitting: Physical Therapy

## 2017-04-03 DIAGNOSIS — M7742 Metatarsalgia, left foot: Secondary | ICD-10-CM | POA: Diagnosis not present

## 2017-04-03 DIAGNOSIS — M2140 Flat foot [pes planus] (acquired), unspecified foot: Secondary | ICD-10-CM | POA: Diagnosis not present

## 2017-04-03 DIAGNOSIS — M25562 Pain in left knee: Secondary | ICD-10-CM | POA: Diagnosis not present

## 2017-04-03 DIAGNOSIS — M7741 Metatarsalgia, right foot: Secondary | ICD-10-CM | POA: Diagnosis not present

## 2017-04-03 DIAGNOSIS — M5137 Other intervertebral disc degeneration, lumbosacral region: Secondary | ICD-10-CM | POA: Diagnosis not present

## 2017-04-04 ENCOUNTER — Ambulatory Visit: Payer: PPO | Admitting: Physical Therapy

## 2017-04-09 ENCOUNTER — Ambulatory Visit: Payer: PPO | Admitting: Physical Therapy

## 2017-04-16 ENCOUNTER — Ambulatory Visit: Payer: PPO | Admitting: Physical Therapy

## 2017-04-16 ENCOUNTER — Encounter: Payer: Self-pay | Admitting: Physical Therapy

## 2017-04-16 ENCOUNTER — Other Ambulatory Visit: Payer: Self-pay

## 2017-04-16 DIAGNOSIS — M6281 Muscle weakness (generalized): Secondary | ICD-10-CM

## 2017-04-16 DIAGNOSIS — M62838 Other muscle spasm: Secondary | ICD-10-CM

## 2017-04-16 NOTE — Therapy (Signed)
[facility] [address] [place], [address] Phone: [phone]   Fax:  [phone]  Physical Therapy Treatment  Patient Details  Name: Tony Luna MRN: [id number] Date of Birth: 06/06/1940 Referring Provider: Marchia Bond MD   Encounter Date: 04/16/2017  PT End of Session - 04/16/17 1100    Visit Number  12    Number of Visits  24    Date for PT Re-Evaluation  05/28/17    Authorization Type  12    Authorization Time Period  20 G code    PT Start Time  0904    PT Stop Time  0940    PT Time Calculation (min)  36 min    Activity Tolerance  Patient tolerated treatment well    Behavior During Therapy  [facility] for tasks assessed/performed       Past Medical History:  Diagnosis Date  . Allergy   . Arthritis   . Complication of anesthesia    :"need catheter"  . Dysrhythmia    ? something told by reg dr  unable to say what it was  . Esophageal tear   . Hypothyroidism   . Pneumonia    hx  . 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 Rotator cuff tear     Past Surgical History:  Procedure Laterality Date  . blepharaplasty    . CAUTERY OF TURBINATES    . COLONOSCOPY WITH PROPOFOL N/A 07/19/2015   Procedure: COLONOSCOPY WITH PROPOFOL;  Surgeon: [name], MD;  Location: [facility];  Service: Endoscopy;  Laterality: N/A;  . HERNIA REPAIR Left    inguinal herniorrhapy  . KNEE SURGERY     ? loose body/ chondral defect 57yrs  . ROTATOR CUFF REPAIR     left  . SPINE SURGERY    . TONSILLECTOMY    . TOTAL KNEE ARTHROPLASTY Left 06/22/2014   Procedure: TOTAL KNEE ARTHROPLASTY;  Surgeon: [name], MD;  Location: [facility];  Service: Orthopedics;  Laterality: Left;    There were no vitals filed for this visit.  Subjective Assessment - 04/16/17 0904    Subjective  Patient reports he is exercising and was able to go fishing and use a pole without a problem. He feels he is still needing  guidance for appropriate exercise progression as he becomes more active.    Pertinent History  Patient reports injuring right shoulder while exercising in gym seated chest press in May 2018 with no immediate pain and had soreness and  had MRI with results of massive RTC tear/ left RTC repair 10-15 years ago    Limitations  Lifting;Other (comment) reaching out to side and overhead activities    Patient Stated Goals  to be able to perform activties without pain, difficulty and be able to progress exercises without aggravating right shoulder pain    Currently in Pain?  No/denies       Objective: Posture: right shoulder elevated with muscle spasm palpable upper trapezius.  Strength: decreased strength right shoulder ER and abduction as compared to left side: right shoulder 4-/5; left shoulder 5/5 with significant improvement noted Palpation: moderate spasms palpable along right upper trapezius, posterior aspect right shoulder   Treatment: Modalities:20 min: goal: muscle re education Electrical stimulation: Russian stim. 10/10 cycle applied (4) electrodes applied to right shoulder: along periscapular muscles and lower trapezius, posterior aspect right  shoulder: Teres muscles, intensity to contraction with patient seated in chair with right UE  supported  Manual therapy: 12 min. : goal: decrease spasms, improve soft tissue elasticity STM performed with patient seated in chair with UE supported: superficial and compression techniques to muscle spasms along posterior aspect of right shoulder, upper trapezius and medial border of scapula  Patient response to treatment: Decreased spasms and tenderness to mild in upper trapezius and posterior aspect of right shoulder with STM and full activation of right periscapular and rotator cuff muscles with estim      PT Education - 04/16/17 1000    Education provided  Yes    Education Details  re assessed exercises and discussed gym exercises and proper  modification and intensity to avoid exacerbation of pain    Person(s) Educated  Patient    Methods  Explanation    Comprehension  Verbalized understanding          PT Long Term Goals - 04/16/17 1200      PT LONG TERM GOAL #1   Title  improve Quickdash score to <15% demonstrating improved functional use right UE with less difficulty and pain    Baseline  30%; 03/13/17 5%    Status  Achieved      PT LONG TERM GOAL #2   Title  indpendent with home program for flexibiltiy, strengthening in order to be able to transition to self managment once discharged from physical therapy    Baseline  limited knowledge of precautions, progression of exercises without assistance, guidance    Status  Revised    Target Date  05/28/17            Plan - 04/16/17 1100    Clinical Impression Statement  Patient has not been able to attend therapy consistently over the past few weeks due to conflicts and will benefit from additional physical therapy intervention. Patient is progressing well with therapy with cotninued improvement noted in strength and ability to perform exercises and modify appropriately to achieve best results. He is becoming more active, fishing etc. and is having increased spasms in weak muscles right shoulder. He is benefitting from electrical stimulation and guidance from physical therapy intervention and would benefit from additional visits to achieve maximal strength and functional use right UE and be able to continue with self managment with appropriate modifications.    Rehab Potential  Good    Clinical Impairments Affecting Rehab Potential  (+)motivated, prior level of function(-)massive tear without surgery right RTC    PT Frequency  Other (comment) 1-2x/week    PT Duration  4 weeks    PT Treatment/Interventions  Patient/family education;Electrical Stimulation;Moist Heat;Neuromuscular re-education;Therapeutic exercise;Manual techniques    PT Next Visit Plan  modalities for  neuromuscular re-education, therapeutic exercises; manual STM; re assess QuickDash, strength and anticipate discharge to independent self management    PT Home Exercise Plan  scapular control, retraction exercises, precautions to avoid stress on shoulder; shoulder, chest and back exercises       Patient will benefit from skilled therapeutic intervention in order to improve the following deficits and impairments:  Decreased strength, Impaired perceived functional ability, Decreased activity tolerance, Impaired UE functional use  Visit Diagnosis: Muscle weakness (generalized) - Plan: PT plan of care cert/re-cert  Other muscle spasm - Plan: PT plan of care cert/re-cert     Problem List Patient Active Problem List   Diagnosis Date Noted  . Hand injury, left, initial encounter 10/23/2016  . Osteoporosis 03/29/2016  . Stress fracture of metatarsal due to multiple or repetitive stress 01/08/2016  . White coat  syndrome with high blood pressure without hypertension 12/15/2015  . Tubular adenoma of colon   . Arthritis 04/13/2015  . Body mass index (BMI) of 24.0-24.9 in adult 04/13/2015  . Cirrhosis ([place]) 04/13/2015  . 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 Cardiac murmur 04/13/2015  . Hypotestosteronism 04/13/2015  . Enlargement of spleen 04/13/2015  . Thrombocytopenia ([place]) 04/13/2015  . 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 Knee osteoarthritis 06/22/2014  . 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 BICEPS TENDON RUPTURE, RIGHT 08/14/2010  . ROTATOR CUFF SYNDROME, RIGHT 08/31/2008  . DEGENERATIVE JOINT DISEASE 03/03/2008  . DEGENERATIVE JOINT DISEASE, LEFT KNEE 03/03/2008  . OTHER ACQUIRED DEFORMITY OF OTHER PARTS OF LIMB 03/03/2008    Jomarie Longs PT 04/17/2017, 4:38 PM  [facility] [address] [place], [address] Phone: [phone]   Fax:  [phone]  Name: Tony Luna MRN: [id number] Date of Birth: Oct 04, 1940

## 2017-04-22 ENCOUNTER — Ambulatory Visit: Payer: PPO | Admitting: Internal Medicine

## 2017-04-22 ENCOUNTER — Encounter: Payer: Self-pay | Admitting: Internal Medicine

## 2017-04-22 VITALS — BP 152/72 | HR 66 | Temp 97.7°F | Resp 15 | Ht 72.0 in | Wt 178.8 lb

## 2017-04-22 DIAGNOSIS — Z131 Encounter for screening for diabetes mellitus: Secondary | ICD-10-CM

## 2017-04-22 DIAGNOSIS — R030 Elevated blood-pressure reading, without diagnosis of hypertension: Secondary | ICD-10-CM

## 2017-04-22 DIAGNOSIS — M719 Bursopathy, unspecified: Secondary | ICD-10-CM

## 2017-04-22 DIAGNOSIS — Z125 Encounter for screening for malignant neoplasm of prostate: Secondary | ICD-10-CM | POA: Diagnosis not present

## 2017-04-22 DIAGNOSIS — M810 Age-related osteoporosis without current pathological fracture: Secondary | ICD-10-CM | POA: Diagnosis not present

## 2017-04-22 DIAGNOSIS — K7460 Unspecified cirrhosis of liver: Secondary | ICD-10-CM

## 2017-04-22 DIAGNOSIS — M84377D Stress fracture, right toe(s), subsequent encounter for fracture with routine healing: Secondary | ICD-10-CM | POA: Diagnosis not present

## 2017-04-22 DIAGNOSIS — F419 Anxiety disorder, unspecified: Secondary | ICD-10-CM

## 2017-04-22 DIAGNOSIS — M67919 Unspecified disorder of synovium and tendon, unspecified shoulder: Secondary | ICD-10-CM | POA: Diagnosis not present

## 2017-04-22 DIAGNOSIS — M84374D Stress fracture, right foot, subsequent encounter for fracture with routine healing: Secondary | ICD-10-CM

## 2017-04-22 LAB — POCT GLYCOSYLATED HEMOGLOBIN (HGB A1C): Hemoglobin A1C: 4.8

## 2017-04-22 MED ORDER — ZOLPIDEM TARTRATE 5 MG PO TABS: ORAL_TABLET | ORAL | 3 refills | Status: DC

## 2017-04-22 NOTE — Patient Instructions (Addendum)
You are in excellent health!       For your anxiety:  Try using the Headspace  App on most cell phone    Anyone who wishes to check on his true spiritual condition may do so by noting what his voluntary thoughts have been over the last hours or days  What has se thought about when free to think of what he pleased? Toward what has his inner heart turned when it was free to turn where it would? When the bird of thought was let go, did it fly out like the raven to settle upon floating carcasses, or did it,  like the dove, circle and return again to the [place] of of God? Such a test is easy to run, and if we are honest with ourselves we can discover not only what we are but what we are going to become.  We'll soon be the sum of our voluntary thoughts.     While our thoughts stir our feelings,  And thus strongly influence our wills,  It is yet true that the will can be and should be master of our thoughts... Of course the troubled or tempted individual may find his thoughts  somewhat difficult to control, and even while he is concentrating upon a worthy object,  Wild and fugitive thoughts may play over his mind like heat lightening on a summer evening.. Theses are likely to be more bothersome than harmful and in the long run do not make much difference one way or another.   "To Be Right, we Must Think Right "  A.W. Leanne Chang

## 2017-04-22 NOTE — Progress Notes (Signed)
Patient ID: Tony Luna, male    DOB: [birth year]  Age: 76 y.o. MRN: [id number]  The patient is here for follow up on chronic and acute problems.  The risk factors are reflected in the social history.  The roster of all physicians providing medical care to patient - is listed in the Snapshot section of the chart.  Activities of daily living:  The patient is 100% independent in all ADLs: dressing, toileting, feeding as well as independent mobility  Home safety : The patient has smoke detectors in the home. They wear seatbelts.  There are no firearms at home. There is no violence in the home.   There is no risks for hepatitis, STDs or HIV. There is no   history of blood transfusion. They have no travel history to infectious disease endemic areas of the world.  The patient has seen their dentist in the last six month. They have seen their eye doctor in the last year. They admit to slight hearing difficulty with regard to whispered voices and some television programs.  They have deferred audiologic testing in the last year.  They do not  have excessive sun exposure. Discussed the need for sun protection: hats, long sleeves and use of sunscreen if there is significant sun exposure.   Diet: the importance of a healthy diet is discussed. They do have a healthy diet.  Depression screen: there are no signs or vegative symptoms of depression- irritability, change in appetite, anhedonia, sadness/tearfullness.  Cognitive assessment: the patient manages all their financial and personal affairs and is actively engaged. They could relate day,date,year and events; recalled 2/3 objects at 3 minutes; performed clock-face test normally.  The following portions of the patient's history were reviewed and updated as appropriate: allergies, current medications, past family history, past medical history,  past surgical history, past social history  and problem list.  Visual acuity was not assessed per patient  preference since she has regular follow up with her ophthalmologist. Hearing and body mass index were assessed and reviewed.   During the course of the visit the patient was educated and counseled about appropriate screening and preventive services including : fall prevention , diabetes screening, nutrition counseling, colorectal cancer screening, and recommended immunizations.    CC: The primary encounter diagnosis was Screening for diabetes mellitus. Diagnoses of Cirrhosis of liver without ascites, unspecified hepatic cirrhosis type ([place]), Age-related osteoporosis without current pathological fracture, Disorder of bursae and tendons in shoulder region, Stress fracture of metatarsal due to multiple or repetitive stress, right, with routine healing, subsequent encounter, White coat syndrome with high blood pressure without hypertension, Prostate cancer screening, and Anxiety disorder, unspecified type were also pertinent to this visit. Elevated blood pressure reading today in the office  using his  home cuff and here.  Bp is 125/67 at home  Developing an increasing intolerance to noise and adversity .  Denies anxiety,  "life is good"  But reports that  "I have a lot going on"   Has had testosterone pellet  implantation for the last month  Exercising  6 to 7 hours per week, denies chest pain   Broke a bone in right foot,  Saw podiatrist at [facility]   Diagnosed a Stress fracture Also injured right shoulder, diagnosed with a right rotator cuff syndrome with complete tear of 2 muscles supra and infraspinatus tendons,  Wants to  avoid surgery (shoulder replacement)   Doing PT.  No persistent pain .  Feels he is improving and  muscles getting stronger   Wears hearing aides,  failed the forced whisper test.  Aides have an adjustible volume.   Colonoscopy 1 polyp found by Wohl  2017   25 and me test done :  No AD gene,  One for Macular degeneration  Vision test done,  Early cataracts,  No Macular  degeneration    History Tony Luna has a past medical history of Allergy, Arthritis, Complication of anesthesia, Dysrhythmia, Esophageal tear, Hypothyroidism, Pneumonia, Primary osteoarthritis of left knee (06/22/2014), and Rotator cuff tear.   He has a past surgical history that includes Knee surgery; Rotator cuff repair; Tonsillectomy; Spine surgery; Hernia repair (Left); Cautery of turbinates; blepharaplasty; Total knee arthroplasty (Left, 06/22/2014); and Colonoscopy with propofol (N/A, 07/19/2015).   His family history includes Alcohol abuse in his mother; Diabetes in his mother; Mental retardation in his mother.He reports that he quit smoking about 33 years ago. His smoking use included cigarettes. He has a 15.00 pack-year smoking history. His smokeless tobacco use includes chew. He reports that he does not drink alcohol or use drugs.  Outpatient Medications Prior to Visit  Medication Sig Dispense Refill  . Bitter Melon POWD Take 1 tablet by mouth daily.    . Cholecalciferol (VITAMIN D-3 PO) Take 5,000 Units by mouth daily. 5,00000 u in the morning, 2,000 u in the evening    . levothyroxine (SYNTHROID, LEVOTHROID) 75 MCG tablet Take by mouth.    . meloxicam (MOBIC) 15 MG tablet Take 15 mg by mouth daily.    Marland Kitchen MILK THISTLE PO Take 1 tablet by mouth 2 (two) times daily.    . Multiple Vitamin (MULTIVITAMIN WITH MINERALS) TABS tablet Take 1 tablet by mouth 2 (two) times daily.    Marland Kitchen OVER THE COUNTER MEDICATION Take 4 sprays by mouth at bedtime. Secretropin oral spray - growth hormone    . OVER THE COUNTER MEDICATION Take 1 capsule by mouth 3 (three) times daily. Instaflex    . OVER THE COUNTER MEDICATION Take 1,000 mg by mouth daily. Turmeric with Meriva    . OVER THE COUNTER MEDICATION Take 3,000 mg by mouth daily. Omega Cure    . OVER THE COUNTER MEDICATION Take 250 mg by mouth daily. Bio Magnesium Citrate    . OVER THE COUNTER MEDICATION Take 20,000 mcg by mouth daily. Vitamin B12-methylfolate     . OVER THE COUNTER MEDICATION Take 1 tablet by mouth daily. Pure adrenal 400    . OVER THE COUNTER MEDICATION Take 800 mcg by mouth daily. Active B Folate    . OVER THE COUNTER MEDICATION Inject 50 Units as directed daily. Semorelin    . OVER THE COUNTER MEDICATION 2 tablets daily. VitaPrime    . OVER THE COUNTER MEDICATION 1,000 mg daily. Super Bio-C buffered    . OVER THE COUNTER MEDICATION 1,000 mg daily. L'Arginine    . OVER THE COUNTER MEDICATION 2 capsules 2 (two) times daily. SLF Forte    . OVER THE COUNTER MEDICATION 1,000 mg. Myomin    . sildenafil (VIAGRA) 100 MG tablet Take 1 tablet (100 mg total) by mouth as needed for erectile dysfunction. 10 tablet 5  . tadalafil (CIALIS) 5 MG tablet Take 5 mg by mouth.    . tamsulosin (FLOMAX) 0.4 MG CAPS capsule TAKE ONE (1) CAPSULE BY MOUTH EACH DAY FOR URINATION 90 capsule 3  . TAURINE PO Take 1,000 mg by mouth daily.     . Testosterone (TESTOPEL) 75 MG PLLT by Implant route.    Marland Kitchen  zolpidem (AMBIEN) 5 MG tablet TAKE 0.5-1 TABLETS BY MOUTH AT BEDTIME AS NEEDED USE AS NEEDED FOR SLEEP  3   No facility-administered medications prior to visit.     Review of Systems   Patient denies headache, fevers, malaise, unintentional weight loss, skin rash, eye pain, sinus congestion and sinus pain, sore throat, dysphagia,  hemoptysis , cough, dyspnea, wheezing, chest pain, palpitations, orthopnea, edema, abdominal pain, nausea, melena, diarrhea, constipation, flank pain, dysuria, hematuria, urinary  Frequency, nocturia, numbness, tingling, seizures,  Focal weakness, Loss of consciousness,  Tremor, insomnia, depression, anxiety, and suicidal ideation.     Objective:  BP (!) 152/72 (BP Location: Left Arm, Patient Position: Sitting, Cuff Size: Normal)   Pulse 66   Temp 97.7 F (36.5 C) (Oral)   Resp 15   Ht 6' (1.829 m)   Wt 178 lb 12.8 oz (81.1 kg)   SpO2 98%   BMI 24.25 kg/m   Physical Exam   General appearance: alert, cooperative and appears  stated age Ears: normal TM's and external ear canals both ears Throat: lips, mucosa, and tongue normal; teeth and gums normal Neck: no adenopathy, no carotid bruit, supple, symmetrical, trachea midline and thyroid not enlarged, symmetric, no tenderness/mass/nodules Back: symmetric, no curvature. ROM normal. No CVA tenderness. Lungs: clear to auscultation bilaterally Heart: regular rate and rhythm, S1, S2 normal, no murmur, click, rub or gallop Abdomen: soft, non-tender; bowel sounds normal; no masses,  no organomegaly Pulses: 2+ and symmetric Skin: Skin color, texture, turgor normal. No rashes or lesions Lymph nodes: Cervical, supraclavicular, and axillary nodes normal.    Assessment & Plan:   Problem List Items Addressed This Visit    Anxiety disorder, unspecified    Manifested as irritability and intolerance to interruptions and noise.  Long discussion about natural ways to manage symptoms.  patient dissuaded from prn use of alprazolam,       Cirrhosis ([place])    Hepatic synthetic function remains normal and he is asymptomatic  Lab Results  Component Value Date   ALT 36 02/15/2017   AST 38 02/15/2017   ALKPHOS 99 02/15/2017         Disorder of bursae and tendons in shoulder region    with complete tear of supraspinatus and infraspinatus muscles by recent MRI .  Improving with PT      Osteoporosis    Managed with testosterone implantation.  Last DEXA  scan Nov 2017 .  Continue weight bearing exercising,  Calcium and vitamin D .       Prostate cancer screening    PSA is normal.   Lab Results  Component Value Date   PSA 1.2 02/15/2017   PSA 0.8 04/09/2016   PSA 0.9 12/08/2015         Stress fracture of metatarsal due to multiple or repetitive stress    Recurrent right foot,managed by Weston Anna.      White coat syndrome with high blood pressure without hypertension    Home readings remain < 130/80. Renal function is normal.  Lab Results  Component Value  Date   CREATININE 1.1 02/15/2017   Lab Results  Component Value Date   NA 141 02/15/2017   K 4.1 02/15/2017   CL 101 06/23/2014   CO2 25 06/23/2014          Other Visit Diagnoses    Screening for diabetes mellitus    -  Primary   Relevant Orders   POCT HgB A1C (Completed)  A total of 40 minutes was spent with patient more than half of which was spent in counseling patient on the above mentioned issues , reviewing and explaining recent labs and imaging studies done, and coordination of care.  I am having Imagene Riches maintain his OVER THE COUNTER MEDICATION, OVER THE COUNTER MEDICATION, OVER THE COUNTER MEDICATION, multivitamin with minerals, OVER THE COUNTER MEDICATION, Cholecalciferol (VITAMIN D-3 PO), TAURINE PO, OVER THE COUNTER MEDICATION, OVER THE COUNTER MEDICATION, Bitter Melon, MILK THISTLE PO, OVER THE COUNTER MEDICATION, OVER THE COUNTER MEDICATION, OVER THE COUNTER MEDICATION, OVER THE COUNTER MEDICATION, OVER THE COUNTER MEDICATION, OVER THE COUNTER MEDICATION, OVER THE COUNTER MEDICATION, OVER THE COUNTER MEDICATION, sildenafil, tadalafil, meloxicam, Testosterone, tamsulosin, levothyroxine, and zolpidem.  Meds ordered this encounter  Medications  . zolpidem (AMBIEN) 5 MG tablet    Sig: TAKE 0.5-1 TABLETS BY MOUTH AT BEDTIME AS NEEDED USE AS NEEDED FOR SLEEP    Dispense:  30 tablet    Refill:  3    Medications Discontinued During This Encounter  Medication Reason  . zolpidem (AMBIEN) 5 MG tablet Reorder    Follow-up: No Follow-up on file.   [name], MD

## 2017-04-23 ENCOUNTER — Telehealth: Payer: Self-pay

## 2017-04-23 DIAGNOSIS — F419 Anxiety disorder, unspecified: Secondary | ICD-10-CM | POA: Insufficient documentation

## 2017-04-23 DIAGNOSIS — Z125 Encounter for screening for malignant neoplasm of prostate: Secondary | ICD-10-CM | POA: Insufficient documentation

## 2017-04-23 NOTE — Assessment & Plan Note (Signed)
with complete tear of supraspinatus and infraspinatus muscles by recent MRI .  Improving with PT

## 2017-04-23 NOTE — Assessment & Plan Note (Signed)
Hepatic synthetic function remains normal and he is asymptomatic  Lab Results  Component Value Date   ALT 36 02/15/2017   AST 38 02/15/2017   ALKPHOS 99 02/15/2017

## 2017-04-23 NOTE — Assessment & Plan Note (Signed)
Recurrent right foot,managed by Weston Anna.

## 2017-04-23 NOTE — Assessment & Plan Note (Signed)
PSA is normal.   Lab Results  Component Value Date   PSA 1.2 02/15/2017   PSA 0.8 04/09/2016   PSA 0.9 12/08/2015

## 2017-04-23 NOTE — Assessment & Plan Note (Signed)
Manifested as irritability and intolerance to interruptions and noise.  Long discussion about natural ways to manage symptoms.  patient dissuaded from prn use of alprazolam,

## 2017-04-23 NOTE — Assessment & Plan Note (Signed)
Home readings remain < 130/80. Renal function is normal.  Lab Results  Component Value Date   CREATININE 1.1 02/15/2017   Lab Results  Component Value Date   NA 141 02/15/2017   K 4.1 02/15/2017   CL 101 06/23/2014   CO2 25 06/23/2014

## 2017-04-23 NOTE — Telephone Encounter (Signed)
Done

## 2017-04-23 NOTE — Assessment & Plan Note (Addendum)
Managed with testosterone implantation.  Last DEXA  scan Nov 2017 .  Continue weight bearing exercising,  Calcium and vitamin D .  

## 2017-04-23 NOTE — Telephone Encounter (Signed)
Copied from [place] [id number]. Topic: General - Other >> Apr 23, 2017  9:59 AM Cecelia Byars, NT wrote: Reason for CRM: Patient was in to see Dr Derrel Nip yesterday and she was going to give him a quote he would like her to e mail it to him

## 2017-04-25 ENCOUNTER — Other Ambulatory Visit: Payer: Self-pay

## 2017-04-25 ENCOUNTER — Telehealth: Payer: Self-pay

## 2017-04-25 MED ORDER — TADALAFIL 5 MG PO TABS: 5.0000 mg | ORAL_TABLET | Freq: Every day | ORAL | 11 refills | Status: DC | PRN

## 2017-04-25 NOTE — Telephone Encounter (Signed)
He was given a printed Rx

## 2017-04-25 NOTE — Telephone Encounter (Signed)
Pt pharmacy sent a refill request of cialis 5mg . I see where you dictated for him to continue tamsulosin. Did not see anything about cialis. Please advise.

## 2017-05-01 ENCOUNTER — Encounter: Payer: Self-pay | Admitting: Podiatry

## 2017-05-01 ENCOUNTER — Ambulatory Visit: Payer: PPO | Admitting: Podiatry

## 2017-05-01 DIAGNOSIS — M2142 Flat foot [pes planus] (acquired), left foot: Secondary | ICD-10-CM | POA: Diagnosis not present

## 2017-05-01 DIAGNOSIS — M2141 Flat foot [pes planus] (acquired), right foot: Secondary | ICD-10-CM | POA: Diagnosis not present

## 2017-05-01 NOTE — Progress Notes (Signed)
Subjective:  Patient ID: Tony Luna, male    DOB: 10/08/1940,  MRN: [id number] HPI Chief Complaint  Patient presents with  . Foot Orthotics    Requesting orthotics - patient states that he was being treated by Dr. Noemi Luna for stress fracture which is now healed but thought he would benefit from custom orthotics, active in exercising and hiking-very flat feet (R>L)     76 y.o. male presents with the above complaint.     Past Medical History:  Diagnosis Date  . Allergy   . Arthritis   . Complication of anesthesia    :"need catheter"  . Dysrhythmia    ? something told by reg dr  unable to say what it was  . Esophageal tear   . Hypothyroidism   . Pneumonia    hx  . 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 Rotator cuff tear    Past Surgical History:  Procedure Laterality Date  . blepharaplasty    . CAUTERY OF TURBINATES    . COLONOSCOPY WITH PROPOFOL N/A 07/19/2015   Procedure: COLONOSCOPY WITH PROPOFOL;  Surgeon: Tony Lame, MD;  Location: [facility];  Service: [facility];  Laterality: N/A;  . HERNIA REPAIR Left    inguinal herniorrhapy  . KNEE SURGERY     ? loose body/ chondral defect 30yrs  . ROTATOR CUFF REPAIR     left  . SPINE SURGERY    . TONSILLECTOMY    . TOTAL KNEE ARTHROPLASTY Left 06/22/2014   Procedure: TOTAL KNEE ARTHROPLASTY;  Surgeon: Tony Bridge, MD;  Location: [facility];  Service: Orthopedics;  Laterality: Left;    Current Outpatient Medications:  .  Bitter Melon POWD, Take 1 tablet by mouth daily., Disp: , Rfl:  .  Cholecalciferol (VITAMIN D-3 PO), Take 5,000 Units by mouth daily. 5,00000 u in the morning, 2,000 u in the evening, Disp: , Rfl:  .  levothyroxine (SYNTHROID, LEVOTHROID) 75 MCG tablet, Take by mouth., Disp: , Rfl:  .  meloxicam (MOBIC) 15 MG tablet, Take 15 mg by mouth daily., Disp: , Rfl:  .  MILK THISTLE PO, Take 1 tablet by mouth 2 (two) times daily., Disp: , Rfl:  .  Multiple Vitamin (MULTIVITAMIN WITH MINERALS) TABS  tablet, Take 1 tablet by mouth 2 (two) times daily., Disp: , Rfl:  .  OVER THE COUNTER MEDICATION, Take 4 sprays by mouth at bedtime. Secretropin oral spray - growth hormone, Disp: , Rfl:  .  OVER THE COUNTER MEDICATION, Take 1 capsule by mouth 3 (three) times daily. Instaflex, Disp: , Rfl:  .  OVER THE COUNTER MEDICATION, Take 1,000 mg by mouth daily. Turmeric with Meriva, Disp: , Rfl:  .  OVER THE COUNTER MEDICATION, Take 3,000 mg by mouth daily. Omega Cure, Disp: , Rfl:  .  OVER THE COUNTER MEDICATION, Take 250 mg by mouth daily. Bio Magnesium Citrate, Disp: , Rfl:  .  OVER THE COUNTER MEDICATION, Take 20,000 mcg by mouth daily. Vitamin B12-methylfolate, Disp: , Rfl:  .  OVER THE COUNTER MEDICATION, Take 1 tablet by mouth daily. Pure adrenal 400, Disp: , Rfl:  .  OVER THE COUNTER MEDICATION, Take 800 mcg by mouth daily. Active B Folate, Disp: , Rfl:  .  OVER THE COUNTER MEDICATION, Inject 50 Units as directed daily. Semorelin, Disp: , Rfl:  .  OVER THE COUNTER MEDICATION, 2 tablets daily. VitaPrime, Disp: , Rfl:  .  OVER THE COUNTER MEDICATION, 1,000 mg daily. Super Bio-C buffered, Disp: , Rfl:  .  OVER THE COUNTER MEDICATION, 1,000 mg daily. L'Arginine, Disp: , Rfl:  .  OVER THE COUNTER MEDICATION, 2 capsules 2 (two) times daily. SLF Forte, Disp: , Rfl:  .  OVER THE COUNTER MEDICATION, 1,000 mg. Myomin, Disp: , Rfl:  .  sildenafil (VIAGRA) 100 MG tablet, Take 1 tablet (100 mg total) by mouth as needed for erectile dysfunction., Disp: 10 tablet, Rfl: 5 .  tadalafil (CIALIS) 5 MG tablet, Take 1 tablet (5 mg total) by mouth daily as needed for erectile dysfunction., Disp: 30 tablet, Rfl: 11 .  tamsulosin (FLOMAX) 0.4 MG CAPS capsule, TAKE ONE (1) CAPSULE BY MOUTH EACH DAY FOR URINATION, Disp: 90 capsule, Rfl: 3 .  TAURINE PO, Take 1,000 mg by mouth daily. , Disp: , Rfl:  .  Testosterone (TESTOPEL) 75 MG PLLT, by Implant route., Disp: , Rfl:  .  zolpidem (AMBIEN) 5 MG tablet, TAKE 0.5-1 TABLETS  BY MOUTH AT BEDTIME AS NEEDED USE AS NEEDED FOR SLEEP, Disp: 30 tablet, Rfl: 3  No Known Allergies Review of Systems  All other systems reviewed and are negative.  Objective:  There were no vitals filed for this visit.  General: Well developed, nourished, in no acute distress, alert and oriented x3   Dermatological: Skin is warm, dry and supple bilateral. Nails x 10 are well maintained; remaining integument appears unremarkable at this time. There are no open sores, no preulcerative lesions, no rash or signs of infection present.  Vascular: Dorsalis Pedis artery and Posterior Tibial artery pedal pulses are 2/4 bilateral with immedate capillary fill time. Pedal hair growth present. No varicosities and no lower extremity edema present bilateral.   Neruologic: Grossly intact via light touch bilateral. Vibratory intact via tuning fork bilateral. Protective threshold with Semmes Wienstein monofilament intact to all pedal sites bilateral. Patellar and Achilles deep tendon reflexes 2+ bilateral. No Babinski or clonus noted bilateral.   Musculoskeletal: No gross boney pedal deformities bilateral. No pain, crepitus, or limitation noted with foot and ankle range of motion bilateral. Muscular strength 5/5 in all groups tested bilateral.  There is a collapse of the medial longitudinal arch which is rigid in nature basically resulting in a compensated forefoot varus resulting in hindfoot valgus.  He has osteoarthritic changes of the midfoot resulting in varus and valgus rotation based on the subtalar joint.  The forefoot moves with the subtalar joint.  Left foot is rectus and normal.  His orthopedic surgeon told him is a stress fracture to cause the problem. Gait: Unassisted, Nonantalgic.    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  Plan: He needs to be casted for orthotics.  [facility] will evaluate him in a few weeks.     Tony Luna, [place]

## 2017-05-10 IMAGING — US US ABDOMEN LIMITED
1 series · 14 of 25 positions shown · non-contrast
Comparison: CT abdomen pelvis of 12/10/2007 and ultrasound of the
abdomen of 11/25/2007

CLINICAL DATA: History of cirrhosis of the liver

EXAM:
US ABDOMEN LIMITED - RIGHT UPPER QUADRANT

[Series 1: us abdomen limited · 0.13mm/px · 14 of 47 slices shown]
[im 1/47]
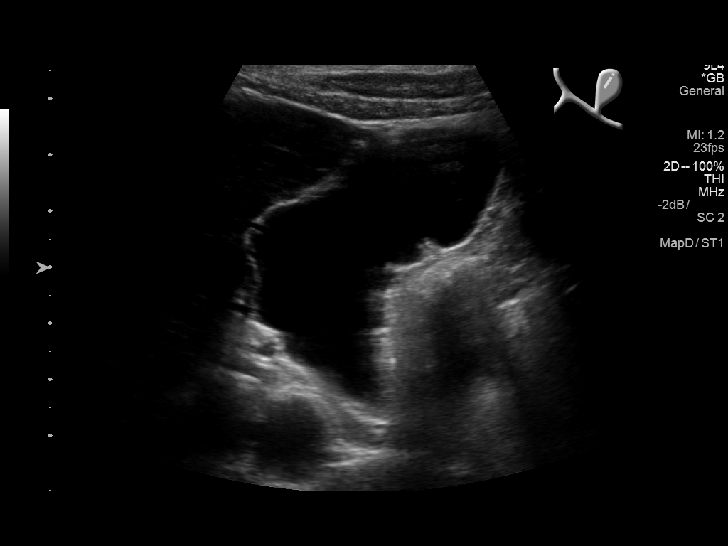
[im 4/47]
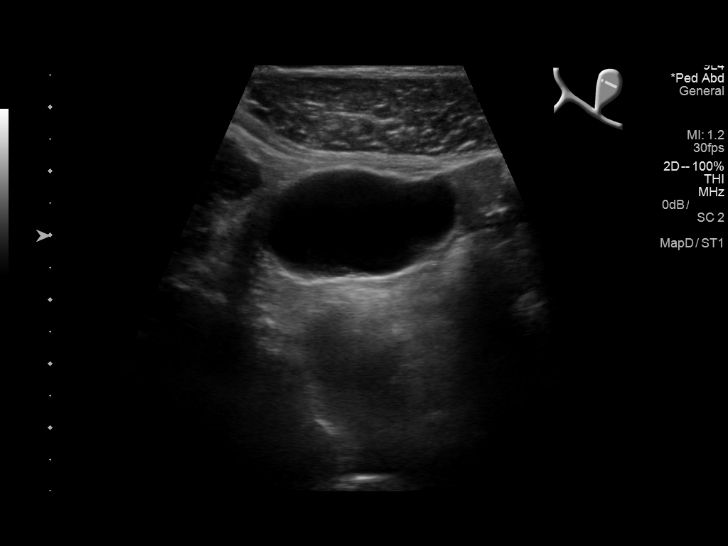
[im 8/47]
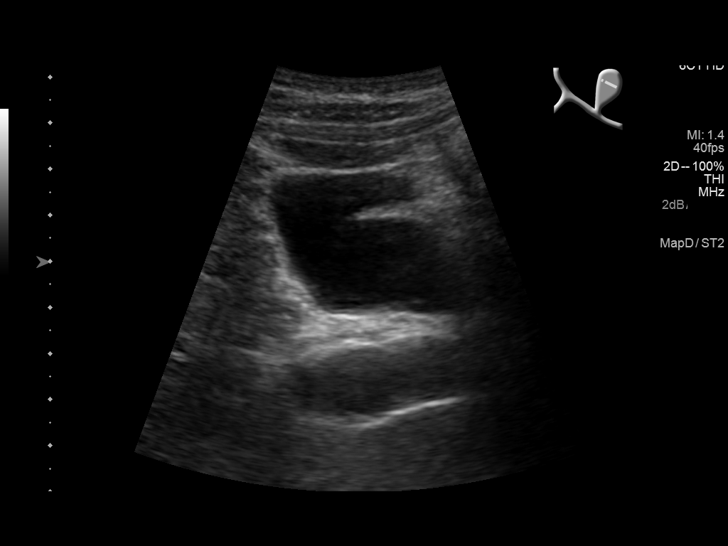
[im 12/47]
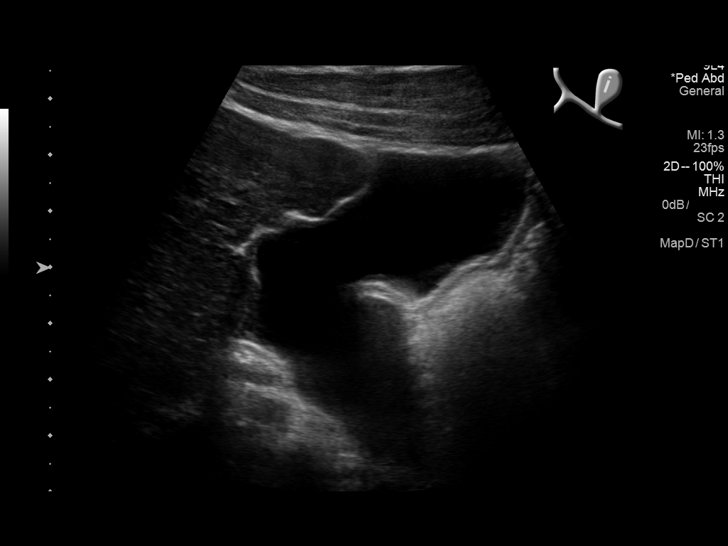
[im 16/47]
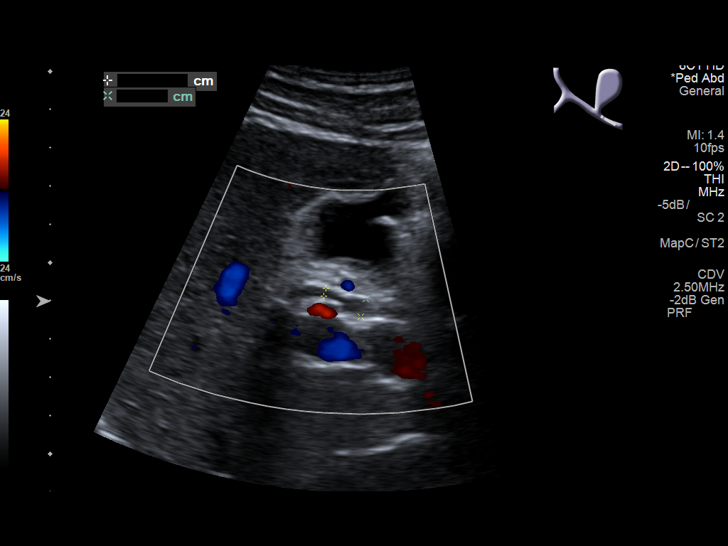
[im 18/47]
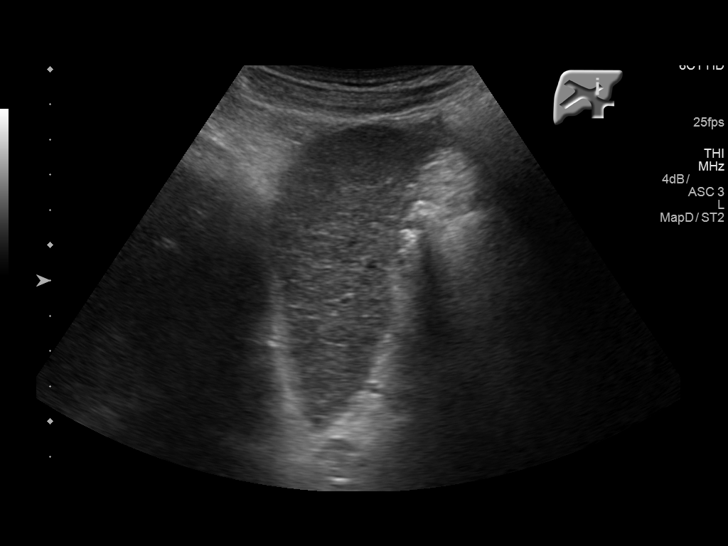
[im 22/47]
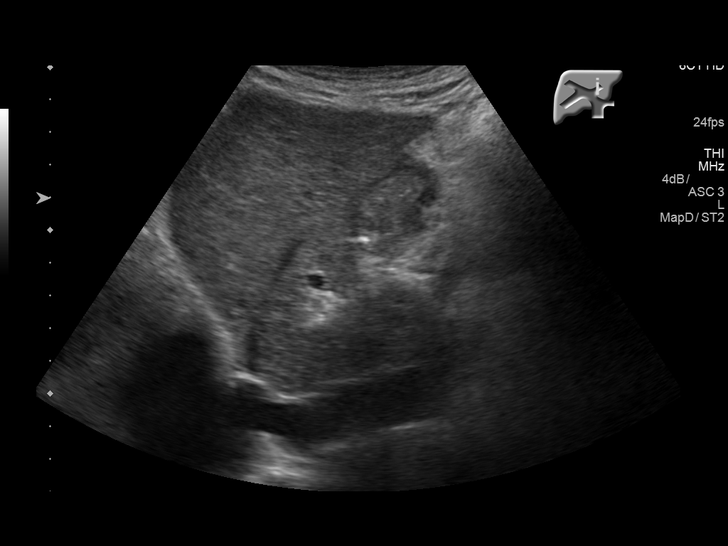
[im 25/47]
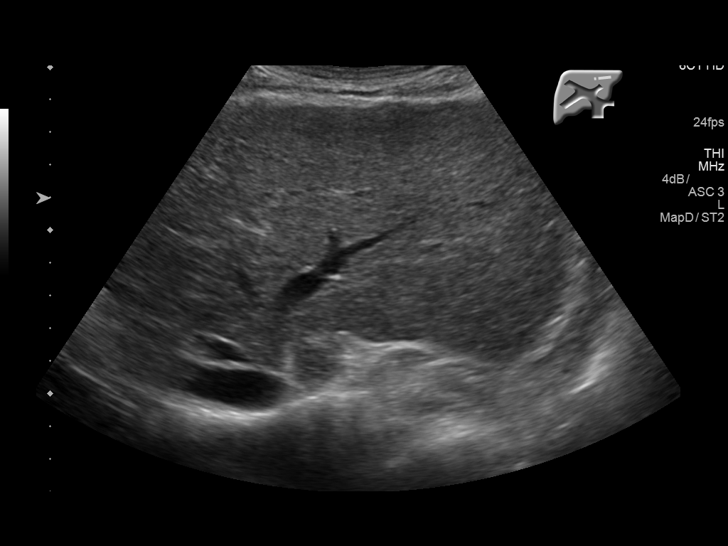
[im 29/47]
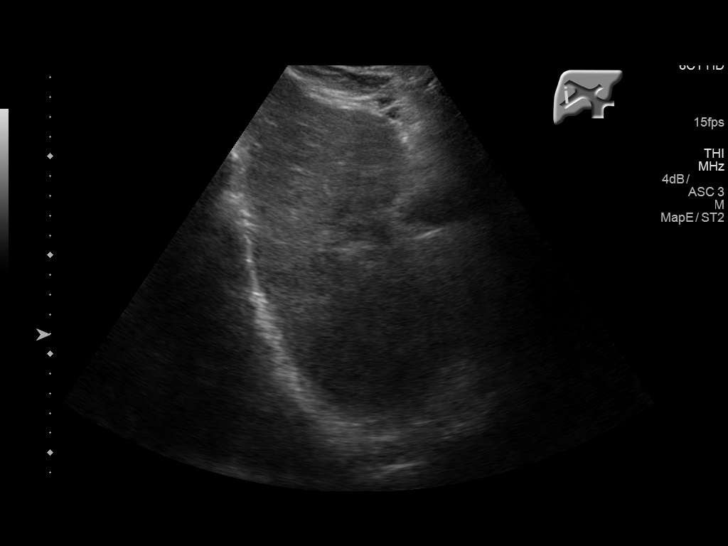
[im 31/47]
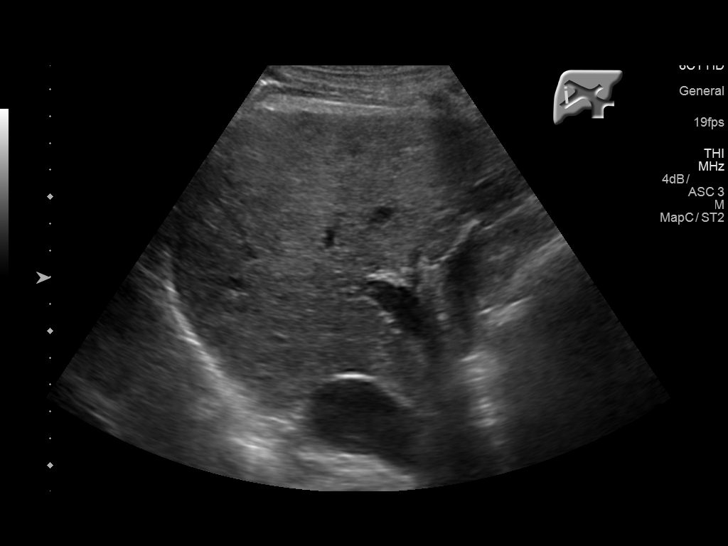
[im 35/47]
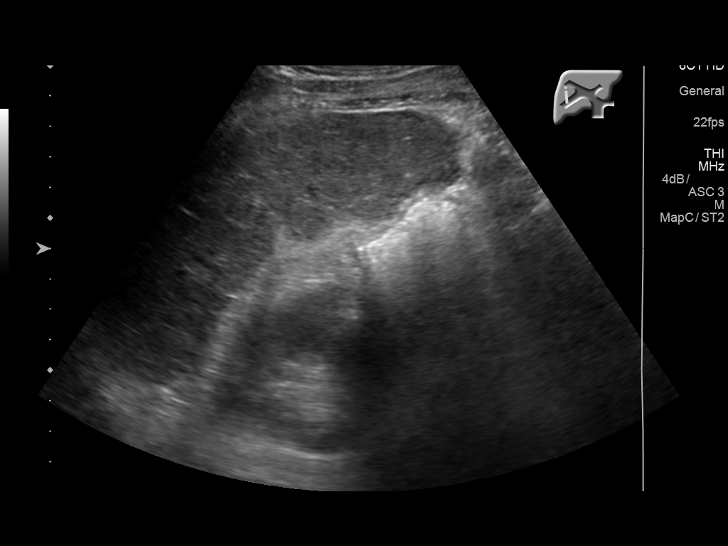
[im 39/47]
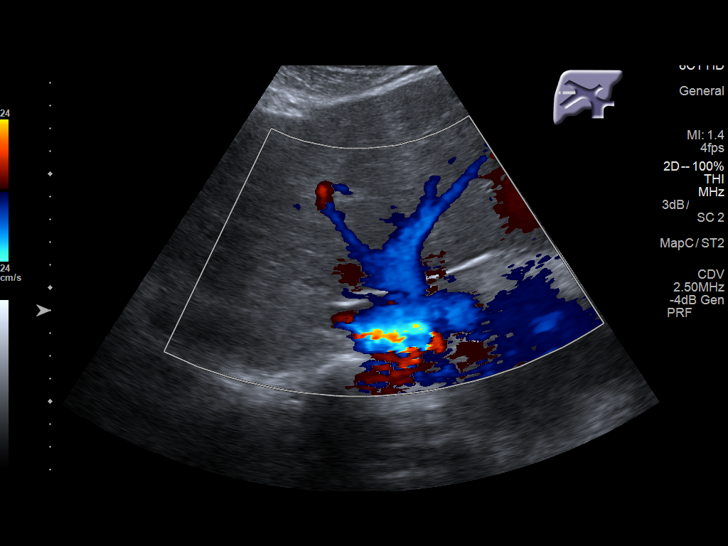
[im 43/47]
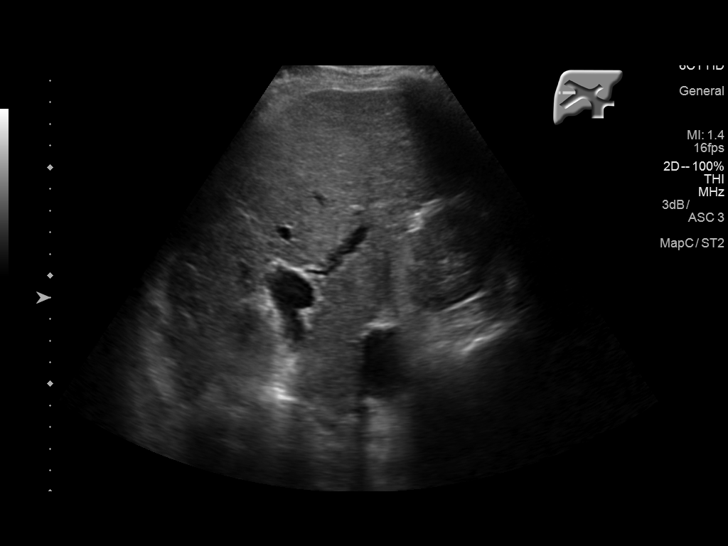
[im 47/47]
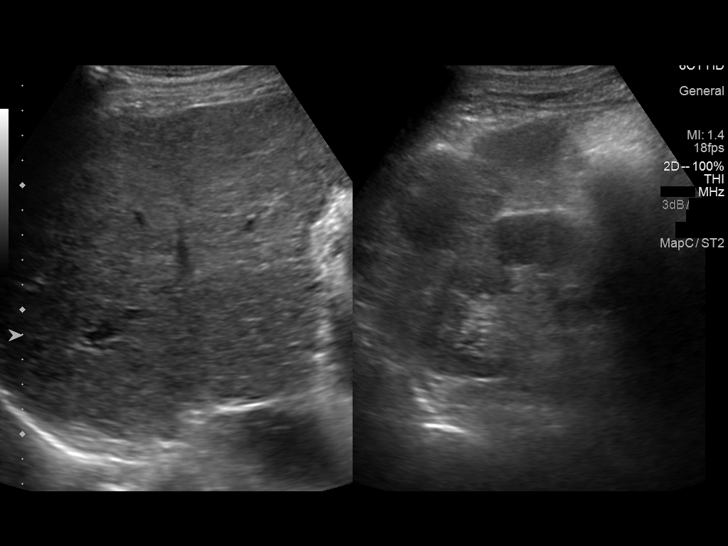

[14 of 25 positions shown; findings below may reference images not displayed]

FINDINGS: Gallbladder:

The gallbladder is visualized and no gallstones are seen. No
gallbladder wall thickening is noted.

Common bile duct:

Diameter: The common bile duct is within normal limits measuring
mm in diameter.

Liver:

The liver is echogenic and inhomogeneous with nodular contour
consistent with changes of cirrhosis as noted previously. No focal
hepatic abnormality is seen.
IMPRESSION: 1. The liver is echogenic and inhomogeneous with nodular contours
consistent with cirrhosis. No focal hepatic abnormality is noted.
2. No gallstones.

## 2017-05-14 ENCOUNTER — Ambulatory Visit (INDEPENDENT_AMBULATORY_CARE_PROVIDER_SITE_OTHER): Payer: PPO | Admitting: Orthotics

## 2017-05-14 DIAGNOSIS — M2141 Flat foot [pes planus] (acquired), right foot: Secondary | ICD-10-CM

## 2017-05-14 DIAGNOSIS — M2142 Flat foot [pes planus] (acquired), left foot: Secondary | ICD-10-CM

## 2017-05-14 NOTE — Progress Notes (Signed)
Patient wants several pair of F/O to address issues with pes planus foot (acquired).  Patient was scanned and order place with [employer] for highly inverted 3D device.   Patient advised 1 pair $300 2-4 pair $199

## 2017-05-29 ENCOUNTER — Ambulatory Visit: Payer: Self-pay

## 2017-06-04 ENCOUNTER — Ambulatory Visit: Payer: Self-pay

## 2017-06-06 ENCOUNTER — Encounter: Payer: Self-pay | Admitting: Internal Medicine

## 2017-06-06 ENCOUNTER — Ambulatory Visit (INDEPENDENT_AMBULATORY_CARE_PROVIDER_SITE_OTHER): Payer: PPO

## 2017-06-06 VITALS — BP 138/62 | HR 61 | Temp 97.7°F | Resp 14 | Ht 72.0 in | Wt 174.8 lb

## 2017-06-06 DIAGNOSIS — R739 Hyperglycemia, unspecified: Secondary | ICD-10-CM

## 2017-06-06 DIAGNOSIS — Z0000 Encounter for general adult medical examination without abnormal findings: Secondary | ICD-10-CM | POA: Diagnosis not present

## 2017-06-06 DIAGNOSIS — Z23 Encounter for immunization: Secondary | ICD-10-CM | POA: Diagnosis not present

## 2017-06-06 LAB — POCT GLYCOSYLATED HEMOGLOBIN (HGB A1C): Hemoglobin A1C: 5

## 2017-06-06 NOTE — Progress Notes (Signed)
Subjective:   Tony Luna is a 77 y.o. male who presents for Medicare Annual/Subsequent preventive examination.  Review of Systems:  No ROS.  Medicare Wellness Visit. Additional risk factors are reflected in the social history.  Cardiac Risk Factors include: male gender;advanced age (>19men, >74 women);hypertension     Objective:    Vitals: BP 138/62 (BP Location: Left Arm, Patient Position: Sitting, Cuff Size: Normal)   Pulse 61   Temp 97.7 F (36.5 C) (Oral)   Resp 14   Ht 6' (1.829 m)   Wt 174 lb 12.8 oz (79.3 kg)   BMI 23.71 kg/m   Body mass index is 23.71 kg/m.  Advanced Directives 06/06/2017 01/10/2017 05/29/2016 07/19/2015 06/14/2014  Does Patient Have a Medical Advance Directive? Yes Yes Yes Yes Yes  Type of Advance Directive Living will;Healthcare Power of [place];Living will [place];Living will - Living will;Healthcare Power of Attorney  Does patient want to make changes to medical advance directive? No - Patient declined - No - Patient declined - No - Patient declined  Copy of [place] in Chart? No - copy requested - Yes - Yes    Tobacco Social History   Tobacco Use  Smoking Status Former Smoker  . Packs/day: 1.00  . Years: 15.00  . Pack years: 15.00  . Types: Cigarettes  . Last attempt to quit: 06/15/1983  . Years since quitting: 34.0  Smokeless Tobacco Current User  . Types: Chew     Ready to quit: Not Answered Counseling given: Not Answered   Clinical Intake:  Pre-visit preparation completed: No  Pain : No/denies pain     Nutritional Status: BMI of 19-24  Normal Diabetes: No  How often do you need to have someone help you when you read instructions, pamphlets, or other written materials from your doctor or pharmacy?: 1 - Never  Interpreter Needed?: No     Past Medical History:  Diagnosis Date  . Allergy   . Arthritis   . Complication of anesthesia    :"need  catheter"  . Dysrhythmia    ? something told by reg dr  unable to say what it was  . Esophageal tear   . Hypothyroidism   . Pneumonia    hx  . 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 Rotator cuff tear    Past Surgical History:  Procedure Laterality Date  . blepharaplasty    . CAUTERY OF TURBINATES    . COLONOSCOPY WITH PROPOFOL N/A 07/19/2015   Procedure: COLONOSCOPY WITH PROPOFOL;  Surgeon: [name], MD;  Location: [facility] END[facility]COPY;  Service: Endoscopy;  Laterality: N/A;  . HERNIA REPAIR Left    inguinal herniorrhapy  . KNEE SURGERY     ? loose body/ chondral defect 36yrs  . ROTATOR CUFF REPAIR     left  . SPINE SURGERY    . TONSILLECTOMY    . 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 Alcohol abuse Mother   . Diabetes Mother   . Mental retardation Mother    Social History   Socioeconomic History  . Marital status: Married    Spouse name: None  . Number of children: 2  . Years of education: None  . Highest education level: None  Social Needs  .  Financial resource strain: Not hard at all  . Food insecurity - worry: Never true  . Food insecurity - inability: Never true  . Transportation needs - medical: No  . Transportation needs - non-medical: No  Occupational History  . Occupation: Charity fundraiser  Tobacco Use  . Smoking status: Former Smoker    Packs/day: 1.00    Years: 15.00    Pack years: 15.00    Types: Cigarettes    Last attempt to quit: 06/15/1983    Years since quitting: 34.0  . Smokeless tobacco: Current User    Types: Chew  Substance and Sexual Activity  . Alcohol use: No    Comment: quit 10-2007  . Drug use: No  . Sexual activity: Not Currently  Other Topics Concern  . None  Social History Narrative   [employer] graduate   Avid hunter   Very active   Walks 1 hour daily      Outpatient Encounter Medications as of 06/06/2017  Medication Sig  . Bitter Melon POWD Take 1 tablet by mouth daily.  . Cholecalciferol (VITAMIN D-3 PO) Take 5,000 Units by mouth daily. 5,00000 u in the morning, 2,000 u in the evening  . levothyroxine (SYNTHROID, LEVOTHROID) 75 MCG tablet Take by mouth.  . meloxicam (MOBIC) 15 MG tablet Take 15 mg by mouth daily as needed.   Marland Kitchen MILK THISTLE PO Take 1 tablet by mouth 2 (two) times daily.  . Multiple Vitamin (MULTIVITAMIN WITH MINERALS) TABS tablet Take 1 tablet by mouth 2 (two) times daily.  Marland Kitchen OVER THE COUNTER MEDICATION Take 4 sprays by mouth at bedtime. Secretropin oral spray - growth hormone  . OVER THE COUNTER MEDICATION Take 1 capsule by mouth 3 (three) times daily. Instaflex  . OVER THE COUNTER MEDICATION Take 1,000 mg by mouth daily. Turmeric with Meriva  . OVER THE COUNTER MEDICATION Take 3,000 mg by mouth daily. Omega Cure  . OVER THE COUNTER MEDICATION Take 250 mg by mouth daily. Bio Magnesium Citrate  . OVER THE COUNTER MEDICATION Take 20,000 mcg by mouth daily. Vitamin B12-methylfolate  . OVER THE COUNTER MEDICATION Take 1 tablet by mouth daily. Pure adrenal 400  . OVER THE COUNTER MEDICATION Take 800 mcg by mouth daily. Active B Folate  . OVER THE COUNTER MEDICATION Inject 50 Units as directed daily. Semorelin  . OVER THE COUNTER MEDICATION 2 tablets daily. VitaPrime  . OVER THE COUNTER MEDICATION 1,000 mg daily. Super Bio-C buffered  . OVER THE COUNTER MEDICATION 1,000 mg daily. L'Arginine  . OVER THE COUNTER MEDICATION 2 capsules 2 (two) times daily. SLF Forte  . OVER THE COUNTER MEDICATION 1,000 mg. Myomin  . sildenafil (VIAGRA) 100 MG tablet Take 1 tablet (100 mg total) by mouth as needed for erectile dysfunction.  . tadalafil (CIALIS) 5 MG tablet Take 1 tablet (5 mg total) by mouth daily as needed for erectile dysfunction.  . tamsulosin (FLOMAX) 0.4 MG CAPS capsule TAKE ONE (1) CAPSULE BY MOUTH EACH DAY FOR URINATION  .  TAURINE PO Take 1,000 mg by mouth daily.   . Testosterone (TESTOPEL) 75 MG PLLT by Implant route.  Marland Kitchen zolpidem (AMBIEN) 5 MG tablet TAKE 0.5-1 TABLETS BY MOUTH AT BEDTIME AS NEEDED USE AS NEEDED FOR SLEEP   No facility-administered encounter medications on file as of 06/06/2017.     Activities of Daily Living In your present state of health, do you have any difficulty performing the following activities: 06/06/2017  Hearing? Y  Vision? N  Difficulty concentrating or  making decisions? N  Walking or climbing stairs? N  Dressing or bathing? N  Doing errands, shopping? N  Preparing Food and eating ? N  Using the Toilet? N  In the past six months, have you accidently leaked urine? N  Do you have problems with loss of bowel control? N  Managing your Medications? N  Managing your Finances? N  Housekeeping or managing your Housekeeping? N  Some recent data might be hidden    Patient Care Team: [name], MD as PCP - General (Internal Medicine)   Assessment:   This is a routine wellness examination for Tony Luna. The goal of the wellness visit is to assist the patient how to close the gaps in care and create a preventative care plan for the patient.   The roster of all physicians providing medical care to patient is listed in the Snapshot section of the chart.  Taking calcium VIT D as appropriate/Osteoporosis reviewed.    Safety issues reviewed; Smoke and carbon monoxide detectors in the home.Firearms locked up in the home. Wears seatbelts when driving or riding with others. Patient does wear sunscreen or protective clothing when in direct sunlight. No violence in the home.  Depression- PHQ 2 &9 complete.  No signs/symptoms or verbal communication regarding little pleasure in doing things, feeling down, depressed or hopeless. No changes in sleeping, energy, eating, concentrating.  No thoughts of self harm or harm towards others.  Time spent on this topic is 8 minutes.   Patient is  alert, normal appearance, oriented to person/place/and time. Correctly identified the president of the [place], recall of 3/3 words, and performing simple calculations. Displays appropriate judgement and can read correct time from watch face.   No new identified risk were noted.  No failures at ADL's or IADL's.    BMI- discussed the importance of a healthy diet, water intake and the benefits of aerobic exercise. Educational material provided.   24 hour diet recall: Regular diet  Daily fluid intake: 2-3 cups of caffeine, 6 cups of water  Dental- every 6 months.  Eye- Visual acuity not assessed per patient preference since they have regular follow up with the ophthalmologist.  Wears corrective lenses.  Sleep patterns- Sleeps 6 hours at night.  Wakes feeling rested. CPAP mouthpiece in use.  TDAP administered L deltoid, tolerated well. Educational material provided.  POCT A1cHgb completed per patient request; results  WNL.    Health maintenance gaps- closed.  Patient Concerns: None at this time. Follow up with PCP as needed.  Exercise Activities and Dietary recommendations Current Exercise Habits: Home exercise routine, Type of exercise: calisthenics;strength training/weights;stretching, Frequency (Times/Week): 6, Intensity: Moderate  Goals    . Exercise 150 minutes per week (moderate activity)       Fall Risk Fall Risk  06/06/2017 05/29/2016 04/13/2015  Falls in the past year? No No No    Depression Screen PHQ 2/9 Scores 06/06/2017 05/29/2016 04/13/2015  PHQ - 2 Score 0 0 0  PHQ- 9 Score 0 - -    Cognitive Function     6CIT Screen 06/06/2017 05/29/2016  What Year? 0 points 0 points  What month? 0 points 0 points  What time? 0 points 0 points  Count back from 20 0 points 0 points  Months in reverse 0 points 0 points  Repeat phrase 0 points 0 points  Total Score 0 0    Immunization History  Administered Date(s) Administered  . Hep A / Hep B 12/13/2015, 01/17/2016,  06/26/2016  .  Influenza Whole 02/26/2008  . Influenza,inj,Quad PF,6+ Mos 03/12/2013, 02/24/2014  . Influenza-Unspecified 02/09/2016  . Pneumococcal Conjugate-13 08/06/2013  . Pneumococcal Polysaccharide-23 05/24/2011  . Td 05/28/2004  . Tdap 02/13/2006, 06/06/2017  . Zoster 05/30/2007    Screening Tests Health Maintenance  Topic Date Due  . INFLUENZA VACCINE  09/15/2017 (Originally 12/26/2016)  . COLONOSCOPY  12/07/2020  . TETANUS/TDAP  06/07/2027  . PNA vac Low Risk Adult  Completed      Plan:    End of life planning; Advance aging; Advanced directives discussed. Copy of current HCPOA/Living Will on file.    I have personally reviewed and noted the following in the patient's chart:   . Medical and social history . Use of alcohol, tobacco or illicit drugs  . Current medications and supplements . Functional ability and status . Nutritional status . Physical activity . Advanced directives . List of other physicians . Hospitalizations, surgeries, and ER visits in previous 12 months . Vitals . Screenings to include cognitive, depression, and falls . Referrals and appointments  In addition, I have reviewed and discussed with patient certain preventive protocols, quality metrics, and best practice recommendations. A written personalized care plan for preventive services as well as general preventive health recommendations were provided to patient.     OBrien-Blaney, Vondra Aldredge L, LPN  09/08/2438   I have reviewed the above information and agree with above.   Deborra Medina, MD

## 2017-06-06 NOTE — Patient Instructions (Addendum)
  Tony Luna , Thank you for taking time to come for your Medicare Wellness Visit. I appreciate your ongoing commitment to your health goals. Please review the following plan we discussed and let me know if I can assist you in the future.   Follow up as needed.  These are the goals we discussed: Goals    . Exercise 150 minutes per week (moderate activity)       This is a list of the screening recommended for you and due dates:  Health Maintenance  Topic Date Due  . Tetanus Vaccine  02/14/2016  . Flu Shot  09/15/2017*  . Colon Cancer Screening  12/07/2020  . Pneumonia vaccines  Completed  *Topic was postponed. The date shown is not the original due date.

## 2017-06-12 ENCOUNTER — Ambulatory Visit: Payer: PPO | Admitting: Orthotics

## 2017-06-12 DIAGNOSIS — M722 Plantar fascial fibromatosis: Secondary | ICD-10-CM

## 2017-06-12 DIAGNOSIS — M2141 Flat foot [pes planus] (acquired), right foot: Secondary | ICD-10-CM

## 2017-06-12 DIAGNOSIS — M25562 Pain in left knee: Secondary | ICD-10-CM | POA: Diagnosis not present

## 2017-06-12 DIAGNOSIS — M25511 Pain in right shoulder: Secondary | ICD-10-CM | POA: Diagnosis not present

## 2017-06-12 DIAGNOSIS — M5137 Other intervertebral disc degeneration, lumbosacral region: Secondary | ICD-10-CM | POA: Diagnosis not present

## 2017-06-12 DIAGNOSIS — M7741 Metatarsalgia, right foot: Secondary | ICD-10-CM | POA: Diagnosis not present

## 2017-06-12 DIAGNOSIS — M2142 Flat foot [pes planus] (acquired), left foot: Secondary | ICD-10-CM

## 2017-06-12 DIAGNOSIS — M7742 Metatarsalgia, left foot: Secondary | ICD-10-CM | POA: Diagnosis not present

## 2017-06-12 DIAGNOSIS — M2140 Flat foot [pes planus] (acquired), unspecified foot: Secondary | ICD-10-CM | POA: Diagnosis not present

## 2017-06-12 NOTE — Progress Notes (Signed)
Patient came in to pick up foot orthotics (4 pair); he seemed very pleased with fit and function.    Paid $900

## 2017-06-24 ENCOUNTER — Telehealth: Payer: Self-pay | Admitting: Internal Medicine

## 2017-06-24 NOTE — Telephone Encounter (Signed)
Patient calling again because he is in need of a refill on his Tamsulosin. Patient stated that he is no longer seeing the Urologist anymore and that Dr.Tullo was the last person to refill this medication. Stated on 02-22-2017 He would like to have it sent to [facility]

## 2017-06-24 NOTE — Telephone Encounter (Signed)
Copied from [place]. Topic: Quick Communication - Rx Refill/Question >> Jun 24, 2017  8:53 AM Ether Griffins B wrote: Medication: Tamsulosin   Has the patient contacted their pharmacy? Yes.     (Agent: If no, request that the patient contact the pharmacy for the refill.)   Preferred Pharmacy (with phone number or street name): [id number] [place], [place]: Please be advised that RX refills may take up to 3 business days. We ask that you follow-up with your pharmacy.

## 2017-06-24 NOTE — Telephone Encounter (Signed)
Rx  Was  Prescribed  By  His  Urologist   Message   Left  On  Patients  Answering  Machine

## 2017-06-25 MED ORDER — TAMSULOSIN HCL 0.4 MG PO CAPS: ORAL_CAPSULE | ORAL | 3 refills | Status: DC

## 2017-06-25 NOTE — Telephone Encounter (Signed)
Please advise 

## 2017-06-25 NOTE — Telephone Encounter (Signed)
Looks like the last time this was filled was on 03/18/2017 by his urologist and sent to [place] [facility]. Is it okay to refill since the pt states he is no longer seeing the urologist? [facility] [place].

## 2017-06-25 NOTE — Telephone Encounter (Signed)
Yes you can always fill bladder /prostate medications , but check with me re testosterone .  I have taken care of it.

## 2017-06-25 NOTE — Addendum Note (Signed)
Addended by: [name] on: 06/25/2017 11:46 AM   Modules accepted: Orders

## 2017-07-03 DIAGNOSIS — Z862 Personal history of diseases of the blood and blood-forming organs and certain disorders involving the immune mechanism: Secondary | ICD-10-CM | POA: Diagnosis not present

## 2017-07-03 DIAGNOSIS — Z8719 Personal history of other diseases of the digestive system: Secondary | ICD-10-CM | POA: Diagnosis not present

## 2017-07-03 DIAGNOSIS — Z125 Encounter for screening for malignant neoplasm of prostate: Secondary | ICD-10-CM | POA: Diagnosis not present

## 2017-07-03 DIAGNOSIS — Z136 Encounter for screening for cardiovascular disorders: Secondary | ICD-10-CM | POA: Diagnosis not present

## 2017-07-03 DIAGNOSIS — R7989 Other specified abnormal findings of blood chemistry: Secondary | ICD-10-CM | POA: Diagnosis not present

## 2017-07-03 DIAGNOSIS — Z0000 Encounter for general adult medical examination without abnormal findings: Secondary | ICD-10-CM | POA: Diagnosis not present

## 2017-07-03 DIAGNOSIS — E039 Hypothyroidism, unspecified: Secondary | ICD-10-CM | POA: Diagnosis not present

## 2017-07-05 DIAGNOSIS — E291 Testicular hypofunction: Secondary | ICD-10-CM | POA: Diagnosis not present

## 2017-07-05 DIAGNOSIS — G479 Sleep disorder, unspecified: Secondary | ICD-10-CM | POA: Diagnosis not present

## 2017-07-05 DIAGNOSIS — N529 Male erectile dysfunction, unspecified: Secondary | ICD-10-CM | POA: Diagnosis not present

## 2017-07-05 DIAGNOSIS — R6882 Decreased libido: Secondary | ICD-10-CM | POA: Diagnosis not present

## 2017-07-08 ENCOUNTER — Encounter: Payer: Self-pay | Admitting: General Surgery

## 2017-07-08 ENCOUNTER — Telehealth: Payer: Self-pay | Admitting: Internal Medicine

## 2017-07-08 NOTE — Telephone Encounter (Signed)
Pt came in and drop off his lab results he stated he wanted you take a look at them. Labs are in folder up front.

## 2017-07-08 NOTE — Telephone Encounter (Signed)
Placed in yellow results folder.  °

## 2017-07-13 ENCOUNTER — Telehealth: Payer: Self-pay | Admitting: Internal Medicine

## 2017-07-13 NOTE — Telephone Encounter (Signed)
MyChart message sent re [facility] labs and CPE.  Advised to reduce vitamin D supplementation and refrain from all regular alcohol use.

## 2017-07-17 DIAGNOSIS — M2140 Flat foot [pes planus] (acquired), unspecified foot: Secondary | ICD-10-CM | POA: Diagnosis not present

## 2017-07-17 DIAGNOSIS — M7741 Metatarsalgia, right foot: Secondary | ICD-10-CM | POA: Diagnosis not present

## 2017-07-17 DIAGNOSIS — M5137 Other intervertebral disc degeneration, lumbosacral region: Secondary | ICD-10-CM | POA: Diagnosis not present

## 2017-07-17 DIAGNOSIS — M25562 Pain in left knee: Secondary | ICD-10-CM | POA: Diagnosis not present

## 2017-07-17 DIAGNOSIS — M7742 Metatarsalgia, left foot: Secondary | ICD-10-CM | POA: Diagnosis not present

## 2017-08-07 DIAGNOSIS — M5137 Other intervertebral disc degeneration, lumbosacral region: Secondary | ICD-10-CM | POA: Diagnosis not present

## 2017-08-07 DIAGNOSIS — M25562 Pain in left knee: Secondary | ICD-10-CM | POA: Diagnosis not present

## 2017-08-07 DIAGNOSIS — M2140 Flat foot [pes planus] (acquired), unspecified foot: Secondary | ICD-10-CM | POA: Diagnosis not present

## 2017-08-07 DIAGNOSIS — M7742 Metatarsalgia, left foot: Secondary | ICD-10-CM | POA: Diagnosis not present

## 2017-08-07 DIAGNOSIS — M7741 Metatarsalgia, right foot: Secondary | ICD-10-CM | POA: Diagnosis not present

## 2017-08-20 ENCOUNTER — Telehealth: Payer: Self-pay

## 2017-08-20 NOTE — Telephone Encounter (Signed)
Per pt insurance company cialis 5mg  is available without needing a PA.

## 2017-09-19 DIAGNOSIS — X32XXXA Exposure to sunlight, initial encounter: Secondary | ICD-10-CM | POA: Diagnosis not present

## 2017-09-19 DIAGNOSIS — D2272 Melanocytic nevi of left lower limb, including hip: Secondary | ICD-10-CM | POA: Diagnosis not present

## 2017-09-19 DIAGNOSIS — M5137 Other intervertebral disc degeneration, lumbosacral region: Secondary | ICD-10-CM | POA: Diagnosis not present

## 2017-09-19 DIAGNOSIS — M25562 Pain in left knee: Secondary | ICD-10-CM | POA: Diagnosis not present

## 2017-09-19 DIAGNOSIS — M7741 Metatarsalgia, right foot: Secondary | ICD-10-CM | POA: Diagnosis not present

## 2017-09-19 DIAGNOSIS — D2261 Melanocytic nevi of right upper limb, including shoulder: Secondary | ICD-10-CM | POA: Diagnosis not present

## 2017-09-19 DIAGNOSIS — L821 Other seborrheic keratosis: Secondary | ICD-10-CM | POA: Diagnosis not present

## 2017-09-19 DIAGNOSIS — M2140 Flat foot [pes planus] (acquired), unspecified foot: Secondary | ICD-10-CM | POA: Diagnosis not present

## 2017-09-19 DIAGNOSIS — D225 Melanocytic nevi of trunk: Secondary | ICD-10-CM | POA: Diagnosis not present

## 2017-09-19 DIAGNOSIS — L570 Actinic keratosis: Secondary | ICD-10-CM | POA: Diagnosis not present

## 2017-09-19 DIAGNOSIS — D2271 Melanocytic nevi of right lower limb, including hip: Secondary | ICD-10-CM | POA: Diagnosis not present

## 2017-09-19 DIAGNOSIS — Z85828 Personal history of other malignant neoplasm of skin: Secondary | ICD-10-CM | POA: Diagnosis not present

## 2017-09-19 DIAGNOSIS — M7742 Metatarsalgia, left foot: Secondary | ICD-10-CM | POA: Diagnosis not present

## 2017-09-19 DIAGNOSIS — D2262 Melanocytic nevi of left upper limb, including shoulder: Secondary | ICD-10-CM | POA: Diagnosis not present

## 2017-09-19 DIAGNOSIS — Z08 Encounter for follow-up examination after completed treatment for malignant neoplasm: Secondary | ICD-10-CM | POA: Diagnosis not present

## 2017-10-09 ENCOUNTER — Other Ambulatory Visit: Payer: Self-pay | Admitting: Internal Medicine

## 2017-10-10 NOTE — Telephone Encounter (Signed)
Printed, signed and faxed.  

## 2017-10-10 NOTE — Telephone Encounter (Signed)
Refilled: 04/22/2017 Last OV: 04/22/2017 Next OV: 04/23/2018

## 2017-10-23 DIAGNOSIS — G479 Sleep disorder, unspecified: Secondary | ICD-10-CM | POA: Diagnosis not present

## 2017-10-23 DIAGNOSIS — M2550 Pain in unspecified joint: Secondary | ICD-10-CM | POA: Diagnosis not present

## 2017-10-23 DIAGNOSIS — E291 Testicular hypofunction: Secondary | ICD-10-CM | POA: Diagnosis not present

## 2017-10-23 DIAGNOSIS — R6882 Decreased libido: Secondary | ICD-10-CM | POA: Diagnosis not present

## 2017-10-24 DIAGNOSIS — M7742 Metatarsalgia, left foot: Secondary | ICD-10-CM | POA: Diagnosis not present

## 2017-10-24 DIAGNOSIS — M25562 Pain in left knee: Secondary | ICD-10-CM | POA: Diagnosis not present

## 2017-10-24 DIAGNOSIS — M5137 Other intervertebral disc degeneration, lumbosacral region: Secondary | ICD-10-CM | POA: Diagnosis not present

## 2017-10-24 DIAGNOSIS — M7741 Metatarsalgia, right foot: Secondary | ICD-10-CM | POA: Diagnosis not present

## 2017-10-24 DIAGNOSIS — M2140 Flat foot [pes planus] (acquired), unspecified foot: Secondary | ICD-10-CM | POA: Diagnosis not present

## 2017-11-11 ENCOUNTER — Telehealth: Payer: Self-pay | Admitting: Internal Medicine

## 2017-11-11 NOTE — Telephone Encounter (Signed)
Pt dropped off an envelope with labs for her to review. Pt does not need them back. Envelope is up front in colored folder.

## 2017-11-12 NOTE — Telephone Encounter (Signed)
Placed in yellow folder.  

## 2017-11-18 ENCOUNTER — Other Ambulatory Visit: Payer: Self-pay | Admitting: Internal Medicine

## 2017-11-18 DIAGNOSIS — R7301 Impaired fasting glucose: Secondary | ICD-10-CM

## 2017-11-18 DIAGNOSIS — E349 Endocrine disorder, unspecified: Secondary | ICD-10-CM

## 2017-11-18 DIAGNOSIS — K7460 Unspecified cirrhosis of liver: Secondary | ICD-10-CM

## 2017-11-18 DIAGNOSIS — M810 Age-related osteoporosis without current pathological fracture: Secondary | ICD-10-CM

## 2017-11-18 DIAGNOSIS — D696 Thrombocytopenia, unspecified: Secondary | ICD-10-CM

## 2017-11-18 NOTE — Progress Notes (Signed)
mp

## 2017-11-18 NOTE — Telephone Encounter (Signed)
Labs and notes from nutritionist reviewed  .  I have ordered fasting labs for November to be done at [facility].

## 2017-11-19 NOTE — Telephone Encounter (Signed)
LMTCB. PEC may speak with pt.  

## 2017-11-20 DIAGNOSIS — M25511 Pain in right shoulder: Secondary | ICD-10-CM | POA: Diagnosis not present

## 2017-11-20 NOTE — Telephone Encounter (Signed)
I will not order ALL OF  The tests that he requests,  For two reasons  1)  many of the labs you get are not standard labs that I think should be checked  2) quarterly testing is excessive   I am willing to order tsh, cmet, vitamin d, lipid , a1c and testosterone every 6 months

## 2017-11-20 NOTE — Telephone Encounter (Signed)
Pt states he may not have expressed his request exactly as he wants. Pt wants an blanket order for quarterly labs, starting now, June, then Sept then another right before his Nov appt.  Stating "approximatley every 3 months, beginning immediatly"  Pt states the order should be for the test that he continues to have at [facility], and Dr Derrel Nip has the copy of the ones they do he wants.  Pt states he can come by and pick up the order if the dr will write.  Call cell number [phone]

## 2017-11-20 NOTE — Telephone Encounter (Signed)
Please advise 

## 2017-11-22 NOTE — Telephone Encounter (Signed)
LMTCB. PEC may speak with pt.  

## 2017-11-29 NOTE — Telephone Encounter (Signed)
Yes you can order the lab

## 2017-11-29 NOTE — Telephone Encounter (Signed)
Is it okay to order labs? Pt stated that it has been more than six months since he has had labs done.

## 2017-11-29 NOTE — Telephone Encounter (Signed)
Pt. Given Dr. Lupita Dawn recommendations. States it has been longer than 6 months since he had these test - would like to have them now. Please advise pt.

## 2017-12-03 DIAGNOSIS — M2140 Flat foot [pes planus] (acquired), unspecified foot: Secondary | ICD-10-CM | POA: Diagnosis not present

## 2017-12-03 DIAGNOSIS — M5137 Other intervertebral disc degeneration, lumbosacral region: Secondary | ICD-10-CM | POA: Diagnosis not present

## 2017-12-03 DIAGNOSIS — M7741 Metatarsalgia, right foot: Secondary | ICD-10-CM | POA: Diagnosis not present

## 2017-12-03 DIAGNOSIS — M7742 Metatarsalgia, left foot: Secondary | ICD-10-CM | POA: Diagnosis not present

## 2017-12-03 DIAGNOSIS — M25562 Pain in left knee: Secondary | ICD-10-CM | POA: Diagnosis not present

## 2017-12-04 ENCOUNTER — Telehealth: Payer: Self-pay

## 2017-12-04 DIAGNOSIS — W57XXXA Bitten or stung by nonvenomous insect and other nonvenomous arthropods, initial encounter: Secondary | ICD-10-CM | POA: Diagnosis not present

## 2017-12-04 DIAGNOSIS — S30861A Insect bite (nonvenomous) of abdominal wall, initial encounter: Secondary | ICD-10-CM | POA: Diagnosis not present

## 2017-12-04 NOTE — Telephone Encounter (Signed)
Copied from [place] [phone]. Topic: Inquiry >> Dec 04, 2017  1:19 PM [name] wrote: Reason for CRM: Patient states that he has a tick bite, and he needs Doxycycline today asap. Patient wants to know if Dr. Derrel Nip would call in a prescription for Doxycycline today. Please call the patient as soon as you can. Patient states that he is coming by to pickup his lab orders also.     Thank You!!!

## 2017-12-04 NOTE — Telephone Encounter (Signed)
Labs have been printed off and given to pt to have done at [facility].

## 2017-12-04 NOTE — Telephone Encounter (Signed)
Pt went to urgent care.

## 2018-01-01 DIAGNOSIS — M25511 Pain in right shoulder: Secondary | ICD-10-CM | POA: Diagnosis not present

## 2018-01-08 ENCOUNTER — Telehealth: Payer: Self-pay | Admitting: Internal Medicine

## 2018-01-08 DIAGNOSIS — Z0279 Encounter for issue of other medical certificate: Secondary | ICD-10-CM

## 2018-01-08 NOTE — Telephone Encounter (Signed)
Medical evaluation insurance form to be filled out by physician. Given to Tullo's CMA.

## 2018-01-08 NOTE — Telephone Encounter (Signed)
Form has been placed in red folder.  

## 2018-01-09 ENCOUNTER — Telehealth: Payer: Self-pay | Admitting: Internal Medicine

## 2018-01-09 DIAGNOSIS — M25562 Pain in left knee: Secondary | ICD-10-CM | POA: Diagnosis not present

## 2018-01-09 DIAGNOSIS — M7742 Metatarsalgia, left foot: Secondary | ICD-10-CM | POA: Diagnosis not present

## 2018-01-09 DIAGNOSIS — M5137 Other intervertebral disc degeneration, lumbosacral region: Secondary | ICD-10-CM | POA: Diagnosis not present

## 2018-01-09 DIAGNOSIS — M2140 Flat foot [pes planus] (acquired), unspecified foot: Secondary | ICD-10-CM | POA: Diagnosis not present

## 2018-01-09 NOTE — Telephone Encounter (Signed)
Completed form has been returned to [place] in red folder

## 2018-01-09 NOTE — Telephone Encounter (Signed)
Form for medical insurance completed and returned in red folder

## 2018-01-09 NOTE — Telephone Encounter (Signed)
Form has been mailed as requested by pt.

## 2018-01-15 NOTE — Telephone Encounter (Signed)
faxed

## 2018-02-17 ENCOUNTER — Other Ambulatory Visit: Payer: Self-pay | Admitting: Internal Medicine

## 2018-02-17 DIAGNOSIS — G4701 Insomnia due to medical condition: Secondary | ICD-10-CM | POA: Diagnosis not present

## 2018-02-17 DIAGNOSIS — M02819 Other reactive arthropathies, unspecified shoulder: Secondary | ICD-10-CM | POA: Diagnosis not present

## 2018-02-17 LAB — IRON,TIBC AND FERRITIN PANEL
Ferritin: 248
Iron: 156
UIBC: 163

## 2018-02-17 LAB — LIPID PANEL
Cholesterol: 190 (ref 0–200)
HDL: 92 — AB (ref 35–70)
LDL Cholesterol: 91
Triglycerides: 34 — AB (ref 40–160)

## 2018-02-17 LAB — VITAMIN D 25 HYDROXY (VIT D DEFICIENCY, FRACTURES): Vit D, 25-Hydroxy: 50.2

## 2018-02-17 LAB — TSH: TSH: 2.37 (ref 0.41–5.90)

## 2018-02-17 LAB — CBC AND DIFFERENTIAL
HCT: 44 (ref 41–53)
Hemoglobin: 15.7 (ref 13.5–17.5)
Neutrophils Absolute: 3
Platelets: 130 — AB (ref 150–399)
WBC: 3.9

## 2018-02-17 LAB — PSA: PSA: 1.7

## 2018-02-17 LAB — HEMOGLOBIN A1C: Hemoglobin A1C: 4.9 % (ref 4.0–5.6)

## 2018-02-26 DIAGNOSIS — M5137 Other intervertebral disc degeneration, lumbosacral region: Secondary | ICD-10-CM | POA: Diagnosis not present

## 2018-02-26 DIAGNOSIS — M7742 Metatarsalgia, left foot: Secondary | ICD-10-CM | POA: Diagnosis not present

## 2018-02-26 DIAGNOSIS — M25562 Pain in left knee: Secondary | ICD-10-CM | POA: Diagnosis not present

## 2018-02-26 DIAGNOSIS — M2140 Flat foot [pes planus] (acquired), unspecified foot: Secondary | ICD-10-CM | POA: Diagnosis not present

## 2018-03-02 ENCOUNTER — Telehealth: Payer: Self-pay | Admitting: Internal Medicine

## 2018-03-12 DIAGNOSIS — M2550 Pain in unspecified joint: Secondary | ICD-10-CM | POA: Diagnosis not present

## 2018-03-12 DIAGNOSIS — E291 Testicular hypofunction: Secondary | ICD-10-CM | POA: Diagnosis not present

## 2018-03-12 DIAGNOSIS — R6882 Decreased libido: Secondary | ICD-10-CM | POA: Diagnosis not present

## 2018-03-20 ENCOUNTER — Ambulatory Visit: Payer: Self-pay | Admitting: Urology

## 2018-03-21 DIAGNOSIS — D485 Neoplasm of uncertain behavior of skin: Secondary | ICD-10-CM | POA: Diagnosis not present

## 2018-03-21 DIAGNOSIS — L821 Other seborrheic keratosis: Secondary | ICD-10-CM | POA: Diagnosis not present

## 2018-03-21 DIAGNOSIS — X32XXXA Exposure to sunlight, initial encounter: Secondary | ICD-10-CM | POA: Diagnosis not present

## 2018-03-21 DIAGNOSIS — L570 Actinic keratosis: Secondary | ICD-10-CM | POA: Diagnosis not present

## 2018-03-21 DIAGNOSIS — D210 Benign neoplasm of connective and other soft tissue of head, face and neck: Secondary | ICD-10-CM | POA: Diagnosis not present

## 2018-03-21 DIAGNOSIS — Z08 Encounter for follow-up examination after completed treatment for malignant neoplasm: Secondary | ICD-10-CM | POA: Diagnosis not present

## 2018-03-21 DIAGNOSIS — Z85828 Personal history of other malignant neoplasm of skin: Secondary | ICD-10-CM | POA: Diagnosis not present

## 2018-03-24 DIAGNOSIS — E348 Other specified endocrine disorders: Secondary | ICD-10-CM | POA: Diagnosis not present

## 2018-03-24 DIAGNOSIS — E291 Testicular hypofunction: Secondary | ICD-10-CM | POA: Diagnosis not present

## 2018-04-02 ENCOUNTER — Encounter: Payer: Self-pay | Admitting: Urology

## 2018-04-02 ENCOUNTER — Ambulatory Visit: Payer: PPO | Admitting: Urology

## 2018-04-02 VITALS — BP 156/75 | HR 89 | Ht 72.0 in | Wt 170.0 lb

## 2018-04-02 DIAGNOSIS — N529 Male erectile dysfunction, unspecified: Secondary | ICD-10-CM

## 2018-04-02 DIAGNOSIS — E291 Testicular hypofunction: Secondary | ICD-10-CM | POA: Diagnosis not present

## 2018-04-02 DIAGNOSIS — N401 Enlarged prostate with lower urinary tract symptoms: Secondary | ICD-10-CM

## 2018-04-02 DIAGNOSIS — N138 Other obstructive and reflux uropathy: Secondary | ICD-10-CM | POA: Diagnosis not present

## 2018-04-02 MED ORDER — TADALAFIL 20 MG PO TABS: 20.0000 mg | ORAL_TABLET | Freq: Every day | ORAL | 3 refills | Status: DC | PRN

## 2018-04-02 NOTE — Progress Notes (Signed)
04/02/2018 1:37 PM   Tony Luna 06-Jan-1941 [id number]  Referring provider: [name], MD [place], Tony Luna [id number]  Chief Complaint  Patient presents with  . Benign Prostatic Hypertrophy    HPI: 77 year old male presents for annual follow-up of BPH, erectile dysfunction and hypogonadism.  He continues to receive subcutaneous testosterone pellets from a clinic in [facility] [place].  He was using tadalafil 5 mg for ED and would like to increase to 20 mg tablets.  He had blood work drawn in late September 2019.  His testosterone level was 868, PSA stable at 1.7, hematocrit 43.7.  He denies bothersome lower urinary tract symptoms, breast tenderness/enlargement or lower extremity edema.  He denies dysuria or gross hematuria.  He remains on tamsulosin daily.   PMH: Past Medical History:  Diagnosis Date  . Allergy   . Arthritis   . Complication of anesthesia    :"need catheter"  . Dysrhythmia    ? something told by reg dr  unable to say what it was  . Esophageal tear   . Hypothyroidism   . Pneumonia    hx  . 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 Rotator cuff tear     Surgical History: Past Surgical History:  Procedure Laterality Date  . blepharaplasty    . CAUTERY OF TURBINATES    . COLONOSCOPY WITH PROPOFOL N/A 07/19/2015   Procedure: COLONOSCOPY WITH PROPOFOL;  Surgeon: Tony Lame, MD;  Location: [facility];  Service: Endoscopy;  Laterality: N/A;  . HERNIA REPAIR Left    inguinal herniorrhapy  . KNEE SURGERY     ? loose body/ chondral defect 12yrs  . ROTATOR CUFF REPAIR     left  . SPINE SURGERY    . TONSILLECTOMY    . TOTAL KNEE ARTHROPLASTY Left 06/22/2014   Procedure: TOTAL KNEE ARTHROPLASTY;  Surgeon: Tony Bridge, MD;  Location: [facility];  Service: Orthopedics;  Laterality: Left;    Home Medications:  Allergies as of 04/02/2018   No Known Allergies     Medication List        Accurate as of 04/02/18  1:37 PM.  Always use your most recent med list.          Bitter Melon Powd Take 1 tablet by mouth daily.   levothyroxine 75 MCG tablet Commonly known as:  SYNTHROID, LEVOTHROID Take by mouth.   meloxicam 15 MG tablet Commonly known as:  MOBIC Take 15 mg by mouth daily as needed.   MILK THISTLE PO Take 1 tablet by mouth 2 (two) times daily.   multivitamin with minerals Tabs tablet Take 1 tablet by mouth 2 (two) times daily.   OVER THE COUNTER MEDICATION Take 3,000 mg by mouth daily. Omega Cure   OVER THE COUNTER MEDICATION Take 250 mg by mouth daily. Bio Magnesium Citrate   OVER THE COUNTER MEDICATION Take 20,000 mcg by mouth daily. Vitamin B12-methylfolate   OVER THE COUNTER MEDICATION Take 1 tablet by mouth daily. Pure adrenal 400   OVER THE COUNTER MEDICATION Take 800 mcg by mouth daily. Active B Folate   OVER THE COUNTER MEDICATION Inject 50 Units as directed daily. Semorelin   OVER THE COUNTER MEDICATION 2 tablets daily. VitaPrime   OVER THE COUNTER MEDICATION Take 4 sprays by mouth at bedtime. Secretropin oral spray - growth hormone   OVER THE COUNTER MEDICATION Take 1 capsule by mouth 3 (three) times daily. Instaflex   OVER THE COUNTER MEDICATION Take 1,000  mg by mouth daily. Turmeric with Meriva   OVER THE COUNTER MEDICATION 1,000 mg daily. Super Bio-C buffered   OVER THE COUNTER MEDICATION 1,000 mg daily. L'Arginine   OVER THE COUNTER MEDICATION 2 capsules 2 (two) times daily. SLF Forte   OVER THE COUNTER MEDICATION 1,000 mg. Myomin   sildenafil 100 MG tablet Commonly known as:  VIAGRA Take 1 tablet (100 mg total) by mouth as needed for erectile dysfunction.   tadalafil 5 MG tablet Commonly known as:  CIALIS Take 1 tablet (5 mg total) by mouth daily as needed for erectile dysfunction.   tamsulosin 0.4 MG Caps capsule Commonly known as:  FLOMAX TAKE 1 CAPSULE BY MOUTH DAILY FOR URINATION   TAURINE PO Take 1,000 mg by mouth daily.   TESTOPEL  75 MG Pllt Generic drug:  Testosterone by Implant route.   VITAMIN D-3 PO Take 5,000 Units by mouth daily. 5,00000 u in the morning, 2,000 u in the evening   Vitamin D3 100000 UNIT/GM Powd Take by mouth.   zolpidem 5 MG tablet Commonly known as:  AMBIEN TAKE 1/2 TO 1 TABLET BY MOUTH AT BEDTIMEAS NEEDED FOR SLEEP       Allergies: No Known Allergies  Family History: Family History  Problem Relation Age of Onset  . Alcohol abuse Mother   . Diabetes Mother   . Mental retardation Mother     Social History:  reports that he quit smoking about 34 years ago. His smoking use included cigarettes. He has a 15.00 pack-year smoking history. His smokeless tobacco use includes chew. He reports that he does not drink alcohol or use drugs.  ROS: UROLOGY Frequent Urination?: No Hard to postpone urination?: No Burning/pain with urination?: No Get up at night to urinate?: Yes Leakage of urine?: No Urine stream starts and stops?: No Trouble starting stream?: No Do you have to strain to urinate?: No Blood in urine?: No Urinary tract infection?: No Sexually transmitted disease?: No Injury to kidneys or bladder?: No Painful intercourse?: No Weak stream?: No Erection problems?: No Penile pain?: No  Gastrointestinal Nausea?: No Vomiting?: No Indigestion/heartburn?: No Diarrhea?: No Constipation?: No  Constitutional Fever: No Night sweats?: No Weight loss?: No Fatigue?: No  Skin Skin rash/lesions?: No Itching?: No  Eyes Blurred vision?: No Double vision?: No  Ears/Nose/Throat Sore throat?: No Sinus problems?: No  Hematologic/Lymphatic Swollen glands?: No Easy bruising?: No  Cardiovascular Leg swelling?: No Chest pain?: No  Respiratory Cough?: No Shortness of breath?: No  Endocrine Excessive thirst?: No  Musculoskeletal Back pain?: No Joint pain?: Yes  Neurological Headaches?: No Dizziness?: No  Psychologic Depression?: No Anxiety?: No  Physical  Exam: BP (!) 156/75 (BP Location: Left Arm, Patient Position: Sitting, Cuff Size: Normal)   Pulse 89   Ht 6' (1.829 m)   Wt 170 lb (77.1 kg)   BMI 23.06 kg/m   Constitutional:  Alert and oriented, No acute distress. HEENT: [place] AT, moist mucus membranes.  Trachea midline, no masses. Cardiovascular: No clubbing, cyanosis, or edema. Respiratory: Normal respiratory effort, no increased work of breathing. GI: Abdomen is soft, nontender, nondistended, no abdominal masses GU: No CVA tenderness.  Prostate 50 g, smooth without nodules Lymph: No cervical or inguinal lymphadenopathy. Skin: No rashes, bruises or suspicious lesions. Neurologic: Grossly intact, no focal deficits, moving all 4 extremities. Psychiatric: Normal mood and affect.   Assessment & Plan:   77 year old male with hypogonadism, ED and BPH.  He has stable symptoms and blood work is within acceptable limits.  He requested Rx for 20 mg tadalafil which was sent to his pharmacy.    He will continue annual follow-up.  Abbie Sons, [place] [address], [place], [place] [address] [phone]

## 2018-04-10 ENCOUNTER — Other Ambulatory Visit: Payer: Self-pay

## 2018-04-11 ENCOUNTER — Other Ambulatory Visit: Payer: Self-pay

## 2018-04-16 DIAGNOSIS — D485 Neoplasm of uncertain behavior of skin: Secondary | ICD-10-CM | POA: Diagnosis not present

## 2018-04-16 DIAGNOSIS — M2141 Flat foot [pes planus] (acquired), right foot: Secondary | ICD-10-CM | POA: Diagnosis not present

## 2018-04-21 ENCOUNTER — Other Ambulatory Visit: Payer: Self-pay | Admitting: Internal Medicine

## 2018-04-21 NOTE — Telephone Encounter (Signed)
Refilled: 10/10/2017 Last OV: 04/22/2017 Next OV: 04/23/2018

## 2018-04-23 ENCOUNTER — Encounter: Payer: Self-pay | Admitting: Internal Medicine

## 2018-04-23 ENCOUNTER — Ambulatory Visit (INDEPENDENT_AMBULATORY_CARE_PROVIDER_SITE_OTHER): Payer: PPO | Admitting: Internal Medicine

## 2018-04-23 VITALS — BP 144/76 | HR 73 | Temp 97.3°F | Resp 15 | Ht 72.0 in | Wt 175.6 lb

## 2018-04-23 DIAGNOSIS — M67919 Unspecified disorder of synovium and tendon, unspecified shoulder: Secondary | ICD-10-CM

## 2018-04-23 DIAGNOSIS — F419 Anxiety disorder, unspecified: Secondary | ICD-10-CM | POA: Diagnosis not present

## 2018-04-23 DIAGNOSIS — M719 Bursopathy, unspecified: Secondary | ICD-10-CM | POA: Diagnosis not present

## 2018-04-23 DIAGNOSIS — M810 Age-related osteoporosis without current pathological fracture: Secondary | ICD-10-CM | POA: Diagnosis not present

## 2018-04-23 DIAGNOSIS — K7460 Unspecified cirrhosis of liver: Secondary | ICD-10-CM | POA: Diagnosis not present

## 2018-04-23 DIAGNOSIS — M19071 Primary osteoarthritis, right ankle and foot: Secondary | ICD-10-CM | POA: Diagnosis not present

## 2018-04-23 DIAGNOSIS — R030 Elevated blood-pressure reading, without diagnosis of hypertension: Secondary | ICD-10-CM

## 2018-04-23 DIAGNOSIS — Z23 Encounter for immunization: Secondary | ICD-10-CM

## 2018-04-23 LAB — MICROALBUMIN / CREATININE URINE RATIO
Creatinine,U: 129.1 mg/dL
Microalb Creat Ratio: 2.9 mg/g (ref 0.0–30.0)
Microalb, Ur: 3.7 mg/dL — ABNORMAL HIGH (ref 0.0–1.9)

## 2018-04-23 MED ORDER — TRAMADOL HCL 50 MG PO TABS: 50.0000 mg | ORAL_TABLET | Freq: Three times a day (TID) | ORAL | 0 refills | Status: DC | PRN

## 2018-04-23 NOTE — Patient Instructions (Signed)
If you need additional pain control ,  Avoid tylenol,  And use the tramadol I have prescribed   I will refill the [facility] Maintenance, Male A healthy lifestyle and preventive care is important for your health and wellness. Ask your health care provider about what schedule of regular examinations is right for you. What should I know about weight and diet? Eat a Healthy Diet  Eat plenty of vegetables, fruits, whole grains, low-fat dairy products, and lean protein.  Do not eat a lot of foods high in solid fats, added sugars, or salt.  Maintain a Healthy Weight Regular exercise can help you achieve or maintain a healthy weight. You should:  Do at least 150 minutes of exercise each week. The exercise should increase your heart rate and make you sweat (moderate-intensity exercise).  Do strength-training exercises at least twice a week.  Watch Your Levels of Cholesterol and Blood Lipids  Have your blood tested for lipids and cholesterol every 5 years starting at 75 years of age. If you are at high risk for heart disease, you should start having your blood tested when you are 75 years old. You may need to have your cholesterol levels checked more often if: ? Your lipid or cholesterol levels are high. ? You are older than 75 years of age. ? You are at high risk for heart disease.  What should I know about cancer screening? Many types of cancers can be detected early and may often be prevented. Lung Cancer  You should be screened every year for lung cancer if: ? You are a current smoker who has smoked for at least 30 years. ? You are a former smoker who has quit within the past 15 years.  Talk to your health care provider about your screening options, when you should start screening, and how often you should be screened.  Colorectal Cancer  Routine colorectal cancer screening usually begins at 75 years of age and should be repeated every 5-10 years until you are 75 years old. You  may need to be screened more often if early forms of precancerous polyps or small growths are found. Your health care provider may recommend screening at an earlier age if you have risk factors for colon cancer.  Your health care provider may recommend using home test kits to check for hidden blood in the stool.  A small camera at the end of a tube can be used to examine your colon (sigmoidoscopy or colonoscopy). This checks for the earliest forms of colorectal cancer.  Prostate and Testicular Cancer  Depending on your age and overall health, your health care provider may do certain tests to screen for prostate and testicular cancer.  Talk to your health care provider about any symptoms or concerns you have about testicular or prostate cancer.  Skin Cancer  Check your skin from head to toe regularly.  Tell your health care provider about any new moles or changes in moles, especially if: ? There is a change in a mole's size, shape, or color. ? You have a mole that is larger than a pencil eraser.  Always use sunscreen. Apply sunscreen liberally and repeat throughout the day.  Protect yourself by wearing long sleeves, pants, a wide-brimmed hat, and sunglasses when outside.  What should I know about heart disease, diabetes, and high blood pressure?  If you are 68-2 years of age, have your blood pressure checked every 3-5 years. If you are 58 years of age or older, have  your blood pressure checked every year. You should have your blood pressure measured twice-once when you are at a hospital or clinic, and once when you are not at a hospital or clinic. Record the average of the two measurements. To check your blood pressure when you are not at a hospital or clinic, you can use: ? An automated blood pressure machine at a pharmacy. ? A home blood pressure monitor.  Talk to your health care provider about your target blood pressure.  If you are between 53-29 years old, ask your health care  provider if you should take aspirin to prevent heart disease.  Have regular diabetes screenings by checking your fasting blood sugar level. ? If you are at a normal weight and have a low risk for diabetes, have this test once every three years after the age of 74. ? If you are overweight and have a high risk for diabetes, consider being tested at a younger age or more often.  A one-time screening for abdominal aortic aneurysm (AAA) by ultrasound is recommended for men aged 44-75 years who are current or former smokers. What should I know about preventing infection? Hepatitis B If you have a higher risk for hepatitis B, you should be screened for this virus. Talk with your health care provider to find out if you are at risk for hepatitis B infection. Hepatitis C Blood testing is recommended for:  Everyone born from [birth year] through 1963.  Anyone with known risk factors for hepatitis C.  Sexually Transmitted Diseases (STDs)  You should be screened each year for STDs including gonorrhea and chlamydia if: ? You are sexually active and are younger than 75 years of age. ? You are older than 75 years of age and your health care provider tells you that you are at risk for this type of infection. ? Your sexual activity has changed since you were last screened and you are at an increased risk for chlamydia or gonorrhea. Ask your health care provider if you are at risk.  Talk with your health care provider about whether you are at high risk of being infected with HIV. Your health care provider may recommend a prescription medicine to help prevent HIV infection.  What else can I do?  Schedule regular health, dental, and eye exams.  Stay current with your vaccines (immunizations).  Do not use any tobacco products, such as cigarettes, chewing tobacco, and e-cigarettes. If you need help quitting, ask your health care provider.  Limit alcohol intake to no more than 2 drinks per day. One drink equals 12  ounces of beer, 5 ounces of wine, or 1 ounces of hard liquor.  Do not use street drugs.  Do not share needles.  Ask your health care provider for help if you need support or information about quitting drugs.  Tell your health care provider if you often feel depressed.  Tell your health care provider if you have ever been abused or do not feel safe at home. This information is not intended to replace advice given to you by your health care provider. Make sure you discuss any questions you have with your health care provider. Document Released: 11/10/2007 Document Revised: 01/11/2016 Document Reviewed: 02/15/2015 Elsevier Interactive Patient Education  [employer].

## 2018-04-23 NOTE — Progress Notes (Signed)
Subjective:  Patient ID: Tony Luna, male    DOB: 16-May-1941  Age: 77 y.o. MRN: [id number]  CC: The primary encounter diagnosis was Elevated blood pressure reading in office without diagnosis of hypertension. 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  HPI Tony Luna presents for  follow up on multiple issues.  He has had his comprehensive annual exam [facility]   He feels generally well except for bilateral shoulder pain aggravated by working out. He has had  prior injuries causing  rotator cuff muscle damage.  Still exercising 6 to 8 hours per week. Pain is worse when sleeping and wakes him up if he moves wrong. Using meloxicam prn .  Using ambien prn for early wakenings .  Diet reviewed: he averages a minimum of 80 g protein daily from multiple sources including shakes,  lots of fish and chicken   Hypertension: patient checks blood pressure twice weekly at home.  Readings have been for the most part 120/60 or less.  He has a history of white coat hypertension  Cirrhosis:  He is limiting alcohol use to 4 to 5 drinks per week and avoidl tylenol products.   Outpatient Medications Prior to Visit  Medication Sig Dispense Refill  . Cholecalciferol (VITAMIN D-3 PO) Take 5,000 Units by mouth daily. 5,00000 u in the morning, 2,000 u in the evening    . levothyroxine (SYNTHROID, LEVOTHROID) 75 MCG tablet Take by mouth.    . meloxicam (MOBIC) 15 MG tablet Take 15 mg by mouth daily as needed.     Marland Kitchen MILK THISTLE PO Take 1 tablet by mouth 2 (two) times daily.    . Multiple Vitamin (MULTIVITAMIN WITH MINERALS) TABS tablet Take 1 tablet by mouth 2 (two) times daily.    Marland Kitchen OVER  THE COUNTER MEDICATION Take 4 sprays by mouth at bedtime. Secretropin oral spray - growth hormone    . OVER THE COUNTER MEDICATION Take 1 capsule by mouth 3 (three) times daily. Instaflex    . OVER THE COUNTER MEDICATION Take 1,000 mg by mouth daily. Turmeric with Meriva    . OVER THE COUNTER MEDICATION Take 3,000 mg by mouth daily. Omega Cure    . OVER THE COUNTER MEDICATION Take 250 mg by mouth daily. Bio Magnesium Citrate    . OVER THE COUNTER MEDICATION Take 20,000 mcg by mouth daily. Vitamin B12-methylfolate    . OVER THE COUNTER MEDICATION Take 1 tablet by mouth daily. Pure adrenal 400    . OVER THE COUNTER MEDICATION Take 800 mcg by mouth daily. Active B Folate    . OVER THE COUNTER MEDICATION Inject 50 Units as directed daily. Semorelin    . OVER THE COUNTER MEDICATION 2 tablets daily. VitaPrime    . OVER THE COUNTER MEDICATION 1,000 mg daily. Super Bio-C buffered    . OVER THE COUNTER MEDICATION 1,000 mg daily. L'Arginine    . OVER THE COUNTER MEDICATION 2 capsules 2 (two) times daily. SLF Forte    . OVER THE COUNTER MEDICATION 1,000 mg. Myomin    . sildenafil (VIAGRA) 100 MG tablet Take 1 tablet (100 mg total) by mouth as needed for erectile dysfunction. 10 tablet 5  . tadalafil (ADCIRCA/CIALIS) 20 MG tablet Take 1 tablet (20  mg total) by mouth daily as needed for erectile dysfunction. 20 tablet 3  . tadalafil (CIALIS) 5 MG tablet Take 1 tablet (5 mg total) by mouth daily as needed for erectile dysfunction. 30 tablet 11  . tamsulosin (FLOMAX) 0.4 MG CAPS capsule TAKE 1 CAPSULE BY MOUTH DAILY FOR URINATION 90 capsule 1  . TAURINE PO Take 1,000 mg by mouth daily.     . Testosterone (TESTOPEL) 75 MG PLLT by Implant route.    Marland Kitchen zolpidem (AMBIEN) 5 MG tablet TAKE 1/2 TO 1 TABLET BY MOUTH AT BEDTIMEAS NEEDED FOR SLEEP 30 tablet 0  . Bitter Melon POWD Take 1 tablet by mouth daily.    . Cholecalciferol (VITAMIN D3) 100000 UNIT/GM POWD Take by mouth.     No facility-administered medications  prior to visit.     Review of Systems;  Patient denies headache, fevers, malaise, unintentional weight loss, skin rash, eye pain, sinus congestion and sinus pain, sore throat, dysphagia,  hemoptysis , cough, dyspnea, wheezing, chest pain, palpitations, orthopnea, edema, abdominal pain, nausea, melena, diarrhea, constipation, flank pain, dysuria, hematuria, urinary  Frequency, nocturia, numbness, tingling, seizures,  Focal weakness, Loss of consciousness,  Tremor,depression, anxiety, and suicidal ideation.      Objective:  BP (!) 144/76 (BP Location: Left Arm, Patient Position: Sitting, Cuff Size: Normal)   Pulse 73   Temp (!) 97.3 F (36.3 C) (Oral)   Resp 15   Ht 6' (1.829 m)   Wt 175 lb 9.6 oz (79.7 kg)   SpO2 98%   BMI 23.82 kg/m   BP Readings from Last 3 Encounters:  04/23/18 (!) 144/76  04/02/18 (!) 156/75  06/06/17 138/62    Wt Readings from Last 3 Encounters:  04/23/18 175 lb 9.6 oz (79.7 kg)  04/02/18 170 lb (77.1 kg)  06/06/17 174 lb 12.8 oz (79.3 kg)    General appearance: alert, cooperative and appears stated age Ears: normal TM's and external ear canals both ears Throat: lips, mucosa, and tongue normal; teeth and gums normal Neck: no adenopathy, no carotid bruit, supple, symmetrical, trachea midline and thyroid not enlarged, symmetric, no tenderness/mass/nodules Back: symmetric, no curvature. ROM normal. No CVA tenderness. Lungs: clear to auscultation bilaterally Heart: regular rate and rhythm, S1, S2 normal, no murmur, click, rub or gallop Abdomen: soft, non-tender; bowel sounds normal; no masses,  no organomegaly Pulses: 2+ and symmetric Skin: Skin color, texture, turgor normal. No rashes or lesions Lymph nodes: Cervical, supraclavicular, and axillary nodes normal.  Lab Results  Component Value Date   HGBA1C 4.9 02/17/2018   HGBA1C 5.0 06/06/2017   HGBA1C 4.8 04/22/2017    Lab Results  Component Value Date   CREATININE 1.1 02/15/2017   CREATININE  1.2 09/14/2016   CREATININE 1.1 04/26/2016    Lab Results  Component Value Date   WBC 3.9 02/17/2018   HGB 15.7 02/17/2018   HCT 44 02/17/2018   PLT 130 (A) 02/17/2018   GLUCOSE 123 (H) 06/23/2014   CHOL 190 02/17/2018   TRIG 34 (A) 02/17/2018   HDL 92 (A) 02/17/2018   LDLCALC 91 02/17/2018   ALT 36 02/15/2017   AST 38 02/15/2017   NA 141 02/15/2017   K 4.1 02/15/2017   CL 101 06/23/2014   CREATININE 1.1 02/15/2017   BUN 22 (A) 02/15/2017   CO2 25 06/23/2014   TSH 2.37 02/17/2018   PSA 1.7 02/17/2018   INR 1.08 06/14/2014   HGBA1C 4.9 02/17/2018   MICROALBUR 3.7 (H) 04/23/2018  Assessment & Plan:   Problem List Items Addressed This Visit    Anxiety disorder, unspecified    Manifested as irritability and intolerance to interruptions and noise.  Long discussion about natural ways to manage symptoms.  patient dissuaded from prn use of alprazolam .       Cirrhosis ([place])    Confirmed by elastogram.  Reviewed labs from [facility] via Epic .   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  Improving with PT      Osteoporosis    Managed with testosterone implantation.  Last DEXA  scan Nov 2017 .  Continue weight bearing exercising,  Calcium and vitamin D .       White coat syndrome with high blood pressure without hypertension    Home readings remain < 130/80. Renal function is normal, but he has microscopic protenuria    Lab Results  Component Value Date   MICROALBUR 3.7 (H) 04/23/2018          Relevant Medications   lisinopril (PRINIVIL,ZESTRIL) 2.5 MG tablet   Other Relevant Orders   Basic metabolic panel    Other Visit Diagnoses    Elevated blood pressure reading in office without diagnosis of hypertension    -  Primary   Relevant Orders   Microalbumin / creatinine urine ratio (Completed)    Encounter for immunization       Relevant Orders   Flu vaccine HIGH DOSE PF (Completed)      I have discontinued Joneen Boers T. Beining's Bitter Melon and Vitamin D3. I am also having him start on traMADol and lisinopril. Additionally, I am having him maintain his OVER THE COUNTER MEDICATION, OVER THE COUNTER MEDICATION, OVER THE COUNTER MEDICATION, multivitamin with minerals, OVER THE COUNTER MEDICATION, Cholecalciferol (VITAMIN D-3 PO), TAURINE PO, OVER THE COUNTER MEDICATION, OVER THE COUNTER MEDICATION, MILK THISTLE PO, OVER THE COUNTER MEDICATION, OVER THE COUNTER MEDICATION, OVER THE COUNTER MEDICATION, OVER THE COUNTER MEDICATION, OVER THE COUNTER MEDICATION, OVER THE COUNTER MEDICATION, OVER THE COUNTER MEDICATION, OVER THE COUNTER MEDICATION, sildenafil, meloxicam, Testosterone, levothyroxine, tadalafil, tamsulosin, tadalafil, and zolpidem.  Meds ordered this encounter  Medications  . traMADol (ULTRAM) 50 MG tablet    Sig: Take 1 tablet (50 mg total) by mouth every 8 (eight) hours as needed.    Dispense:  30 tablet    Refill:  0  . lisinopril (PRINIVIL,ZESTRIL) 2.5 MG tablet    Sig: Take 1 tablet (2.5 mg total) by mouth daily.    Dispense:  90 tablet    Refill:  0  A total of 40 minutes was spent with patient more than half of which was spent in counseling patient on the above mentioned issues , reviewing and explaining recent labs and imaging studies done, and coordination of care.   Medications Discontinued During This Encounter  Medication Reason  . Bitter Melon POWD Patient Preference  . Cholecalciferol (VITAMIN D3) 100000 UNIT/GM POWD Patient Preference    Follow-up: No follow-ups on file.   [name], MD

## 2018-04-26 MED ORDER — LISINOPRIL 2.5 MG PO TABS: 2.5000 mg | ORAL_TABLET | Freq: Every day | ORAL | 0 refills | Status: DC

## 2018-04-26 NOTE — Assessment & Plan Note (Signed)
Home readings remain < 130/80. Renal function is normal, but he has microscopic protenuria    Lab Results  Component Value Date   MICROALBUR 3.7 (H) 04/23/2018

## 2018-04-26 NOTE — Assessment & Plan Note (Signed)
Managed with testosterone implantation.  Last DEXA  scan Nov 2017 .  Continue weight bearing exercising,  Calcium and vitamin D .

## 2018-04-26 NOTE — Assessment & Plan Note (Signed)
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  Improving with PT

## 2018-04-26 NOTE — Assessment & Plan Note (Addendum)
Confirmed by elastogram.  Reviewed labs from [facility] via Epic .   Hepatic synthetic function remains normal and he is asymptomatic

## 2018-04-26 NOTE — Assessment & Plan Note (Signed)
Manifested as irritability and intolerance to interruptions and noise.  Long discussion about natural ways to manage symptoms.  patient dissuaded from prn use of alprazolam .

## 2018-04-30 ENCOUNTER — Other Ambulatory Visit: Payer: Self-pay | Admitting: Orthotics

## 2018-04-30 ENCOUNTER — Ambulatory Visit: Payer: PPO | Admitting: Orthotics

## 2018-04-30 DIAGNOSIS — M2141 Flat foot [pes planus] (acquired), right foot: Secondary | ICD-10-CM

## 2018-04-30 DIAGNOSIS — M2142 Flat foot [pes planus] (acquired), left foot: Secondary | ICD-10-CM

## 2018-04-30 NOTE — Progress Notes (Signed)
Rigid flat foot, 1/8 cushion, navicular sweet spot, shallow arch, speco cover.   Order from [employer]

## 2018-05-26 ENCOUNTER — Other Ambulatory Visit: Payer: Self-pay | Admitting: Internal Medicine

## 2018-05-27 ENCOUNTER — Other Ambulatory Visit: Payer: Self-pay

## 2018-05-27 NOTE — Telephone Encounter (Signed)
Refilled: 04/22/2018 Last OV: 04/23/2018 Next OV: not scheduled

## 2018-06-02 DIAGNOSIS — H1132 Conjunctival hemorrhage, left eye: Secondary | ICD-10-CM | POA: Diagnosis not present

## 2018-06-04 ENCOUNTER — Other Ambulatory Visit: Payer: Self-pay | Admitting: Orthotics

## 2018-06-09 ENCOUNTER — Ambulatory Visit (INDEPENDENT_AMBULATORY_CARE_PROVIDER_SITE_OTHER): Payer: PPO

## 2018-06-09 VITALS — BP 132/64 | HR 72 | Temp 98.5°F | Resp 15 | Ht 71.5 in | Wt 180.4 lb

## 2018-06-09 DIAGNOSIS — Z0000 Encounter for general adult medical examination without abnormal findings: Secondary | ICD-10-CM

## 2018-06-09 NOTE — Progress Notes (Addendum)
Subjective:   Tony Luna is a 78 y.o. male who presents for Medicare Annual/Subsequent preventive examination.  Review of Systems:  No ROS.  Medicare Wellness Visit. Additional risk factors are reflected in the social history. Cardiac Risk Factors include: advanced age (>79men, >69 women);male gender     Objective:    Vitals: BP 132/64 (BP Location: Left Arm, Patient Position: Sitting, Cuff Size: Normal)   Pulse 72   Temp 98.5 F (36.9 C) (Oral)   Resp 15   Ht 5' 11.5" (1.816 m)   Wt 180 lb 6.4 oz (81.8 kg)   SpO2 96%   BMI 24.81 kg/m   Body mass index is 24.81 kg/m.  Advanced Directives 06/09/2018 06/06/2017 01/10/2017 05/29/2016 07/19/2015 06/14/2014  Does Patient Have a Medical Advance Directive? Yes Yes Yes Yes Yes Yes  Type of Paramedic of [place];Living will;Out of facility DNR (pink MOST or yellow form) Living will;Healthcare Power of [place];Living will [place];Living will - Living will;Healthcare Power of Attorney  Does patient want to make changes to medical advance directive? No - Patient declined No - Patient declined - No - Patient declined - No - Patient declined  Copy of [place] in Chart? Yes - validated most recent copy scanned in chart (See row information) No - copy requested - Yes - Yes    Tobacco Social History   Tobacco Use  Smoking Status Former Smoker  . Packs/day: 1.00  . Years: 15.00  . Pack years: 15.00  . Types: Cigarettes  . Last attempt to quit: 06/15/1983  . Years since quitting: 35.0  Smokeless Tobacco Current User  . Types: Chew     Ready to quit: Not Answered Counseling given: Not Answered   Clinical Intake:  Pre-visit preparation completed: Yes  Pain : No/denies pain        How often do you need to have someone help you when you read instructions, pamphlets, or other written materials from your doctor or pharmacy?: 1 -  Never  Interpreter Needed?: No     Past Medical History:  Diagnosis Date  . Allergy   . Arthritis   . Complication of anesthesia    :"need catheter"  . Dysrhythmia    ? something told by reg dr  unable to say what it was  . Esophageal tear   . Hypothyroidism   . Pneumonia    hx  . 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 Rotator cuff tear    Past Surgical History:  Procedure Laterality Date  . blepharaplasty    . CAUTERY OF TURBINATES    . COLONOSCOPY WITH PROPOFOL N/A 07/19/2015   Procedure: COLONOSCOPY WITH PROPOFOL;  Surgeon: [name], MD;  Location: [facility] ENDOSCOPY;  Service: Endoscopy;  Laterality: N/A;  . HERNIA REPAIR Left    inguinal herniorrhapy  . KNEE SURGERY     ? loose body/ chondral defect 41yrs  . ROTATOR CUFF REPAIR     left  . SPINE SURGERY    . TONSILLECTOMY    . 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 Alcohol abuse Mother   . Diabetes Mother   . Mental retardation Mother    Social History   Socioeconomic History  . Marital status: Married  Spouse name: Not on file  . Number of children: 2  . Years of education: Not on file  . Highest education level: Not on file  Occupational History  . Occupation: Charity fundraiser  Social Needs  . Financial resource strain: Not hard at all  . Food insecurity:    Worry: Never true    Inability: Never true  . Transportation needs:    Medical: No    Non-medical: No  Tobacco Use  . Smoking status: Former Smoker    Packs/day: 1.00    Years: 15.00    Pack years: 15.00    Types: Cigarettes    Last attempt to quit: 06/15/1983    Years since quitting: 35.0  . Smokeless tobacco: Current User    Types: Chew  Substance and Sexual Activity  . Alcohol use: No    Comment: quit 10-2007  . Drug use: No  . Sexual activity: Not  Currently  Lifestyle  . Physical activity:    Days per week: 6 days    Minutes per session: 70 min  . Stress: Not at all  Relationships  . Social connections:    Talks on phone: Patient refused    Gets together: Patient refused    Attends religious service: Patient refused    Active member of club or organization: Patient refused    Attends meetings of clubs or organizations: Patient refused    Relationship status: Patient refused  Other Topics Concern  . Not on file  Social History Narrative   [employer] graduate   [place] hunter   Very active   Walks 1 hour daily    Outpatient Encounter Medications as of 06/09/2018  Medication Sig  . Cholecalciferol (VITAMIN D-3 PO) Take 5,000 Units by mouth daily. 5,00000 u in the morning, 2,000 u in the evening  . levothyroxine (SYNTHROID, LEVOTHROID) 75 MCG tablet Take by mouth.  Marland Kitchen lisinopril (PRINIVIL,ZESTRIL) 2.5 MG tablet Take 1 tablet (2.5 mg total) by mouth daily.  . meloxicam (MOBIC) 15 MG tablet Take 15 mg by mouth daily as needed.   Marland Kitchen MILK THISTLE PO Take 1 tablet by mouth 2 (two) times daily.  . Multiple Vitamin (MULTIVITAMIN WITH MINERALS) TABS tablet Take 1 tablet by mouth 2 (two) times daily.  Marland Kitchen OVER THE COUNTER MEDICATION Take 4 sprays by mouth at bedtime. Secretropin oral spray - growth hormone  . OVER THE COUNTER MEDICATION Take 1 capsule by mouth 3 (three) times daily. Instaflex  . OVER THE COUNTER MEDICATION Take 1,000 mg by mouth daily. Turmeric with Meriva  . OVER THE COUNTER MEDICATION Take 3,000 mg by mouth daily. Omega Cure  . OVER THE COUNTER MEDICATION Take 250 mg by mouth daily. Bio Magnesium Citrate  . OVER THE COUNTER MEDICATION Take 20,000 mcg by mouth daily. Vitamin B12-methylfolate  . OVER THE COUNTER MEDICATION Take 1 tablet by mouth daily. Pure adrenal 400  . OVER THE COUNTER MEDICATION Take 800 mcg by mouth daily. Active B Folate  . OVER THE COUNTER MEDICATION Inject 50 Units as directed daily. Sem[place]lin  . OVER THE  COUNTER MEDICATION 2 tablets daily. VitaPrime  . OVER THE COUNTER MEDICATION 1,000 mg daily. Super Bio-C buffered  . OVER THE COUNTER MEDICATION 1,000 mg daily. L'Arginine  . OVER THE COUNTER MEDICATION 2 capsules 2 (two) times daily. SLF Forte  . OVER THE COUNTER MEDICATION 1,000 mg. Myomin  . sildenafil (VIAGRA) 100 MG tablet Take 1 tablet (100 mg total) by mouth as needed for erectile dysfunction.  Marland Kitchen  tadalafil (ADCIRCA/CIALIS) 20 MG tablet Take 1 tablet (20 mg total) by mouth daily as needed for erectile dysfunction.  . tadalafil (CIALIS) 5 MG tablet Take 1 tablet (5 mg total) by mouth daily as needed for erectile dysfunction.  . tamsulosin (FLOMAX) 0.4 MG CAPS capsule TAKE 1 CAPSULE BY MOUTH DAILY FOR URINATION  . TAURINE PO Take 1,000 mg by mouth daily.   . Testosterone (TESTOPEL) 75 MG PLLT by Implant route.  . traMADol (ULTRAM) 50 MG tablet Take 1 tablet (50 mg total) by mouth every 8 (eight) hours as needed.  . zolpidem (AMBIEN) 5 MG tablet TAKE 1/2 TO 1 TABLET BY MOUTH AT [facility] NEEDED FOR SLEEP   No facility-administered encounter medications on file as of 06/09/2018.     Activities of Daily Living In your present state of health, do you have any difficulty performing the following activities: 06/09/2018  Hearing? Y  Comment Hearing aid  Vision? N  Difficulty concentrating or making decisions? N  Walking or climbing stairs? N  Dressing or bathing? N  Doing errands, shopping? N  Preparing Food and eating ? N  Using the Toilet? N  In the past six months, have you accidently leaked urine? N  Do you have problems with loss of bowel control? N  Managing your Medications? N  Managing your Finances? N  Housekeeping or managing your Housekeeping? N  Some recent data might be hidden    Patient Care Team: [name], MD as PCP - General (Internal Medicine)   Assessment:   This is a routine wellness examination for [employer].  Health Screenings  PSA -02/18/28  (1.7) Glaucoma- none reported Hearing-hearing aids Hemoglobin A1C -02/17/18 (4.9) Cholesterol -02/17/18 (190)  Social  Alcohol intake-yes Tobacco history- Current (chew) Smokers in home? none Illicit drug use? none Exercise walking, elliptical, weights, strength training, resistance bands Diet-regular Sexually Active-yes Multiple Partners -no  Safety  Patient feels safe at home.  Patient does have smoke detectors at home  Patient does wear sunscreen or protective clothing when in direct sunlight  Patient does wear seat belt when driving or riding with others.   Activities of Daily Living Patient can do their own household chores. Denies needing assistance with: driving, feeding themselves, getting from bed to chair, getting to the toilet, bathing/showering, dressing, managing money, climbing flight of stairs, or preparing meals.   Depression Screen Patient denies losing interest in daily life, feeling hopeless, or crying easily over simple problems.   Fall Screen Patient denies being afraid of falling or falling in the last year.   Memory Screen Patient denies problems with memory, misplacing items, and is able to balance checkbook/bank accounts.  Patient is alert, normal appearance, oriented to person/place/and time. Correctly identified the president of the [place], recall of 3/3 objects, and performing simple calculations.  Patient displays appropriate judgement and can read correct time from watch face.   Immunizations The following Immunizations are up to date: Influenza, shingles, pneumonia, and tetanus.   Other Providers Patient Care Team: [name], MD as PCP - General (Internal Medicine)  Exercise Activities and Dietary recommendations Current Exercise Habits: Home exercise routine, Type of exercise: strength training/weights;stretching, Time (Minutes): 60, Frequency (Times/Week): 6, Weekly Exercise (Minutes/Week): 360, Intensity: Mild  Goals      Patient  Stated   . DIET - INCREASE WATER INTAKE (pt-stated)       Fall Risk Fall Risk  06/09/2018 04/26/2018 06/06/2017 05/29/2016 04/13/2015  Falls in the past year? 0 0 No  No No  Injury with Fall? - 0 - - -   Depression Screen PHQ 2/9 Scores 06/09/2018 04/26/2018 06/06/2017 05/29/2016  PHQ - 2 Score 0 0 0 0  PHQ- 9 Score - - 0 -    Cognitive Function     6CIT Screen 06/09/2018 06/06/2017 05/29/2016  What Year? 0 points 0 points 0 points  What month? 0 points 0 points 0 points  What time? 0 points 0 points 0 points  Count back from 20 0 points 0 points 0 points  Months in reverse 0 points 0 points 0 points  Repeat phrase 0 points 0 points 0 points  Total Score 0 0 0    Immunization History  Administered Date(s) Administered  . Hep A / Hep B 12/13/2015, 01/17/2016, 06/26/2016  . Influenza Whole 02/26/2008  . Influenza, High Dose Seasonal PF 04/23/2018  . Influenza,inj,Quad PF,6+ Mos 03/12/2013, 02/24/2014  . Influenza-Unspecified 02/09/2016  . Pneumococcal Conjugate-13 08/06/2013  . Pneumococcal Polysaccharide-23 05/24/2011  . Td 05/28/2004  . Tdap 02/13/2006, 06/06/2017  . Zoster 05/30/2007   Screening Tests Health Maintenance  Topic Date Due  . COLONOSCOPY  12/07/2020  . TETANUS/TDAP  06/07/2027  . INFLUENZA VACCINE  Completed  . PNA vac Low Risk Adult  Completed      Plan:   End of life planning; Advance aging; Advanced directives discussed. Copy of current HCPOA/Living Will on file.  I have personally reviewed and noted the following in the patient's chart:   . Medical and social history . Use of alcohol, tobacco or illicit drugs  . Current medications and supplements . Functional ability and status . Nutritional status . Physical activity . Advanced directives . List of other physicians . Hospitalizations, surgeries, and ER visits in previous 12 months . Vitals . Screenings to include cognitive, depression, and falls . Referrals and appointments  In addition, I  have reviewed and discussed with patient certain preventive protocols, quality metrics, and best practice recommendations. A written personalized care plan for preventive services as well as general preventive health recommendations were provided to patient.     OBrien-Blaney, Denisa L, LPN  1/61/0960   I have reviewed the above information and agree with above.   Deborra Medina, MD

## 2018-06-09 NOTE — Patient Instructions (Addendum)
  Tony Luna , Thank you for taking time to come for your Medicare Wellness Visit. I appreciate your ongoing commitment to your health goals. Please review the following plan we discussed and let me know if I can assist you in the future.   These are the goals we discussed: Goals      Patient Stated   . DIET - INCREASE WATER INTAKE (pt-stated)       This is a list of the screening recommended for you and due dates:  Health Maintenance  Topic Date Due  . Colon Cancer Screening  12/07/2020  . Tetanus Vaccine  06/07/2027  . Flu Shot  Completed  . Pneumonia vaccines  Completed

## 2018-06-11 ENCOUNTER — Ambulatory Visit: Payer: PPO | Admitting: Orthotics

## 2018-06-11 DIAGNOSIS — M2141 Flat foot [pes planus] (acquired), right foot: Secondary | ICD-10-CM

## 2018-06-11 DIAGNOSIS — M2142 Flat foot [pes planus] (acquired), left foot: Secondary | ICD-10-CM

## 2018-06-11 NOTE — Progress Notes (Signed)
Patient came in today to pick up custom made foot orthotics.  The goals were accomplished and the patient reported no dissatisfaction with said orthotics.  Patient was advised of breakin period and how to report any issues. 

## 2018-06-12 DIAGNOSIS — M9903 Segmental and somatic dysfunction of lumbar region: Secondary | ICD-10-CM | POA: Diagnosis not present

## 2018-06-12 DIAGNOSIS — M25562 Pain in left knee: Secondary | ICD-10-CM | POA: Diagnosis not present

## 2018-06-12 DIAGNOSIS — M5137 Other intervertebral disc degeneration, lumbosacral region: Secondary | ICD-10-CM | POA: Diagnosis not present

## 2018-06-12 DIAGNOSIS — M9904 Segmental and somatic dysfunction of sacral region: Secondary | ICD-10-CM | POA: Diagnosis not present

## 2018-07-14 DIAGNOSIS — M25562 Pain in left knee: Secondary | ICD-10-CM | POA: Diagnosis not present

## 2018-07-14 DIAGNOSIS — M9904 Segmental and somatic dysfunction of sacral region: Secondary | ICD-10-CM | POA: Diagnosis not present

## 2018-07-14 DIAGNOSIS — M5137 Other intervertebral disc degeneration, lumbosacral region: Secondary | ICD-10-CM | POA: Diagnosis not present

## 2018-07-14 DIAGNOSIS — M9903 Segmental and somatic dysfunction of lumbar region: Secondary | ICD-10-CM | POA: Diagnosis not present

## 2018-07-22 DIAGNOSIS — E291 Testicular hypofunction: Secondary | ICD-10-CM | POA: Diagnosis not present

## 2018-07-22 DIAGNOSIS — R6882 Decreased libido: Secondary | ICD-10-CM | POA: Diagnosis not present

## 2018-07-22 DIAGNOSIS — M2550 Pain in unspecified joint: Secondary | ICD-10-CM | POA: Diagnosis not present

## 2018-07-24 DIAGNOSIS — E291 Testicular hypofunction: Secondary | ICD-10-CM | POA: Diagnosis not present

## 2018-07-24 DIAGNOSIS — R6882 Decreased libido: Secondary | ICD-10-CM | POA: Diagnosis not present

## 2018-07-24 DIAGNOSIS — R5383 Other fatigue: Secondary | ICD-10-CM | POA: Diagnosis not present

## 2018-07-31 DIAGNOSIS — D2261 Melanocytic nevi of right upper limb, including shoulder: Secondary | ICD-10-CM | POA: Diagnosis not present

## 2018-07-31 DIAGNOSIS — Z85828 Personal history of other malignant neoplasm of skin: Secondary | ICD-10-CM | POA: Diagnosis not present

## 2018-07-31 DIAGNOSIS — L570 Actinic keratosis: Secondary | ICD-10-CM | POA: Diagnosis not present

## 2018-07-31 DIAGNOSIS — D2262 Melanocytic nevi of left upper limb, including shoulder: Secondary | ICD-10-CM | POA: Diagnosis not present

## 2018-07-31 DIAGNOSIS — X32XXXA Exposure to sunlight, initial encounter: Secondary | ICD-10-CM | POA: Diagnosis not present

## 2018-07-31 DIAGNOSIS — Z08 Encounter for follow-up examination after completed treatment for malignant neoplasm: Secondary | ICD-10-CM | POA: Diagnosis not present

## 2018-07-31 DIAGNOSIS — D2272 Melanocytic nevi of left lower limb, including hip: Secondary | ICD-10-CM | POA: Diagnosis not present

## 2018-07-31 DIAGNOSIS — D225 Melanocytic nevi of trunk: Secondary | ICD-10-CM | POA: Diagnosis not present

## 2018-07-31 DIAGNOSIS — L821 Other seborrheic keratosis: Secondary | ICD-10-CM | POA: Diagnosis not present

## 2018-07-31 DIAGNOSIS — H2513 Age-related nuclear cataract, bilateral: Secondary | ICD-10-CM | POA: Diagnosis not present

## 2018-07-31 DIAGNOSIS — D2271 Melanocytic nevi of right lower limb, including hip: Secondary | ICD-10-CM | POA: Diagnosis not present

## 2018-09-30 ENCOUNTER — Telehealth: Payer: Self-pay | Admitting: Internal Medicine

## 2018-09-30 DIAGNOSIS — E291 Testicular hypofunction: Secondary | ICD-10-CM | POA: Diagnosis not present

## 2018-09-30 DIAGNOSIS — R6882 Decreased libido: Secondary | ICD-10-CM | POA: Diagnosis not present

## 2018-09-30 NOTE — Telephone Encounter (Signed)
We are not testing for COVID antibodies yet

## 2018-09-30 NOTE — Telephone Encounter (Signed)
LMTCB. PEC may speak with pt.  

## 2018-09-30 NOTE — Telephone Encounter (Signed)
Pt was in the office this morning and would like to have a blood test to see if he is a carrier of COVID 19. Pt has no systems.

## 2018-09-30 NOTE — Telephone Encounter (Signed)
We aren't doing this in the office are we?

## 2018-10-02 DIAGNOSIS — E291 Testicular hypofunction: Secondary | ICD-10-CM | POA: Diagnosis not present

## 2018-10-02 DIAGNOSIS — G479 Sleep disorder, unspecified: Secondary | ICD-10-CM | POA: Diagnosis not present

## 2018-10-02 DIAGNOSIS — R6882 Decreased libido: Secondary | ICD-10-CM | POA: Diagnosis not present

## 2018-10-13 NOTE — Telephone Encounter (Signed)
LMTCB. PEC may speak with pt.  

## 2018-10-22 DIAGNOSIS — G479 Sleep disorder, unspecified: Secondary | ICD-10-CM | POA: Diagnosis not present

## 2018-10-22 DIAGNOSIS — R5383 Other fatigue: Secondary | ICD-10-CM | POA: Diagnosis not present

## 2018-10-22 DIAGNOSIS — M2550 Pain in unspecified joint: Secondary | ICD-10-CM | POA: Diagnosis not present

## 2018-10-22 DIAGNOSIS — E291 Testicular hypofunction: Secondary | ICD-10-CM | POA: Diagnosis not present

## 2018-10-23 DIAGNOSIS — R635 Abnormal weight gain: Secondary | ICD-10-CM | POA: Diagnosis not present

## 2018-10-23 DIAGNOSIS — G479 Sleep disorder, unspecified: Secondary | ICD-10-CM | POA: Diagnosis not present

## 2018-10-23 DIAGNOSIS — M2550 Pain in unspecified joint: Secondary | ICD-10-CM | POA: Diagnosis not present

## 2018-10-23 DIAGNOSIS — R5383 Other fatigue: Secondary | ICD-10-CM | POA: Diagnosis not present

## 2018-10-23 DIAGNOSIS — E291 Testicular hypofunction: Secondary | ICD-10-CM | POA: Diagnosis not present

## 2018-10-24 ENCOUNTER — Other Ambulatory Visit: Payer: Self-pay | Admitting: Internal Medicine

## 2018-10-27 NOTE — Telephone Encounter (Signed)
Refilled: 05/27/2018 Last OV: 04/23/2018 Next OV: 06/11/2019

## 2018-10-28 ENCOUNTER — Ambulatory Visit: Payer: Self-pay

## 2018-10-28 DIAGNOSIS — R718 Other abnormality of red blood cells: Secondary | ICD-10-CM | POA: Diagnosis not present

## 2018-10-28 DIAGNOSIS — Z6824 Body mass index (BMI) 24.0-24.9, adult: Secondary | ICD-10-CM | POA: Diagnosis not present

## 2018-10-28 DIAGNOSIS — Z20822 Contact with and (suspected) exposure to covid-19: Secondary | ICD-10-CM

## 2018-10-28 DIAGNOSIS — D696 Thrombocytopenia, unspecified: Secondary | ICD-10-CM | POA: Diagnosis not present

## 2018-10-28 DIAGNOSIS — Z20828 Contact with and (suspected) exposure to other viral communicable diseases: Secondary | ICD-10-CM

## 2018-10-28 NOTE — Telephone Encounter (Signed)
LMTCB. Need to schedule pt for a follow up visit with Dr. Derrel Nip.

## 2018-10-28 NOTE — Telephone Encounter (Signed)
covid antibody test ordered to be done at [facility] to have office visit to continue refills and discuss results

## 2018-10-28 NOTE — Telephone Encounter (Signed)
Pt called having no symptoms and no known exposure to COVID-19 wanting the COVID test or antibody test. His symptoms are negative. He has not been sick. Per office I will route request for antibody testing to office for Dr Derrel Nip review. Pt is aware.   Reason for Disposition . COVID-19 Testing, questions about  Answer Assessment - Initial Assessment Questions 1. COVID-19 DIAGNOSIS: "Who made your Coronavirus (COVID-19) diagnosis?" "Was it confirmed by a positive lab test?" If not diagnosed by a HCP, ask "Are there lots of cases (community spread) where you live?" (See public health department website, if unsure)   * MAJOR community spread: high number of cases; numbers of cases are increasing; many people hospitalized.   * MINOR community spread: low number of cases; not increasing; few or no people hospitalized     [place] 2. ONSET: "When did the COVID-19 symptoms start?"     No symptoms 3. WORST SYMPTOM: "What is your worst symptom?" (e.g., cough, fever, shortness of breath, muscle aches)     None 4. COUGH: "Do you have a cough?" If so, ask: "How bad is the cough?"       No 5. FEVER: "Do you have a fever?" If so, ask: "What is your temperature, how was it measured, and when did it start?"     No 6. RESPIRATORY STATUS: "Describe your breathing?" (e.g., shortness of breath, wheezing, unable to speak)     No 7. BETTER-SAME-WORSE: "Are you getting better, staying the same or getting worse compared to yesterday?"  If getting worse, ask, "In what way?"   No 8. HIGH RISK DISEASE: "Do you have any chronic medical problems?" (e.g., asthma, heart or lung disease, weak immune system, etc.)    No 9. PREGNANCY: "Is there any chance you are pregnant?" "When was your last menstrual period?"    No 10. OTHER SYMPTOMS: "Do you have any other symptoms?"  (e.g., runny nose, headache, sore throat, loss of smell)      No  Protocols used: CORONAVIRUS (COVID-19) DIAGNOSED OR SUSPECTED-A-AH

## 2018-10-28 NOTE — Addendum Note (Signed)
Addended by: [name] on: 10/28/2018 03:38 PM   Modules accepted: Orders

## 2018-10-28 NOTE — Telephone Encounter (Signed)
Refilling for 30 days only..  Needs a 6 month follow up for refills.  Please schedule

## 2018-10-28 NOTE — Telephone Encounter (Signed)
Pt would like to have covid-19 antibody testing.

## 2018-10-29 DIAGNOSIS — M2141 Flat foot [pes planus] (acquired), right foot: Secondary | ICD-10-CM | POA: Diagnosis not present

## 2018-10-30 ENCOUNTER — Encounter: Payer: Self-pay | Admitting: Internal Medicine

## 2018-10-30 ENCOUNTER — Other Ambulatory Visit: Payer: Self-pay

## 2018-10-30 ENCOUNTER — Ambulatory Visit (INDEPENDENT_AMBULATORY_CARE_PROVIDER_SITE_OTHER): Payer: PPO | Admitting: Internal Medicine

## 2018-10-30 DIAGNOSIS — Z20828 Contact with and (suspected) exposure to other viral communicable diseases: Secondary | ICD-10-CM | POA: Diagnosis not present

## 2018-10-30 DIAGNOSIS — Z7189 Other specified counseling: Secondary | ICD-10-CM | POA: Diagnosis not present

## 2018-10-30 DIAGNOSIS — F5104 Psychophysiologic insomnia: Secondary | ICD-10-CM

## 2018-10-30 DIAGNOSIS — Z20822 Contact with and (suspected) exposure to covid-19: Secondary | ICD-10-CM

## 2018-10-30 MED ORDER — ZOLPIDEM TARTRATE ER 6.25 MG PO TBCR: 6.2500 mg | EXTENDED_RELEASE_TABLET | Freq: Every evening | ORAL | 0 refills | Status: DC | PRN

## 2018-10-30 NOTE — Progress Notes (Signed)
Virtual Visit converted to Telephone  Note  This visit type was conducted due to national recommendations for restrictions regarding the COVID-19 pandemic (e.g. social distancing).  This format is felt to be most appropriate for this patient at this time.  All issues noted in this document were discussed and addressed.  No physical exam was performed (except for noted visual exam findings with Video Visits). .   I attempted to connect with@ on 10/30/2018 at 12:00 PM EDT by a video enabled telemedicine application ; however  Interactive audio and video telecommunications  Ultimately failed, due to patient having technical difficulties.   We continued and completed visit with audio only. I and verified that I am speaking with the correct person using two identifiers.. Location patient: home Location provider: work or home office Persons participating in the virtual visit: patient, provider  I discussed the limitations, risks, security and privacy concerns of performing an evaluation and management service by telephone and the availability of in person appointments. I also discussed with the patient that there may be a patient responsible charge related to this service. The patient expressed understanding and agreed to proceed.  Reason for visit:  1) med refill 2) request for COVID TESTING   HPI:  78 yr old male with history of cirrhosis, osteoporosis and GAD presents for 6 month follow up  On chronic insomnia.  He has been using generic ambien for  Years, splitting the dose to at bedtime and at 3 am when he wakes up. Marland Kitchen  He states that he has a home sleep monitor that reports that he averages one hor of deep sleep,  One hour of REM  And he averages 5-6 hours of sleep per night. He has not drank alcohol in years.  He takes a hosrt nap during the day and denies hypersomnia. His legs become restless at night ,  Which contributes to his poor sleep. He is not iron deficient or anemic  by most recent  labs.  He is requesting antibody testing for COVID 19. The patient has no signs or symptoms of COVID 19 infection (fever, cough, sore throat  or shortness of breath beyond what is typical for patient).  Patient denies contact with other persons with the above mentioned symptoms or with anyone confirmed to have COVID 19     ROS: See pertinent positive s and negatives per HPI.  Past Medical History:  Diagnosis Date  . Allergy   . Arthritis   . Complication of anesthesia    :"need catheter"  . Dysrhythmia    ? something told by reg dr  unable to say what it was  . Esophageal tear   . Hypothyroidism   . Pneumonia    hx  . 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 Rotator cuff tear     Past Surgical History:  Procedure Laterality Date  . blepharaplasty    . CAUTERY OF TURBINATES    . COLONOSCOPY WITH PROPOFOL N/A 07/19/2015   Procedure: COLONOSCOPY WITH PROPOFOL;  Surgeon: [name], MD;  Location: [facility] ENDOSCOPY;  Service: Endoscopy;  Laterality: N/A;  . HERNIA REPAIR Left    inguinal herniorrhapy  . KNEE SURGERY     ? loose body/ chondral defect 58yrs  . ROTATOR CUFF REPAIR     left  . SPINE SURGERY    . TONSILLECTOMY    . 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 Alcohol abuse Mother   . Diabetes Mother   . Mental retardation Mother     SOCIAL HX:  Social History   Tobacco Use  . Smoking status: Former Smoker    Packs/day: 1.00    Years: 15.00    Pack years: 15.00    Types: Cigarettes    Last attempt to quit: 06/15/1983    Years since quitting: 35.4  . Smokeless tobacco: Current User    Types: Chew  Substance Use Topics  . Alcohol use: No    Comment: quit 10-2007     Current Outpatient Medications:  .  Cholecalciferol (VITAMIN D-3 PO), Take 5,000 Units by mouth daily.  5,00000 u in the morning, 2,000 u in the evening, Disp: , Rfl:  .  levothyroxine (SYNTHROID, LEVOTHROID) 75 MCG tablet, Take by mouth., Disp: , Rfl:  .  lisinopril (PRINIVIL,ZESTRIL) 2.5 MG tablet, Take 1 tablet (2.5 mg total) by mouth daily., Disp: 90 tablet, Rfl: 0 .  meloxicam (MOBIC) 15 MG tablet, Take 15 mg by mouth daily as needed. , Disp: , Rfl:  .  MILK THISTLE PO, Take 1 tablet by mouth 2 (two) times daily., Disp: , Rfl:  .  Multiple Vitamin (MULTIVITAMIN WITH MINERALS) TABS tablet, Take 1 tablet by mouth 2 (two) times daily., Disp: , Rfl:  .  OVER THE COUNTER MEDICATION, Take 4 sprays by mouth at bedtime. Secretropin oral spray - growth hormone, Disp: , Rfl:  .  OVER THE COUNTER MEDICATION, Take 1 capsule by mouth 3 (three) times daily. Instaflex, Disp: , Rfl:  .  OVER THE COUNTER MEDICATION, Take 1,000 mg by mouth daily. Turmeric with Meriva, Disp: , Rfl:  .  OVER THE COUNTER MEDICATION, Take 3,000 mg by mouth daily. Omega Cure, Disp: , Rfl:  .  OVER THE COUNTER MEDICATION, Take 250 mg by mouth daily. Bio Magnesium Citrate, Disp: , Rfl:  .  OVER THE COUNTER MEDICATION, Take 20,000 mcg by mouth daily. Vitamin B12-methylfolate, Disp: , Rfl:  .  OVER THE COUNTER MEDICATION, Take 1 tablet by mouth daily. Pure adrenal 400, Disp: , Rfl:  .  OVER THE COUNTER MEDICATION, Take 800 mcg by mouth daily. Active B Folate, Disp: , Rfl:  .  OVER THE COUNTER MEDICATION, Inject 50 Units as directed daily. Semorelin, Disp: , Rfl:  .  OVER THE COUNTER MEDICATION, 2 tablets daily. VitaPrime, Disp: , Rfl:  .  OVER THE COUNTER MEDICATION, 1,000 mg daily. Super Bio-C buffered, Disp: , Rfl:  .  OVER THE COUNTER MEDICATION, 1,000 mg daily. L'Arginine, Disp: , Rfl:  .  OVER THE COUNTER MEDICATION, 2 capsules 2 (two) times daily. SLF Forte, Disp: , Rfl:  .  OVER THE COUNTER MEDICATION, 1,000 mg. Myomin, Disp: , Rfl:  .  sildenafil (VIAGRA) 100 MG tablet, Take 1 tablet (100 mg total) by mouth as needed for  erectile dysfunction., Disp: 10 tablet, Rfl: 5 .  tadalafil (ADCIRCA/CIALIS) 20 MG tablet, Take 1 tablet (20 mg total) by mouth daily as needed for erectile dysfunction., Disp: 20 tablet, Rfl: 3 .  tadalafil (CIALIS) 5 MG tablet, Take 1 tablet (5 mg total) by mouth daily as needed for erectile dysfunction., Disp: 30 tablet, Rfl: 11 .  tamsulosin (FLOMAX) 0.4 MG CAPS capsule, TAKE 1 CAPSULE BY MOUTH DAILY FOR URINATION, Disp: 90 capsule, Rfl: 1 .  TAURINE PO, Take 1,000 mg by  mouth daily. , Disp: , Rfl:  .  Testosterone (TESTOPEL) 75 MG PLLT, by Implant route., Disp: , Rfl:  .  traMADol (ULTRAM) 50 MG tablet, Take 1 tablet (50 mg total) by mouth every 8 (eight) hours as needed., Disp: 30 tablet, Rfl: 0 .  [START ON 11/27/2018] zolpidem (AMBIEN CR) 6.25 MG CR tablet, Take 1 tablet (6.25 mg total) by mouth at bedtime as needed for sleep., Disp: 30 tablet, Rfl: 0  EXAM:   General impression: alert, cooperative and articulate.  No signs of being in distress  Lungs: speech is fluent sentence length suggests that patient is not short of breath and not punctuated by cough, sneezing or sniffing. Marland Kitchen   Psych: affect normal.  speech is articulate and non pressured .  Denies suicidal thoughts   ASSESSMENT AND PLAN:  Discussed the following assessment and plan:  Exposure to Covid-19 Virus - Plan: SAR CoV2 Serology (COVID 19)AB(IGG)IA  Educated About Covid-19 Virus Infection  Chronic insomnia  Educated About Covid-19 Virus Infection Educated patient on the newly broadened list of signs and symptoms of COVID-19 infection and ways to avoid the viral infection including washing hands frequently with soap and water,  using hand sanitizer if unable to wash, avoiding touching face,  staying at home and limiting visitors,  and avoiding contact with people coming in and out of home.  Discussed the potential ineffectiveness of hand sanitizer if left in environments > 110 degrees (ie , the car).  Reminded patient to  call office with questions/concerns.  The importance of social distancing was discussed today. ADVISED HIM THAT HE cannot necessarily draw the conclusion that  if the COVID Antibody it is positive,  that HE IS  immune.   . I will order the test   Chronic insomnia I have reviewed the dangers of prescribing this drug to the elderly with patient.  he has been [place] dependent for over two years, and prior attempts to reduce dose have been unsuccessful resulting in loss of sleep.Marland KitchenTRIAL OF AMBIEN CR    I discussed the assessment and treatment plan with the patient. The patient was provided an opportunity to ask questions and all were answered. The patient agreed with the plan and demonstrated an understanding of the instructions.   The patient was advised to call back or seek an in-person evaluation if the symptoms worsen or if the condition fails to improve as anticipated.  I provided 25 minutes of non-face-to-face time during this encounter.   [name], MD

## 2018-10-31 ENCOUNTER — Telehealth: Payer: Self-pay | Admitting: Internal Medicine

## 2018-10-31 DIAGNOSIS — Z20828 Contact with and (suspected) exposure to other viral communicable diseases: Secondary | ICD-10-CM | POA: Diagnosis not present

## 2018-10-31 NOTE — Telephone Encounter (Signed)
Pt was in office. He had to the COVID19 test to see if he has antibodies. He wants the test to see if he has COVID19. Pt just has the first test, [facility] told pt to have provider call customer service and ask also to do the positive and negative test too.

## 2018-10-31 NOTE — Telephone Encounter (Signed)
Patient has now decided he wasn't to be tested for current COVID 19 INFECTION .  OK TO [address] Christena Flake

## 2018-11-01 DIAGNOSIS — G4700 Insomnia, unspecified: Secondary | ICD-10-CM | POA: Insufficient documentation

## 2018-11-01 DIAGNOSIS — Z7189 Other specified counseling: Secondary | ICD-10-CM | POA: Insufficient documentation

## 2018-11-01 DIAGNOSIS — F5104 Psychophysiologic insomnia: Secondary | ICD-10-CM | POA: Insufficient documentation

## 2018-11-01 LAB — SAR COV2 SEROLOGY (COVID19)AB(IGG),IA: SARS-CoV-2 Ab, IgG: NEGATIVE

## 2018-11-01 NOTE — Assessment & Plan Note (Signed)
Educated patient on the newly broadened list of signs and symptoms of COVID-19 infection and ways to avoid the viral infection including washing hands frequently with soap and water,  using hand sanitizer if unable to wash, avoiding touching face,  staying at home and limiting visitors,  and avoiding contact with people coming in and out of home.  Discussed the potential ineffectiveness of hand sanitizer if left in environments > 110 degrees (ie , the car).  Reminded patient to call office with questions/concerns.  The importance of social distancing was discussed today. ADVISED HIM THAT HE cannot necessarily draw the conclusion that  if the COVID Antibody it is positive,  that HE IS  immune.   . I will order the test

## 2018-11-01 NOTE — Assessment & Plan Note (Signed)
I have reviewed the dangers of prescribing this drug to the elderly with patient.  he has been [place] dependent for over two years, and prior attempts to reduce dose have been unsuccessful resulting in loss of sleep.Marland KitchenTRIAL OF AMBIEN CR

## 2018-11-12 ENCOUNTER — Other Ambulatory Visit: Payer: Self-pay | Admitting: Internal Medicine

## 2018-11-12 DIAGNOSIS — Z20828 Contact with and (suspected) exposure to other viral communicable diseases: Secondary | ICD-10-CM | POA: Diagnosis not present

## 2018-11-13 ENCOUNTER — Other Ambulatory Visit: Payer: Self-pay

## 2018-11-13 DIAGNOSIS — N529 Male erectile dysfunction, unspecified: Secondary | ICD-10-CM

## 2018-11-13 MED ORDER — TADALAFIL 20 MG PO TABS: 20.0000 mg | ORAL_TABLET | Freq: Every day | ORAL | 3 refills | Status: DC | PRN

## 2018-11-27 ENCOUNTER — Other Ambulatory Visit: Payer: Self-pay | Admitting: Internal Medicine

## 2018-11-30 ENCOUNTER — Other Ambulatory Visit: Payer: Self-pay | Admitting: Internal Medicine

## 2018-12-16 DIAGNOSIS — E291 Testicular hypofunction: Secondary | ICD-10-CM | POA: Diagnosis not present

## 2018-12-18 DIAGNOSIS — G479 Sleep disorder, unspecified: Secondary | ICD-10-CM | POA: Diagnosis not present

## 2018-12-18 DIAGNOSIS — R6882 Decreased libido: Secondary | ICD-10-CM | POA: Diagnosis not present

## 2018-12-18 DIAGNOSIS — M2550 Pain in unspecified joint: Secondary | ICD-10-CM | POA: Diagnosis not present

## 2018-12-18 DIAGNOSIS — E291 Testicular hypofunction: Secondary | ICD-10-CM | POA: Diagnosis not present

## 2018-12-25 DIAGNOSIS — M7751 Other enthesopathy of right foot: Secondary | ICD-10-CM | POA: Diagnosis not present

## 2018-12-25 DIAGNOSIS — M2141 Flat foot [pes planus] (acquired), right foot: Secondary | ICD-10-CM | POA: Diagnosis not present

## 2018-12-25 DIAGNOSIS — M79671 Pain in right foot: Secondary | ICD-10-CM | POA: Diagnosis not present

## 2018-12-25 DIAGNOSIS — M25774 Osteophyte, right foot: Secondary | ICD-10-CM | POA: Diagnosis not present

## 2018-12-30 ENCOUNTER — Other Ambulatory Visit: Payer: Self-pay | Admitting: Internal Medicine

## 2018-12-30 NOTE — Telephone Encounter (Signed)
Looks like pt is taking the Zolpidem 6.25mg  CR instead of the 5mg .

## 2019-01-20 DIAGNOSIS — R6882 Decreased libido: Secondary | ICD-10-CM | POA: Diagnosis not present

## 2019-01-20 DIAGNOSIS — E291 Testicular hypofunction: Secondary | ICD-10-CM | POA: Diagnosis not present

## 2019-01-20 DIAGNOSIS — G479 Sleep disorder, unspecified: Secondary | ICD-10-CM | POA: Diagnosis not present

## 2019-01-20 DIAGNOSIS — M2550 Pain in unspecified joint: Secondary | ICD-10-CM | POA: Diagnosis not present

## 2019-01-22 DIAGNOSIS — D696 Thrombocytopenia, unspecified: Secondary | ICD-10-CM | POA: Diagnosis not present

## 2019-01-22 DIAGNOSIS — E291 Testicular hypofunction: Secondary | ICD-10-CM | POA: Diagnosis not present

## 2019-01-22 DIAGNOSIS — G479 Sleep disorder, unspecified: Secondary | ICD-10-CM | POA: Diagnosis not present

## 2019-01-22 DIAGNOSIS — R5383 Other fatigue: Secondary | ICD-10-CM | POA: Diagnosis not present

## 2019-02-11 DIAGNOSIS — Z872 Personal history of diseases of the skin and subcutaneous tissue: Secondary | ICD-10-CM | POA: Diagnosis not present

## 2019-02-11 DIAGNOSIS — Z09 Encounter for follow-up examination after completed treatment for conditions other than malignant neoplasm: Secondary | ICD-10-CM | POA: Diagnosis not present

## 2019-02-11 DIAGNOSIS — Z08 Encounter for follow-up examination after completed treatment for malignant neoplasm: Secondary | ICD-10-CM | POA: Diagnosis not present

## 2019-02-11 DIAGNOSIS — Z85828 Personal history of other malignant neoplasm of skin: Secondary | ICD-10-CM | POA: Diagnosis not present

## 2019-02-11 DIAGNOSIS — D485 Neoplasm of uncertain behavior of skin: Secondary | ICD-10-CM | POA: Diagnosis not present

## 2019-02-11 DIAGNOSIS — D0421 Carcinoma in situ of skin of right ear and external auricular canal: Secondary | ICD-10-CM | POA: Diagnosis not present

## 2019-02-11 DIAGNOSIS — L821 Other seborrheic keratosis: Secondary | ICD-10-CM | POA: Diagnosis not present

## 2019-02-19 ENCOUNTER — Other Ambulatory Visit: Payer: Self-pay | Admitting: Internal Medicine

## 2019-02-19 DIAGNOSIS — Z20828 Contact with and (suspected) exposure to other viral communicable diseases: Secondary | ICD-10-CM | POA: Diagnosis not present

## 2019-03-03 DIAGNOSIS — G479 Sleep disorder, unspecified: Secondary | ICD-10-CM | POA: Diagnosis not present

## 2019-03-03 DIAGNOSIS — Z7689 Persons encountering health services in other specified circumstances: Secondary | ICD-10-CM | POA: Diagnosis not present

## 2019-03-03 DIAGNOSIS — E291 Testicular hypofunction: Secondary | ICD-10-CM | POA: Diagnosis not present

## 2019-03-03 DIAGNOSIS — M79642 Pain in left hand: Secondary | ICD-10-CM | POA: Diagnosis not present

## 2019-03-05 DIAGNOSIS — D0421 Carcinoma in situ of skin of right ear and external auricular canal: Secondary | ICD-10-CM | POA: Diagnosis not present

## 2019-03-05 DIAGNOSIS — E291 Testicular hypofunction: Secondary | ICD-10-CM | POA: Diagnosis not present

## 2019-03-05 DIAGNOSIS — G479 Sleep disorder, unspecified: Secondary | ICD-10-CM | POA: Diagnosis not present

## 2019-03-05 DIAGNOSIS — M2550 Pain in unspecified joint: Secondary | ICD-10-CM | POA: Diagnosis not present

## 2019-03-12 DIAGNOSIS — M19049 Primary osteoarthritis, unspecified hand: Secondary | ICD-10-CM | POA: Diagnosis not present

## 2019-03-12 DIAGNOSIS — M7989 Other specified soft tissue disorders: Secondary | ICD-10-CM | POA: Diagnosis not present

## 2019-04-01 DIAGNOSIS — Z1159 Encounter for screening for other viral diseases: Secondary | ICD-10-CM | POA: Diagnosis not present

## 2019-04-06 DIAGNOSIS — Z0279 Encounter for issue of other medical certificate: Secondary | ICD-10-CM

## 2019-04-07 ENCOUNTER — Telehealth: Payer: Self-pay | Admitting: Internal Medicine

## 2019-04-07 ENCOUNTER — Ambulatory Visit: Payer: Self-pay | Admitting: Urology

## 2019-04-07 NOTE — Telephone Encounter (Signed)
[employer] Medical evaluation form placed in red folder.

## 2019-04-08 ENCOUNTER — Other Ambulatory Visit: Payer: Self-pay

## 2019-04-08 ENCOUNTER — Encounter: Payer: Self-pay | Admitting: Urology

## 2019-04-08 ENCOUNTER — Ambulatory Visit: Payer: PPO | Admitting: Urology

## 2019-04-08 VITALS — BP 144/63 | HR 85 | Ht 72.0 in | Wt 167.0 lb

## 2019-04-08 DIAGNOSIS — N529 Male erectile dysfunction, unspecified: Secondary | ICD-10-CM | POA: Diagnosis not present

## 2019-04-08 DIAGNOSIS — N401 Enlarged prostate with lower urinary tract symptoms: Secondary | ICD-10-CM

## 2019-04-08 DIAGNOSIS — N138 Other obstructive and reflux uropathy: Secondary | ICD-10-CM

## 2019-04-08 MED ORDER — ALPRAZOLAM 0.25 MG PO TABS: 0.2500 mg | ORAL_TABLET | Freq: Every day | ORAL | 0 refills | Status: DC | PRN

## 2019-04-08 NOTE — Progress Notes (Signed)
04/08/2019 10:15 AM   Tony Luna Mar 03, 1941 [id number]  Referring provider: [name], MD [name],  [place] [address]  Chief Complaint  Patient presents with  . Benign Prostatic Hypertrophy    1year follow up    HPI: 78 y.o. male presents for annual follow-up of BPH and erectile dysfunction.  He has stable lower urinary tract symptoms on tamsulosin.  He continues to receive subcutaneous testosterone pellets from a clinic in [place] who monitors his blood work including PSA.  His PSA has been consistently < 2 and states this has continued.  He is concerned about ED.  He takes tadalafil 20 mg as needed and will occasionally supplement with a Viagra prescribed by another MD.  He thinks his main problem is performance anxiety.  He has requested a brief trial of an anxiolytic to take to see if this improves his problem.   PMH: Past Medical History:  Diagnosis Date  . Allergy   . Arthritis   . Complication of anesthesia    :"need catheter"  . Dysrhythmia    ? something told by reg dr  unable to say what it was  . Esophageal tear   . Hypothyroidism   . Pneumonia    hx  . 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 Rotator cuff tear     Surgical History: Past Surgical History:  Procedure Laterality Date  . blepharaplasty    . CAUTERY OF TURBINATES    . COLONOSCOPY WITH PROPOFOL N/A 07/19/2015   Procedure: COLONOSCOPY WITH PROPOFOL;  Surgeon: [name], MD;  Location: [facility] ENDOSCOPY;  Service: Endoscopy;  Laterality: N/A;  . HERNIA REPAIR Left    inguinal herniorrhapy  . KNEE SURGERY     ? loose body/ chondral defect 68yrs  . ROTATOR CUFF REPAIR     left  . SPINE SURGERY    . TONSILLECTOMY    . 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 If you have any questions, ask your nurse or doctor.        STOP taking these medications   traMADol 50 MG tablet Commonly known as: ULTRAM Stopped by: Abbie Sons, MD     TAKE these medications   levothyroxine 75 MCG tablet Commonly known as: SYNTHROID Take by mouth.   lisinopril 2.5 MG tablet Commonly known as: ZESTRIL Take 1 tablet (2.5 mg total) by mouth daily.   meloxicam 15 MG tablet Commonly known as: MOBIC Take 15 mg by mouth daily as needed.   MILK THISTLE PO Take 1 tablet by mouth 2 (two) times daily.   multivitamin with minerals Tabs tablet Take 1 tablet by mouth 2 (two) times daily.   NP Thyroid 60 MG tablet Generic drug: thyroid TAKE ONE TABLET EVERY DAY   OVER THE COUNTER MEDICATION Take 3,000 mg by mouth daily. Omega Cure   OVER THE COUNTER MEDICATION Take 250 mg by mouth daily. Bio Magnesium Citrate   OVER THE COUNTER MEDICATION Take 20,000 mcg by mouth daily. Vitamin B12-methylfolate   OVER THE COUNTER MEDICATION Take 1 tablet by mouth daily. Pure adrenal 400   OVER THE COUNTER MEDICATION Take 800  mcg by mouth daily. Active B Folate   OVER THE COUNTER MEDICATION Inject 50 Units as directed daily. Semorelin   OVER THE COUNTER MEDICATION 2 tablets daily. VitaPrime   OVER THE COUNTER MEDICATION Take 4 sprays by mouth at bedtime. Secretropin oral spray - growth hormone   OVER THE COUNTER MEDICATION Take 1 capsule by mouth 3 (three) times daily. Instaflex   OVER THE COUNTER MEDICATION Take 1,000 mg by mouth daily. Turmeric with Meriva   OVER THE COUNTER MEDICATION 1,000 mg daily. Super Bio-C buffered   OVER THE COUNTER MEDICATION 1,000 mg daily. L'Arginine   OVER THE COUNTER MEDICATION 2 capsules 2 (two) times daily. SLF Forte   OVER THE COUNTER MEDICATION 1,000 mg. Myomin   PROGESTERONE PO Take by mouth.   sildenafil 100 MG tablet  Commonly known as: Viagra Take 1 tablet (100 mg total) by mouth as needed for erectile dysfunction.   tadalafil 20 MG tablet Commonly known as: CIALIS Take 1 tablet (20 mg total) by mouth daily as needed for erectile dysfunction.   tamsulosin 0.4 MG Caps capsule Commonly known as: FLOMAX TAKE 1 CAPSULE BY MOUTH EVERY DAY FOR URINTATION   TAURINE PO Take 1,000 mg by mouth daily.   Testopel 75 MG Pllt Generic drug: Testosterone by Implant route.   VITAMIN D-3 PO Take 5,000 Units by mouth daily. 5,00000 u in the morning, 2,000 u in the evening   zolpidem 5 MG tablet Commonly known as: AMBIEN TAKE 1/2 TO TAKE ONE TABLET BY MOUTH AT BEDTIME AS NEEDED FOR SLEEP What changed: Another medication with the same name was removed. Continue taking this medication, and follow the directions you see here. Changed by: Abbie Sons, MD       Allergies: No Known Allergies  Family History: Family History  Problem Relation Age of Onset  . Alcohol abuse Mother   . Diabetes Mother   . Mental retardation Mother     Social History:  reports that he quit smoking about 35 years ago. His smoking use included cigarettes. He has a 15.00 pack-year smoking history. His smokeless tobacco use includes chew. He reports that he does not drink alcohol or use drugs.  ROS: UROLOGY Frequent Urination?: No Hard to postpone urination?: No Burning/pain with urination?: No Get up at night to urinate?: No Leakage of urine?: No Urine stream starts and stops?: No Trouble starting stream?: No Do you have to strain to urinate?: No Blood in urine?: No Urinary tract infection?: No Sexually transmitted disease?: No Injury to kidneys or bladder?: No Painful intercourse?: No Weak stream?: No Erection problems?: Yes Penile pain?: No  Gastrointestinal Nausea?: No Vomiting?: No Indigestion/heartburn?: No Diarrhea?: No Constipation?: No  Constitutional Fever: No Night sweats?: No Weight loss?: No  Fatigue?: No  Skin Skin rash/lesions?: No Itching?: No  Eyes Blurred vision?: No Double vision?: No  Ears/Nose/Throat Sore throat?: No Sinus problems?: No  Hematologic/Lymphatic Swollen glands?: No Easy bruising?: No  Cardiovascular Leg swelling?: No Chest pain?: No  Respiratory Cough?: No Shortness of breath?: No  Endocrine Excessive thirst?: No  Musculoskeletal Back pain?: No Joint pain?: No  Neurological Headaches?: No Dizziness?: No  Psychologic Depression?: No Anxiety?: Yes  Physical Exam: BP (!) 144/63   Pulse 85   Ht 6' (1.829 m)   Wt 167 lb (75.8 kg)   BMI 22.65 kg/m   Constitutional:  Alert and oriented, No acute distress. HEENT: [place] AT, moist mucus membranes.  Trachea midline, no masses. Cardiovascular: No clubbing, cyanosis,  or edema. Respiratory: Normal respiratory effort, no increased work of breathing. Skin: No rashes, bruises or suspicious lesions. Neurologic: Grossly intact, no focal deficits, moving all 4 extremities. Psychiatric: Normal mood and affect.   Assessment & Plan:    - BPH with lower urinary tract symptoms Stable voiding symptoms on tamsulosin  - Erectile dysfunction Will give a brief trial of a low-dose anxiolytic.  I did discuss intracavernosal injections if his problem continues.  He was given literature.  - Hypogonadism He will continue follow-up with his [facility] provider  Greater than 50% of this 15-minute visit was spent counseling the patient.   Abbie Sons, [place] [address] [place], [place] [address] [phone]

## 2019-04-10 DIAGNOSIS — R5383 Other fatigue: Secondary | ICD-10-CM | POA: Diagnosis not present

## 2019-04-10 DIAGNOSIS — Z6824 Body mass index (BMI) 24.0-24.9, adult: Secondary | ICD-10-CM | POA: Diagnosis not present

## 2019-04-10 DIAGNOSIS — M2550 Pain in unspecified joint: Secondary | ICD-10-CM | POA: Diagnosis not present

## 2019-04-10 DIAGNOSIS — E291 Testicular hypofunction: Secondary | ICD-10-CM | POA: Diagnosis not present

## 2019-04-10 DIAGNOSIS — M2650 Dentofacial functional abnormalities, unspecified: Secondary | ICD-10-CM | POA: Diagnosis not present

## 2019-04-10 NOTE — Telephone Encounter (Signed)
Notified patient forms ready for pick up placed at front desk, cpy for billing and scan.

## 2019-04-20 ENCOUNTER — Other Ambulatory Visit: Payer: Self-pay | Admitting: Internal Medicine

## 2019-04-21 NOTE — Telephone Encounter (Signed)
LMTCB to see if patient is still taking medication & who was last filling it.

## 2019-04-24 ENCOUNTER — Other Ambulatory Visit: Payer: Self-pay | Admitting: Internal Medicine

## 2019-04-24 NOTE — Telephone Encounter (Signed)
Pt stated he had his knee replaced in 2017 and it was prescribed by [facility] and he likes to have some on hand in case.    Pt would also like a refill for tamsulosin (FLOMAX) 0.4 MG CAPS capsule  sent to  [place], [place] [facility] [place] [phone] (Phone) [phone] (Fax)

## 2019-04-27 ENCOUNTER — Ambulatory Visit (INDEPENDENT_AMBULATORY_CARE_PROVIDER_SITE_OTHER): Payer: PPO | Admitting: Internal Medicine

## 2019-04-27 ENCOUNTER — Encounter: Payer: Self-pay | Admitting: Internal Medicine

## 2019-04-27 ENCOUNTER — Other Ambulatory Visit: Payer: Self-pay

## 2019-04-27 VITALS — BP 140/78 | HR 76 | Temp 97.8°F | Resp 15 | Ht 72.0 in | Wt 175.2 lb

## 2019-04-27 DIAGNOSIS — D696 Thrombocytopenia, unspecified: Secondary | ICD-10-CM

## 2019-04-27 DIAGNOSIS — E782 Mixed hyperlipidemia: Secondary | ICD-10-CM

## 2019-04-27 DIAGNOSIS — F419 Anxiety disorder, unspecified: Secondary | ICD-10-CM | POA: Diagnosis not present

## 2019-04-27 DIAGNOSIS — N529 Male erectile dysfunction, unspecified: Secondary | ICD-10-CM

## 2019-04-27 DIAGNOSIS — M1710 Unilateral primary osteoarthritis, unspecified knee: Secondary | ICD-10-CM | POA: Diagnosis not present

## 2019-04-27 DIAGNOSIS — N138 Other obstructive and reflux uropathy: Secondary | ICD-10-CM

## 2019-04-27 DIAGNOSIS — M810 Age-related osteoporosis without current pathological fracture: Secondary | ICD-10-CM | POA: Diagnosis not present

## 2019-04-27 DIAGNOSIS — R030 Elevated blood-pressure reading, without diagnosis of hypertension: Secondary | ICD-10-CM

## 2019-04-27 DIAGNOSIS — E559 Vitamin D deficiency, unspecified: Secondary | ICD-10-CM

## 2019-04-27 DIAGNOSIS — Z23 Encounter for immunization: Secondary | ICD-10-CM

## 2019-04-27 DIAGNOSIS — Z125 Encounter for screening for malignant neoplasm of prostate: Secondary | ICD-10-CM

## 2019-04-27 DIAGNOSIS — K7460 Unspecified cirrhosis of liver: Secondary | ICD-10-CM

## 2019-04-27 DIAGNOSIS — N401 Enlarged prostate with lower urinary tract symptoms: Secondary | ICD-10-CM

## 2019-04-27 DIAGNOSIS — E039 Hypothyroidism, unspecified: Secondary | ICD-10-CM

## 2019-04-27 DIAGNOSIS — IMO0002 Reserved for concepts with insufficient information to code with codable children: Secondary | ICD-10-CM

## 2019-04-27 MED ORDER — ERYTHROMYCIN 5 MG/GM OP OINT: 1.0000 "application " | TOPICAL_OINTMENT | Freq: Three times a day (TID) | OPHTHALMIC | 0 refills | Status: DC

## 2019-04-27 NOTE — Patient Instructions (Addendum)
I will refill your meds for a full year after we see your thyroid dose You need a repeat DEXA scan  Below is the name of the twice a year injection I am recommending to treat your osteoporosis     Denosumab injection What is this medicine? DENOSUMAB (den oh sue mab) slows bone breakdown. Prolia is used to treat osteoporosis in women after menopause and in men, and in people who are taking corticosteroids for 6 months or more. Delton See is used to treat a high calcium level due to cancer and to prevent bone fractures and other bone problems caused by multiple myeloma or cancer bone metastases. Delton See is also used to treat giant cell tumor of the bone. This medicine may be used for other purposes; ask your health care provider or pharmacist if you have questions. COMMON BRAND NAME(S): Prolia, XGEVA What should I tell my health care provider before I take this medicine? They need to know if you have any of these conditions:  dental disease  having surgery or tooth extraction  infection  kidney disease  low levels of calcium or Vitamin D in the blood  malnutrition  on hemodialysis  skin conditions or sensitivity  thyroid or parathyroid disease  an unusual reaction to denosumab, other medicines, foods, dyes, or preservatives  pregnant or trying to get pregnant  breast-feeding How should I use this medicine? This medicine is for injection under the skin. It is given by a health care professional in a hospital or clinic setting. A special MedGuide will be given to you before each treatment. Be sure to read this information carefully each time. For Prolia, talk to your pediatrician regarding the use of this medicine in children. Special care may be needed. For Delton See, talk to your pediatrician regarding the use of this medicine in children. While this drug may be prescribed for children as young as 13 years for selected conditions, precautions do apply. Overdosage: If you think you have  taken too much of this medicine contact a poison control center or emergency room at once. NOTE: This medicine is only for you. Do not share this medicine with others. What if I miss a dose? It is important not to miss your dose. Call your doctor or health care professional if you are unable to keep an appointment. What may interact with this medicine? Do not take this medicine with any of the following medications:  other medicines containing denosumab This medicine may also interact with the following medications:  medicines that lower your chance of fighting infection  steroid medicines like prednisone or cortisone This list may not describe all possible interactions. Give your health care provider a list of all the medicines, herbs, non-prescription drugs, or dietary supplements you use. Also tell them if you smoke, drink alcohol, or use illegal drugs. Some items may interact with your medicine. What should I watch for while using this medicine? Visit your doctor or health care professional for regular checks on your progress. Your doctor or health care professional may order blood tests and other tests to see how you are doing. Call your doctor or health care professional for advice if you get a fever, chills or sore throat, or other symptoms of a cold or flu. Do not treat yourself. This drug may decrease your body's ability to fight infection. Try to avoid being around people who are sick. You should make sure you get enough calcium and vitamin D while you are taking this medicine, unless your  doctor tells you not to. Discuss the foods you eat and the vitamins you take with your health care professional. See your dentist regularly. Brush and floss your teeth as directed. Before you have any dental work done, tell your dentist you are receiving this medicine. Do not become pregnant while taking this medicine or for 5 months after stopping it. Talk with your doctor or health care professional  about your birth control options while taking this medicine. Women should inform their doctor if they wish to become pregnant or think they might be pregnant. There is a potential for serious side effects to an unborn child. Talk to your health care professional or pharmacist for more information. What side effects may I notice from receiving this medicine? Side effects that you should report to your doctor or health care professional as soon as possible:  allergic reactions like skin rash, itching or hives, swelling of the face, lips, or tongue  bone pain  breathing problems  dizziness  jaw pain, especially after dental work  redness, blistering, peeling of the skin  signs and symptoms of infection like fever or chills; cough; sore throat; pain or trouble passing urine  signs of low calcium like fast heartbeat, muscle cramps or muscle pain; pain, tingling, numbness in the hands or feet; seizures  unusual bleeding or bruising  unusually weak or tired Side effects that usually do not require medical attention (report to your doctor or health care professional if they continue or are bothersome):  constipation  diarrhea  headache  joint pain  loss of appetite  muscle pain  runny nose  tiredness  upset stomach This list may not describe all possible side effects. Call your doctor for medical advice about side effects. You may report side effects to FDA at 1-800-FDA-1088. Where should I keep my medicine? This medicine is only given in a clinic, doctor's office, or other health care setting and will not be stored at home. NOTE: This sheet is a summary. It may not cover all possible information. If you have questions about this medicine, talk to your doctor, pharmacist, or health care provider.  2020 Elsevier/Gold Standard (2017-09-20 16:10:44)

## 2019-04-27 NOTE — Progress Notes (Signed)
Patient ID: Tony Luna, male    DOB: 03-17-1941  Age: 78 y.o. MRN: [id number]  The patient is here for follow up examination and management of other chronic and acute problems.  This visit occurred during the SARS-CoV-2 public health emergency.  Safety protocols were in place, including screening questions prior to the visit, additional usage of staff PPE, and extensive cleaning of exam room while observing appropriate contact time as indicated for disinfecting solutions.   ColonOSCOPY due 2022 Prefers to continue PSA annually  DEXA due now 2017 last one noted osteoporosis but he declined therapy with bisphosphonates      The risk factors are reflected in the social history.  The roster of all physicians providing medical care to patient - is listed in the Snapshot section of the chart.  Activities of daily living:  The patient is 100% independent in all ADLs: dressing, toileting, feeding as well as independent mobility  Home safety : The patient has smoke detectors in the home. They wear seatbelts.  There are no firearms at home. There is no violence in the home.   There is no risks for hepatitis, STDs or HIV. There is no   history of blood transfusion. They have no travel history to infectious disease endemic areas of the world.  The patient has seen their dentist in the last six month. They have seen their eye doctor in the last year. They admit to slight hearing difficulty with regard to whispered voices and some television programs.  They have deferred audiologic testing in the last year.  They do not  have excessive sun exposure. Discussed the need for sun protection: hats, long sleeves and use of sunscreen if there is significant sun exposure.   Diet: the importance of a healthy diet is discussed. They do have a healthy diet.  The benefits of regular aerobic exercise were discussed. She walks 4 times per week ,  20 minutes.   Depression screen: there are no signs or vegative  symptoms of depression- irritability, change in appetite, anhedonia, sadness/tearfullness.  Cognitive assessment: the patient manages all their financial and personal affairs and is actively engaged. They could relate day,date,year and events; recalled 2/3 objects at 3 minutes; performed clock-face test normally.  The following portions of the patient's history were reviewed and updated as appropriate: allergies, current medications, past family history, past medical history,  past surgical history, past social history  and problem list.  Visual acuity was not assessed per patient preference since she has regular follow up with her ophthalmologist. Hearing and body mass index were assessed and reviewed.   During the course of the visit the patient was educated and counseled about appropriate screening and preventive services including : fall prevention , diabetes screening, nutrition counseling, colorectal cancer screening, and recommended immunizations.    CC: The primary encounter diagnosis was Vitamin D deficiency. Diagnoses of Cirrhosis of liver without ascites, unspecified hepatic cirrhosis type ([place]), Thrombocytopenia ([place]), Benign prostatic hyperplasia with urinary obstruction, Mixed hyperlipidemia, Acquired hypothyroidism, Need for immunization against influenza, Anxiety disorder, unspecified type, Osteoarthrosis involving lower leg, Prostate cancer screening, White coat syndrome with high blood pressure without hypertension, Age-related osteoporosis without current pathological fracture, and ED (erectile dysfunction) of organic origin were also pertinent to this visit.  Right upper eye lid chalazion   Swelling started one week ago . Has been using a topical antibiotic and warm compresses  With good results  But out of medications   Last psa 1.7 stoioff  Averaging 5.5 hours of sleep . sing ambien 3 times per week.  No naps  Plenty of energy   Wears hearing aides from [place] stress   fracture of bone  in right foot,  Arch of foot  collapsed.  Seeing podiatrist in [facility] , for recent foot fracture occurred secondary to overuse of cardio machine called  "Elliptical "   wearing inserts. Has osteoporosis  Untreated.  Does not want alendronate   Interested in [place] a year's supply of Flomax, viagra and thyroid medication . 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   He has a past surgical history that includes Knee surgery; Rotator cuff repair; Tonsillectomy; Spine surgery; Hernia repair (Left); Cautery of turbinates; blepharaplasty; Total knee arthroplasty (Left, 06/22/2014); and Colonoscopy with propofol (N/A, 07/19/2015).   His family history includes Alcohol abuse in his mother; Diabetes in his mother; Mental retardation in his mother.He reports that he quit smoking about 35 years ago. His smoking use included cigarettes. He has a 15.00 pack-year smoking history. His smokeless tobacco use includes chew. He reports that he does not drink alcohol or use drugs.  Outpatient Medications Prior to Visit  Medication Sig Dispense Refill  . Cholecalciferol (VITAMIN D-3 PO) Take 5,000 Units by mouth daily. 5,00000 u in the morning, 2,000 u in the evening    . meloxicam (MOBIC) 15 MG tablet Take 15 mg by mouth daily as needed.     Marland Kitchen MILK THISTLE PO Take 1 tablet by mouth 2 (two) times daily.    . Multiple Vitamin (MULTIVITAMIN WITH MINERALS) TABS tablet Take 1 tablet by mouth 2 (two) times daily.    . NP THYROID 60 MG tablet TAKE ONE TABLET EVERY DAY 90 tablet 0  . OVER THE COUNTER MEDICATION Take 4 sprays by mouth at bedtime. Secretropin oral spray - growth hormone    . OVER THE COUNTER MEDICATION Take 1 capsule by mouth 3 (three) times daily.  Instaflex    . OVER THE COUNTER MEDICATION Take 1,000 mg by mouth daily. Turmeric with Meriva    . OVER THE COUNTER MEDICATION Take 3,000 mg by mouth daily. Omega Cure    . OVER THE COUNTER MEDICATION Take 250 mg by mouth daily. Bio Magnesium Citrate    . OVER THE COUNTER MEDICATION Take 20,000 mcg by mouth daily. Vitamin B12-methylfolate    . OVER THE COUNTER MEDICATION Take 1 tablet by mouth daily. Pure adrenal 400    . OVER THE COUNTER MEDICATION Take 800 mcg by mouth daily. Active B Folate    . OVER THE COUNTER MEDICATION Inject 50 Units as directed daily. Semorelin    . OVER THE COUNTER MEDICATION 2 tablets daily. VitaPrime    . OVER THE COUNTER MEDICATION 1,000 mg daily. Super Bio-C buffered    . OVER THE COUNTER MEDICATION 1,000 mg daily. L'Arginine    . OVER THE COUNTER MEDICATION 2 capsules 2 (two) times daily. SLF Forte    . OVER THE COUNTER MEDICATION 1,000 mg. Myomin    . progesterone (PROMETRIUM) 100 MG capsule Take 100 mg by mouth at bedtime.    . sildenafil (VIAGRA) 100 MG tablet Take 1 tablet (100 mg total) by mouth as needed for erectile dysfunction. 10 tablet 5  . TAURINE  PO Take 1,000 mg by mouth daily.     . Testosterone (TESTOPEL) 75 MG PLLT by Implant route.    . tadalafil (CIALIS) 20 MG tablet Take 1 tablet (20 mg total) by mouth daily as needed for erectile dysfunction. 20 tablet 3  . tamsulosin (FLOMAX) 0.4 MG CAPS capsule TAKE 1 CAPSULE EVERY DAY 90 capsule 1  . zolpidem (AMBIEN) 5 MG tablet TAKE 1/2 TO TAKE ONE TABLET BY MOUTH AT BEDTIME AS NEEDED FOR SLEEP 30 tablet 5  . ALPRAZolam (XANAX) 0.25 MG tablet Take 1 tablet (0.25 mg total) by mouth daily as needed for anxiety. (Patient not taking: Reported on 04/27/2019) 10 tablet 0  . levothyroxine (SYNTHROID, LEVOTHROID) 75 MCG tablet Take by mouth.    Marland Kitchen lisinopril (PRINIVIL,ZESTRIL) 2.5 MG tablet Take 1 tablet (2.5 mg total) by mouth daily. (Patient not taking: Reported on 04/27/2019) 90 tablet 0  . Progesterone  Micronized (PROGESTERONE PO) Take by mouth.     No facility-administered medications prior to visit.     Review of Systems   Patient denies headache, fevers, malaise, unintentional weight loss, skin rash, eye pain, sinus congestion and sinus pain, sore throat, dysphagia,  hemoptysis , cough, dyspnea, wheezing, chest pain, palpitations, orthopnea, edema, abdominal pain, nausea, melena, diarrhea, constipation, flank pain, dysuria, hematuria, urinary  Frequency, nocturia, numbness, tingling, seizures,  Focal weakness, Loss of consciousness,  Tremor, insomnia, depression, anxiety, and suicidal ideation.      Objective:  BP 140/78 (BP Location: Left Arm, Patient Position: Sitting, Cuff Size: Normal)   Pulse 76   Temp 97.8 F (36.6 C) (Temporal)   Resp 15   Ht 6' (1.829 m)   Wt 175 lb 3.2 oz (79.5 kg)   SpO2 98%   BMI 23.76 kg/m   Physical Exam  General appearance: alert, cooperative and appears stated age Ears: normal TM's and external ear canals both ears Throat: lips, mucosa, and tongue normal; teeth and gums normal Neck: no adenopathy, no carotid bruit, supple, symmetrical, trachea midline and thyroid not enlarged, symmetric, no tenderness/mass/nodules Back: symmetric, no curvature. ROM normal. No CVA tenderness. Lungs: clear to auscultation bilaterally Heart: regular rate and rhythm, S1, S2 normal, no murmur, click, rub or gallop Abdomen: soft, non-tender; bowel sounds normal; no masses,  no organomegaly Pulses: 2+ and symmetric Skin: Skin color, texture, turgor normal. No rashes or lesions Lymph nodes: Cervical, supraclavicular, and axillary nodes normal.   Assessment & Plan:   Problem List Items Addressed This Visit      Unprioritized   Anxiety disorder, unspecified    Aggravated by results of recent election and Manifested as irritability and intolerance to interruptions and noise.  Long discussion about natural ways to manage symptoms.  patient dissuaded from prn use of  alprazolam .       Benign prostatic hyperplasia with urinary obstruction    Managed with Flomax.  Cialis 5 mg daily started as well.  prefers to continue both. 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  Managed with Stoioff with  Testim  And use of daily cialis      Relevant  Medications   tadalafil (CIALIS) 20 MG tablet   Osteoarthrosis involving lower leg    Using meloxicam occasionally.        Osteoporosis    Advised to consider Prolia given several "stress fractures"  And T scores diagnostic of osteoporosis by 2017 DEXA.  repeat DEXA ordered       Prostate cancer screening    PSA has been  Normal., and he prefers to continue screening at his own expense  Lab Results  Component Value Date   PSA 1.7 02/17/2018   PSA 1.2 02/15/2017   PSA 0.8 04/09/2016         Thrombocytopenia ([facility])   Relevant Orders   CBC with Differential/Platelet   White coat syndrome with high blood pressure without hypertension    Home readings remain < 130/80. Renal function is normal, but he has microscopic protenuria.  He has declined use of ACE Inhibitor or ARB    Lab Results  Component Value Date   MICROALBUR 3.7 (H) 04/23/2018          Relevant Medications   tadalafil (CIALIS) 20 MG tablet    Other Visit Diagnoses    Vitamin D deficiency    -  Primary   Relevant Orders   Vitamin D 25 hydroxy   Mixed hyperlipidemia       Relevant Medications   tadalafil (CIALIS) 20 MG tablet   Other Relevant Orders   Lipid panel   Acquired hypothyroidism       Relevant Orders   TSH   Need for immunization against influenza       Relevant Orders   Flu Vaccine QUAD High  Dose(Fluad) (Completed)      I have discontinued Joneen Boers T. Misiaszek's levothyroxine and lisinopril. I am also having him start on erythromycin. Additionally, I am having him maintain his [place], OVER THE COUNTER MEDICATION, multivitamin with minerals, OVER THE COUNTER MEDICATION, Cholecalciferol (VITAMIN D-3 PO), TAURINE PO, OVER THE COUNTER MEDICATION, OVER THE COUNTER MEDICATION, MILK THISTLE PO, OVER THE COUNTER MEDICATION, OVER THE COUNTER MEDICATION, OVER THE COUNTER MEDICATION, OVER THE COUNTER MEDICATION, OVER THE COUNTER MEDICATION, OVER THE COUNTER MEDICATION, OVER THE COUNTER MEDICATION, OVER THE COUNTER MEDICATION, sildenafil, meloxicam, Testosterone, NP Thyroid, ALPRAZolam, progesterone, tadalafil, tamsulosin, and zolpidem.  Meds ordered this encounter  Medications  . erythromycin ophthalmic ointment    Sig: Place 1 application into the right eye 3 (three) times daily.    Dispense:  3.5 g    Refill:  0  . tadalafil (CIALIS) 20 MG tablet    Sig: Take 1 tablet (20 mg total) by mouth daily as needed for erectile dysfunction.    Dispense:  240 tablet    Refill:  0    PATIENT REQUESTING ONE YEAR SUPPLY , WILL PAY OUT OF POCKET  . tamsulosin (FLOMAX) 0.4 MG CAPS capsule    Sig: TAKE 1 CAPSULE EVERY DAY    Dispense:  365 capsule    Refill:  0    PATIENT REQUESTING ONE YEAR SUPPLY will pay out of pocket  . zolpidem (AMBIEN) 5 MG tablet    Sig: TAKE 1/2 TO TAKE ONE TABLET BY MOUTH AT BEDTIME AS NEEDED FOR SLEEP    Dispense:  48 tablet    Refill:  0    One year supply (patient averages 4 per month)  A total of 40 minutes was spent with patient more than half of which was spent in counseling patient on  the above mentioned issues , reviewing and explaining recent labs and imaging studies done, and coordination of care.  Medications Discontinued During This Encounter  Medication Reason  . levothyroxine (SYNTHROID, LEVOTHROID) 75 MCG tablet  Patient has not taken in last 30 days  . lisinopril (PRINIVIL,ZESTRIL) 2.5 MG tablet Patient has not taken in last 30 days  . Progesterone Micronized (PROGESTERONE PO) Duplicate  . tadalafil (CIALIS) 20 MG tablet Reorder  . tamsulosin (FLOMAX) 0.4 MG CAPS capsule Reorder  . zolpidem (AMBIEN) 5 MG tablet Reorder    Follow-up: No follow-ups on file.   [name], MD

## 2019-04-28 ENCOUNTER — Telehealth: Payer: Self-pay

## 2019-04-28 ENCOUNTER — Encounter: Payer: Self-pay | Admitting: Internal Medicine

## 2019-04-28 DIAGNOSIS — E673 Hypervitaminosis D: Secondary | ICD-10-CM

## 2019-04-28 LAB — LIPID PANEL
Cholesterol: 215 mg/dL — ABNORMAL HIGH (ref 0–200)
HDL: 72.6 mg/dL (ref >39.00)
LDL Cholesterol: 123 mg/dL — ABNORMAL HIGH (ref 0–99)
NonHDL: 142.24
Total CHOL/HDL Ratio: 3
Triglycerides: 96 mg/dL (ref 0.0–149.0)
VLDL: 19.2 mg/dL (ref 0.0–40.0)

## 2019-04-28 LAB — COMPREHENSIVE METABOLIC PANEL
ALT: 37 U/L (ref 0–53)
AST: 33 U/L (ref 0–37)
Albumin: 4.5 g/dL (ref 3.5–5.2)
Alkaline Phosphatase: 105 U/L (ref 39–117)
BUN: 17 mg/dL (ref 6–23)
CO2: 26 mEq/L (ref 19–32)
Calcium: 9.2 mg/dL (ref 8.4–10.5)
Chloride: 101 mEq/L (ref 96–112)
Creatinine, Ser: 0.95 mg/dL (ref 0.40–1.50)
GFR: 76.97 mL/min (ref >60.00)
Glucose, Bld: 99 mg/dL (ref 70–99)
Potassium: 4.1 mEq/L (ref 3.5–5.1)
Sodium: 137 mEq/L (ref 135–145)
Total Bilirubin: 0.8 mg/dL (ref 0.2–1.2)
Total Protein: 7.1 g/dL (ref 6.0–8.3)

## 2019-04-28 LAB — CBC WITH DIFFERENTIAL/PLATELET
Basophils Absolute: 0.1 10*3/uL (ref 0.0–0.1)
Basophils Relative: 0.8 % (ref 0.0–3.0)
Eosinophils Absolute: 0.2 10*3/uL (ref 0.0–0.7)
Eosinophils Relative: 3.6 % (ref 0.0–5.0)
HCT: 42.4 % (ref 39.0–52.0)
Hemoglobin: 14.4 g/dL (ref 13.0–17.0)
Lymphocytes Relative: 20.4 % (ref 12.0–46.0)
Lymphs Abs: 1.2 10*3/uL (ref 0.7–4.0)
MCHC: 34 g/dL (ref 30.0–36.0)
MCV: 99.2 fl (ref 78.0–100.0)
Monocytes Absolute: 0.5 10*3/uL (ref 0.1–1.0)
Monocytes Relative: 8 % (ref 3.0–12.0)
Neutro Abs: 4.1 10*3/uL (ref 1.4–7.7)
Neutrophils Relative %: 67.2 % (ref 43.0–77.0)
Platelets: 147 10*3/uL — ABNORMAL LOW (ref 150.0–400.0)
RBC: 4.28 Mil/uL (ref 4.22–5.81)
RDW: 11.7 % (ref 11.5–15.5)
WBC: 6.1 10*3/uL (ref 4.0–10.5)

## 2019-04-28 LAB — PSA, MEDICARE: PSA: 1.24 ng/ml (ref 0.10–4.00)

## 2019-04-28 LAB — TSH: TSH: 2.28 u[IU]/mL (ref 0.35–4.50)

## 2019-04-28 LAB — VITAMIN D 25 HYDROXY (VIT D DEFICIENCY, FRACTURES): VITD: 108.7 ng/mL (ref 30.00–100.00)

## 2019-04-28 MED ORDER — TAMSULOSIN HCL 0.4 MG PO CAPS: ORAL_CAPSULE | ORAL | 0 refills | Status: DC

## 2019-04-28 MED ORDER — ZOLPIDEM TARTRATE 5 MG PO TABS: ORAL_TABLET | ORAL | 0 refills | Status: DC

## 2019-04-28 MED ORDER — TADALAFIL 20 MG PO TABS: 20.0000 mg | ORAL_TABLET | Freq: Every day | ORAL | 0 refills | Status: DC | PRN

## 2019-04-28 NOTE — Telephone Encounter (Signed)
YOUR VITAMIN D IS HIGH  REMEMBER TOO MUCH VITAMIN D IS TOXIC TO LIVER AND BRAIN  SO STOP ALL VITAMIN D SUPPLEMENTS IMMEDIATELY   REPEAT LEVEL IN 3 WEEKS   WILL ALSO SEND VIA MYCHART

## 2019-04-28 NOTE — Telephone Encounter (Signed)
Patient advised and lab scheduled as ordered . Patient will stop all vitamin d.

## 2019-04-28 NOTE — Assessment & Plan Note (Addendum)
Advised to consider Prolia given several "stress fractures"  And T scores diagnostic of osteoporosis by 2017 DEXA.  repeat DEXA ordered

## 2019-04-28 NOTE — Assessment & Plan Note (Signed)
PSA has been  Normal., and he prefers to continue screening at his own expense  Lab Results  Component Value Date   PSA 1.7 02/17/2018   PSA 1.2 02/15/2017   PSA 0.8 04/09/2016

## 2019-04-28 NOTE — Telephone Encounter (Signed)
CRITICAL VALUE STICKER  CRITICAL VALUE:Vitamin D 108.70  RECEIVER (on-site recipient of call):MG  DATE & TIME NOTIFIED: 04/28/19 1:10pm  MESSENGER (representative from lab):  MD NOTIFIED: [place]  TIME OF NOTIFICATION:13:10  RESPONSE: read back critical value and recorded in critical value log. Routed to [place]. Fransisco Beau notified if further info needed as I will be at lunch

## 2019-04-28 NOTE — Addendum Note (Signed)
Addended by: [name] on: 04/28/2019 04:15 PM   Modules accepted: Orders

## 2019-04-28 NOTE — Assessment & Plan Note (Signed)
Aggravated by results of recent election and Manifested as irritability and intolerance to interruptions and noise.  Long discussion about natural ways to manage symptoms.  patient dissuaded from prn use of alprazolam .

## 2019-04-28 NOTE — Assessment & Plan Note (Addendum)
Home readings remain < 130/80. Renal function is normal, but he has microscopic protenuria.  He has declined use of ACE Inhibitor or ARB    Lab Results  Component Value Date   MICROALBUR 3.7 (H) 04/23/2018

## 2019-04-28 NOTE — Assessment & Plan Note (Signed)
Using meloxicam occasionally.

## 2019-04-28 NOTE — Assessment & Plan Note (Signed)
Secondary to hypogonadism.  Managed with Stoioff with  Testim  And use of daily cialis

## 2019-04-28 NOTE — Assessment & Plan Note (Signed)
Managed with Flomax.  Cialis 5 mg daily started as well.  prefers to continue both. Lab Results  Component Value Date   PSA 1.7 02/17/2018   PSA 1.2 02/15/2017   PSA 0.8 04/09/2016

## 2019-04-30 DIAGNOSIS — E291 Testicular hypofunction: Secondary | ICD-10-CM | POA: Diagnosis not present

## 2019-04-30 DIAGNOSIS — R635 Abnormal weight gain: Secondary | ICD-10-CM | POA: Diagnosis not present

## 2019-04-30 LAB — BASIC METABOLIC PANEL
BUN: 21 (ref 4–21)
CO2: 20 (ref 13–22)
CO2: 25 — AB (ref 13–22)
Chloride: 96 — AB (ref 99–108)
Creatinine: 0.8 (ref 0.6–1.3)
Creatinine: 1 (ref 0.6–1.3)
Glucose: 130
Potassium: 3.5 (ref 3.4–5.3)
Potassium: 4.2 (ref 3.4–5.3)
Sodium: 134 — AB (ref 137–147)
Sodium: 142 (ref 137–147)

## 2019-04-30 LAB — CBC AND DIFFERENTIAL
HCT: 38 — AB (ref 41–53)
HCT: 43 (ref 41–53)
Hemoglobin: 13 — AB (ref 13.5–17.5)
Hemoglobin: 15 (ref 13.5–17.5)
Neutrophils Absolute: 1
Neutrophils Absolute: 3
Platelets: 144 — AB (ref 150–399)
Platelets: 144 — AB (ref 150–399)
WBC: 3.4
WBC: 4.5

## 2019-04-30 LAB — COMPREHENSIVE METABOLIC PANEL
Albumin: 3.7 (ref 3.5–5.0)
Calcium: 8.6 — AB (ref 8.7–10.7)
GFR calc Af Amer: 90
GFR calc non Af Amer: 77

## 2019-04-30 LAB — HEPATIC FUNCTION PANEL: Alkaline Phosphatase: 125 (ref 25–125)

## 2019-04-30 LAB — IRON,TIBC AND FERRITIN PANEL
Ferritin: 490
Iron: 120

## 2019-04-30 LAB — HEMOGLOBIN A1C: Hemoglobin A1C: 5

## 2019-04-30 LAB — TSH: TSH: 1.84 (ref 0.41–5.90)

## 2019-04-30 LAB — PSA: PSA: 1.1

## 2019-04-30 LAB — LIPID PANEL: HDL: 76 — AB (ref 35–70)

## 2019-05-01 ENCOUNTER — Other Ambulatory Visit: Payer: Self-pay | Admitting: Internal Medicine

## 2019-05-01 DIAGNOSIS — M810 Age-related osteoporosis without current pathological fracture: Secondary | ICD-10-CM

## 2019-05-04 ENCOUNTER — Telehealth: Payer: Self-pay

## 2019-05-04 NOTE — Telephone Encounter (Signed)
Copied from [place] [id number]. Topic: Appointment Scheduling - Scheduling Inquiry for Clinic >> May 04, 2019  9:15 AM Tony Luna wrote: Reason for CRM: pt called in and stated that he was suppose to get Luna call and get the DEXA scan sch'd .  He also would like to get the Prolia injection setup.  He would like to get this injection.  Please advise [phone]

## 2019-05-05 DIAGNOSIS — G479 Sleep disorder, unspecified: Secondary | ICD-10-CM | POA: Diagnosis not present

## 2019-05-05 DIAGNOSIS — M2550 Pain in unspecified joint: Secondary | ICD-10-CM | POA: Diagnosis not present

## 2019-05-05 DIAGNOSIS — E291 Testicular hypofunction: Secondary | ICD-10-CM | POA: Diagnosis not present

## 2019-05-05 NOTE — Telephone Encounter (Signed)
Is patient to go on prolia?

## 2019-05-05 NOTE — Telephone Encounter (Signed)
Yes I thought I had sent you his chart after I saw him to start the process for Prolia  ,  And I ordered the DEXA scan on Dec 4

## 2019-05-07 NOTE — Telephone Encounter (Signed)
Pharmacist, community for [facility] [address]. Patient ID  [id number]

## 2019-05-11 ENCOUNTER — Ambulatory Visit
Admission: RE | Admit: 2019-05-11 | Discharge: 2019-05-11 | Disposition: A | Discharge status: Home / Self Care | Payer: PPO | Source: Ambulatory Visit | Attending: Internal Medicine | Admitting: Internal Medicine

## 2019-05-11 DIAGNOSIS — M8589 Other specified disorders of bone density and structure, multiple sites: Secondary | ICD-10-CM | POA: Diagnosis not present

## 2019-05-11 DIAGNOSIS — M810 Age-related osteoporosis without current pathological fracture: Secondary | ICD-10-CM | POA: Insufficient documentation

## 2019-05-11 NOTE — Telephone Encounter (Signed)
Spoke with pt to give him the number to call and have his bone density scan scheduled. Pt gave a verbal understanding and stated that he would call to schedule.

## 2019-05-11 NOTE — Telephone Encounter (Signed)
Patient called to say that he have not been called about his bone scan that was ordered by DR Derrel Nip. Please call patient at Ph# [phone]

## 2019-05-16 ENCOUNTER — Other Ambulatory Visit: Payer: Self-pay | Admitting: Internal Medicine

## 2019-05-18 DIAGNOSIS — Z03818 Encounter for observation for suspected exposure to other biological agents ruled out: Secondary | ICD-10-CM | POA: Diagnosis not present

## 2019-05-19 ENCOUNTER — Other Ambulatory Visit: Payer: Self-pay

## 2019-05-19 ENCOUNTER — Telehealth: Payer: Self-pay

## 2019-05-19 ENCOUNTER — Other Ambulatory Visit (INDEPENDENT_AMBULATORY_CARE_PROVIDER_SITE_OTHER): Payer: PPO

## 2019-05-19 DIAGNOSIS — E673 Hypervitaminosis D: Secondary | ICD-10-CM | POA: Diagnosis not present

## 2019-05-19 DIAGNOSIS — Z20822 Contact with and (suspected) exposure to covid-19: Secondary | ICD-10-CM

## 2019-05-19 LAB — SARS-COV-2 IGG: SARS-COV-2 IgG: 0.06

## 2019-05-19 LAB — VITAMIN D 25 HYDROXY (VIT D DEFICIENCY, FRACTURES): VITD: 107.65 ng/mL (ref 30.00–100.00)

## 2019-05-19 NOTE — Addendum Note (Signed)
Addended by: Zannie Cove on: 05/19/2019 09:01 AM   Modules accepted: Orders

## 2019-05-19 NOTE — Telephone Encounter (Signed)
Please confirm that he has stopped all vitamin D supplements,  His level is still very elevated by slightly lower than last time

## 2019-05-19 NOTE — Addendum Note (Signed)
Addended by: Zannie Cove on: 05/19/2019 08:58 AM   Modules accepted: Orders

## 2019-05-19 NOTE — Telephone Encounter (Signed)
CRITICAL VALUE STICKER  CRITICAL VALUE: Vitamin D 107.65  RECEIVER (on-site recipient of call): Zannie Cove   DATE & TIME NOTIFIED: 15:35  MESSENGER (representative from lab) MG  MD NOTIFIED: Derrel Nip  TIME OF NOTIFICATION:16:00  RESPONSE:

## 2019-05-20 NOTE — Telephone Encounter (Signed)
LMTCB

## 2019-05-26 NOTE — Telephone Encounter (Signed)
Spoke with pt and he stated that the only form of Vitamin D that he is getting is "sunshine". Pt stated that he is not taking any vitamin d supplements.

## 2019-06-05 ENCOUNTER — Telehealth: Payer: Self-pay | Admitting: Internal Medicine

## 2019-06-05 DIAGNOSIS — M810 Age-related osteoporosis without current pathological fracture: Secondary | ICD-10-CM

## 2019-06-05 MED ORDER — DENOSUMAB 60 MG/ML ~~LOC~~ SOSY: 60.0000 mg | PREFILLED_SYRINGE | Freq: Once | SUBCUTANEOUS | 1 refills | Status: AC

## 2019-06-05 NOTE — Telephone Encounter (Signed)
Prolia per [employer] is on formulary, patient will need to pick up at pharmacy and schedule appointment. For injection RN visit. I have notified patient and scheduled RN visit . Sent script for Prolia to pharmacy.

## 2019-06-11 ENCOUNTER — Ambulatory Visit: Payer: Self-pay

## 2019-06-11 ENCOUNTER — Ambulatory Visit (INDEPENDENT_AMBULATORY_CARE_PROVIDER_SITE_OTHER): Payer: PPO | Admitting: Internal Medicine

## 2019-06-11 ENCOUNTER — Other Ambulatory Visit: Payer: Self-pay

## 2019-06-11 VITALS — Ht 72.0 in | Wt 170.0 lb

## 2019-06-11 DIAGNOSIS — E673 Hypervitaminosis D: Secondary | ICD-10-CM | POA: Diagnosis not present

## 2019-06-11 DIAGNOSIS — K7460 Unspecified cirrhosis of liver: Secondary | ICD-10-CM

## 2019-06-11 DIAGNOSIS — M810 Age-related osteoporosis without current pathological fracture: Secondary | ICD-10-CM | POA: Diagnosis not present

## 2019-06-11 DIAGNOSIS — M1712 Unilateral primary osteoarthritis, left knee: Secondary | ICD-10-CM

## 2019-06-11 NOTE — Assessment & Plan Note (Signed)
Asymptomatic.  Supplements suspended in November.  PTH ordered with repeat level in 2 weeks

## 2019-06-11 NOTE — Assessment & Plan Note (Addendum)
Managed with infrequent use of meloxicam.  No gi symptoms with twice weekly use.

## 2019-06-11 NOTE — Progress Notes (Signed)
Telephone Note  This visit type was conducted due to national recommendations for restrictions regarding the COVID-19 pandemic (e.g. social distancing).  This format is felt to be most appropriate for this patient at this time.  All issues noted in this document were discussed and addressed.  No physical exam was performed (except for noted visual exam findings with Video Visits).   I connected with@ on 06/11/19 at  9:00 AM EST by  telephone and verified that I am speaking with the correct person using two identifiers. Location patient: home Location provider: work or home office Persons participating in the virtual visit: patient, provider  I discussed the limitations, risks, security and privacy concerns of performing an evaluation and management service by telephone and the availability of in person appointments. I also discussed with the patient that there may be a patient responsible charge related to this service. The patient expressed understanding and agreed to proceed.  Reason for visit: follow up  HPI:  Osteoporosis :  Patient is scheduled for Prolia injection next Tuesday  Discussed calcium requirements.    Osteoarthritis   Vitamin D  Supra therapeutic level  In  November ,   Rechecked in Dec and still elevated . denies tremor,  neuropathy  And any neurologic symptoms.  Has not taken vitamin d since November.  Left eye stye resolved   Joint pain   Managed with meloxicam twice weekly.  Using cbd oil,  Massage and soaks in hot tub to manage y     ROS: See pertinent positives and negatives per HPI.  Past Medical History:  Diagnosis Date  . Allergy   . Arthritis   . Complication of anesthesia    :"need catheter"  . Dysrhythmia    ? something told by reg dr  unable to say what it was  . Esophageal tear   . Hypothyroidism   . Pneumonia    hx  . 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 Rotator cuff tear     Past Surgical History:  Procedure Laterality Date   . blepharaplasty    . CAUTERY OF TURBINATES    . COLONOSCOPY WITH PROPOFOL N/A 07/19/2015   Procedure: COLONOSCOPY WITH PROPOFOL;  Surgeon: [name], MD;  Location: [facility] END[facility]COPY;  Service: Endoscopy;  Laterality: N/A;  . HERNIA REPAIR Left    inguinal herniorrhapy  . KNEE SURGERY     ? loose body/ chondral defect 67yrs  . ROTATOR CUFF REPAIR     left  . SPINE SURGERY    . TONSILLECTOMY    . 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 Alcohol abuse Mother   . Diabetes Mother   . Mental retardation Mother     SOCIAL HX:  reports that he quit smoking about 36 years ago. His smoking use included cigarettes. He has a 15.00 pack-year smoking history. His smokeless tobacco use includes chew. He reports that he does not drink alcohol or use drugs.   Current Outpatient Medications:  .  Cholecalciferol (VITAMIN D-3 PO), Take 5,000 Units by mouth daily. 5,00000 u in the morning, 2,000 u in the evening, Disp: , Rfl:  .  meloxicam (MOBIC) 15 MG tablet, Take 1 tablet (15 mg total) by mouth daily as needed for pain., Disp: 90 tablet, Rfl: 1 .  MILK THISTLE  PO, Take 1 tablet by mouth 2 (two) times daily., Disp: , Rfl:  .  Multiple Vitamin (MULTIVITAMIN WITH MINERALS) TABS tablet, Take 1 tablet by mouth 2 (two) times daily., Disp: , Rfl:  .  NP THYROID 60 MG tablet, TAKE ONE TABLET BY MOUTH EVERY DAY, Disp: 90 tablet, Rfl: 0 .  OVER THE COUNTER MEDICATION, Take 4 sprays by mouth at bedtime. Secretropin oral spray - growth hormone, Disp: , Rfl:  .  OVER THE COUNTER MEDICATION, Take 1 capsule by mouth 3 (three) times daily. Instaflex, Disp: , Rfl:  .  OVER THE COUNTER MEDICATION, Take 1,000 mg by mouth daily. Turmeric with Meriva, Disp: , Rfl:  .  OVER THE COUNTER MEDICATION, Take 3,000 mg by mouth daily. Omega Cure,  Disp: , Rfl:  .  OVER THE COUNTER MEDICATION, Take 250 mg by mouth daily. Bio Magnesium Citrate, Disp: , Rfl:  .  OVER THE COUNTER MEDICATION, Take 20,000 mcg by mouth daily. Vitamin B12-methylfolate, Disp: , Rfl:  .  OVER THE COUNTER MEDICATION, Take 1 tablet by mouth daily. Pure adrenal 400, Disp: , Rfl:  .  OVER THE COUNTER MEDICATION, Take 800 mcg by mouth daily. Active B Folate, Disp: , Rfl:  .  OVER THE COUNTER MEDICATION, Inject 50 Units as directed daily. Semorelin, Disp: , Rfl:  .  OVER THE COUNTER MEDICATION, 2 tablets daily. VitaPrime, Disp: , Rfl:  .  OVER THE COUNTER MEDICATION, 1,000 mg daily. Super Bio-C buffered, Disp: , Rfl:  .  OVER THE COUNTER MEDICATION, 1,000 mg daily. L'Arginine, Disp: , Rfl:  .  OVER THE COUNTER MEDICATION, 2 capsules 2 (two) times daily. SLF Forte, Disp: , Rfl:  .  OVER THE COUNTER MEDICATION, 1,000 mg. Myomin, Disp: , Rfl:  .  sildenafil (VIAGRA) 100 MG tablet, Take 1 tablet (100 mg total) by mouth as needed for erectile dysfunction., Disp: 10 tablet, Rfl: 5 .  tadalafil (CIALIS) 20 MG tablet, Take 1 tablet (20 mg total) by mouth daily as needed for erectile dysfunction., Disp: 240 tablet, Rfl: 0 .  tamsulosin (FLOMAX) 0.4 MG CAPS capsule, TAKE 1 CAPSULE EVERY DAY, Disp: 365 capsule, Rfl: 0 .  TAURINE PO, Take 1,000 mg by mouth daily. , Disp: , Rfl:  .  Testosterone (TESTOPEL) 75 MG PLLT, by Implant route., Disp: , Rfl:  .  zolpidem (AMBIEN) 5 MG tablet, TAKE 1/2 TO TAKE ONE TABLET BY MOUTH AT BEDTIME AS NEEDED FOR SLEEP, Disp: 48 tablet, Rfl: 0  EXAM:   General impression: alert, cooperative and articulate.  No signs of being in distress  Lungs: speech is fluent sentence length suggests that patient is not short of breath and not punctuated by cough, sneezing or sniffing. Marland Kitchen   Psych: affect normal.  speech is articulate and non pressured .  pleasant and cooperative, no obvious depression or anxiety, speech and thought processing grossly  intact  ASSESSMENT AND PLAN:  Discussed the following assessment and plan:  Hypervitaminosis D - Plan: VITAMIN D 25 Hydroxy (Vit-D Deficiency, Fractures), PTH, Intact and Calcium  Age-related osteoporosis without current pathological fracture  Cirrhosis of liver without ascites, unspecified hepatic cirrhosis type ([place]) - Plan: Comprehensive metabolic panel  Primary osteoarthritis of left knee  Hypervitaminosis D Asymptomatic.  Supplements suspended in November.  PTH ordered with repeat level in 2 weeks   Osteoporosis Patient to start Prolia in Jan 2021  Primary osteoarthritis of left knee Managed with infrequent use of meloxicam.  No gi symptoms with twice weekly  use.      I discussed the assessment and treatment plan with the patient. The patient was provided an opportunity to ask questions and all were answered. The patient agreed with the plan and demonstrated an understanding of the instructions.   The patient was advised to call back or seek an in-person evaluation if the symptoms worsen or if the condition fails to improve as anticipated.  I provided   22 minutes of non-face-to-face time during this encounter.   [name], MD

## 2019-06-11 NOTE — Assessment & Plan Note (Addendum)
Patient to start Prolia in Jan 2021

## 2019-06-15 ENCOUNTER — Other Ambulatory Visit: Payer: Self-pay

## 2019-06-15 ENCOUNTER — Ambulatory Visit (INDEPENDENT_AMBULATORY_CARE_PROVIDER_SITE_OTHER): Payer: PPO

## 2019-06-15 VITALS — Ht 72.0 in | Wt 170.0 lb

## 2019-06-15 DIAGNOSIS — Z0000 Encounter for general adult medical examination without abnormal findings: Secondary | ICD-10-CM | POA: Diagnosis not present

## 2019-06-15 NOTE — Patient Instructions (Addendum)
  Mr. Wroblewski , Thank you for taking time to come for your Medicare Wellness Visit. I appreciate your ongoing commitment to your health goals. Please review the following plan we discussed and let me know if I can assist you in the future.   These are the goals we discussed: Goals      Patient Stated   . DIET - INCREASE WATER INTAKE (pt-stated)       This is a list of the screening recommended for you and due dates:  Health Maintenance  Topic Date Due  . Colon Cancer Screening  12/07/2020  . Tetanus Vaccine  06/07/2027  . Flu Shot  Completed  . Pneumonia vaccines  Completed

## 2019-06-15 NOTE — Progress Notes (Addendum)
Subjective:   Tony Luna is a 79 y.o. male who presents for Medicare Annual/Subsequent preventive examination.  Review of Systems:  No ROS.  Medicare Wellness Virtual Visit.  Visual/audio telehealth visit, UTA vital signs.   Ht/Wt provided.  See social history for additional risk factors.   Cardiac Risk Factors include: advanced age (>30men, >27 women);male gender     Objective:    Vitals: Ht 6' (1.829 m)   Wt 170 lb (77.1 kg)   BMI 23.06 kg/m   Body mass index is 23.06 kg/m.  Advanced Directives 06/15/2019 06/09/2018 06/06/2017 01/10/2017 05/29/2016 07/19/2015 06/14/2014  Does Patient Have a Medical Advance Directive? Yes Yes Yes Yes Yes Yes Yes  Type of Paramedic of [place];Living will [place];Living will;Out of facility DNR (pink MOST or yellow form) Living will;Healthcare Power of [place];Living will [place];Living will - Living will;Healthcare Power of Attorney  Does patient want to make changes to medical advance directive? No - Patient declined No - Patient declined No - Patient declined - No - Patient declined - No - Patient declined  Copy of [place] in Chart? Yes - validated most recent copy scanned in chart (See row information) Yes - validated most recent copy scanned in chart (See row information) No - copy requested - Yes - Yes    Tobacco Social History   Tobacco Use  Smoking Status Former Smoker   Packs/day: 1.00   Years: 15.00   Pack years: 15.00   Types: Cigarettes   Quit date: 06/15/1983   Years since quitting: 36.0  Smokeless Tobacco Current User   Types: Chew     Ready to quit: Not Answered Counseling given: Not Answered   Clinical Intake:  Pre-visit preparation completed: Yes        Diabetes: No  How often do you need to have someone help you when you read instructions, pamphlets, or other written materials from your doctor or  pharmacy?: 1 - Never  Interpreter Needed?: No     Past Medical History:  Diagnosis Date   Allergy    Arthritis    Complication of anesthesia    :"need catheter"   Dysrhythmia    ? something told by reg dr  unable to say what it was   Esophageal tear    Hypothyroidism    Pneumonia    hx   Primary osteoarthritis of left knee 06/22/2014   Rotator cuff tear    Past Surgical History:  Procedure Laterality Date   blepharaplasty     CAUTERY OF TURBINATES     COLONOSCOPY WITH PROPOFOL N/A 07/19/2015   Procedure: COLONOSCOPY WITH PROPOFOL;  Surgeon: [name], MD;  Location: [facility] END[facility]COPY;  Service: Endoscopy;  Laterality: N/A;   HERNIA REPAIR Left    inguinal herniorrhapy   KNEE SURGERY     ? 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/2014   Procedure: TOTAL KNEE ARTHROPLASTY;  Surgeon: [name], MD;  Location: [place];  Service: Orthopedics;  Laterality: Left;   Family History  Problem Relation Age of Onset   Alcohol abuse Mother    Diabetes Mother    Mental retardation Mother    Social History   Socioeconomic History   Marital status: Married    Spouse name: Not  on file   Number of children: 2   Years of education: Not on file   Highest education level: Not on file  Occupational History   Occupation: textiles  Tobacco Use   Smoking status: Former Smoker    Packs/day: 1.00    Years: 15.00    Pack years: 15.00    Types: Cigarettes    Quit date: 06/15/1983    Years since quitting: 36.0   Smokeless tobacco: Current User    Types: Chew  Substance and Sexual Activity   Alcohol use: Yes    Comment: every once in awhile   Drug use: No   Sexual activity: Not Currently  Other Topics Concern   Not on file  Social History Narrative   [employer] graduate   [place]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   Very active   Walks 1 hour daily   Social Determinants of Health   Financial Resource Strain: Low  Risk    Difficulty of Paying Living Expenses: Not hard at all  Food Insecurity: No Food Insecurity   Worried About Charity fundraiser in the Last Year: Never true   Arboriculturist in the Last Year: Never true  Transportation Needs: No Transportation Needs   Lack of Transportation (Medical): No   Lack of Transportation (Non-Medical): No  Physical Activity: Sufficiently Active   Days of Exercise per Week: 7 days   Minutes of Exercise per Session: 60 min  Stress: No Stress Concern Present   Feeling of Stress : Not at all  Social Connections: Not Isolated   Frequency of Communication with Friends and Family: More than three times a week   Frequency of Social Gatherings with Friends and Family: More than three times a week   Attends Religious Services: 1 to 4 times per year   Active Member of [employer] or Organizations: Yes   Attends Archivist Meetings: 1 to 4 times per year   Marital Status: Married    Outpatient Encounter Medications as of 06/15/2019  Medication Sig   Cholecalciferol (VITAMIN D-3 PO) Take 5,000 Units by mouth daily. 5,00000 u in the morning, 2,000 u in the evening   meloxicam (MOBIC) 15 MG tablet Take 1 tablet (15 mg total) by mouth daily as needed for pain.   MILK THISTLE PO Take 1 tablet by mouth 2 (two) times daily.   Multiple Vitamin (MULTIVITAMIN WITH MINERALS) TABS tablet Take 1 tablet by mouth 2 (two) times daily.   NP THYROID 60 MG tablet TAKE ONE TABLET BY MOUTH EVERY DAY   OVER THE COUNTER MEDICATION Take 4 sprays by mouth at bedtime. Secretropin oral spray - growth hormone   OVER THE COUNTER MEDICATION Take 1 capsule by mouth 3 (three) times daily. Instaflex   OVER THE COUNTER MEDICATION Take 1,000 mg by mouth daily. Turmeric with Meriva   OVER THE COUNTER MEDICATION Take 3,000 mg by mouth daily. Omega Cure   OVER THE COUNTER MEDICATION Take 250 mg by mouth daily. Bio Magnesium Citrate   OVER THE COUNTER MEDICATION Take 20,000 mcg by mouth daily.  Vitamin B12-methylfolate   OVER THE COUNTER MEDICATION Take 1 tablet by mouth daily. Pure adrenal 400   OVER THE COUNTER MEDICATION Take 800 mcg by mouth daily. Active B Folate   OVER THE COUNTER MEDICATION Inject 50 Units as directed daily. Semorelin   OVER THE COUNTER MEDICATION 2 tablets daily. VitaPrime   OVER THE COUNTER MEDICATION 1,000 mg daily. Super Bio-C buffered   OVER THE COUNTER MEDICATION  1,000 mg daily. L'Arginine   OVER THE COUNTER MEDICATION 2 capsules 2 (two) times daily. SLF Forte   OVER THE COUNTER MEDICATION 1,000 mg. Myomin   sildenafil (VIAGRA) 100 MG tablet Take 1 tablet (100 mg total) by mouth as needed for erectile dysfunction.   tadalafil (CIALIS) 20 MG tablet Take 1 tablet (20 mg total) by mouth daily as needed for erectile dysfunction.   tamsulosin (FLOMAX) 0.4 MG CAPS capsule TAKE 1 CAPSULE EVERY DAY   TAURINE PO Take 1,000 mg by mouth daily.    Testosterone (TESTOPEL) 75 MG PLLT by Implant route.   zolpidem (AMBIEN) 5 MG tablet TAKE 1/2 TO TAKE ONE TABLET BY MOUTH AT BEDTIME AS NEEDED FOR SLEEP   No facility-administered encounter medications on file as of 06/15/2019.    Activities of Daily Living In your present state of health, do you have any difficulty performing the following activities: 06/15/2019  Hearing? Y  Comment Hearing aids  Vision? N  Difficulty concentrating or making decisions? N  Walking or climbing stairs? N  Dressing or bathing? N  Doing errands, shopping? N  Preparing Food and eating ? N  Using the Toilet? N  In the past six months, have you accidently leaked urine? N  Do you have problems with loss of bowel control? N  Managing your Medications? N  Managing your Finances? N  Housekeeping or managing your Housekeeping? N  Some recent data might be hidden    Patient Care Team: [name], MD as PCP - General (Internal Medicine)   Assessment:   This is a routine wellness examination for [place].  Nurse connected with  patient 06/15/19 at 11:30 AM EST by a telephone enabled telemedicine application and verified that I am speaking with the correct person using two identifiers. Patient stated full name and DOB. Patient gave permission to continue with virtual visit. Patient's location was at home and Nurse's location was at [place] office.   Patient is alert and oriented x3. Patient denies difficulty focusing or concentrating. Patient likes to stay physically active and takes sharp salt supplements for brain stimulation.   Health Maintenance Due: See completed HM at the end of note.   Eye: Visual acuity not assessed. Virtual visit. Followed by their ophthalmologist.  Dental: Visits every 6 months.    Hearing: Hearing aids- yes  Safety:  Patient feels safe at home- yes Patient does have smoke detectors at home- yes Patient does wear sunscreen or protective clothing when in direct sunlight - yes Patient does wear seat belt when in a moving vehicle - yes Patient drives- yes Adequate lighting in walkways free from debris- yes Grab bars and handrails used as appropriate- yes Ambulates with an assistive device- no Cell phone on person when ambulating outside of the home- yes  Social: Alcohol intake - yes, occ wine Tobacco use- current; chew Illicit drug use? none  Medication: Taking as directed and without issues.  Pill box in use -yes  Self managed - yes   Covid-19: Precautions and sickness symptoms discussed. Wears mask, social distancing, hand hygiene as appropriate.   Activities of Daily Living Patient denies needing assistance with: household chores, feeding themselves, getting from bed to chair, getting to the toilet, bathing/showering, dressing, managing money, or preparing meals.   Discussed the importance of a healthy diet, water intake and the benefits of aerobic exercise.   Physical activity- gym exercises with weights 3 times weekly, 60 minutes. Elliptical sprints 25 min, light  arm/shoulder strengthening exercises.  Diet:  Regular Water: fair intake Caffeine: 2 cups of coffee  Other Providers Patient Care Team: [name], MD as PCP - General (Internal Medicine) Exercise Activities and Dietary recommendations Current Exercise Habits: Home exercise routine, Type of exercise: stretching;walking;calisthenics;strength training/weights, Intensity: Mild  Goals       Patient Stated    DIET - INCREASE WATER INTAKE (pt-stated)        Fall Risk Fall Risk  06/15/2019 06/11/2019 04/27/2019 06/09/2018 04/26/2018  Falls in the past year? 0 0 0 0 0  Injury with Fall? - - - - 0  Follow up Falls evaluation completed Falls evaluation completed Falls evaluation completed - -   Timed Get Up and Go Performed: no, virtual visit  Depression Screen PHQ 2/9 Scores 06/15/2019 06/09/2018 04/26/2018 06/06/2017  PHQ - 2 Score 0 0 0 0  PHQ- 9 Score - - - 0    Cognitive Function MMSE - Mini Mental State Exam 06/15/2019  Not completed: Unable to complete     6CIT Screen 06/09/2018 06/06/2017 05/29/2016  What Year? 0 points 0 points 0 points  What month? 0 points 0 points 0 points  What time? 0 points 0 points 0 points  Count back from 20 0 points 0 points 0 points  Months in reverse 0 points 0 points 0 points  Repeat phrase 0 points 0 points 0 points  Total Score 0 0 0    Immunization History  Administered Date(s) Administered   Fluad Quad(high Dose 65+) 04/27/2019   Hep A / Hep B 12/13/2015, 01/17/2016, 06/26/2016   Influenza Whole 02/26/2008   Influenza, High Dose Seasonal PF 04/23/2018   Influenza,inj,Quad PF,6+ Mos 03/12/2013, 02/24/2014   Influenza-Unspecified 02/09/2016   Pneumococcal Conjugate-13 08/06/2013   Pneumococcal Polysaccharide-23 05/24/2011   Td 05/28/2004   Tdap 02/13/2006, 06/06/2017   Zoster 05/30/2007   Screening Tests Health Maintenance  Topic Date Due   COLONOSCOPY  12/07/2020   TETANUS/TDAP  06/07/2027   INFLUENZA VACCINE  Completed     PNA vac Low Risk Adult  Completed       Plan:   Keep all routine maintenance appointments.   Next scheduled lab 3:30 with prolia.  Follow up 06/16/19 @ 3:15.  Medicare Attestation I have personally reviewed: The patient's medical and social history Their use of alcohol, tobacco or illicit drugs Their current medications and supplements The patient's functional ability including ADLs,fall risks, home safety risks, cognitive, and hearing and visual impairment Diet and physical activities Evidence for depression    I have reviewed and discussed with patient certain preventive protocols, quality metrics, and best practice recommendations.      OBrien-Blaney, Cova Knieriem L, LPN  [id number]   I have reviewed the above information and agree with above.   Deborra Medina, MD

## 2019-06-16 ENCOUNTER — Other Ambulatory Visit (INDEPENDENT_AMBULATORY_CARE_PROVIDER_SITE_OTHER): Payer: PPO

## 2019-06-16 ENCOUNTER — Other Ambulatory Visit: Payer: Self-pay

## 2019-06-16 ENCOUNTER — Ambulatory Visit (INDEPENDENT_AMBULATORY_CARE_PROVIDER_SITE_OTHER): Payer: PPO | Admitting: Lab

## 2019-06-16 DIAGNOSIS — M810 Age-related osteoporosis without current pathological fracture: Secondary | ICD-10-CM

## 2019-06-16 DIAGNOSIS — E673 Hypervitaminosis D: Secondary | ICD-10-CM

## 2019-06-16 DIAGNOSIS — K7460 Unspecified cirrhosis of liver: Secondary | ICD-10-CM

## 2019-06-16 MED ORDER — DENOSUMAB 60 MG/ML ~~LOC~~ SOSY
60.0000 mg | PREFILLED_SYRINGE | Freq: Once | SUBCUTANEOUS | Status: AC
  Administered 2019-06-16: 60 mg via SUBCUTANEOUS

## 2019-06-16 NOTE — Progress Notes (Signed)
Pt in office today for Prolia injection in R- arm. Pt tolerated well.

## 2019-06-17 LAB — PTH, INTACT AND CALCIUM
Calcium: 9 mg/dL (ref 8.6–10.3)
PTH: 25 pg/mL (ref 14–64)

## 2019-06-17 LAB — COMPREHENSIVE METABOLIC PANEL
ALT: 29 U/L (ref 0–53)
AST: 30 U/L (ref 0–37)
Albumin: 4.2 g/dL (ref 3.5–5.2)
Alkaline Phosphatase: 87 U/L (ref 39–117)
BUN: 26 mg/dL — ABNORMAL HIGH (ref 6–23)
CO2: 26 mEq/L (ref 19–32)
Calcium: 9 mg/dL (ref 8.4–10.5)
Chloride: 104 mEq/L (ref 96–112)
Creatinine, Ser: 0.96 mg/dL (ref 0.40–1.50)
GFR: 76.02 mL/min (ref >60.00)
Glucose, Bld: 96 mg/dL (ref 70–99)
Potassium: 4 mEq/L (ref 3.5–5.1)
Sodium: 140 mEq/L (ref 135–145)
Total Bilirubin: 0.8 mg/dL (ref 0.2–1.2)
Total Protein: 6.8 g/dL (ref 6.0–8.3)

## 2019-06-17 LAB — VITAMIN D 25 HYDROXY (VIT D DEFICIENCY, FRACTURES): VITD: 99.31 ng/mL (ref 30.00–100.00)

## 2019-06-23 ENCOUNTER — Other Ambulatory Visit: Payer: Self-pay | Admitting: Internal Medicine

## 2019-07-08 ENCOUNTER — Other Ambulatory Visit: Payer: Self-pay

## 2019-07-24 DIAGNOSIS — R718 Other abnormality of red blood cells: Secondary | ICD-10-CM | POA: Diagnosis not present

## 2019-07-25 ENCOUNTER — Other Ambulatory Visit: Payer: Self-pay | Admitting: Internal Medicine

## 2019-07-30 DIAGNOSIS — G479 Sleep disorder, unspecified: Secondary | ICD-10-CM | POA: Diagnosis not present

## 2019-07-30 DIAGNOSIS — R6882 Decreased libido: Secondary | ICD-10-CM | POA: Diagnosis not present

## 2019-07-30 DIAGNOSIS — E291 Testicular hypofunction: Secondary | ICD-10-CM | POA: Diagnosis not present

## 2019-08-03 DIAGNOSIS — Z1159 Encounter for screening for other viral diseases: Secondary | ICD-10-CM | POA: Diagnosis not present

## 2019-08-13 DIAGNOSIS — L570 Actinic keratosis: Secondary | ICD-10-CM | POA: Diagnosis not present

## 2019-08-13 DIAGNOSIS — L821 Other seborrheic keratosis: Secondary | ICD-10-CM | POA: Diagnosis not present

## 2019-08-13 DIAGNOSIS — Z08 Encounter for follow-up examination after completed treatment for malignant neoplasm: Secondary | ICD-10-CM | POA: Diagnosis not present

## 2019-08-13 DIAGNOSIS — Z09 Encounter for follow-up examination after completed treatment for conditions other than malignant neoplasm: Secondary | ICD-10-CM | POA: Diagnosis not present

## 2019-08-13 DIAGNOSIS — X32XXXA Exposure to sunlight, initial encounter: Secondary | ICD-10-CM | POA: Diagnosis not present

## 2019-08-13 DIAGNOSIS — Z85828 Personal history of other malignant neoplasm of skin: Secondary | ICD-10-CM | POA: Diagnosis not present

## 2019-09-02 ENCOUNTER — Other Ambulatory Visit: Payer: Self-pay | Admitting: Internal Medicine

## 2019-09-02 NOTE — Telephone Encounter (Signed)
Refill request for Ambien, last seen 06-11-19, last filled 04-28-19.  Please advise.

## 2019-10-20 ENCOUNTER — Other Ambulatory Visit: Payer: Self-pay | Admitting: Internal Medicine

## 2019-10-20 DIAGNOSIS — E291 Testicular hypofunction: Secondary | ICD-10-CM | POA: Diagnosis not present

## 2019-10-20 NOTE — Telephone Encounter (Signed)
Refill request for viagra, last seen 06-11-19, last filled 06-23-19.  Please advise.

## 2019-10-27 DIAGNOSIS — G479 Sleep disorder, unspecified: Secondary | ICD-10-CM | POA: Diagnosis not present

## 2019-10-27 DIAGNOSIS — D696 Thrombocytopenia, unspecified: Secondary | ICD-10-CM | POA: Diagnosis not present

## 2019-10-27 DIAGNOSIS — M79674 Pain in right toe(s): Secondary | ICD-10-CM | POA: Diagnosis not present

## 2019-10-27 DIAGNOSIS — R6882 Decreased libido: Secondary | ICD-10-CM | POA: Diagnosis not present

## 2019-10-27 DIAGNOSIS — G5761 Lesion of plantar nerve, right lower limb: Secondary | ICD-10-CM | POA: Diagnosis not present

## 2019-10-27 DIAGNOSIS — E291 Testicular hypofunction: Secondary | ICD-10-CM | POA: Diagnosis not present

## 2019-11-02 DIAGNOSIS — S83241A Other tear of medial meniscus, current injury, right knee, initial encounter: Secondary | ICD-10-CM | POA: Diagnosis not present

## 2019-11-02 DIAGNOSIS — M2351 Chronic instability of knee, right knee: Secondary | ICD-10-CM | POA: Diagnosis not present

## 2019-11-03 ENCOUNTER — Other Ambulatory Visit: Payer: Self-pay | Admitting: Internal Medicine

## 2019-11-03 DIAGNOSIS — Z01818 Encounter for other preprocedural examination: Secondary | ICD-10-CM

## 2019-11-03 DIAGNOSIS — E291 Testicular hypofunction: Secondary | ICD-10-CM

## 2019-11-03 DIAGNOSIS — K7460 Unspecified cirrhosis of liver: Secondary | ICD-10-CM

## 2019-11-03 DIAGNOSIS — E673 Hypervitaminosis D: Secondary | ICD-10-CM

## 2019-11-04 ENCOUNTER — Ambulatory Visit (INDEPENDENT_AMBULATORY_CARE_PROVIDER_SITE_OTHER): Payer: PPO

## 2019-11-04 ENCOUNTER — Other Ambulatory Visit: Payer: Self-pay

## 2019-11-04 ENCOUNTER — Other Ambulatory Visit: Payer: Self-pay | Admitting: Internal Medicine

## 2019-11-04 ENCOUNTER — Ambulatory Visit (INDEPENDENT_AMBULATORY_CARE_PROVIDER_SITE_OTHER): Payer: PPO | Admitting: Internal Medicine

## 2019-11-04 ENCOUNTER — Encounter: Payer: Self-pay | Admitting: Internal Medicine

## 2019-11-04 DIAGNOSIS — E291 Testicular hypofunction: Secondary | ICD-10-CM | POA: Diagnosis not present

## 2019-11-04 DIAGNOSIS — E673 Hypervitaminosis D: Secondary | ICD-10-CM

## 2019-11-04 DIAGNOSIS — Z01818 Encounter for other preprocedural examination: Secondary | ICD-10-CM

## 2019-11-04 DIAGNOSIS — K7460 Unspecified cirrhosis of liver: Secondary | ICD-10-CM

## 2019-11-04 NOTE — Patient Instructions (Signed)
Your EKG is fine.  No contraindications to surgery   as long as your platelets and coags are stable/normal,  There should be no problems

## 2019-11-04 NOTE — Progress Notes (Signed)
Subjective:  Patient ID: Tony Luna, male    DOB: 14-Nov-1940  Age: 79 y.o. MRN: [id number]  CC: Diagnoses of Cirrhosis of liver without ascites, unspecified hepatic cirrhosis type ([place]), Hypervitaminosis D, Hypogonadism in male, Preoperative evaluation to rule out surgical contraindication, and Preoperative evaluation of a medical condition to rule out surgical contraindications (TAR required) were pertinent to this visit.  HPI Tony Luna presents for preoperative clearance, requested June 8 by his  orthopedist, Para March for planned arthroscopy and repair  on June 11 after a slip on Saturday night  that hyperflexed his knee and reportedly tore his quadriceps muscle off of his patella.He is accompanied by his wife. He is wearing a knee stabilizer and ambulating relatively well without pain unless he shits his weight suddenly  He has no known history of CAD.  He Denies chest pain in the last 6 weeks,  No shortness of breath.  No history of anaphylaxis or adverse reaction to anesthesis    He has a history of cirrhosis,  Splenomegaly, thrombocytopenia, and white coat hypertension,  but has had no known history of alcohol abuse,  diabetes or CKD.    Outpatient Medications Prior to Visit  Medication Sig Dispense Refill  . Cholecalciferol (VITAMIN D-3 PO) Take 5,000 Units by mouth daily. 5,00000 u in the morning, 2,000 u in the evening    . meloxicam (MOBIC) 15 MG tablet Take 1 tablet (15 mg total) by mouth daily as needed for pain. 90 tablet 1  . MILK THISTLE PO Take 1 tablet by mouth 2 (two) times daily.    . Multiple Vitamin (MULTIVITAMIN WITH MINERALS) TABS tablet Take 1 tablet by mouth 2 (two) times daily.    . NP THYROID 60 MG tablet TAKE ONE TABLET BY MOUTH EVERY DAY 90 tablet 0  . OVER THE COUNTER MEDICATION Take 4 sprays by mouth at bedtime. Secretropin oral spray - growth hormone    . OVER THE COUNTER MEDICATION Take 1 capsule by mouth 3 (three) times daily. Instaflex    . OVER THE  COUNTER MEDICATION Take 1,000 mg by mouth daily. Turmeric with Meriva    . OVER THE COUNTER MEDICATION Take 3,000 mg by mouth daily. Omega Cure    . OVER THE COUNTER MEDICATION Take 250 mg by mouth daily. Bio Magnesium Citrate    . OVER THE COUNTER MEDICATION Take 20,000 mcg by mouth daily. Vitamin B12-methylfolate    . OVER THE COUNTER MEDICATION Take 1 tablet by mouth daily. Pure adrenal 400    . OVER THE COUNTER MEDICATION Take 800 mcg by mouth daily. Active B Folate    . OVER THE COUNTER MEDICATION Inject 50 Units as directed daily. Semorelin    . OVER THE COUNTER MEDICATION 2 tablets daily. VitaPrime    . OVER THE COUNTER MEDICATION 1,000 mg daily. Super Bio-C buffered    . OVER THE COUNTER MEDICATION 1,000 mg daily. L'Arginine    . OVER THE COUNTER MEDICATION 2 capsules 2 (two) times daily. SLF Forte    . OVER THE COUNTER MEDICATION 1,000 mg. Myomin    . sildenafil (VIAGRA) 100 MG tablet Take 1 tablet (100 mg total) by mouth as needed for erectile dysfunction. 10 tablet 5  . sildenafil (VIAGRA) 50 MG tablet USE AS DIRECTED 90 tablet 0  . tadalafil (CIALIS) 20 MG tablet Take 1 tablet (20 mg total) by mouth daily as needed for erectile dysfunction. 240 tablet 0  . tamsulosin (FLOMAX) 0.4 MG CAPS capsule TAKE  1 CAPSULE EVERY DAY 90 capsule 0  . TAURINE PO Take 1,000 mg by mouth daily.     . Testosterone (TESTOPEL) 75 MG PLLT by Implant route.    Marland Kitchen zolpidem (AMBIEN) 5 MG tablet TAKE 1/2-1 TABLET BY MOUTH AT BEDTIME ASNEEDED FOR SLEEP 48 tablet 3   No facility-administered medications prior to visit.    Review of Systems;  Patient denies headache, fevers, malaise, unintentional weight loss, skin rash, eye pain, sinus congestion and sinus pain, sore throat, dysphagia,  hemoptysis , cough, dyspnea, wheezing, chest pain, palpitations, orthopnea, edema, abdominal pain, nausea, melena, diarrhea, constipation, flank pain, dysuria, hematuria, urinary  Frequency, nocturia, numbness, tingling,  seizures,  Focal weakness, Loss of consciousness,  Tremor, insomnia, depression, anxiety, and suicidal ideation.      Objective:  BP 138/60 (BP Location: Left Arm, Patient Position: Sitting, Cuff Size: Normal)   Pulse 95   Resp 15   Ht 6' (1.829 m)   Wt 165 lb (74.8 kg)   SpO2 95%   BMI 22.38 kg/m   BP Readings from Last 3 Encounters:  11/04/19 138/60  04/27/19 140/78  04/08/19 (!) 144/63    Wt Readings from Last 3 Encounters:  11/04/19 165 lb (74.8 kg)  06/15/19 170 lb (77.1 kg)  06/11/19 170 lb (77.1 kg)    General appearance: alert, cooperative and appears stated age Ears: normal TM's and external ear canals both ears Throat: lips, mucosa, and tongue normal; teeth and gums normal Neck: no adenopathy, no carotid bruit, supple, symmetrical, trachea midline and thyroid not enlarged, symmetric, no tenderness/mass/nodules Back: symmetric, no curvature. ROM normal. No CVA tenderness. Lungs: clear to auscultation bilaterally Heart: regular rate and rhythm, S1, S2 normal, no murmur, click, rub or gallop Abdomen: soft, non-tender; bowel sounds normal; no masses,  no organomegaly Pulses: 2+ and symmetric Skin: Skin color, texture, turgor normal. No rashes or lesions Lymph nodes: Cervical, supraclavicular, and axillary nodes normal.  Lab Results  Component Value Date   HGBA1C 5.0 04/30/2019   HGBA1C 4.9 02/17/2018   HGBA1C 5.0 06/06/2017    Lab Results  Component Value Date   CREATININE 1.12 11/04/2019   CREATININE 0.96 06/16/2019   CREATININE 0.8 04/30/2019   CREATININE 1.0 04/30/2019    Lab Results  Component Value Date   WBC 5.7 11/04/2019   HGB 13.7 11/04/2019   HCT 39.2 11/04/2019   PLT 181.0 11/04/2019   GLUCOSE 144 (H) 11/04/2019   CHOL 215 (H) 04/27/2019   TRIG 96.0 04/27/2019   HDL 76 (A) 04/30/2019   LDLCALC 123 (H) 04/27/2019   ALT 38 11/04/2019   AST 43 (H) 11/04/2019   NA 140 11/04/2019   K 3.9 11/04/2019   CL 103 11/04/2019   CREATININE 1.12  11/04/2019   BUN 29 (H) 11/04/2019   CO2 28 11/04/2019   TSH 1.38 11/04/2019   PSA 1.1 04/30/2019   INR 1.0 11/04/2019   HGBA1C 5.0 04/30/2019   MICROALBUR 3.7 (H) 04/23/2018     Assessment & Plan:   Problem List Items Addressed This Visit      Unprioritized   Cirrhosis ([place])   Hypervitaminosis D    His level is lower with suspension of supplementation      Hypogonadism in male   Preoperative evaluation of a medical condition to rule out surgical contraindications (TAR required)    I have ordered and reviewed a 12 lead EKG and find that there are no acute changes and patient is in sinus rhythm.  Preliminary chest x ray is normal (ready by me) .   Platelets,  Coags,  Liver and kidney function,  Cbc and thyroid function are all normal.  There are no contraindications to planned surgery tomorrow.  Lab Results  Component Value Date   WBC 5.7 11/04/2019   HGB 13.7 11/04/2019   HCT 39.2 11/04/2019   MCV 96.0 11/04/2019   PLT 181.0 11/04/2019   Lab Results  Component Value Date   CREATININE 1.12 11/04/2019   Lab Results  Component Value Date   INR 1.0 11/04/2019   INR 1.08 06/14/2014   No results found for: PTT        Other Visit Diagnoses    Preoperative evaluation to rule out surgical contraindication         A total of 45  minutes was spent with patient more than half of which was spent in counseling patient on the above mentioned issues , reviewing and explaining recent labs and imaging studies done, and coordination of care. I am having Imagene Riches maintain his OVER THE COUNTER MEDICATION, OVER THE COUNTER MEDICATION, OVER THE COUNTER MEDICATION, multivitamin with minerals, OVER THE COUNTER MEDICATION, Cholecalciferol (VITAMIN D-3 PO), TAURINE PO, OVER THE COUNTER MEDICATION, OVER THE COUNTER MEDICATION, MILK THISTLE PO, OVER THE COUNTER MEDICATION, OVER THE COUNTER MEDICATION, OVER THE COUNTER MEDICATION, OVER THE COUNTER MEDICATION, OVER THE COUNTER  MEDICATION, OVER THE COUNTER MEDICATION, OVER THE COUNTER MEDICATION, OVER THE COUNTER MEDICATION, sildenafil, Testosterone, meloxicam, tadalafil, NP Thyroid, tamsulosin, zolpidem, and sildenafil.  No orders of the defined types were placed in this encounter.   There are no discontinued medications.  Follow-up: No follow-ups on file.   [name], MD

## 2019-11-04 NOTE — Addendum Note (Signed)
Addended by: Tor Netters I on: 11/04/2019 04:02 PM   Modules accepted: Orders

## 2019-11-04 NOTE — Addendum Note (Signed)
Addended by: Tor Netters I on: 11/04/2019 04:00 PM   Modules accepted: Orders

## 2019-11-05 DIAGNOSIS — Z01818 Encounter for other preprocedural examination: Secondary | ICD-10-CM | POA: Insufficient documentation

## 2019-11-05 LAB — BASIC METABOLIC PANEL
BUN: 29 mg/dL — ABNORMAL HIGH (ref 6–23)
CO2: 28 mEq/L (ref 19–32)
Calcium: 9 mg/dL (ref 8.4–10.5)
Chloride: 103 mEq/L (ref 96–112)
Creatinine, Ser: 1.12 mg/dL (ref 0.40–1.50)
GFR: 63.57 mL/min (ref >60.00)
Glucose, Bld: 144 mg/dL — ABNORMAL HIGH (ref 70–99)
Potassium: 3.9 mEq/L (ref 3.5–5.1)
Sodium: 140 mEq/L (ref 135–145)

## 2019-11-05 LAB — CBC WITH DIFFERENTIAL/PLATELET
Basophils Absolute: 0.1 10*3/uL (ref 0.0–0.1)
Basophils Relative: 1.1 % (ref 0.0–3.0)
Eosinophils Absolute: 0.2 10*3/uL (ref 0.0–0.7)
Eosinophils Relative: 3 % (ref 0.0–5.0)
HCT: 39.2 % (ref 39.0–52.0)
Hemoglobin: 13.7 g/dL (ref 13.0–17.0)
Lymphocytes Relative: 20.4 % (ref 12.0–46.0)
Lymphs Abs: 1.2 10*3/uL (ref 0.7–4.0)
MCHC: 35 g/dL (ref 30.0–36.0)
MCV: 96 fl (ref 78.0–100.0)
Monocytes Absolute: 0.5 10*3/uL (ref 0.1–1.0)
Monocytes Relative: 8.6 % (ref 3.0–12.0)
Neutro Abs: 3.8 10*3/uL (ref 1.4–7.7)
Neutrophils Relative %: 66.9 % (ref 43.0–77.0)
Platelets: 181 10*3/uL (ref 150.0–400.0)
RBC: 4.09 Mil/uL — ABNORMAL LOW (ref 4.22–5.81)
RDW: 12.1 % (ref 11.5–15.5)
WBC: 5.7 10*3/uL (ref 4.0–10.5)

## 2019-11-05 LAB — APTT: aPTT: 27 s (ref 23–32)

## 2019-11-05 LAB — HEPATIC FUNCTION PANEL
ALT: 38 U/L (ref 0–53)
AST: 43 U/L — ABNORMAL HIGH (ref 0–37)
Albumin: 4.3 g/dL (ref 3.5–5.2)
Alkaline Phosphatase: 107 U/L (ref 39–117)
Bilirubin, Direct: 0.2 mg/dL (ref 0.0–0.3)
Total Bilirubin: 0.7 mg/dL (ref 0.2–1.2)
Total Protein: 6.6 g/dL (ref 6.0–8.3)

## 2019-11-05 LAB — VITAMIN D 25 HYDROXY (VIT D DEFICIENCY, FRACTURES): VITD: 76.96 ng/mL (ref 30.00–100.00)

## 2019-11-05 LAB — TSH: TSH: 1.38 u[IU]/mL (ref 0.35–4.50)

## 2019-11-05 LAB — PROTIME-INR
INR: 1
Prothrombin Time: 10.3 s (ref 9.0–11.5)

## 2019-11-05 NOTE — Assessment & Plan Note (Signed)
His level is lower with suspension of supplementation 

## 2019-11-05 NOTE — Assessment & Plan Note (Addendum)
I have ordered and reviewed a 12 lead EKG and find that there are no acute changes and patient is in sinus rhythm.    Preliminary chest x ray is normal (ready by me) .   Platelets,  Coags,  Liver and kidney function,  Cbc and thyroid function are all normal.  There are no contraindications to planned surgery tomorrow.  Lab Results  Component Value Date   WBC 5.7 11/04/2019   HGB 13.7 11/04/2019   HCT 39.2 11/04/2019   MCV 96.0 11/04/2019   PLT 181.0 11/04/2019   Lab Results  Component Value Date   CREATININE 1.12 11/04/2019   Lab Results  Component Value Date   INR 1.0 11/04/2019   INR 1.08 06/14/2014   No results found for: PTT

## 2019-11-06 DIAGNOSIS — S86811A Strain of other muscle(s) and tendon(s) at lower leg level, right leg, initial encounter: Secondary | ICD-10-CM | POA: Diagnosis not present

## 2019-11-06 DIAGNOSIS — S83261A Peripheral tear of lateral meniscus, current injury, right knee, initial encounter: Secondary | ICD-10-CM | POA: Diagnosis not present

## 2019-11-06 DIAGNOSIS — M23231 Derangement of other medial meniscus due to old tear or injury, right knee: Secondary | ICD-10-CM | POA: Diagnosis not present

## 2019-11-06 DIAGNOSIS — S76111A Strain of right quadriceps muscle, fascia and tendon, initial encounter: Secondary | ICD-10-CM | POA: Diagnosis not present

## 2019-11-06 DIAGNOSIS — M94261 Chondromalacia, right knee: Secondary | ICD-10-CM | POA: Diagnosis not present

## 2019-11-06 DIAGNOSIS — S83241A Other tear of medial meniscus, current injury, right knee, initial encounter: Secondary | ICD-10-CM | POA: Diagnosis not present

## 2019-11-06 DIAGNOSIS — S83281A Other tear of lateral meniscus, current injury, right knee, initial encounter: Secondary | ICD-10-CM | POA: Diagnosis not present

## 2019-11-06 DIAGNOSIS — G8918 Other acute postprocedural pain: Secondary | ICD-10-CM | POA: Diagnosis not present

## 2019-11-06 DIAGNOSIS — M66251 Spontaneous rupture of extensor tendons, right thigh: Secondary | ICD-10-CM | POA: Diagnosis not present

## 2019-11-06 DIAGNOSIS — W19XXXA Unspecified fall, initial encounter: Secondary | ICD-10-CM | POA: Diagnosis not present

## 2019-11-06 DIAGNOSIS — S83411A Sprain of medial collateral ligament of right knee, initial encounter: Secondary | ICD-10-CM | POA: Diagnosis not present

## 2019-11-06 DIAGNOSIS — M23261 Derangement of other lateral meniscus due to old tear or injury, right knee: Secondary | ICD-10-CM | POA: Diagnosis not present

## 2019-11-06 DIAGNOSIS — M23611 Other spontaneous disruption of anterior cruciate ligament of right knee: Secondary | ICD-10-CM | POA: Diagnosis not present

## 2019-11-06 DIAGNOSIS — M2351 Chronic instability of knee, right knee: Secondary | ICD-10-CM | POA: Diagnosis not present

## 2019-11-06 DIAGNOSIS — M25361 Other instability, right knee: Secondary | ICD-10-CM | POA: Diagnosis not present

## 2019-11-06 DIAGNOSIS — S83221A Peripheral tear of medial meniscus, current injury, right knee, initial encounter: Secondary | ICD-10-CM | POA: Diagnosis not present

## 2019-11-06 DIAGNOSIS — X58XXXA Exposure to other specified factors, initial encounter: Secondary | ICD-10-CM | POA: Diagnosis not present

## 2019-11-12 DIAGNOSIS — S83241A Other tear of medial meniscus, current injury, right knee, initial encounter: Secondary | ICD-10-CM | POA: Diagnosis not present

## 2019-11-12 DIAGNOSIS — M2351 Chronic instability of knee, right knee: Secondary | ICD-10-CM | POA: Diagnosis not present

## 2019-11-16 ENCOUNTER — Other Ambulatory Visit: Payer: Self-pay | Admitting: Internal Medicine

## 2019-12-01 DIAGNOSIS — M2351 Chronic instability of knee, right knee: Secondary | ICD-10-CM | POA: Diagnosis not present

## 2019-12-07 DIAGNOSIS — M6281 Muscle weakness (generalized): Secondary | ICD-10-CM | POA: Diagnosis not present

## 2019-12-07 DIAGNOSIS — M25561 Pain in right knee: Secondary | ICD-10-CM | POA: Diagnosis not present

## 2019-12-07 DIAGNOSIS — R262 Difficulty in walking, not elsewhere classified: Secondary | ICD-10-CM | POA: Diagnosis not present

## 2019-12-07 DIAGNOSIS — M25661 Stiffness of right knee, not elsewhere classified: Secondary | ICD-10-CM | POA: Diagnosis not present

## 2019-12-15 ENCOUNTER — Ambulatory Visit: Payer: PPO

## 2019-12-16 ENCOUNTER — Other Ambulatory Visit: Payer: Self-pay

## 2019-12-16 ENCOUNTER — Ambulatory Visit (INDEPENDENT_AMBULATORY_CARE_PROVIDER_SITE_OTHER): Payer: PPO

## 2019-12-16 DIAGNOSIS — IMO0002 Reserved for concepts with insufficient information to code with codable children: Secondary | ICD-10-CM

## 2019-12-16 DIAGNOSIS — M810 Age-related osteoporosis without current pathological fracture: Secondary | ICD-10-CM

## 2019-12-16 MED ORDER — DENOSUMAB 60 MG/ML ~~LOC~~ SOSY
60.0000 mg | PREFILLED_SYRINGE | Freq: Once | SUBCUTANEOUS | Status: AC
  Administered 2019-12-16: 60 mg via SUBCUTANEOUS

## 2019-12-16 NOTE — Progress Notes (Signed)
Patient presented for 6-month Prolia injection SQ to left arm. Patient tolerated well. 

## 2019-12-17 DIAGNOSIS — R262 Difficulty in walking, not elsewhere classified: Secondary | ICD-10-CM | POA: Diagnosis not present

## 2019-12-17 DIAGNOSIS — M25561 Pain in right knee: Secondary | ICD-10-CM | POA: Diagnosis not present

## 2019-12-17 DIAGNOSIS — M6281 Muscle weakness (generalized): Secondary | ICD-10-CM | POA: Diagnosis not present

## 2019-12-17 DIAGNOSIS — M25661 Stiffness of right knee, not elsewhere classified: Secondary | ICD-10-CM | POA: Diagnosis not present

## 2019-12-21 DIAGNOSIS — R262 Difficulty in walking, not elsewhere classified: Secondary | ICD-10-CM | POA: Diagnosis not present

## 2019-12-21 DIAGNOSIS — M25561 Pain in right knee: Secondary | ICD-10-CM | POA: Diagnosis not present

## 2019-12-21 DIAGNOSIS — M6281 Muscle weakness (generalized): Secondary | ICD-10-CM | POA: Diagnosis not present

## 2019-12-21 DIAGNOSIS — M25661 Stiffness of right knee, not elsewhere classified: Secondary | ICD-10-CM | POA: Diagnosis not present

## 2019-12-24 DIAGNOSIS — M25512 Pain in left shoulder: Secondary | ICD-10-CM | POA: Diagnosis not present

## 2019-12-29 DIAGNOSIS — M25561 Pain in right knee: Secondary | ICD-10-CM | POA: Diagnosis not present

## 2019-12-31 DIAGNOSIS — M6281 Muscle weakness (generalized): Secondary | ICD-10-CM | POA: Diagnosis not present

## 2019-12-31 DIAGNOSIS — M25661 Stiffness of right knee, not elsewhere classified: Secondary | ICD-10-CM | POA: Diagnosis not present

## 2019-12-31 DIAGNOSIS — M25561 Pain in right knee: Secondary | ICD-10-CM | POA: Diagnosis not present

## 2019-12-31 DIAGNOSIS — R262 Difficulty in walking, not elsewhere classified: Secondary | ICD-10-CM | POA: Diagnosis not present

## 2020-01-06 DIAGNOSIS — R262 Difficulty in walking, not elsewhere classified: Secondary | ICD-10-CM | POA: Diagnosis not present

## 2020-01-06 DIAGNOSIS — M25661 Stiffness of right knee, not elsewhere classified: Secondary | ICD-10-CM | POA: Diagnosis not present

## 2020-01-06 DIAGNOSIS — M25561 Pain in right knee: Secondary | ICD-10-CM | POA: Diagnosis not present

## 2020-01-06 DIAGNOSIS — M6281 Muscle weakness (generalized): Secondary | ICD-10-CM | POA: Diagnosis not present

## 2020-01-12 DIAGNOSIS — M25561 Pain in right knee: Secondary | ICD-10-CM | POA: Diagnosis not present

## 2020-01-12 DIAGNOSIS — M6281 Muscle weakness (generalized): Secondary | ICD-10-CM | POA: Diagnosis not present

## 2020-01-12 DIAGNOSIS — M25661 Stiffness of right knee, not elsewhere classified: Secondary | ICD-10-CM | POA: Diagnosis not present

## 2020-01-12 DIAGNOSIS — R262 Difficulty in walking, not elsewhere classified: Secondary | ICD-10-CM | POA: Diagnosis not present

## 2020-01-18 ENCOUNTER — Other Ambulatory Visit: Payer: Self-pay | Admitting: Internal Medicine

## 2020-01-18 NOTE — Telephone Encounter (Signed)
Refill request for Tony Luna, last seen 11-04-19, last filled 09-02-19.  Please advise.

## 2020-01-19 DIAGNOSIS — M25561 Pain in right knee: Secondary | ICD-10-CM | POA: Diagnosis not present

## 2020-01-19 DIAGNOSIS — M25661 Stiffness of right knee, not elsewhere classified: Secondary | ICD-10-CM | POA: Diagnosis not present

## 2020-01-19 DIAGNOSIS — M6281 Muscle weakness (generalized): Secondary | ICD-10-CM | POA: Diagnosis not present

## 2020-01-19 DIAGNOSIS — R262 Difficulty in walking, not elsewhere classified: Secondary | ICD-10-CM | POA: Diagnosis not present

## 2020-01-19 DIAGNOSIS — H2513 Age-related nuclear cataract, bilateral: Secondary | ICD-10-CM | POA: Diagnosis not present

## 2020-01-22 ENCOUNTER — Other Ambulatory Visit: Payer: Self-pay | Admitting: Internal Medicine

## 2020-01-26 DIAGNOSIS — E291 Testicular hypofunction: Secondary | ICD-10-CM | POA: Diagnosis not present

## 2020-01-26 DIAGNOSIS — R5383 Other fatigue: Secondary | ICD-10-CM | POA: Diagnosis not present

## 2020-01-26 DIAGNOSIS — R6882 Decreased libido: Secondary | ICD-10-CM | POA: Diagnosis not present

## 2020-01-28 DIAGNOSIS — N529 Male erectile dysfunction, unspecified: Secondary | ICD-10-CM | POA: Diagnosis not present

## 2020-01-28 DIAGNOSIS — D696 Thrombocytopenia, unspecified: Secondary | ICD-10-CM | POA: Diagnosis not present

## 2020-01-28 DIAGNOSIS — E291 Testicular hypofunction: Secondary | ICD-10-CM | POA: Diagnosis not present

## 2020-01-28 DIAGNOSIS — G479 Sleep disorder, unspecified: Secondary | ICD-10-CM | POA: Diagnosis not present

## 2020-01-28 DIAGNOSIS — R6882 Decreased libido: Secondary | ICD-10-CM | POA: Diagnosis not present

## 2020-02-04 DIAGNOSIS — E291 Testicular hypofunction: Secondary | ICD-10-CM | POA: Diagnosis not present

## 2020-02-04 DIAGNOSIS — R6882 Decreased libido: Secondary | ICD-10-CM | POA: Diagnosis not present

## 2020-02-04 DIAGNOSIS — G479 Sleep disorder, unspecified: Secondary | ICD-10-CM | POA: Diagnosis not present

## 2020-02-23 DIAGNOSIS — M25512 Pain in left shoulder: Secondary | ICD-10-CM | POA: Diagnosis not present

## 2020-02-25 DIAGNOSIS — D045 Carcinoma in situ of skin of trunk: Secondary | ICD-10-CM | POA: Diagnosis not present

## 2020-02-25 DIAGNOSIS — L821 Other seborrheic keratosis: Secondary | ICD-10-CM | POA: Diagnosis not present

## 2020-02-25 DIAGNOSIS — D2262 Melanocytic nevi of left upper limb, including shoulder: Secondary | ICD-10-CM | POA: Diagnosis not present

## 2020-02-25 DIAGNOSIS — X32XXXA Exposure to sunlight, initial encounter: Secondary | ICD-10-CM | POA: Diagnosis not present

## 2020-02-25 DIAGNOSIS — D225 Melanocytic nevi of trunk: Secondary | ICD-10-CM | POA: Diagnosis not present

## 2020-02-25 DIAGNOSIS — D485 Neoplasm of uncertain behavior of skin: Secondary | ICD-10-CM | POA: Diagnosis not present

## 2020-02-25 DIAGNOSIS — Z85828 Personal history of other malignant neoplasm of skin: Secondary | ICD-10-CM | POA: Diagnosis not present

## 2020-02-25 DIAGNOSIS — D2272 Melanocytic nevi of left lower limb, including hip: Secondary | ICD-10-CM | POA: Diagnosis not present

## 2020-02-25 DIAGNOSIS — L570 Actinic keratosis: Secondary | ICD-10-CM | POA: Diagnosis not present

## 2020-02-25 DIAGNOSIS — D2261 Melanocytic nevi of right upper limb, including shoulder: Secondary | ICD-10-CM | POA: Diagnosis not present

## 2020-02-26 ENCOUNTER — Other Ambulatory Visit: Payer: Self-pay | Admitting: Internal Medicine

## 2020-03-01 ENCOUNTER — Other Ambulatory Visit: Payer: Self-pay | Admitting: Internal Medicine

## 2020-03-03 ENCOUNTER — Telehealth: Payer: Self-pay | Admitting: Internal Medicine

## 2020-03-03 DIAGNOSIS — Z0279 Encounter for issue of other medical certificate: Secondary | ICD-10-CM

## 2020-03-03 NOTE — Telephone Encounter (Signed)
Placed in red folder  

## 2020-03-03 NOTE — Telephone Encounter (Signed)
Patient dropped off a form from his medical insurance. Form needs to be completed by Dr. Derrel Nip. Patient has attached a self addressed envelope, please mail to him. Form is up front in color folder.

## 2020-03-09 DIAGNOSIS — H60332 Swimmer's ear, left ear: Secondary | ICD-10-CM | POA: Diagnosis not present

## 2020-03-09 DIAGNOSIS — H6123 Impacted cerumen, bilateral: Secondary | ICD-10-CM | POA: Diagnosis not present

## 2020-03-18 DIAGNOSIS — D045 Carcinoma in situ of skin of trunk: Secondary | ICD-10-CM | POA: Diagnosis not present

## 2020-03-21 DIAGNOSIS — H60332 Swimmer's ear, left ear: Secondary | ICD-10-CM | POA: Diagnosis not present

## 2020-03-21 DIAGNOSIS — H903 Sensorineural hearing loss, bilateral: Secondary | ICD-10-CM | POA: Diagnosis not present

## 2020-03-23 ENCOUNTER — Telehealth: Payer: Self-pay

## 2020-03-23 ENCOUNTER — Telehealth: Payer: Self-pay | Admitting: Internal Medicine

## 2020-03-23 DIAGNOSIS — D696 Thrombocytopenia, unspecified: Secondary | ICD-10-CM

## 2020-03-23 DIAGNOSIS — M810 Age-related osteoporosis without current pathological fracture: Secondary | ICD-10-CM

## 2020-03-23 DIAGNOSIS — Z125 Encounter for screening for malignant neoplasm of prostate: Secondary | ICD-10-CM

## 2020-03-23 DIAGNOSIS — K7460 Unspecified cirrhosis of liver: Secondary | ICD-10-CM

## 2020-03-23 DIAGNOSIS — E673 Hypervitaminosis D: Secondary | ICD-10-CM

## 2020-03-23 NOTE — Telephone Encounter (Signed)
Labs patient has already had done this year are in the red folder not the yellow folder the other message says.

## 2020-03-23 NOTE — Telephone Encounter (Signed)
Patient called in need order for labs his appointment is 03-31-20 at [facility]

## 2020-03-23 NOTE — Telephone Encounter (Signed)
Placed in yellow folder.  

## 2020-03-23 NOTE — Telephone Encounter (Signed)
Pt dropped off lab reports from earlier this year and a note to Dr. Derrel Nip. Placed in colored folder in front office for Dr. Derrel Nip.

## 2020-03-24 ENCOUNTER — Other Ambulatory Visit: Payer: Self-pay | Admitting: Internal Medicine

## 2020-03-24 ENCOUNTER — Telehealth: Payer: Self-pay | Admitting: Internal Medicine

## 2020-03-24 DIAGNOSIS — K7460 Unspecified cirrhosis of liver: Secondary | ICD-10-CM

## 2020-03-24 NOTE — Telephone Encounter (Signed)
Fasting labs ordered MyChart message sent

## 2020-03-24 NOTE — Addendum Note (Signed)
Addended by: [name] on: 03/24/2020 02:26 PM   Modules accepted: Orders

## 2020-03-29 ENCOUNTER — Other Ambulatory Visit (INDEPENDENT_AMBULATORY_CARE_PROVIDER_SITE_OTHER): Payer: PPO

## 2020-03-29 ENCOUNTER — Other Ambulatory Visit: Payer: Self-pay

## 2020-03-29 DIAGNOSIS — Z125 Encounter for screening for malignant neoplasm of prostate: Secondary | ICD-10-CM

## 2020-03-29 DIAGNOSIS — E673 Hypervitaminosis D: Secondary | ICD-10-CM

## 2020-03-29 DIAGNOSIS — K7460 Unspecified cirrhosis of liver: Secondary | ICD-10-CM | POA: Diagnosis not present

## 2020-03-29 DIAGNOSIS — D696 Thrombocytopenia, unspecified: Secondary | ICD-10-CM

## 2020-03-29 DIAGNOSIS — M810 Age-related osteoporosis without current pathological fracture: Secondary | ICD-10-CM | POA: Diagnosis not present

## 2020-03-29 LAB — LIPID PANEL
Cholesterol: 202 mg/dL — ABNORMAL HIGH (ref 0–200)
HDL: 93.8 mg/dL (ref >39.00)
LDL Cholesterol: 93 mg/dL (ref 0–99)
NonHDL: 108.3
Total CHOL/HDL Ratio: 2
Triglycerides: 77 mg/dL (ref 0.0–149.0)
VLDL: 15.4 mg/dL (ref 0.0–40.0)

## 2020-03-29 LAB — CBC WITH DIFFERENTIAL/PLATELET
Basophils Absolute: 0 10*3/uL (ref 0.0–0.1)
Basophils Relative: 0.9 % (ref 0.0–3.0)
Eosinophils Absolute: 0.1 10*3/uL (ref 0.0–0.7)
Eosinophils Relative: 2.3 % (ref 0.0–5.0)
HCT: 43.5 % (ref 39.0–52.0)
Hemoglobin: 14.8 g/dL (ref 13.0–17.0)
Lymphocytes Relative: 19.3 % (ref 12.0–46.0)
Lymphs Abs: 0.9 10*3/uL (ref 0.7–4.0)
MCHC: 34 g/dL (ref 30.0–36.0)
MCV: 98.6 fl (ref 78.0–100.0)
Monocytes Absolute: 0.4 10*3/uL (ref 0.1–1.0)
Monocytes Relative: 7.4 % (ref 3.0–12.0)
Neutro Abs: 3.4 10*3/uL (ref 1.4–7.7)
Neutrophils Relative %: 70.1 % (ref 43.0–77.0)
Platelets: 149 10*3/uL — ABNORMAL LOW (ref 150.0–400.0)
RBC: 4.41 Mil/uL (ref 4.22–5.81)
RDW: 13.2 % (ref 11.5–15.5)
WBC: 4.9 10*3/uL (ref 4.0–10.5)

## 2020-03-29 LAB — COMPREHENSIVE METABOLIC PANEL
ALT: 43 U/L (ref 0–53)
AST: 38 U/L — ABNORMAL HIGH (ref 0–37)
Albumin: 4.6 g/dL (ref 3.5–5.2)
Alkaline Phosphatase: 96 U/L (ref 39–117)
BUN: 15 mg/dL (ref 6–23)
CO2: 27 mEq/L (ref 19–32)
Calcium: 9.2 mg/dL (ref 8.4–10.5)
Chloride: 103 mEq/L (ref 96–112)
Creatinine, Ser: 0.94 mg/dL (ref 0.40–1.50)
GFR: 78.23 mL/min (ref >60.00)
Glucose, Bld: 100 mg/dL — ABNORMAL HIGH (ref 70–99)
Potassium: 4 mEq/L (ref 3.5–5.1)
Sodium: 140 mEq/L (ref 135–145)
Total Bilirubin: 1 mg/dL (ref 0.2–1.2)
Total Protein: 7 g/dL (ref 6.0–8.3)

## 2020-03-29 LAB — VITAMIN D 25 HYDROXY (VIT D DEFICIENCY, FRACTURES): VITD: 53.51 ng/mL (ref 30.00–100.00)

## 2020-03-29 LAB — PSA, MEDICARE: PSA: 0.75 ng/ml (ref 0.10–4.00)

## 2020-03-29 LAB — GAMMA GT: GGT: 99 U/L — ABNORMAL HIGH (ref 7–51)

## 2020-03-29 LAB — TSH: TSH: 2.12 u[IU]/mL (ref 0.35–4.50)

## 2020-03-31 ENCOUNTER — Ambulatory Visit (INDEPENDENT_AMBULATORY_CARE_PROVIDER_SITE_OTHER): Payer: PPO | Admitting: Internal Medicine

## 2020-03-31 ENCOUNTER — Other Ambulatory Visit: Payer: Self-pay

## 2020-03-31 ENCOUNTER — Encounter: Payer: Self-pay | Admitting: Internal Medicine

## 2020-03-31 ENCOUNTER — Telehealth: Payer: Self-pay | Admitting: Internal Medicine

## 2020-03-31 VITALS — BP 123/64 | HR 72 | Temp 98.3°F | Resp 16 | Ht 72.0 in | Wt 169.2 lb

## 2020-03-31 DIAGNOSIS — R030 Elevated blood-pressure reading, without diagnosis of hypertension: Secondary | ICD-10-CM | POA: Diagnosis not present

## 2020-03-31 DIAGNOSIS — Z125 Encounter for screening for malignant neoplasm of prostate: Secondary | ICD-10-CM

## 2020-03-31 DIAGNOSIS — E673 Hypervitaminosis D: Secondary | ICD-10-CM

## 2020-03-31 DIAGNOSIS — Z0001 Encounter for general adult medical examination with abnormal findings: Secondary | ICD-10-CM

## 2020-03-31 DIAGNOSIS — R011 Cardiac murmur, unspecified: Secondary | ICD-10-CM

## 2020-03-31 DIAGNOSIS — K7460 Unspecified cirrhosis of liver: Secondary | ICD-10-CM | POA: Diagnosis not present

## 2020-03-31 DIAGNOSIS — Z23 Encounter for immunization: Secondary | ICD-10-CM | POA: Diagnosis not present

## 2020-03-31 MED ORDER — ZOSTER VAC RECOMB ADJUVANTED 50 MCG/0.5ML IM SUSR: 0.5000 mL | Freq: Once | INTRAMUSCULAR | 1 refills | Status: AC

## 2020-03-31 NOTE — Assessment & Plan Note (Addendum)
HOME READINGS  ARE USUALLY 120/70 or lower  Using an OMRON MONITOR.

## 2020-03-31 NOTE — Telephone Encounter (Signed)
lft vm for pt to call ofc to sch US 

## 2020-03-31 NOTE — Progress Notes (Signed)
Patient ID: Tony Luna, male    DOB: Dec 23, 1940  Age: 79 y.o. MRN: [id number]  The patient is here for annual PREVENTIVE examination and management of other chronic and acute problems.   The risk factors are reflected in the social history.  The roster of all physicians providing medical care to patient - is listed in the Snapshot section of the chart.  Activities of daily living:  The patient is 100% independent in all ADLs: dressing, toileting, feeding as well as independent mobility  Home safety : The patient has smoke detectors in the home. They wear seatbelts.  There are no firearms at home. There is no violence in the home.   There is no risks for hepatitis, STDs or HIV. There is no   history of blood transfusion. They have no travel history to infectious disease endemic areas of the world.  The patient has seen their dentist in the last six month. They have seen their eye doctor in the last year. They admit to slight hearing difficulty with regard to whispered voices and some television programs.  They have deferred audiologic testing in the last year.  They do not  have excessive sun exposure. Discussed the need for sun protection: hats, long sleeves and use of sunscreen if there is significant sun exposure.   Diet: the importance of a healthy diet is discussed. They do have a healthy diet.  The benefits of regular aerobic exercise were discussed. She walks 4 times per week ,  20 minutes.   Depression screen: there are no signs or vegative symptoms of depression- irritability, change in appetite, anhedonia, sadness/tearfullness.  Cognitive assessment: the patient manages all their financial and personal affairs and is actively engaged. They could relate day,date,year and events; recalled 2/3 objects at 3 minutes; performed clock-face test normally.  The following portions of the patient's history were reviewed and updated as appropriate: allergies, current medications, past family  history, past medical history,  past surgical history, past social history  and problem list.  Visual acuity was not assessed per patient preference since she has regular follow up with her ophthalmologist. Hearing and body mass index were assessed and reviewed.   During the course of the visit the patient was educated and counseled about appropriate screening and preventive services including : fall prevention , diabetes screening, nutrition counseling, colorectal cancer screening, and recommended immunizations.    CC: The primary encounter diagnosis was Prostate cancer screening. 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  1) RECOVERED FROM right knee ACL/meniscal  repair by Para March  in June.   Hunting quail in [place] in September.  Knee feels fine. S/p  Left shoulder surgery but still problematic,  Bothers his sleep  Had MRI in July   2) wakes up 5-6 times per night despite ambien .  3) NONALCOHOLIC CIRRHOSIS,  NEEDS annual ULTRASOUND   4) Seeing Stoioff annually  PSA normal  5) receiving weekly massage from the same woman for 23 years.  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   He has a past surgical history that includes Knee surgery;  Rotator cuff repair; Tonsillectomy; Spine surgery; Hernia repair (Left); Cautery of turbinates; blepharaplasty; Total knee arthroplasty (Left, 06/22/2014); and Colonoscopy with propofol (N/A, 07/19/2015).   His family history includes Alcohol abuse in his mother;  Diabetes in his mother; Mental retardation in his mother.He reports that he quit smoking about 36 years ago. His smoking use included cigarettes. He has a 15.00 pack-year smoking history. His smokeless tobacco use includes chew. He reports current alcohol use. He reports that he does not use drugs.  Outpatient Medications Prior to Visit  Medication Sig Dispense Refill  . Cholecalciferol (VITAMIN D-3 PO) Take 5,000 Units by mouth daily. 5,00000 u in the morning, 2,000 u in the evening    . MILK THISTLE PO Take 1 tablet by mouth 2 (two) times daily.    . Multiple Vitamin (MULTIVITAMIN WITH MINERALS) TABS tablet Take 1 tablet by mouth 2 (two) times daily.    . NP THYROID 60 MG tablet TAKE ONE TABLET BY MOUTH EVERY DAY 90 tablet 0  . OVER THE COUNTER MEDICATION Take 4 sprays by mouth at bedtime. Secretropin oral spray - growth hormone    . OVER THE COUNTER MEDICATION Take 1 capsule by mouth 3 (three) times daily. Instaflex    . OVER THE COUNTER MEDICATION Take 1,000 mg by mouth daily. Turmeric with Meriva    . OVER THE COUNTER MEDICATION Take 3,000 mg by mouth daily. Omega Cure    . OVER THE COUNTER MEDICATION Take 250 mg by mouth daily. Bio Magnesium Citrate    . OVER THE COUNTER MEDICATION Take 20,000 mcg by mouth daily. Vitamin B12-methylfolate    . OVER THE COUNTER MEDICATION Take 1 tablet by mouth daily. Pure adrenal 400    . OVER THE COUNTER MEDICATION Take 800 mcg by mouth daily. Active B Folate    . OVER THE COUNTER MEDICATION 2 tablets daily. VitaPrime    . OVER THE COUNTER MEDICATION 1,000 mg daily. Super Bio-C buffered    . OVER THE COUNTER MEDICATION 1,000 mg daily. L'Arginine    . OVER THE COUNTER MEDICATION 2 capsules 2 (two) times daily. SLF Forte    . OVER THE COUNTER MEDICATION 1,000 mg. Myomin    . Saw Palmetto 160 MG TABS     . tadalafil (CIALIS) 20 MG tablet Take 1 tablet (20 mg total) by mouth daily as needed for erectile dysfunction. 240 tablet 0  . tamsulosin (FLOMAX) 0.4 MG  CAPS capsule TAKE 1 CAPSULE EVERY DAY 90 capsule 0  . TAURINE PO Take 1,000 mg by mouth daily.     . Testosterone (TESTOPEL) 75 MG PLLT by Implant route.    Marland Kitchen zolpidem (AMBIEN) 5 MG tablet TAKE 1/2-1 TABLET BY MOUTH AT BEDTIME ASNEEDED FOR SLEEP 48 tablet 5  . meloxicam (MOBIC) 15 MG tablet Take 1 tablet (15 mg total) by mouth daily as needed for pain. 90 tablet 1  . OVER THE COUNTER MEDICATION Inject 50 Units as directed daily. Semorelin (Patient not taking: Reported on 03/31/2020)    . sildenafil (VIAGRA) 100 MG tablet Take 1 tablet (100 mg total) by mouth as needed for erectile dysfunction. (Patient not taking: Reported on 03/31/2020) 10 tablet 5  . sildenafil (VIAGRA) 50 MG tablet USE AS DIRECTED (Patient not taking: Reported on 03/31/2020) 90 tablet 0   No facility-administered medications prior to visit.    Review of Systems   Patient denies headache, fevers, malaise, unintentional weight loss, skin rash, eye pain, sinus congestion and sinus pain, sore throat,  dysphagia,  hemoptysis , cough, dyspnea, wheezing, chest pain, palpitations, orthopnea, edema, abdominal pain, nausea, melena, diarrhea, constipation, flank pain, dysuria, hematuria, urinary  Frequency, nocturia, numbness, tingling, seizures,  Focal weakness, Loss of consciousness,  Tremor, insomnia, depression, anxiety, and suicidal ideation.      Objective:  BP 123/64 (BP Location: Left Arm, Patient Position: Sitting, Cuff Size: Normal)   Pulse 72   Temp 98.3 F (36.8 C) (Oral)   Resp 16   Ht 6' (1.829 m)   Wt 169 lb 3.2 oz (76.7 kg)   SpO2 99%   BMI 22.95 kg/m   Physical Exam  General appearance: alert, cooperative and appears stated age Ears: normal TM's and external ear canals both ears Throat: lips, mucosa, and tongue normal; teeth and gums normal Neck: no adenopathy, no carotid bruit, supple, symmetrical, trachea midline and thyroid not enlarged, symmetric, no tenderness/mass/nodules Back: symmetric, no curvature.  ROM normal. No CVA tenderness. Lungs: clear to auscultation bilaterally Heart: regular rate and rhythm, S1, S2 normal, no murmur, click, rub or gallop Abdomen: soft, non-tender; bowel sounds normal; no masses,  no organomegaly Pulses: 2+ and symmetric Skin: Skin color, texture, turgor normal. No rashes or lesions Lymph nodes: Cervical, supraclavicular, and axillary nodes normal.  Assessment & Plan:   Problem List Items Addressed This Visit      Unprioritized   Cardiac murmur    Non radiating , no prior evaluation .  ECHO and cardiology referral made.        Relevant Orders   Ambulatory referral to Cardiology   ECHOCARDIOGRAM COMPLETE   Cirrhosis ([place])    Confirmed by elastogram.  Reviewed labs from [place] via Epic .   Hepatic synthetic function remains normal and he is asymptomatic.  Annual screening for hepatocellular CA needed with AFP and [place]        Relevant Orders   US Abdomen Limited RUQ (LIVER/GB)   Gamma GT   Comprehensive metabolic panel   Encounter for preventative adult health care exam with abnormal findings    age appropriate education and counseling updated, referrals for preventative services and immunizations addressed, dietary and smoking counseling addressed, most recent labs reviewed.  I have personally reviewed and have noted:  1) the patient's medical and social history 2) The pt's use of alcohol, tobacco, and illicit drugs 3) The patient's current medications and supplements 4) Functional ability including ADL's, fall risk, home safety risk, hearing and visual impairment 5) Diet and physical activities 6) Evidence for depression or mood disorder 7) The patient's height, weight, and BMI have been recorded in the chart  I have made referrals, and provided counseling and education based on review of the above      Hypervitaminosis D    His level is lower with suspension of supplementation      Prostate cancer screening - Primary    PSA has been   Normal., and he prefers to continue screening at his own expense  Lab Results  Component Value Date   PSA 0.75 03/29/2020   PSA 1.1 04/30/2019   PSA 1.24 04/27/2019         White coat 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         Other Visit Diagnoses    Need for immunization against influenza       Relevant Orders   Flu Vaccine QUAD High Dose(Fluad) (Completed)  I have discontinued Imagene Riches "Tom"'s sildenafil, meloxicam, and sildenafil. I am also having him start on Zoster Vaccine Adjuvanted. Additionally, I am having him maintain his [place], OVER THE COUNTER MEDICATION, multivitamin with minerals, OVER THE COUNTER MEDICATION, Cholecalciferol (VITAMIN D-3 PO), TAURINE PO, OVER THE COUNTER MEDICATION, OVER THE COUNTER MEDICATION, MILK THISTLE PO, OVER THE COUNTER MEDICATION, OVER THE COUNTER MEDICATION, OVER THE COUNTER MEDICATION, OVER THE COUNTER MEDICATION, OVER THE COUNTER MEDICATION, OVER THE COUNTER MEDICATION, OVER THE COUNTER MEDICATION, Testosterone, tadalafil, zolpidem, tamsulosin, NP Thyroid, and Saw Palmetto.  Meds ordered this encounter  Medications  . Zoster Vaccine Adjuvanted [facility]) injection    Sig: Inject 0.5 mLs into the muscle once for 1 dose.    Dispense:  1 each    Refill:  1    Medications Discontinued During This Encounter  Medication Reason  . meloxicam (MOBIC) 15 MG tablet   . OVER THE COUNTER MEDICATION   . sildenafil (VIAGRA) 50 MG tablet   . sildenafil (VIAGRA) 100 MG tablet     Follow-up: No follow-ups on file.   [name], MD

## 2020-03-31 NOTE — Patient Instructions (Signed)
Return for non fasting labs in 3 moths (liver enzymes)  Referrals for the following are in process:  2D ECHO of heart (murmur)  Cardiologist   (murmur)  Liver ultrasound  (annual screen for liver cancer)    The ShingRx vaccine is now available in local pharmacies and is much more protective thant Zostavaxs,  It is therefore ADVISED for all interested adults over 50 to prevent shingles   It should be separated from your booster and your flu vaccines by 2 weeks.  2nd dose of shingles is due between 2-6 months after the first dose   Health Maintenance After Age 36 After age 62, you are at a higher risk for certain long-term diseases and infections as well as injuries from falls. Falls are a major cause of broken bones and head injuries in people who are older than age 49. Getting regular preventive care can help to keep you healthy and well. Preventive care includes getting regular testing and making lifestyle changes as recommended by your health care provider. Talk with your health care provider about:  Which screenings and tests you should have. A screening is a test that checks for a disease when you have no symptoms.  A diet and exercise plan that is right for you. What should I know about screenings and tests to prevent falls? Screening and testing are the best ways to find a health problem early. Early diagnosis and treatment give you the best chance of managing medical conditions that are common after age 32. Certain conditions and lifestyle choices may make you more likely to have a fall. Your health care provider may recommend:  Regular vision checks. Poor vision and conditions such as cataracts can make you more likely to have a fall. If you wear glasses, make sure to get your prescription updated if your vision changes.  Medicine review. Work with your health care provider to regularly review all of the medicines you are taking, including over-the-counter medicines. Ask your health  care provider about any side effects that may make you more likely to have a fall. Tell your health care provider if any medicines that you take make you feel dizzy or sleepy.  Osteoporosis screening. Osteoporosis is a condition that causes the bones to get weaker. This can make the bones weak and cause them to break more easily.  Blood pressure screening. Blood pressure changes and medicines to control blood pressure can make you feel dizzy.  Strength and balance checks. Your health care provider may recommend certain tests to check your strength and balance while standing, walking, or changing positions.  Foot health exam. Foot pain and numbness, as well as not wearing proper footwear, can make you more likely to have a fall.  Depression screening. You may be more likely to have a fall if you have a fear of falling, feel emotionally low, or feel unable to do activities that you used to do.  Alcohol use screening. Using too much alcohol can affect your balance and may make you more likely to have a fall. What actions can I take to lower my risk of falls? General instructions  Talk with your health care provider about your risks for falling. Tell your health care provider if: ? You fall. Be sure to tell your health care provider about all falls, even ones that seem minor. ? You feel dizzy, sleepy, or off-balance.  Take over-the-counter and prescription medicines only as told by your health care provider. These include any supplements.  Eat a healthy diet and maintain a healthy weight. A healthy diet includes low-fat dairy products, low-fat (lean) meats, and fiber from whole grains, beans, and lots of fruits and vegetables. Home safety  Remove any tripping hazards, such as rugs, cords, and clutter.  Install safety equipment such as grab bars in bathrooms and safety rails on stairs.  Keep rooms and walkways well-lit. Activity   Follow a regular exercise program to stay fit. This will  help you maintain your balance. Ask your health care provider what types of exercise are appropriate for you.  If you need a cane or walker, use it as recommended by your health care provider.  Wear supportive shoes that have nonskid soles. Lifestyle  Do not drink alcohol if your health care provider tells you not to drink.  If you drink alcohol, limit how much you have: ? 0-1 drink a day for women. ? 0-2 drinks a day for men.  Be aware of how much alcohol is in your drink. In the [place] one drink equals one typical bottle of beer (12 oz), one-half glass of wine (5 oz), or one shot of hard liquor (1 oz).  Do not use any products that contain nicotine or tobacco, such as cigarettes and e-cigarettes. If you need help quitting, ask your health care provider. Summary  Having a healthy lifestyle and getting preventive care can help to protect your health and wellness after age 71.  Screening and testing are the best way to find a health problem early and help you avoid having a fall. Early diagnosis and treatment give you the best chance for managing medical conditions that are more common for people who are older than age 5.  Falls are a major cause of broken bones and head injuries in people who are older than age 49. Take precautions to prevent a fall at home.  Work with your health care provider to learn what changes you can make to improve your health and wellness and to prevent falls. This information is not intended to replace advice given to you by your health care provider. Make sure you discuss any questions you have with your health care provider. Document Revised: 09/04/2018 Document Reviewed: 03/27/2017 Elsevier Patient Education  2020 [employer].

## 2020-04-01 ENCOUNTER — Telehealth: Payer: Self-pay | Admitting: Internal Medicine

## 2020-04-01 NOTE — Telephone Encounter (Signed)
lft vm for pt to call ofc to sch US 

## 2020-04-02 DIAGNOSIS — Z0001 Encounter for general adult medical examination with abnormal findings: Secondary | ICD-10-CM | POA: Insufficient documentation

## 2020-04-02 LAB — AFP TUMOR MARKER: AFP-Tumor Marker: 1.7 ng/mL (ref <6.1)

## 2020-04-02 LAB — TESTOS,TOTAL,FREE AND SHBG (FEMALE)
Free Testosterone: 200.3 pg/mL — ABNORMAL HIGH (ref 30.0–135.0)
Sex Hormone Binding: 81 nmol/L — ABNORMAL HIGH (ref 22–77)
Testosterone, Total, LC-MS-MS: 1773 ng/dL — ABNORMAL HIGH (ref 250–1100)

## 2020-04-02 NOTE — Assessment & Plan Note (Signed)
PSA has been  Normal., and he prefers to continue screening at his own expense  Lab Results  Component Value Date   PSA 0.75 03/29/2020   PSA 1.1 04/30/2019   PSA 1.24 04/27/2019

## 2020-04-02 NOTE — Assessment & Plan Note (Signed)
Confirmed by elastogram.  Reviewed labs from [facility] via Epic .   Hepatic synthetic function remains normal and he is asymptomatic.  Annual screening for hepatocellular CA needed with AFP and [place]

## 2020-04-02 NOTE — Assessment & Plan Note (Signed)

## 2020-04-02 NOTE — Assessment & Plan Note (Signed)
Non radiating , no prior evaluation .  ECHO and cardiology referral made.

## 2020-04-02 NOTE — Assessment & Plan Note (Signed)
His level is lower with suspension of supplementation

## 2020-04-03 NOTE — Progress Notes (Signed)
Your testosterone levels are through the roof.  Please follow up with your hormone specialist to address this .  Too much testosterone is not good  for you.    The alfa feto protein (tumor marker) was normal

## 2020-04-04 ENCOUNTER — Other Ambulatory Visit: Payer: Self-pay

## 2020-04-04 ENCOUNTER — Encounter: Payer: Self-pay | Admitting: Cardiology

## 2020-04-04 ENCOUNTER — Ambulatory Visit: Payer: PPO | Admitting: Cardiology

## 2020-04-04 VITALS — BP 160/70 | HR 69 | Ht 72.0 in | Wt 170.1 lb

## 2020-04-04 DIAGNOSIS — R011 Cardiac murmur, unspecified: Secondary | ICD-10-CM

## 2020-04-04 DIAGNOSIS — R030 Elevated blood-pressure reading, without diagnosis of hypertension: Secondary | ICD-10-CM | POA: Diagnosis not present

## 2020-04-04 NOTE — Patient Instructions (Signed)
Medication Instructions:  Your physician recommends that you continue on your current medications as directed. Please refer to the Current Medication list given to you today.  *If you need a refill on your cardiac medications before your next appointment, please call your pharmacy*   Lab Work: None Ordered If you have labs (blood work) drawn today and your tests are completely normal, you will receive your results only by: Marland Kitchen MyChart Message (if you have MyChart) OR . A paper copy in the mail If you have any lab test that is abnormal or we need to change your treatment, we will call you to review the results.   Testing/Procedures:  1.  Your physician has requested that you have an echocardiogram. Echocardiography is a painless test that uses sound waves to create images of your heart. It provides your doctor with information about the size and shape of your heart and how well your heart's chambers and valves are working. This procedure takes approximately one hour. There are no restrictions for this procedure.    Follow-Up: At [facility], you and your health needs are our priority.  As part of our continuing mission to provide you with exceptional heart care, we have created designated Provider Care Teams.  These Care Teams include your primary Cardiologist (physician) and Advanced Practice Providers (APPs -  Physician Assistants and Nurse Practitioners) who all work together to provide you with the care you need, when you need it.  We recommend signing up for the patient portal called "MyChart".  Sign up information is provided on this After Visit Summary.  MyChart is used to connect with patients for Virtual Visits (Telemedicine).  Patients are able to view lab/test results, encounter notes, upcoming appointments, etc.  Non-urgent messages can be sent to your provider as well.   To learn more about what you can do with MyChart, go to [email or web address].    Your next appointment:    Follow up after Echo   The format for your next appointment:   In Person  Provider:   Kate Sable, MD   Other Instructions

## 2020-04-04 NOTE — Progress Notes (Signed)
Cardiology Office Note:    Date:  04/04/2020   ID:  Tony Luna, DOB 09-18-1940, MRN [id number]  PCP:  [name], MD  [place] Cardiologist:  Kate Sable, MD  [facility] Electrophysiologist:  None   Referring MD: [name], MD   Chief Complaint  Patient presents with  . OTHER    Cardiac murmur no complaints today. Meds reviewed verbally with pt.   Tony Luna is a 79 y.o. male who is being seen today for the evaluation of cardiac murmur at the request of Derrel Nip Aris Everts, MD.   History of Present Illness:    Tony Luna is a 79 y.o. male with a hx of hypothyroidism who presents due to a cardiac murmur.  Patient denies any history of heart disease.  He had an echocardiogram about 10 years ago, was told there was "mild abnormalities" with nothing to worry about.  Denies chest pain, shortness of breath, edema.  Saw primary care provider for regular visit where murmur was noted.  Had a recent fall, with right MCL tear.  Underwent surgery and physical therapy, with good results.  Trying to get back to exercising on his elliptical.  Denies any chest pain or shortness of breath with exercising.  Past Medical History:  Diagnosis Date  . Allergy   . Arthritis   . Complication of anesthesia    :"need catheter"  . Dysrhythmia    ? something told by reg dr  unable to say what it was  . Esophageal tear   . Hypothyroidism   . Pneumonia    hx  . 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 Rotator cuff tear     Past Surgical History:  Procedure Laterality Date  . blepharaplasty    . CAUTERY OF TURBINATES    . COLONOSCOPY WITH PROPOFOL N/A 07/19/2015   Procedure: COLONOSCOPY WITH PROPOFOL;  Surgeon: [name], MD;  Location: [facility] ENDOSCOPY;  Service: Endoscopy;  Laterality: N/A;  . HERNIA REPAIR Left    inguinal herniorrhapy  . KNEE SURGERY     ? loose body/ chondral defect 20yrs  . ROTATOR CUFF REPAIR     left  . SPINE SURGERY    .  TONSILLECTOMY    . 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 Boron 6 MG TABS Take by mouth daily.  . Cholecalciferol (VITAMIN D-3 PO) Take 5,000 Units by mouth daily. 5,00000 u in the morning, 2,000 u in the evening  . MILK THISTLE PO Take 1 tablet by mouth 2 (two) times daily.  . Multiple Vitamin (MULTIVITAMIN WITH MINERALS) TABS tablet Take 1 tablet by mouth 2 (two) times daily.  . NON FORMULARY DIM Takes TID.  Marland Kitchen NON FORMULARY Sharp Thought Takes 2 capsule bid.  . NP THYROID 60 MG tablet TAKE ONE TABLET BY MOUTH EVERY DAY  . OVER THE COUNTER MEDICATION Take 4 sprays by mouth at bedtime. Secretropin oral spray - growth hormone  . OVER THE COUNTER MEDICATION Take 1 capsule by mouth 3 (three) times daily. Instaflex  . OVER THE COUNTER MEDICATION Take 1,000 mg by mouth daily. Turmeric with Meriva  . OVER THE COUNTER MEDICATION Take 3,000 mg by mouth daily. Omega Cure  . OVER THE COUNTER MEDICATION Take 250 mg by mouth daily. Bio Magnesium  Citrate  . OVER THE COUNTER MEDICATION Take 20,000 mcg by mouth daily. Vitamin B12-methylfolate  . OVER THE COUNTER MEDICATION Take 1 tablet by mouth daily. Pure adrenal 400  . OVER THE COUNTER MEDICATION Take 800 mcg by mouth daily. Active B Folate  . OVER THE COUNTER MEDICATION 2 tablets daily. VitaPrime  . OVER THE COUNTER MEDICATION 1,000 mg daily. Super Bio-C buffered  . OVER THE COUNTER MEDICATION 1,000 mg daily. L'Arginine  . OVER THE COUNTER MEDICATION 2 capsules 2 (two) times daily. SLF Forte  . OVER THE COUNTER MEDICATION 1,000 mg. Myomin  . Saw Palmetto 160 MG TABS   . tadalafil (CIALIS) 20 MG tablet Take 1 tablet (20 mg total) by mouth daily as needed for erectile dysfunction.  . tamsulosin (FLOMAX) 0.4 MG CAPS capsule TAKE 1 CAPSULE EVERY DAY  . TAURINE PO Take  1,000 mg by mouth daily.   . Testosterone (TESTOPEL) 75 MG PLLT by Implant route.  Marland Kitchen zolpidem (AMBIEN) 5 MG tablet TAKE 1/2-1 TABLET BY MOUTH AT BEDTIME ASNEEDED FOR SLEEP     Allergies:   Patient has no known allergies.   Social History   Socioeconomic History  . Marital status: Married    Spouse name: Not on file  . Number of children: 2  . Years of education: Not on file  . Highest education level: Not on file  Occupational History  . Occupation: Charity fundraiser  Tobacco Use  . Smoking status: Former Smoker    Packs/day: 1.00    Years: 15.00    Pack years: 15.00    Types: Cigarettes    Quit date: 06/15/1983    Years since quitting: 36.8  . Smokeless tobacco: Current User    Types: Chew  Vaping Use  . Vaping Use: Never used  Substance and Sexual Activity  . Alcohol use: Yes    Comment: every once in awhile  . Drug use: No  . Sexual activity: Not Currently  Other Topics Concern  . Not on file  Social History Narrative   [employer] graduate   [place]   Very active   Walks 1 hour daily   Social Determinants of Health   Financial Resource Strain: Low Risk   . Difficulty of Paying Living Expenses: Not hard at all  Food Insecurity: No Food Insecurity  . Worried About Charity fundraiser in the Last Year: Never true  . Ran Out of Food in the Last Year: Never true  Transportation Needs: No Transportation Needs  . Lack of Transportation (Medical): No  . Lack of Transportation (Non-Medical): No  Physical Activity: Sufficiently Active  . Days of Exercise per Week: 7 days  . Minutes of Exercise per Session: 60 min  Stress: No Stress Concern Present  . Feeling of Stress : Not at all  Social Connections: Socially Integrated  . Frequency of Communication with Friends and Family: More than three times a week  . Frequency of Social Gatherings with Friends and Family: More than three times a week  . Attends Religious Services: 1 to 4 times per year  . Active Member of Clubs or  Organizations: Yes  . Attends Archivist Meetings: 1 to 4 times per year  . Marital Status: Married     Family History: The patient's family history includes Alcohol abuse in his mother; Diabetes in his mother; Mental retardation in his mother.  ROS:   Please see the history of present illness.     All other systems reviewed  and are negative.  EKGs/Labs/Other Studies Reviewed:    The following studies were reviewed today:   EKG:  EKG is  ordered today.  The ekg ordered today demonstrates normal sinus rhythm, normal ECG.  Recent Labs: 03/29/2020: ALT 43; BUN 15; Creatinine, Ser 0.94; Hemoglobin 14.8; Platelets 149.0; Potassium 4.0; Sodium 140; TSH 2.12  Recent Lipid Panel    Component Value Date/Time   CHOL 202 (H) 03/29/2020 1022   TRIG 77.0 03/29/2020 1022   HDL 93.80 03/29/2020 1022   CHOLHDL 2 03/29/2020 1022   VLDL 15.4 03/29/2020 1022   LDLCALC 93 03/29/2020 1022     Risk Assessment/Calculations:      Physical Exam:    VS:  BP (!) 160/70 (BP Location: Right Arm, Patient Position: Sitting, Cuff Size: Normal)   Pulse 69   Ht 6' (1.829 m)   Wt 170 lb 2 oz (77.2 kg)   SpO2 98%   BMI 23.07 kg/m     Wt Readings from Last 3 Encounters:  04/04/20 170 lb 2 oz (77.2 kg)  03/31/20 169 lb 3.2 oz (76.7 kg)  11/04/19 165 lb (74.8 kg)     GEN:  Well nourished, well developed in no acute distress HEENT: Normal NECK: No JVD; No carotid bruits LYMPHATICS: No lymphadenopathy CARDIAC: RRR, 2/6 systolic murmur, right sternal border RESPIRATORY:  Clear to auscultation without rales, wheezing or rhonchi  ABDOMEN: Soft, non-tender, non-distended MUSCULOSKELETAL:  No edema; No deformity  SKIN: Warm and dry NEUROLOGIC:  Alert and oriented x 3 PSYCHIATRIC:  Normal affect   ASSESSMENT:    1. Systolic murmur   2. Elevated BP without diagnosis of hypertension    PLAN:    In order of problems listed above:  1. Patient with right upper sternal border systolic  murmur.  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  Will get echocardiogram to evaluate significant valve pathology. 2. BP elevated, no history of hypertension, previous blood pressures have been normal.  Likely secondary to exertion, walking to office.  No indication for medications at this time.  Continue to monitor.  Follow-up after echocardiogram.    Shared Decision Making/Informed Consent      Medication Adjustments/Labs and Tests Ordered: Current medicines are reviewed at length with the patient today.  Concerns regarding medicines are outlined above.  Orders Placed This Encounter  Procedures  . EKG 12-Lead   No orders of the defined types were placed in this encounter.   Patient Instructions  Medication Instructions:  Your physician recommends that you continue on your current medications as directed. Please refer to the Current Medication list given to you today.  *If you need a refill on your cardiac medications before your next appointment, please call your pharmacy*   Lab Work: None Ordered If you have labs (blood work) drawn today and your tests are completely normal, you will receive your results only by: Marland Kitchen MyChart Message (if you have MyChart) OR . A paper copy in the mail If you have any lab test that is abnormal or we need to change your treatment, we will call you to review the results.   Testing/Procedures:  1.  Your physician has requested that you have an echocardiogram. Echocardiography is a painless test that uses sound waves to create images of your heart. It provides your doctor with information about the size and shape of your heart and how well your heart's chambers and valves are working. This procedure takes approximately one hour. There are no restrictions for this procedure.  Follow-Up: At [facility], you and your health needs are our priority.  As part of our continuing mission to provide you with exceptional heart care, we have  created designated Provider Care Teams.  These Care Teams include your primary Cardiologist (physician) and Advanced Practice Providers (APPs -  Physician Assistants and Nurse Practitioners) who all work together to provide you with the care you need, when you need it.  We recommend signing up for the patient portal called "MyChart".  Sign up information is provided on this After Visit Summary.  MyChart is used to connect with patients for Virtual Visits (Telemedicine).  Patients are able to view lab/test results, encounter notes, upcoming appointments, etc.  Non-urgent messages can be sent to your provider as well.   To learn more about what you can do with MyChart, go to [email or web address].    Your next appointment:   Follow up after Echo   The format for your next appointment:   In Person  Provider:   Kate Sable, MD   Other Instructions      Signed, Kate Sable, MD  04/04/2020 12:33 PM    [place]

## 2020-04-05 DIAGNOSIS — S76111A Strain of right quadriceps muscle, fascia and tendon, initial encounter: Secondary | ICD-10-CM | POA: Diagnosis not present

## 2020-04-06 ENCOUNTER — Ambulatory Visit: Payer: PPO | Admitting: Urology

## 2020-04-07 ENCOUNTER — Other Ambulatory Visit: Payer: Self-pay

## 2020-04-07 ENCOUNTER — Encounter: Payer: Self-pay | Admitting: Urology

## 2020-04-07 ENCOUNTER — Ambulatory Visit: Payer: PPO | Admitting: Urology

## 2020-04-07 ENCOUNTER — Ambulatory Visit
Admission: RE | Admit: 2020-04-07 | Discharge: 2020-04-07 | Disposition: A | Discharge status: Home / Self Care | Payer: PPO | Source: Ambulatory Visit | Attending: Internal Medicine | Admitting: Internal Medicine

## 2020-04-07 VITALS — BP 143/76 | HR 77 | Ht 72.0 in | Wt 165.0 lb

## 2020-04-07 DIAGNOSIS — K7460 Unspecified cirrhosis of liver: Secondary | ICD-10-CM | POA: Insufficient documentation

## 2020-04-07 DIAGNOSIS — E291 Testicular hypofunction: Secondary | ICD-10-CM | POA: Diagnosis not present

## 2020-04-07 DIAGNOSIS — N138 Other obstructive and reflux uropathy: Secondary | ICD-10-CM

## 2020-04-07 DIAGNOSIS — N529 Male erectile dysfunction, unspecified: Secondary | ICD-10-CM | POA: Diagnosis not present

## 2020-04-07 DIAGNOSIS — N401 Enlarged prostate with lower urinary tract symptoms: Secondary | ICD-10-CM

## 2020-04-07 NOTE — Progress Notes (Signed)
04/07/2020 10:27 PM   Tony Luna [birth year] [id number]  Referring provider: [name], MD [place],  [place] [address]  Chief Complaint  Patient presents with  . Erectile Dysfunction    HPI: 79 y.o. male presents for annual follow-up of BPH and erectile dysfunction.   Remains on tamsulosin and has no bothersome lower urinary tract symptoms  ED stable and intermittently uses tadalafil and sildenafil  Still seen at a men's clinic in [place] for subcutaneous testosterone pellet implantation  Recent labs by PCP: PSA 0.75, testosterone 1773   PMH: Past Medical History:  Diagnosis Date  . Allergy   . Arthritis   . Complication of anesthesia    :"need catheter"  . Dysrhythmia    ? something told by reg dr  unable to say what it was  . Esophageal tear   . Hypothyroidism   . Pneumonia    hx  . 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 Rotator cuff tear     Surgical History: Past Surgical History:  Procedure Laterality Date  . blepharaplasty    . CAUTERY OF TURBINATES    . COLONOSCOPY WITH PROPOFOL N/A 07/19/2015   Procedure: COLONOSCOPY WITH PROPOFOL;  Surgeon: [name], MD;  Location: [facility];  Service: Endoscopy;  Laterality: N/A;  . HERNIA REPAIR Left    inguinal herniorrhapy  . KNEE SURGERY     ? loose body/ chondral defect 7yrs  . ROTATOR CUFF REPAIR     left  . SPINE SURGERY    . TONSILLECTOMY    . TOTAL KNEE ARTHROPLASTY Left 06/22/2014   Procedure: TOTAL KNEE ARTHROPLASTY;  Surgeon: [name], MD;  Location: [place];  Service: Orthopedics;  Laterality: Left;    Home Medications:  Allergies as of 04/07/2020   No Known Allergies     Medication List       Accurate as of April 07, 2020 10:27 PM. If you have any questions, ask your nurse or doctor.        Boron 6 MG Tabs Take by mouth daily.   MILK THISTLE PO Take 1 tablet by mouth 2 (two) times daily.   multivitamin with minerals  Tabs tablet Take 1 tablet by mouth 2 (two) times daily.   NON FORMULARY DIM Takes TID.   NON FORMULARY Sharp Thought Takes 2 capsule bid.   NP Thyroid 60 MG tablet Generic drug: thyroid TAKE ONE TABLET BY MOUTH EVERY DAY   OVER THE COUNTER MEDICATION Take 3,000 mg by mouth daily. Omega Cure   OVER THE COUNTER MEDICATION Take 250 mg by mouth daily. Bio Magnesium Citrate   OVER THE COUNTER MEDICATION Take 20,000 mcg by mouth daily. Vitamin B12-methylfolate   OVER THE COUNTER MEDICATION Take 1 tablet by mouth daily. Pure adrenal 400   OVER THE COUNTER MEDICATION Take 800 mcg by mouth daily. Active B Folate   OVER THE COUNTER MEDICATION 2 tablets daily. VitaPrime   OVER THE COUNTER MEDICATION Take 4 sprays by mouth at bedtime. Secretropin oral spray - growth hormone   OVER THE COUNTER MEDICATION Take 1 capsule by mouth 3 (three) times daily. Instaflex   OVER THE COUNTER MEDICATION Take 1,000 mg by mouth daily. Turmeric with Meriva   OVER THE COUNTER MEDICATION 1,000 mg daily. Super Bio-C buffered   OVER THE COUNTER MEDICATION 1,000 mg daily. L'Arginine   OVER THE COUNTER MEDICATION 2 capsules 2 (two) times daily. SLF Forte   OVER THE COUNTER  MEDICATION 1,000 mg. Myomin   Saw Palmetto 160 MG Tabs   tadalafil 20 MG tablet Commonly known as: CIALIS Take 1 tablet (20 mg total) by mouth daily as needed for erectile dysfunction.   tamsulosin 0.4 MG Caps capsule Commonly known as: FLOMAX TAKE 1 CAPSULE EVERY DAY   TAURINE PO Take 1,000 mg by mouth daily.   Testopel 75 MG Pllt Generic drug: Testosterone by Implant route.   VITAMIN D-3 PO Take 5,000 Units by mouth daily. 5,00000 u in the morning, 2,000 u in the evening   zolpidem 5 MG tablet Commonly known as: AMBIEN TAKE 1/2-1 TABLET BY MOUTH AT BEDTIME ASNEEDED FOR SLEEP       Allergies: No Known Allergies  Family History: Family History  Problem Relation Age of Onset  . Alcohol abuse Mother     . Diabetes Mother   . Mental retardation Mother     Social History:  reports that he quit smoking about 36 years ago. His smoking use included cigarettes. He has a 15.00 pack-year smoking history. His smokeless tobacco use includes chew. He reports current alcohol use. He reports that he does not use drugs.   Physical Exam: BP (!) 143/76   Pulse 77   Ht 6' (1.829 m)   Wt 165 lb (74.8 kg)   BMI 22.38 kg/m   Constitutional:  Alert and oriented, No acute distress. HEENT: [place] AT, moist mucus membranes.  Trachea midline, no masses. Cardiovascular: No clubbing, cyanosis, or edema. Respiratory: Normal respiratory effort, no increased work of breathing. GI: Abdomen is soft, nontender, nondistended, no abdominal masses GU: No CVA tenderness Lymph: No cervical or inguinal lymphadenopathy. Skin: No rashes, bruises or suspicious lesions. Neurologic: Grossly intact, no focal deficits, moving all 4 extremities. Psychiatric: Normal mood and affect.   Assessment & Plan:    1.  BPH with LUTS  Stable on tamsulosin  2.  Erectile dysfunction  Stable on PDE 5 inhibitor therapy  3.  Hypogonadism  Managed by a men's clinic in [place] he make the clinic aware of his most recent testosterone levels prior to his next implantation  He does state levels are checked before each implant  Continue annual follow-up   Abbie Sons, [place] [address] [place], [place] [address] [phone]

## 2020-04-08 NOTE — Progress Notes (Signed)
Your annual liver ultrasound which is recommended for all patients with cirrhosis,  due to the increased risk of hepatocellular carcinoma,  was negative for masses.  This should be combined with an annual blood test called an "AFP tumor marker, ."  which was also normal . .    Your gallbladder looked fine, incidentally, no gallstones.    Regards,   Deborra Medina, MD

## 2020-04-14 ENCOUNTER — Telehealth: Payer: Self-pay

## 2020-04-14 NOTE — Telephone Encounter (Signed)
Pt dropped off envelope that reads "personal" on the front. Placed in folder up front

## 2020-04-14 NOTE — Telephone Encounter (Signed)
Given to Dr. Derrel Nip.

## 2020-04-15 ENCOUNTER — Other Ambulatory Visit: Payer: Self-pay | Admitting: Internal Medicine

## 2020-04-15 DIAGNOSIS — N529 Male erectile dysfunction, unspecified: Secondary | ICD-10-CM

## 2020-04-26 ENCOUNTER — Ambulatory Visit (INDEPENDENT_AMBULATORY_CARE_PROVIDER_SITE_OTHER): Payer: PPO

## 2020-04-26 ENCOUNTER — Other Ambulatory Visit: Payer: Self-pay

## 2020-04-26 DIAGNOSIS — R011 Cardiac murmur, unspecified: Secondary | ICD-10-CM

## 2020-04-26 LAB — ECHOCARDIOGRAM COMPLETE
Area-P 1/2: 2.85 cm2
P 1/2 time: 239 msec
S' Lateral: 2.7 cm

## 2020-04-27 NOTE — Progress Notes (Signed)
Your heart function is normal and there is only mild aortic valve insufficiency .  No workup is likely  needed but keep the appt with the cardiologist

## 2020-05-03 ENCOUNTER — Ambulatory Visit: Payer: PPO | Admitting: Cardiology

## 2020-05-03 ENCOUNTER — Other Ambulatory Visit: Payer: Self-pay

## 2020-05-03 ENCOUNTER — Encounter: Payer: Self-pay | Admitting: Cardiology

## 2020-05-03 VITALS — BP 140/70 | HR 68 | Ht 72.0 in | Wt 171.4 lb

## 2020-05-03 DIAGNOSIS — R030 Elevated blood-pressure reading, without diagnosis of hypertension: Secondary | ICD-10-CM | POA: Diagnosis not present

## 2020-05-03 DIAGNOSIS — I351 Nonrheumatic aortic (valve) insufficiency: Secondary | ICD-10-CM | POA: Diagnosis not present

## 2020-05-03 DIAGNOSIS — R011 Cardiac murmur, unspecified: Secondary | ICD-10-CM

## 2020-05-03 DIAGNOSIS — E291 Testicular hypofunction: Secondary | ICD-10-CM | POA: Diagnosis not present

## 2020-05-03 DIAGNOSIS — Z79890 Hormone replacement therapy (postmenopausal): Secondary | ICD-10-CM | POA: Diagnosis not present

## 2020-05-03 DIAGNOSIS — E038 Other specified hypothyroidism: Secondary | ICD-10-CM | POA: Diagnosis not present

## 2020-05-03 NOTE — Patient Instructions (Signed)

## 2020-05-03 NOTE — Progress Notes (Signed)
Cardiology Office Note:    Date:  05/03/2020   ID:  Tony Luna, DOB 01/23/1941, MRN [id number]  PCP:  [name], MD  [place] Cardiologist:  Kate Sable, MD  [facility] Electrophysiologist:  None   Referring MD: [name], MD   Chief Complaint  Patient presents with  . OTHER    F/u echo no complaints today. Meds reviewed verbally with pt.    History of Present Illness:    Tony Luna is a 79 y.o. male with a hx of hypothyroidism who presents for follow-up.  He was last seen due to a cardiac murmur noted on exam.  Denies any cardiac symptoms of chest pain, shortness of breath or edema.  Echocardiogram was ordered to evaluate any significant valvular or structural pathology.  Blood pressures were elevated during last visit, denies any history of hypertension.  Checks his blood pressure every other day at home, typically runs around 235-361 systolics.  Prior notes  had a recent fall, with right MCL tear.  Underwent surgery and physical therapy, with good results.  Trying to get back to exercising on his elliptical.  Denies any chest pain or shortness of breath with exercising.  Past Medical History:  Diagnosis Date  . Allergy   . Arthritis   . Complication of anesthesia    :"need catheter"  . Dysrhythmia    ? something told by reg dr  unable to say what it was  . Esophageal tear   . Hypothyroidism   . Pneumonia    hx  . 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 Rotator cuff tear     Past Surgical History:  Procedure Laterality Date  . blepharaplasty    . CAUTERY OF TURBINATES    . COLONOSCOPY WITH PROPOFOL N/A 07/19/2015   Procedure: COLONOSCOPY WITH PROPOFOL;  Surgeon: [name], MD;  Location: [facility];  Service: Endoscopy;  Laterality: N/A;  . HERNIA REPAIR Left    inguinal herniorrhapy  . KNEE SURGERY     ? loose body/ chondral defect 86yrs  . ROTATOR CUFF REPAIR     left  . SPINE SURGERY    . TONSILLECTOMY    .  TOTAL KNEE ARTHROPLASTY Left 06/22/2014   Procedure: TOTAL KNEE ARTHROPLASTY;  Surgeon: [name], MD;  Location: [place];  Service: Orthopedics;  Laterality: Left;    Current Medications: Current Meds  Medication Sig  . Boron 6 MG TABS Take by mouth daily.  . Cholecalciferol (VITAMIN D-3 PO) Take 5,000 Units by mouth daily. 5,00000 u in the morning, 2,000 u in the evening  . MILK THISTLE PO Take 1 tablet by mouth 2 (two) times daily.  . Multiple Vitamin (MULTIVITAMIN WITH MINERALS) TABS tablet Take 1 tablet by mouth 2 (two) times daily.  . NON FORMULARY DIM Takes TID.  Marland Kitchen NON FORMULARY Sharp Thought Takes 2 capsule bid.  . NP THYROID 60 MG tablet TAKE ONE TABLET BY MOUTH EVERY DAY  . OVER THE COUNTER MEDICATION Take 4 sprays by mouth at bedtime. Secretropin oral spray - growth hormone  . OVER THE COUNTER MEDICATION Take 1 capsule by mouth 3 (three) times daily. Instaflex  . OVER THE COUNTER MEDICATION Take 1,000 mg by mouth daily. Turmeric with Meriva  . OVER THE COUNTER MEDICATION Take 3,000 mg by mouth daily. Omega Cure  . OVER THE COUNTER MEDICATION Take 250 mg by mouth daily. Bio Magnesium Citrate  . OVER THE COUNTER MEDICATION Take 20,000 mcg  by mouth daily. Vitamin B12-methylfolate  . OVER THE COUNTER MEDICATION Take 1 tablet by mouth daily. Pure adrenal 400  . OVER THE COUNTER MEDICATION Take 800 mcg by mouth daily. Active B Folate  . OVER THE COUNTER MEDICATION 2 tablets daily. VitaPrime  . OVER THE COUNTER MEDICATION 1,000 mg daily. Super Bio-C buffered  . OVER THE COUNTER MEDICATION 1,000 mg daily. L'Arginine  . OVER THE COUNTER MEDICATION 2 capsules 2 (two) times daily. SLF Forte  . OVER THE COUNTER MEDICATION 1,000 mg. Myomin  . Saw Palmetto 160 MG TABS   . tadalafil (CIALIS) 20 MG tablet TAKE 1 TABLET BY MOUTH DAILY AS NEEDED FOR ED  . tamsulosin (FLOMAX) 0.4 MG CAPS capsule TAKE 1 CAPSULE EVERY DAY  . TAURINE PO Take 1,000 mg by mouth daily.   . Testosterone (TESTOPEL)  75 MG PLLT by Implant route.  Marland Kitchen zolpidem (AMBIEN) 5 MG tablet TAKE 1/2-1 TABLET BY MOUTH AT BEDTIME ASNEEDED FOR SLEEP     Allergies:   Patient has no known allergies.   Social History   Socioeconomic History  . Marital status: Married    Spouse name: Not on file  . Number of children: 2  . Years of education: Not on file  . Highest education level: Not on file  Occupational History  . Occupation: Charity fundraiser  Tobacco Use  . Smoking status: Former Smoker    Packs/day: 1.00    Years: 15.00    Pack years: 15.00    Types: Cigarettes    Quit date: 06/15/1983    Years since quitting: 36.9  . Smokeless tobacco: Current User    Types: Chew  Vaping Use  . Vaping Use: Never used  Substance and Sexual Activity  . Alcohol use: Yes    Comment: every once in awhile  . Drug use: No  . Sexual activity: Not Currently  Other Topics Concern  . Not on file  Social History Narrative   [employer] graduate   [place] hunter   Very active   Walks 1 hour daily   Social Determinants of Health   Financial Resource Strain: Low Risk   . Difficulty of Paying Living Expenses: Not hard at all  Food Insecurity: No Food Insecurity  . Worried About Charity fundraiser in the Last Year: Never true  . Ran Out of Food in the Last Year: Never true  Transportation Needs: No Transportation Needs  . Lack of Transportation (Medical): No  . Lack of Transportation (Non-Medical): No  Physical Activity: Sufficiently Active  . Days of Exercise per Week: 7 days  . Minutes of Exercise per Session: 60 min  Stress: No Stress Concern Present  . Feeling of Stress : Not at all  Social Connections: Socially Integrated  . Frequency of Communication with Friends and Family: More than three times a week  . Frequency of Social Gatherings with Friends and Family: More than three times a week  . Attends Religious Services: 1 to 4 times per year  . Active Member of Clubs or Organizations: Yes  . Attends Archivist  Meetings: 1 to 4 times per year  . Marital Status: Married     Family History: The patient's family history includes Alcohol abuse in his mother; Diabetes in his mother; Mental retardation in his mother.  ROS:   Please see the history of present illness.     All other systems reviewed and are negative.  EKGs/Labs/Other Studies Reviewed:    The following studies were  reviewed today:   EKG:  EKG not ordered today.   Recent Labs: 03/29/2020: ALT 43; BUN 15; Creatinine, Ser 0.94; Hemoglobin 14.8; Platelets 149.0; Potassium 4.0; Sodium 140; TSH 2.12  Recent Lipid Panel    Component Value Date/Time   CHOL 202 (H) 03/29/2020 1022   TRIG 77.0 03/29/2020 1022   HDL 93.80 03/29/2020 1022   CHOLHDL 2 03/29/2020 1022   VLDL 15.4 03/29/2020 1022   LDLCALC 93 03/29/2020 1022     Risk Assessment/Calculations:      Physical Exam:    VS:  BP 140/70 (BP Location: Left Arm, Patient Position: Sitting, Cuff Size: Normal)   Pulse 68   Ht 6' (1.829 m)   Wt 171 lb 6 oz (77.7 kg)   SpO2 97%   BMI 23.24 kg/m     Wt Readings from Last 3 Encounters:  05/03/20 171 lb 6 oz (77.7 kg)  04/07/20 165 lb (74.8 kg)  04/04/20 170 lb 2 oz (77.2 kg)     GEN:  Well nourished, well developed in no acute distress HEENT: Normal NECK: No JVD; No carotid bruits LYMPHATICS: No lymphadenopathy CARDIAC: RRR, 2/6 systolic murmur, right sternal border RESPIRATORY:  Clear to auscultation without rales, wheezing or rhonchi  ABDOMEN: Soft, non-tender, non-distended MUSCULOSKELETAL:  No edema; No deformity  SKIN: Warm and dry NEUROLOGIC:  Alert and oriented x 3 PSYCHIATRIC:  Normal affect   ASSESSMENT:    1. Systolic murmur   2. Aortic valve insufficiency, etiology of cardiac valve disease unspecified   3. Elevated BP without diagnosis of hypertension      PLAN:    In order of problems listed above:  1. Systolic murmur noted on cardiac exam.  Echo shows normal systolic function, impaired  relaxation, aortic valve sclerosis.  Aortic valve sclerosis likely source of murmur.   2. Mild aortic regurgitation noted.  Will monitor with serial echoes. 3. BP elevated, usually normal at home.  Patient likely has a component of whitecoat syndrome.  Advised to check blood pressure frequently at home.  If BP becomes elevated, recommend starting antihypertensive.  Follow-up in 1 year    Shared Decision Making/Informed Consent      Medication Adjustments/Labs and Tests Ordered: Current medicines are reviewed at length with the patient today.  Concerns regarding medicines are outlined above.  No orders of the defined types were placed in this encounter.  No orders of the defined types were placed in this encounter.   Patient Instructions  Medication Instructions:  Your physician recommends that you continue on your current medications as directed. Please refer to the Current Medication list given to you today. *If you need a refill on your cardiac medications before your next appointment, please call your pharmacy*   Lab Work: None Ordered If you have labs (blood work) drawn today and your tests are completely normal, you will receive your results only by: Marland Kitchen MyChart Message (if you have MyChart) OR . A paper copy in the mail If you have any lab test that is abnormal or we need to change your treatment, we will call you to review the results.   Testing/Procedures: None Ordered   Follow-Up: At [facility], you and your health needs are our priority.  As part of our continuing mission to provide you with exceptional heart care, we have created designated Provider Care Teams.  These Care Teams include your primary Cardiologist (physician) and Advanced Practice Providers (APPs -  Physician Assistants and Nurse Practitioners) who all work together  to provide you with the care you need, when you need it.  We recommend signing up for the patient portal called "MyChart".  Sign up  information is provided on this After Visit Summary.  MyChart is used to connect with patients for Virtual Visits (Telemedicine).  Patients are able to view lab/test results, encounter notes, upcoming appointments, etc.  Non-urgent messages can be sent to your provider as well.   To learn more about what you can do with MyChart, go to [email or web address].    Your next appointment:   1 year(s)  The format for your next appointment:   In Person  Provider:   Kate Sable, MD   Other Instructions      Signed, Kate Sable, MD  05/03/2020 11:26 AM    [place]

## 2020-05-05 DIAGNOSIS — Z6823 Body mass index (BMI) 23.0-23.9, adult: Secondary | ICD-10-CM | POA: Diagnosis not present

## 2020-05-05 DIAGNOSIS — E038 Other specified hypothyroidism: Secondary | ICD-10-CM | POA: Diagnosis not present

## 2020-05-05 DIAGNOSIS — M2550 Pain in unspecified joint: Secondary | ICD-10-CM | POA: Diagnosis not present

## 2020-05-05 DIAGNOSIS — R6882 Decreased libido: Secondary | ICD-10-CM | POA: Diagnosis not present

## 2020-05-05 DIAGNOSIS — E291 Testicular hypofunction: Secondary | ICD-10-CM | POA: Diagnosis not present

## 2020-05-05 DIAGNOSIS — R5383 Other fatigue: Secondary | ICD-10-CM | POA: Diagnosis not present

## 2020-05-24 ENCOUNTER — Other Ambulatory Visit: Payer: Self-pay | Admitting: Internal Medicine

## 2020-06-09 DIAGNOSIS — G479 Sleep disorder, unspecified: Secondary | ICD-10-CM | POA: Diagnosis not present

## 2020-06-09 DIAGNOSIS — R6882 Decreased libido: Secondary | ICD-10-CM | POA: Diagnosis not present

## 2020-06-09 DIAGNOSIS — E291 Testicular hypofunction: Secondary | ICD-10-CM | POA: Diagnosis not present

## 2020-06-15 ENCOUNTER — Ambulatory Visit: Payer: PPO

## 2020-06-16 ENCOUNTER — Ambulatory Visit (INDEPENDENT_AMBULATORY_CARE_PROVIDER_SITE_OTHER): Payer: PPO

## 2020-06-16 VITALS — Ht 72.0 in | Wt 171.0 lb

## 2020-06-16 DIAGNOSIS — Z0000 Encounter for general adult medical examination without abnormal findings: Secondary | ICD-10-CM

## 2020-06-16 NOTE — Patient Instructions (Addendum)
Tony Luna , Thank you for taking time to come for your Medicare Wellness Visit. I appreciate your ongoing commitment to your health goals. Please review the following plan we discussed and let me know if I can assist you in the future.   These are the goals we discussed: Goals      Patient Stated   .  DIET - INCREASE WATER INTAKE (pt-stated)      Stay hydrated       This is a list of the screening recommended for you and due dates:  Health Maintenance  Topic Date Due  . COVID-19 Vaccine (4 - Booster for Pfizer series) 10/27/2020  . Colon Cancer Screening  12/07/2020  . Tetanus Vaccine  78/02/2028  . Flu Shot  Completed  .  Hepatitis C: One time screening is recommended by Center for Disease Control  (CDC) for  adults born from [birth year] through 1963.   Completed  . Pneumonia vaccines  Completed    Immunizations Immunization History  Administered Date(s) Administered  . Fluad Quad(high Dose 65+) 04/27/2019, 03/31/2020  . Hep A / Hep B 12/13/2015, 01/17/2016, 06/26/2016  . Influenza Whole 02/26/2008  . Influenza, High Dose Seasonal PF 04/23/2018  . Influenza,inj,Quad PF,6+ Mos 03/12/2013, 02/24/2014  . Influenza-Unspecified 02/09/2016  . PFIZER(Purple Top)SARS-COV-2 Vaccination 06/24/2019, 07/15/2019, 04/28/2020  . Pneumococcal Conjugate-13 08/06/2013  . Pneumococcal Polysaccharide-23 05/24/2011  . Td 05/28/2004  . Tdap 02/13/2006, 06/06/2017  . Zoster 05/30/2007   Keep all routine maintenance appointments.   Nurse visit 06/21/20 @ 9:00, prolia  Next scheduled lab 06/30/20 @ 8:30  Advanced directives: on file  Conditions/risks identified: none new  Follow up in one year for your annual wellness visit.   Preventive Care 78 Years and Older, Male Preventive care refers to lifestyle choices and visits with your health care provider that can promote health and wellness. What does preventive care include?  A yearly physical exam. This is also called an annual well  check.  Dental exams once or twice a year.  Routine eye exams. Ask your health care provider how often you should have your eyes checked.  Personal lifestyle choices, including:  Daily care of your teeth and gums.  Regular physical activity.  Eating a healthy diet.  Avoiding tobacco and drug use.  Limiting alcohol use.  Practicing safe sex.  Taking low doses of aspirin every day.  Taking vitamin and mineral supplements as recommended by your health care provider. What happens during an annual well check? The services and screenings done by your health care provider during your annual well check will depend on your age, overall health, lifestyle risk factors, and family history of disease. Counseling  Your health care provider may ask you questions about your:  Alcohol use.  Tobacco use.  Drug use.  Emotional well-being.  Home and relationship well-being.  Sexual activity.  Eating habits.  History of falls.  Memory and ability to understand (cognition).  Work and work Statistician. Screening  You may have the following tests or measurements:  Height, weight, and BMI.  Blood pressure.  Lipid and cholesterol levels. These may be checked every 5 years, or more frequently if you are over 61 years old.  Skin check.  Lung cancer screening. You may have this screening every year starting at age 61 if you have a 30-pack-year history of smoking and currently smoke or have quit within the past 15 years.  Fecal occult blood test (FOBT) of the stool. You may have this test  every year starting at age 63.  Flexible sigmoidoscopy or colonoscopy. You may have a sigmoidoscopy every 5 years or a colonoscopy every 10 years starting at age 18.  Prostate cancer screening. 78commen dations will vary depending on your family history and other risks.  Hepatitis C blood test.  Hepatitis B blood test.  Sexually transmitted disease (STD) testing.  Diabetes screening. This is  done by checking your blood sugar (glucose) after you have not eaten for a while (fasting). You may have this done every 1-3 years.  Abdominal aortic aneurysm (AAA) screening. You may need this if you are a current or former smoker.  Osteoporosis. You may be screened starting at age 78 if you are at high risk. Talk with your health care provider about your test results, treatment options, and if necessary, the need for more tests. Vaccines  Your health care provider may recommend certain vaccines, such as:  Influenza vaccine. This is recommended every year.  Tetanus, diphtheria, and acellular pertussis (Tdap, Td) vaccine. You may need a Td booster every 10 years.  Zoster vaccine. You may need this after age 17.  Pneumococcal 13-valent conjugate (PCV13) vaccine. One dose is recommended after age 48.  Pneumococcal polysaccharide (PPSV23) vaccine. One dose is recommended after age 62. Talk to your health care provider about which screenings and vaccines you need and how often you need them. This information is not intended to replace advice given to you by your health care provider. Make sure you discuss any questions you have with your health care provider. Document Released: 06/10/2015 Document Revised: 02/01/2016 Document Reviewed: 03/15/2015 Elsevier Interactive Patient Education  2017 [place] Prevention in the Home Falls can cause injuries. They can happen to people of all ages. There are many things you can do to make your home safe and to help prevent falls. What can I do on the outside of my home?  Regularly fix the edges of walkways and driveways and fix any cracks.  Remove anything that might make you trip as you walk through a door, such as a raised step or threshold.  Trim any bushes or trees on the path to your home.  Use bright outdoor lighting.  Clear any walking paths of anything that might make someone trip, such as rocks or tools.  Regularly check to  see if handrails are loose or broken. Make sure that both sides of any steps have handrails.  Any raised decks and porches should have guardrails on the edges.  Have any leaves, snow, or ice cleared regularly.  Use sand or salt on walking paths during winter.  Clean up any spills in your garage right away. This includes oil or grease spills. What can I do in the bathroom?  Use night lights.  Install grab bars by the toilet and in the tub and shower. Do not use towel bars as grab bars.  Use non-skid mats or decals in the tub or shower.  If you need to sit down in the shower, use a plastic, non-slip stool.  Keep the floor dry. Clean up any water that spills on the floor as soon as it happens.  Remove soap buildup in the tub or shower regularly.  Attach bath mats securely with double-sided non-slip rug tape.  Do not have throw rugs and other things on the floor that can make you trip. What can I do in the bedroom?  Use night lights.  Make sure that you have a light by your bed  that is easy to reach.  Do not use any sheets or blankets that are too big for your bed. They should not hang down onto the floor.  Have a firm chair that has side arms. You can use this for support while you get dressed.  Do not have throw rugs and other things on the floor that can make you trip. What can I do in the kitchen?  Clean up any spills right away.  Avoid walking on wet floors.  Keep items that you use a lot in easy-to-reach places.  If you need to reach something above you, use a strong step stool that has a grab bar.  Keep electrical cords out of the way.  Do not use floor polish or wax that makes floors slippery. If you must use wax, use non-skid floor wax.  Do not have throw rugs and other things on the floor that can make you trip. What can I do with my stairs?  Do not leave any items on the stairs.  Make sure that there are handrails on both sides of the stairs and use  them. Fix handrails that are broken or loose. Make sure that handrails are as long as the stairways.  Check any carpeting to make sure that it is firmly attached to the stairs. Fix any carpet that is loose or worn.  Avoid having throw rugs at the top or bottom of the stairs. If you do have throw rugs, attach them to the floor with carpet tape.  Make sure that you have a light switch at the top of the stairs and the bottom of the stairs. If you do not have them, ask someone to add them for you. What else can I do to help prevent falls?  Wear shoes that:  Do not have high heels.  Have rubber bottoms.  Are comfortable and fit you well.  Are closed at the toe. Do not wear sandals.  If you use a stepladder:  Make sure that it is fully opened. Do not climb a closed stepladder.  Make sure that both sides of the stepladder are locked into place.  Ask someone to hold it for you, if possible.  Clearly mark and make sure that you can see:  Any grab bars or handrails.  First and last steps.  Where the edge of each step is.  Use tools that help you move around (mobility aids) if they are needed. These include:  Canes.  Walkers.  Scooters.  Crutches.  Turn on the lights when you go into a dark area. Replace any light bulbs as soon as they burn out.  Set up your furniture so you have a clear path. Avoid moving your furniture around.  If any of your floors are uneven, fix them.  If there are any pets around you, be aware of where they are.  Review your medicines with your doctor. Some medicines can make you feel dizzy. This can increase your chance of falling. Ask your doctor what other things that you can do to help prevent falls. This information is not intended to replace advice given to you by your health care provider. Make sure you discuss any questions you have with your health care provider. Document Released: 03/10/2009 Document Revised: 10/20/2015 Document Reviewed:  06/18/2014 Elsevier Interactive Patient Education  2017 [employer].

## 2020-06-16 NOTE — Progress Notes (Signed)
Subjective:   Tony Luna is a 80 y.o. male who presents for Medicare Annual/Subsequent preventive examination.  Review of Systems    No ROS.  Medicare Wellness Virtual Visit.   Cardiac Risk Factors include: advanced age (>42men, >29 women);male gender     Objective:    Today's Vitals   06/16/20 1411  Weight: 171 lb (77.6 kg)  Height: 6' (1.829 m)   Body mass index is 23.19 kg/m.  Advanced Directives 06/16/2020 06/15/2019 06/09/2018 06/06/2017 01/10/2017 05/29/2016 07/19/2015  Does Patient Have a Medical Advance Directive? Yes Yes Yes Yes Yes Yes Yes  Type of Paramedic of [place];Living will [place];Living will [address];Living will;Out of facility DNR (pink MOST or yellow form) Living will;Healthcare Power of [place];Living will [place];Living will -  Does patient want to make changes to medical advance directive? No - Patient declined No - Patient declined No - Patient declined No - Patient declined - No - Patient declined -  Copy of [place] in Chart? Yes - validated most recent copy scanned in chart (See row information) Yes - validated most recent copy scanned in chart (See row information) Yes - validated most recent copy scanned in chart (See row information) No - copy requested - Yes -    Current Medications (verified) Outpatient Encounter Medications as of 06/16/2020  Medication Sig  . Boron 6 MG TABS Take by mouth daily.  . Cholecalciferol (VITAMIN D-3 PO) Take 5,000 Units by mouth daily. 5,00000 u in the morning, 2,000 u in the evening  . MILK THISTLE PO Take 1 tablet by mouth 2 (two) times daily.  . Multiple Vitamin (MULTIVITAMIN WITH MINERALS) TABS tablet Take 1 tablet by mouth 2 (two) times daily.  . NON FORMULARY DIM Takes TID.  Marland Kitchen NON FORMULARY Sharp Thought Takes 2 capsule bid.  . NP THYROID 60 MG tablet TAKE ONE TABLET BY MOUTH EVERY  DAY  . OVER THE COUNTER MEDICATION Take 4 sprays by mouth at bedtime. Secretropin oral spray - growth hormone  . OVER THE COUNTER MEDICATION Take 1 capsule by mouth 3 (three) times daily. Instaflex  . OVER THE COUNTER MEDICATION Take 1,000 mg by mouth daily. Turmeric with Meriva  . OVER THE COUNTER MEDICATION Take 3,000 mg by mouth daily. Omega Cure  . OVER THE COUNTER MEDICATION Take 250 mg by mouth daily. Bio Magnesium Citrate  . OVER THE COUNTER MEDICATION Take 20,000 mcg by mouth daily. Vitamin B12-methylfolate  . OVER THE COUNTER MEDICATION Take 1 tablet by mouth daily. Pure adrenal 400  . OVER THE COUNTER MEDICATION Take 800 mcg by mouth daily. Active B Folate  . OVER THE COUNTER MEDICATION 2 tablets daily. VitaPrime  . OVER THE COUNTER MEDICATION 1,000 mg daily. Super Bio-C buffered  . OVER THE COUNTER MEDICATION 1,000 mg daily. L'Arginine  . OVER THE COUNTER MEDICATION 2 capsules 2 (two) times daily. SLF Forte  . OVER THE COUNTER MEDICATION 1,000 mg. Myomin  . Saw Palmetto 160 MG TABS   . tadalafil (CIALIS) 20 MG tablet TAKE 1 TABLET BY MOUTH DAILY AS NEEDED FOR ED  . tamsulosin (FLOMAX) 0.4 MG CAPS capsule TAKE 1 CAPSULE EVERY DAY  . TAURINE PO Take 1,000 mg by mouth daily.   . Testosterone (TESTOPEL) 75 MG PLLT by Implant route.  Marland Kitchen zolpidem (AMBIEN) 5 MG tablet TAKE 1/2-1 TABLET BY MOUTH AT BEDTIME ASNEEDED FOR SLEEP   No  facility-administered encounter medications on file as of 06/16/2020.    Allergies (verified) Patient has no known allergies.   History: Past Medical History:  Diagnosis Date  . Allergy   . Arthritis   . Complication of anesthesia    :"need catheter"  . Dysrhythmia    ? something told by reg dr  unable to say what it was  . Esophageal tear   . Hypothyroidism   . Pneumonia    hx  . 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 Rotator cuff tear    Past Surgical History:  Procedure Laterality Date  . blepharaplasty    . CAUTERY OF TURBINATES     . COLONOSCOPY WITH PROPOFOL N/A 07/19/2015   Procedure: COLONOSCOPY WITH PROPOFOL;  Surgeon: [name], MD;  Location: [facility] ENDOSCOPY;  Service: Endoscopy;  Laterality: N/A;  . HERNIA REPAIR Left    inguinal herniorrhapy  . KNEE SURGERY     ? loose body/ chondral defect 15yrs  . ROTATOR CUFF REPAIR     left  . SPINE SURGERY    . TONSILLECTOMY    . 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 Alcohol abuse Mother   . Diabetes Mother   . Mental retardation Mother    Social History   Socioeconomic History  . Marital status: Married    Spouse name: Not on file  . Number of children: 2  . Years of education: Not on file  . Highest education level: Not on file  Occupational History  . Occupation: Charity fundraiser  Tobacco Use  . Smoking status: Former Smoker    Packs/day: 1.00    Years: 15.00    Pack years: 15.00    Types: Cigarettes    Quit date: 06/15/1983    Years since quitting: 37.0  . Smokeless tobacco: Current User    Types: Chew  Vaping Use  . Vaping Use: Never used  Substance and Sexual Activity  . Alcohol use: Yes    Comment: every once in awhile  . Drug use: No  . Sexual activity: Not Currently  Other Topics Concern  . Not on file  Social History Narrative   [employer] graduate   [place] hunter   Very active   Walks 1 hour daily   Social Determinants of Health   Financial Resource Strain: Low Risk   . Difficulty of Paying Living Expenses: Not hard at all  Food Insecurity: No Food Insecurity  . Worried About Charity fundraiser in the Last Year: Never true  . Ran Out of Food in the Last Year: Never true  Transportation Needs: No Transportation Needs  . Lack of Transportation (Medical): No  . Lack of Transportation (Non-Medical): No  Physical Activity: Sufficiently Active  . Days of Exercise  per Week: 7 days  . Minutes of Exercise per Session: 60 min  Stress: No Stress Concern Present  . Feeling of Stress : Not at all  Social Connections: Socially Integrated  . Frequency of Communication with Friends and Family: More than three times a week  . Frequency of Social Gatherings with Friends and Family: More than three times a week  . Attends Religious Services: 1 to 4 times per year  . Active Member of Clubs or Organizations: Yes  . Attends Archivist Meetings: 1 to 4  times per year  . Marital Status: Married    Tobacco Counseling Ready to quit: Not Answered Counseling given: Not Answered   Clinical Intake:  Pre-visit preparation completed: Yes        Diabetes: No  How often do you need to have someone help you when you read instructions, pamphlets, or other written materials from your doctor or pharmacy?: 1 - Never  Interpreter Needed?: No      Activities of Daily Living In your present state of health, do you have any difficulty performing the following activities: 06/16/2020  Hearing? Y  Vision? N  Difficulty concentrating or making decisions? N  Walking or climbing stairs? N  Dressing or bathing? N  Doing errands, shopping? N  Preparing Food and eating ? N  Using the Toilet? N  In the past six months, have you accidently leaked urine? N  Do you have problems with loss of bowel control? N  Managing your Medications? N  Managing your Finances? N  Housekeeping or managing your Housekeeping? N  Some recent data might be hidden    Patient Care Team: [name], MD as PCP - General (Internal Medicine) Kate Sable, MD as PCP - Cardiology (Cardiology)  Indicate any recent Medical Services you may have received from other than [facility] providers in the past year (date may be approximate).     Assessment:   This is a routine wellness examination for [place].  I connected with [place] today by telephone and verified that I am speaking  with the correct person using two identifiers. Location patient: home Location provider: work Persons participating in the virtual visit: patient, Marine scientist.    I discussed the limitations, risks, security and privacy concerns of performing an evaluation and management service by telephone and the availability of in person appointments. The patient expressed understanding and verbally consented to this telephonic visit.    Interactive audio and video telecommunications were attempted between this provider and patient, however failed, due to patient having technical difficulties OR patient did not have access to video capability.  We continued and completed visit with audio only.  Some vital signs may be absent or patient reported.   Hearing/Vision screen  Hearing Screening   125Hz  250Hz  500Hz  1000Hz  2000Hz  3000Hz  4000Hz  6000Hz  8000Hz   Right ear:           Left ear:           Comments: Followed by [facility]  Visits every 6 months Hearing aid, bilateral  Vision Screening Comments: Wears corrective lenses  Visual acuity not assessed, virtual visit. They have seen their ophthalmologist.   Dietary issues and exercise activities discussed: Current Exercise Habits: Home exercise routine, Time (Minutes): 60, Frequency (Times/Week): 7, Weekly Exercise (Minutes/Week): 420, Intensity: Mild  Goals      Patient Stated   .  DIET - INCREASE WATER INTAKE (pt-stated)      Stay hydrated      Depression Screen PHQ 2/9 Scores 06/16/2020 06/15/2019 06/09/2018 04/26/2018 06/06/2017 05/29/2016 04/13/2015  PHQ - 2 Score 0 0 0 0 0 0 0  PHQ- 9 Score - - - - 0 - -    Fall Risk Fall Risk  06/16/2020 03/31/2020 11/04/2019 06/15/2019 06/11/2019  Falls in the past year? 0 1 1 0 0  Number falls in past yr: 0 0 0 - -  Injury with Fall? 0 1 1 - -  Follow up Falls evaluation completed Falls evaluation completed Falls evaluation completed Falls evaluation completed Falls  evaluation completed   FALL RISK PREVENTION  PERTAINING TO THE HOME: Handrails in use when climbing stars? Yes Home free of loose throw rugs in walkways, pet beds, electrical cords, etc? Yes  Adequate lighting in your home to reduce risk of falls? Yes   ASSISTIVE DEVICES UTILIZED TO PREVENT FALLS: Use of a cane, walker or w/c? No .  TIMED UP AND GO: Was the test performed? No . Virtual visit.   Cognitive Function: Patient is alert and oriented x3.  Denies difficulty focusing, making decisions, memory loss.  Enjoys reading.  MMSE/6CIT deferred. Normal by direct communication/observation.   MMSE - Mini Mental State Exam 06/15/2019  Not completed: Unable to complete     6CIT Screen 06/09/2018 06/06/2017 05/29/2016  What Year? 0 points 0 points 0 points  What month? 0 points 0 points 0 points  What time? 0 points 0 points 0 points  Count back from 20 0 points 0 points 0 points  Months in reverse 0 points 0 points 0 points  Repeat phrase 0 points 0 points 0 points  Total Score 0 0 0    Immunizations Immunization History  Administered Date(s) Administered  . Fluad Quad(high Dose 65+) 04/27/2019, 03/31/2020  . Hep A / Hep B 12/13/2015, 01/17/2016, 06/26/2016  . Influenza Whole 02/26/2008  . Influenza, High Dose Seasonal PF 04/23/2018  . Influenza,inj,Quad PF,6+ Mos 03/12/2013, 02/24/2014  . Influenza-Unspecified 02/09/2016  . PFIZER(Purple Top)SARS-COV-2 Vaccination 06/24/2019, 07/15/2019, 04/28/2020  . Pneumococcal Conjugate-13 08/06/2013  . Pneumococcal Polysaccharide-23 05/24/2011  . Td 05/28/2004  . Tdap 02/13/2006, 06/06/2017  . Zoster 05/30/2007   Health Maintenance There are no preventive care reminders to display for this patient. Health Maintenance  Topic Date Due  . COVID-19 Vaccine (4 - Booster for Pfizer series) 10/27/2020  . COLONOSCOPY (Pts 45-28yrs Insurance coverage will need to be confirmed)  12/07/2020  . TETANUS/TDAP  06/07/2027  . INFLUENZA VACCINE  Completed  . Hepatitis C Screening  Completed  .  PNA vac Low Risk Adult  Completed   Colorectal cancer screening: Type of screening: Colonoscopy. Completed 12/08/15. Repeat every 5 years  DG Chest 2 View- 11/04/19.  Bone density- 05/11/19. Osteoporosis. Last prolia injection 12/16/19.  Hepatitis C Screening: Completed 12/26/15.  Dental Screening: Recommended annual dental exams for proper oral hygiene.  Community Resource Referral / Chronic Care Management: CRR required this visit?  No   CCM required this visit?  No      Plan:   Keep all routine maintenance appointments.   Nurse visit 06/21/20 @ 9:00, prolia  Next scheduled lab 06/30/20 @ 8:30  I have personally reviewed and noted the following in the patient's chart:   . Medical and social history . Use of alcohol, tobacco or illicit drugs  . Current medications and supplements . Functional ability and status . Nutritional status . Physical activity . Advanced directives . List of other physicians . Hospitalizations, surgeries, and ER visits in previous 12 months . Vitals . Screenings to include cognitive, depression, and falls . Referrals and appointments  In addition, I have reviewed and discussed with patient certain preventive protocols, quality metrics, and best practice recommendations. A written personalized care plan for preventive services as well as general preventive health recommendations were provided to patient via mychart.     Varney Biles, LPN   [id number]

## 2020-06-21 ENCOUNTER — Other Ambulatory Visit: Payer: Self-pay

## 2020-06-21 ENCOUNTER — Ambulatory Visit (INDEPENDENT_AMBULATORY_CARE_PROVIDER_SITE_OTHER): Payer: PPO

## 2020-06-21 DIAGNOSIS — M810 Age-related osteoporosis without current pathological fracture: Secondary | ICD-10-CM | POA: Diagnosis not present

## 2020-06-21 MED ORDER — DENOSUMAB 60 MG/ML ~~LOC~~ SOSY
60.0000 mg | PREFILLED_SYRINGE | Freq: Once | SUBCUTANEOUS | Status: AC
  Administered 2020-06-21: 60 mg via SUBCUTANEOUS

## 2020-06-21 NOTE — Progress Notes (Signed)
Patient presented for 6-month Prolia injection SQ to left arm. Patient tolerated well. 

## 2020-06-30 ENCOUNTER — Other Ambulatory Visit (INDEPENDENT_AMBULATORY_CARE_PROVIDER_SITE_OTHER): Payer: PPO

## 2020-06-30 ENCOUNTER — Other Ambulatory Visit: Payer: Self-pay

## 2020-06-30 DIAGNOSIS — K7460 Unspecified cirrhosis of liver: Secondary | ICD-10-CM | POA: Diagnosis not present

## 2020-06-30 LAB — COMPREHENSIVE METABOLIC PANEL
ALT: 33 U/L (ref 0–53)
AST: 34 U/L (ref 0–37)
Albumin: 4.3 g/dL (ref 3.5–5.2)
Alkaline Phosphatase: 85 U/L (ref 39–117)
BUN: 20 mg/dL (ref 6–23)
CO2: 30 mEq/L (ref 19–32)
Calcium: 9.2 mg/dL (ref 8.4–10.5)
Chloride: 104 mEq/L (ref 96–112)
Creatinine, Ser: 1.11 mg/dL (ref 0.40–1.50)
GFR: 63.97 mL/min (ref >60.00)
Glucose, Bld: 92 mg/dL (ref 70–99)
Potassium: 4.4 mEq/L (ref 3.5–5.1)
Sodium: 141 mEq/L (ref 135–145)
Total Bilirubin: 0.9 mg/dL (ref 0.2–1.2)
Total Protein: 6.7 g/dL (ref 6.0–8.3)

## 2020-06-30 LAB — GAMMA GT: GGT: 85 U/L — ABNORMAL HIGH (ref 7–51)

## 2020-07-02 NOTE — Progress Notes (Signed)
Your liver enzymes are either normal or improving  Regards,   Deborra Medina, MD

## 2020-07-07 ENCOUNTER — Other Ambulatory Visit: Payer: Self-pay | Admitting: Internal Medicine

## 2020-07-15 DIAGNOSIS — R0781 Pleurodynia: Secondary | ICD-10-CM | POA: Diagnosis not present

## 2020-07-18 ENCOUNTER — Other Ambulatory Visit: Payer: Self-pay | Admitting: Internal Medicine

## 2020-07-19 ENCOUNTER — Other Ambulatory Visit: Payer: Self-pay | Admitting: Internal Medicine

## 2020-07-19 NOTE — Telephone Encounter (Signed)
Duplicate request because pharmacy is saying they have not received. Please advise.

## 2020-07-19 NOTE — Telephone Encounter (Signed)
RX Refill:ambien Last Seen:03-31-20 Last ordered:01-18-20

## 2020-08-24 ENCOUNTER — Other Ambulatory Visit: Payer: Self-pay | Admitting: Internal Medicine

## 2020-08-31 DIAGNOSIS — E038 Other specified hypothyroidism: Secondary | ICD-10-CM | POA: Diagnosis not present

## 2020-08-31 DIAGNOSIS — R5383 Other fatigue: Secondary | ICD-10-CM | POA: Diagnosis not present

## 2020-08-31 DIAGNOSIS — Z79890 Hormone replacement therapy (postmenopausal): Secondary | ICD-10-CM | POA: Diagnosis not present

## 2020-08-31 DIAGNOSIS — E291 Testicular hypofunction: Secondary | ICD-10-CM | POA: Diagnosis not present

## 2020-09-01 DIAGNOSIS — N529 Male erectile dysfunction, unspecified: Secondary | ICD-10-CM | POA: Diagnosis not present

## 2020-09-01 DIAGNOSIS — Z6822 Body mass index (BMI) 22.0-22.9, adult: Secondary | ICD-10-CM | POA: Diagnosis not present

## 2020-09-01 DIAGNOSIS — M2550 Pain in unspecified joint: Secondary | ICD-10-CM | POA: Diagnosis not present

## 2020-09-01 DIAGNOSIS — E291 Testicular hypofunction: Secondary | ICD-10-CM | POA: Diagnosis not present

## 2020-09-02 DIAGNOSIS — E291 Testicular hypofunction: Secondary | ICD-10-CM | POA: Diagnosis not present

## 2020-09-08 DIAGNOSIS — L570 Actinic keratosis: Secondary | ICD-10-CM | POA: Diagnosis not present

## 2020-09-08 DIAGNOSIS — Z08 Encounter for follow-up examination after completed treatment for malignant neoplasm: Secondary | ICD-10-CM | POA: Diagnosis not present

## 2020-09-08 DIAGNOSIS — Z85828 Personal history of other malignant neoplasm of skin: Secondary | ICD-10-CM | POA: Diagnosis not present

## 2020-09-08 DIAGNOSIS — D225 Melanocytic nevi of trunk: Secondary | ICD-10-CM | POA: Diagnosis not present

## 2020-09-08 DIAGNOSIS — X32XXXA Exposure to sunlight, initial encounter: Secondary | ICD-10-CM | POA: Diagnosis not present

## 2020-09-08 DIAGNOSIS — L821 Other seborrheic keratosis: Secondary | ICD-10-CM | POA: Diagnosis not present

## 2020-09-10 ENCOUNTER — Other Ambulatory Visit: Payer: Self-pay | Admitting: Internal Medicine

## 2020-10-06 DIAGNOSIS — M2550 Pain in unspecified joint: Secondary | ICD-10-CM | POA: Diagnosis not present

## 2020-10-06 DIAGNOSIS — N529 Male erectile dysfunction, unspecified: Secondary | ICD-10-CM | POA: Diagnosis not present

## 2020-10-06 DIAGNOSIS — E291 Testicular hypofunction: Secondary | ICD-10-CM | POA: Diagnosis not present

## 2020-10-06 DIAGNOSIS — Z6822 Body mass index (BMI) 22.0-22.9, adult: Secondary | ICD-10-CM | POA: Diagnosis not present

## 2020-10-12 DIAGNOSIS — Z03818 Encounter for observation for suspected exposure to other biological agents ruled out: Secondary | ICD-10-CM | POA: Diagnosis not present

## 2020-10-12 DIAGNOSIS — Z20822 Contact with and (suspected) exposure to covid-19: Secondary | ICD-10-CM | POA: Diagnosis not present

## 2020-10-25 ENCOUNTER — Telehealth: Payer: Self-pay | Admitting: Internal Medicine

## 2020-10-25 NOTE — Telephone Encounter (Signed)
Pt's note has been printed off and placed up front for pick. Pt's wife stated that she would let her husband know.

## 2020-10-25 NOTE — Telephone Encounter (Signed)
Called to speak with Patient about his wife, a patient of Dr Olivia Mackie McLean-Scocuzza, notes have been printed and placed upfront

## 2020-10-25 NOTE — Telephone Encounter (Signed)
PT called to request the notes from his most recent physical. He is needing to pick them up for him and his wife for [facility] as they are needing the notes for his most recent one. He would like to be contacted at [phone] when it is ready to be picked up.

## 2020-10-28 ENCOUNTER — Other Ambulatory Visit: Payer: Self-pay | Admitting: Internal Medicine

## 2020-12-06 ENCOUNTER — Telehealth: Payer: Self-pay | Admitting: Gastroenterology

## 2020-12-06 DIAGNOSIS — E291 Testicular hypofunction: Secondary | ICD-10-CM | POA: Diagnosis not present

## 2020-12-06 DIAGNOSIS — Z79890 Hormone replacement therapy (postmenopausal): Secondary | ICD-10-CM | POA: Diagnosis not present

## 2020-12-06 DIAGNOSIS — E038 Other specified hypothyroidism: Secondary | ICD-10-CM | POA: Diagnosis not present

## 2020-12-06 DIAGNOSIS — R5383 Other fatigue: Secondary | ICD-10-CM | POA: Diagnosis not present

## 2020-12-06 NOTE — Telephone Encounter (Signed)
Spoke with pt regarding Colonoscopy. Advised pt we do not have him scheduled for a colonoscopy. His last visit with Dr. Allen Norris was 2017. Pt stated he would call his PCP to see where his appt is for tomorrow.

## 2020-12-06 NOTE — Telephone Encounter (Signed)
Patient called back, LVM for Ginger to call back to discuss Colonoscopy appointment

## 2020-12-08 ENCOUNTER — Telehealth: Payer: Self-pay | Admitting: Internal Medicine

## 2020-12-08 DIAGNOSIS — G479 Sleep disorder, unspecified: Secondary | ICD-10-CM | POA: Diagnosis not present

## 2020-12-08 DIAGNOSIS — M2550 Pain in unspecified joint: Secondary | ICD-10-CM | POA: Diagnosis not present

## 2020-12-08 DIAGNOSIS — E038 Other specified hypothyroidism: Secondary | ICD-10-CM | POA: Diagnosis not present

## 2020-12-08 DIAGNOSIS — D696 Thrombocytopenia, unspecified: Secondary | ICD-10-CM | POA: Diagnosis not present

## 2020-12-08 DIAGNOSIS — Z789 Other specified health status: Secondary | ICD-10-CM | POA: Diagnosis not present

## 2020-12-08 DIAGNOSIS — E291 Testicular hypofunction: Secondary | ICD-10-CM | POA: Diagnosis not present

## 2020-12-08 DIAGNOSIS — D126 Benign neoplasm of colon, unspecified: Secondary | ICD-10-CM

## 2020-12-08 DIAGNOSIS — R6882 Decreased libido: Secondary | ICD-10-CM | POA: Diagnosis not present

## 2020-12-08 NOTE — Telephone Encounter (Signed)
Referral to Tony Luna in progress for 5 yr follow up colonoscopy

## 2020-12-08 NOTE — Telephone Encounter (Signed)
Patient is requesting a referral to have a colonoscopy.

## 2020-12-08 NOTE — Addendum Note (Signed)
Addended by: [name] on: 12/08/2020 07:37 PM   Modules accepted: Orders

## 2020-12-08 NOTE — Telephone Encounter (Signed)
Pt is due for colonoscopy this month.

## 2020-12-09 NOTE — Telephone Encounter (Signed)
LMTCB. Need to let pt know that the referral for the colonoscopy has been placed and someone from Dr. Dorothey Baseman office will call to schedule.

## 2020-12-13 ENCOUNTER — Other Ambulatory Visit: Payer: Self-pay | Admitting: Internal Medicine

## 2020-12-13 ENCOUNTER — Encounter: Payer: Self-pay | Admitting: *Deleted

## 2020-12-15 ENCOUNTER — Telehealth: Payer: PPO

## 2020-12-15 NOTE — Telephone Encounter (Signed)
Spoke with pt and he is aware that referral has been placed.

## 2020-12-20 ENCOUNTER — Ambulatory Visit (INDEPENDENT_AMBULATORY_CARE_PROVIDER_SITE_OTHER): Payer: PPO

## 2020-12-20 ENCOUNTER — Telehealth: Payer: Self-pay

## 2020-12-20 ENCOUNTER — Other Ambulatory Visit: Payer: Self-pay

## 2020-12-20 ENCOUNTER — Ambulatory Visit: Payer: PPO

## 2020-12-20 DIAGNOSIS — M810 Age-related osteoporosis without current pathological fracture: Secondary | ICD-10-CM

## 2020-12-20 MED ORDER — ZOLPIDEM TARTRATE 5 MG PO TABS: ORAL_TABLET | ORAL | 2 refills | Status: DC

## 2020-12-20 MED ORDER — DENOSUMAB 60 MG/ML ~~LOC~~ SOSY
60.0000 mg | PREFILLED_SYRINGE | Freq: Once | SUBCUTANEOUS | Status: AC
  Administered 2020-12-20: 60 mg via SUBCUTANEOUS

## 2020-12-20 NOTE — Telephone Encounter (Signed)
Refill needed

## 2020-12-20 NOTE — Progress Notes (Signed)
Patient presented for 6-month Prolia injection SQ to left arm. Patient tolerated well. 

## 2020-12-20 NOTE — Telephone Encounter (Signed)
Pt needs a refill on zolpidem (AMBIEN) 5 MG tablet. He states that his bottle says three refills but the pharmacy has not automatically renewed it like usual. He will call the pharmacy. Please advise

## 2020-12-21 NOTE — Telephone Encounter (Signed)
Ambien has been refilled by Dr. Derrel Nip on 12/20/20

## 2020-12-29 ENCOUNTER — Telehealth: Payer: Self-pay | Admitting: Internal Medicine

## 2020-12-29 NOTE — Telephone Encounter (Signed)
Pt would like a call back to discuss some of his Immunizations for [facility]

## 2020-12-30 NOTE — Telephone Encounter (Signed)
Spoken to patient. He stated he needs updated shingles, pneumonia, flushot. He would need rx sent to pharmacy and he would like a call and Detailed VM is ok to leave if not answered.

## 2020-12-30 NOTE — Telephone Encounter (Signed)
Left detailed message for patient. If he has any questions he can return our call for clarification.

## 2021-01-05 DIAGNOSIS — E038 Other specified hypothyroidism: Secondary | ICD-10-CM | POA: Diagnosis not present

## 2021-01-05 DIAGNOSIS — R6882 Decreased libido: Secondary | ICD-10-CM | POA: Diagnosis not present

## 2021-01-05 DIAGNOSIS — G479 Sleep disorder, unspecified: Secondary | ICD-10-CM | POA: Diagnosis not present

## 2021-01-05 DIAGNOSIS — Z6823 Body mass index (BMI) 23.0-23.9, adult: Secondary | ICD-10-CM | POA: Diagnosis not present

## 2021-01-05 DIAGNOSIS — M2550 Pain in unspecified joint: Secondary | ICD-10-CM | POA: Diagnosis not present

## 2021-01-05 DIAGNOSIS — N529 Male erectile dysfunction, unspecified: Secondary | ICD-10-CM | POA: Diagnosis not present

## 2021-01-05 DIAGNOSIS — E291 Testicular hypofunction: Secondary | ICD-10-CM | POA: Diagnosis not present

## 2021-01-18 DIAGNOSIS — H2513 Age-related nuclear cataract, bilateral: Secondary | ICD-10-CM | POA: Diagnosis not present

## 2021-01-31 ENCOUNTER — Ambulatory Visit (INDEPENDENT_AMBULATORY_CARE_PROVIDER_SITE_OTHER): Payer: PPO | Admitting: Internal Medicine

## 2021-01-31 ENCOUNTER — Encounter: Payer: Self-pay | Admitting: Internal Medicine

## 2021-01-31 ENCOUNTER — Other Ambulatory Visit: Payer: Self-pay

## 2021-01-31 VITALS — BP 150/76 | HR 87 | Temp 96.8°F | Ht 72.0 in | Wt 171.6 lb

## 2021-01-31 DIAGNOSIS — M810 Age-related osteoporosis without current pathological fracture: Secondary | ICD-10-CM | POA: Diagnosis not present

## 2021-01-31 DIAGNOSIS — K7460 Unspecified cirrhosis of liver: Secondary | ICD-10-CM | POA: Diagnosis not present

## 2021-01-31 DIAGNOSIS — Z23 Encounter for immunization: Secondary | ICD-10-CM

## 2021-01-31 MED ORDER — TRAMADOL HCL 50 MG PO TABS: 50.0000 mg | ORAL_TABLET | Freq: Four times a day (QID) | ORAL | 0 refills | Status: DC | PRN

## 2021-01-31 MED ORDER — ZOSTER VAC RECOMB ADJUVANTED 50 MCG/0.5ML IM SUSR: 0.5000 mL | Freq: Once | INTRAMUSCULAR | 1 refills | Status: AC

## 2021-01-31 NOTE — Patient Instructions (Addendum)
Good to see you  You got "shot up" today with flu and pneumonia vaccines   Tetanus is up to date  Still need 2 doses of the Shingles vaccine.  Wait 2 weeks ,  expect to feel crappy for 2 days.  2nd dose due by end of 6 months   Annual liver ultrasound and AFP tumor marker due in November and ordered  Tramadol trial at bedtime only for shoulder pain

## 2021-01-31 NOTE — Progress Notes (Signed)
Subjective:  Patient ID: Tony Luna, male    DOB: 25-Mar-1941  Age: 80 y.o. MRN: [id number]  CC: The primary encounter diagnosis was Cirrhosis of liver without ascites, unspecified hepatic cirrhosis type ([place]). Diagnoses of Need for immunization against influenza, Need for pneumococcal vaccination, and Age-related osteoporosis without current pathological fracture were also pertinent to this visit.  HPI Tony Luna presents for  Chief Complaint  Patient presents with   Follow-up    6 month follow up    1) cirrhosis:  compensated,  he denies bleeding/ abdominal distension .    2) DJD  3) BPH  4) thrombocytopenia  5) white coat without HTN.  Home readings on Omron vary from 130/72 to 120/60   6) history of fall   last year with quad tear ,  fully recovered 3 months later ,  surgery by [facility] . Remains physically active.  PT alternating with aerobics activity.   Taking arimidex by [facility]  Insomnia: managed with  ambien  waking up frequently due to shoulder pain.  Discussed trial of tramadol. At bedtime  Cataract surgery and colonoscopy appt for intake     Outpatient Medications Prior to Visit  Medication Sig Dispense Refill   anastrozole (ARIMIDEX) 1 MG tablet Take 1 mg by mouth once a week.     Boron 6 MG TABS Take by mouth daily.     Cholecalciferol (VITAMIN D-3 PO) Take 5,000 Units by mouth daily. 5,00000 u in the morning, 2,000 u in the evening     diphenoxylate-atropine (LOMOTIL) 2.5-0.025 MG tablet Take 1-2 tablets by mouth every 6 (six) hours as needed.     EPINEPHrine 0.3 mg/0.3 mL IJ SOAJ injection Inject into the muscle as directed.     MILK THISTLE PO Take 1 tablet by mouth 2 (two) times daily.     Multiple Vitamin (MULTIVITAMIN WITH MINERALS) TABS tablet Take 1 tablet by mouth 2 (two) times daily.     NON FORMULARY Sharp Thought Takes 2 capsule bid.     NP THYROID 60 MG tablet TAKE 1 TABLET BY MOUTH DAILY 90 tablet 0   OVER THE COUNTER MEDICATION Take 4  sprays by mouth at bedtime. Secretropin oral spray - growth hormone     OVER THE COUNTER MEDICATION Take 1 capsule by mouth 3 (three) times daily. Instaflex     OVER THE COUNTER MEDICATION Take 1,000 mg by mouth daily. Turmeric with Meriva     OVER THE COUNTER MEDICATION Take 3,000 mg by mouth daily. Omega Cure     OVER THE COUNTER MEDICATION Take 250 mg by mouth daily. Bio Magnesium Citrate     OVER THE COUNTER MEDICATION Take 20,000 mcg by mouth daily. Vitamin B12-methylfolate     OVER THE COUNTER MEDICATION Take 1 tablet by mouth daily. Pure adrenal 400     OVER THE COUNTER MEDICATION Take 800 mcg by mouth daily. Active B Folate     OVER THE COUNTER MEDICATION 2 tablets daily. VitaPrime     OVER THE COUNTER MEDICATION 1,000 mg daily. Super Bio-C buffered     OVER THE COUNTER MEDICATION 1,000 mg daily. L'Arginine     OVER THE COUNTER MEDICATION 2 capsules 2 (two) times daily. SLF Forte     OVER THE COUNTER MEDICATION 1,000 mg. Myomin     Saw Palmetto 160 MG TABS      sildenafil (VIAGRA) 50 MG tablet USE AS DIRECTED 90 tablet 0   tadalafil (CIALIS) 20 MG tablet  TAKE 1 TABLET BY MOUTH DAILY AS NEEDED FOR ED 240 tablet 0   tamsulosin (FLOMAX) 0.4 MG CAPS capsule TAKE 1 CAPSULE BY MOUTH ONCE DAILY 90 capsule 1   TAURINE PO Take 1,000 mg by mouth daily.      Testosterone 75 MG PLLT by Implant route.     zolpidem (AMBIEN) 5 MG tablet TAKE 1/2-1 TABLET BY MOUTH AT BEDTIME ASNEEDED FOR SLEEP 30 tablet 2   NON FORMULARY DIM Takes TID.     No facility-administered medications prior to visit.    Review of Systems;  Patient denies headache, fevers, malaise, unintentional weight loss, skin rash, eye pain, sinus congestion and sinus pain, sore throat, dysphagia,  hemoptysis , cough, dyspnea, wheezing, chest pain, palpitations, orthopnea, edema, abdominal pain, nausea, melena, diarrhea, constipation, flank pain, dysuria, hematuria, urinary  Frequency, nocturia, numbness, tingling, seizures,  Focal  weakness, Loss of consciousness,  Tremor, insomnia, depression, anxiety, and suicidal ideation.      Objective:  BP (!) 150/76 (BP Location: Left Arm, Patient Position: Sitting, Cuff Size: Normal)   Pulse 87   Temp (!) 96.8 F (36 C) (Temporal)   Ht 6' (1.829 m)   Wt 171 lb 9.6 oz (77.8 kg)   SpO2 97%   BMI 23.27 kg/m   BP Readings from Last 3 Encounters:  01/31/21 (!) 150/76  05/03/20 140/70  04/07/20 (!) 143/76    Wt Readings from Last 3 Encounters:  01/31/21 171 lb 9.6 oz (77.8 kg)  06/16/20 171 lb (77.6 kg)  05/03/20 171 lb 6 oz (77.7 kg)    General appearance: alert, cooperative and appears stated age Ears: normal TM's and external ear canals both ears Throat: lips, mucosa, and tongue normal; teeth and gums normal Neck: no adenopathy, no carotid bruit, supple, symmetrical, trachea midline and thyroid not enlarged, symmetric, no tenderness/mass/nodules Back: symmetric, no curvature. ROM normal. No CVA tenderness. Lungs: clear to auscultation bilaterally Heart: regular rate and rhythm, S1, S2 normal, no murmur, click, rub or gallop Abdomen: soft, non-tender; bowel sounds normal; no masses,  no organomegaly Pulses: 2+ and symmetric Skin: Skin color, texture, turgor normal. No rashes or lesions Lymph nodes: Cervical, supraclavicular, and axillary nodes normal.  Lab Results  Component Value Date   HGBA1C 5.0 04/30/2019   HGBA1C 4.9 02/17/2018   HGBA1C 5.0 06/06/2017    Lab Results  Component Value Date   CREATININE 1.11 06/30/2020   CREATININE 0.94 03/29/2020   CREATININE 1.12 11/04/2019    Lab Results  Component Value Date   WBC 4.9 03/29/2020   HGB 14.8 03/29/2020   HCT 43.5 03/29/2020   PLT 149.0 (L) 03/29/2020   GLUCOSE 92 06/30/2020   CHOL 202 (H) 03/29/2020   TRIG 77.0 03/29/2020   HDL 93.80 03/29/2020   LDLCALC 93 03/29/2020   ALT 33 06/30/2020   AST 34 06/30/2020   NA 141 06/30/2020   K 4.4 06/30/2020   CL 104 06/30/2020   CREATININE 1.11  06/30/2020   BUN 20 06/30/2020   CO2 30 06/30/2020   TSH 2.12 03/29/2020   PSA 0.75 03/29/2020   INR 1.0 11/04/2019   HGBA1C 5.0 04/30/2019   MICROALBUR 3.7 (H) 04/23/2018    US Abdomen Limited RUQ (LIVER/GB)  Result Date: 04/07/2020 CLINICAL DATA:  History of cirrhosis. EXAM: ULTRASOUND ABDOMEN LIMITED RIGHT UPPER QUADRANT COMPARISON:  Right upper quadrant ultrasound 07/31/2016. FINDINGS: Gallbladder: No gallstones or wall thickening visualized. No sonographic Murphy sign noted by sonographer. Common bile duct: Diameter: 0.2 cm Liver:  The liver is dense and echogenic with a nodular border consistent with cirrhosis. No focal lesion. Portal vein is patent on color Doppler imaging with normal direction of blood flow towards the liver. Other: None. IMPRESSION: Cirrhosis.  Negative for focal liver lesion. Electronically Signed   By: Inge Rise M.D.   On: 04/07/2020 15:32    Assessment & Plan:   Problem List Items Addressed This Visit       Unprioritized   Cirrhosis ([place]) - Primary    Confirmed by elastogram.  Reviewed labs from [facility] via Epic .   Hepatic synthetic function remains normal and he is asymptomatic.  Annual screening for hepatocellular CA needed with AFP and [place] has been ordered        Relevant Orders   AFP tumor marker   US Abdomen Limited RUQ (LIVER/GB)   Comprehensive metabolic panel   Osteoporosis    Managed with  Testosterone, taking Prolia since  Jan 2021.  DEXA due for repeat in Dec 2022      Other Visit Diagnoses     Need for immunization against influenza       Relevant Orders   Flu Vaccine QUAD High Dose(Fluad) (Completed)   Need for pneumococcal vaccination       Relevant Orders   Pneumococcal conjugate vaccine 20-valent (Prevnar 20) (Completed)        Medications Discontinued During This Encounter  Medication Reason   NON FORMULARY     Follow-up: No follow-ups on file.   [name], MD

## 2021-02-01 ENCOUNTER — Telehealth: Payer: Self-pay | Admitting: Internal Medicine

## 2021-02-01 NOTE — Assessment & Plan Note (Signed)
Confirmed by elastogram.  Reviewed labs from [facility] via Epic .   Hepatic synthetic function remains normal and he is asymptomatic.  Annual screening for hepatocellular CA needed with AFP and [place] has been ordered

## 2021-02-01 NOTE — Telephone Encounter (Signed)
Left a message letting pt know that im not sure who called him this morning but that he did not need to return the call unless he had further questions.

## 2021-02-01 NOTE — Assessment & Plan Note (Addendum)
Managed with  Testosterone, taking Prolia since  Jan 2021.  DEXA due for repeat in Dec 2022

## 2021-02-01 NOTE — Telephone Encounter (Signed)
Patient states he received a call from our office this morning. Informed Patient that I did not see anything new in his chart but would send this to Dr Lupita Dawn CMA to see if there was a call made.   Please advise

## 2021-02-06 DIAGNOSIS — H2512 Age-related nuclear cataract, left eye: Secondary | ICD-10-CM | POA: Diagnosis not present

## 2021-02-14 ENCOUNTER — Encounter: Payer: Self-pay | Admitting: Ophthalmology

## 2021-02-15 ENCOUNTER — Ambulatory Visit: Payer: PPO | Admitting: Gastroenterology

## 2021-02-16 NOTE — Discharge Instructions (Signed)

## 2021-02-20 ENCOUNTER — Other Ambulatory Visit: Payer: Self-pay | Admitting: Internal Medicine

## 2021-02-20 DIAGNOSIS — N529 Male erectile dysfunction, unspecified: Secondary | ICD-10-CM

## 2021-02-21 ENCOUNTER — Encounter
Admission: RE | Disposition: A | Discharge status: Home / Self Care | Payer: Self-pay | Source: Home / Self Care | Attending: Ophthalmology

## 2021-02-21 ENCOUNTER — Ambulatory Visit: Payer: PPO | Admitting: Anesthesiology

## 2021-02-21 ENCOUNTER — Encounter: Payer: Self-pay | Admitting: Ophthalmology

## 2021-02-21 ENCOUNTER — Ambulatory Visit
Admission: RE | Admit: 2021-02-21 | Discharge: 2021-02-21 | Disposition: A | Discharge status: Home / Self Care | Payer: PPO | Attending: Ophthalmology | Admitting: Ophthalmology

## 2021-02-21 ENCOUNTER — Other Ambulatory Visit: Payer: Self-pay

## 2021-02-21 DIAGNOSIS — H25812 Combined forms of age-related cataract, left eye: Secondary | ICD-10-CM | POA: Diagnosis not present

## 2021-02-21 DIAGNOSIS — Z79811 Long term (current) use of aromatase inhibitors: Secondary | ICD-10-CM | POA: Insufficient documentation

## 2021-02-21 DIAGNOSIS — Z96652 Presence of left artificial knee joint: Secondary | ICD-10-CM | POA: Insufficient documentation

## 2021-02-21 DIAGNOSIS — Z87891 Personal history of nicotine dependence: Secondary | ICD-10-CM | POA: Insufficient documentation

## 2021-02-21 DIAGNOSIS — Z79899 Other long term (current) drug therapy: Secondary | ICD-10-CM | POA: Diagnosis not present

## 2021-02-21 DIAGNOSIS — H2512 Age-related nuclear cataract, left eye: Secondary | ICD-10-CM | POA: Insufficient documentation

## 2021-02-21 DIAGNOSIS — Z79890 Hormone replacement therapy (postmenopausal): Secondary | ICD-10-CM | POA: Diagnosis not present

## 2021-02-21 HISTORY — DX: Unspecified cirrhosis of liver: K74.60

## 2021-02-21 HISTORY — PX: CATARACT EXTRACTION W/PHACO: SHX586

## 2021-02-21 SURGERY — PHACOEMULSIFICATION, CATARACT, WITH IOL INSERTION
Anesthesia: Monitor Anesthesia Care | Site: Eye | Laterality: Left

## 2021-02-21 MED ORDER — SIGHTPATH DOSE#1 BSS IO SOLN
INTRAOCULAR | Status: DC | PRN
  Administered 2021-02-21: 1 mL via INTRAMUSCULAR

## 2021-02-21 MED ORDER — TETRACAINE HCL 0.5 % OP SOLN
1.0000 [drp] | OPHTHALMIC | Status: DC | PRN
  Administered 2021-02-21 (×3): 1 [drp] via OPHTHALMIC

## 2021-02-21 MED ORDER — SIGHTPATH DOSE#1 BSS IO SOLN
INTRAOCULAR | Status: DC | PRN
  Administered 2021-02-21: 63 mL via OPHTHALMIC

## 2021-02-21 MED ORDER — SIGHTPATH DOSE#1 BSS IO SOLN
INTRAOCULAR | Status: DC | PRN
  Administered 2021-02-21: 15 mL

## 2021-02-21 MED ORDER — SIGHTPATH DOSE#1 NA CHONDROIT SULF-NA HYALURON 40-17 MG/ML IO SOLN
INTRAOCULAR | Status: DC | PRN
  Administered 2021-02-21: 1 mL via INTRAOCULAR

## 2021-02-21 MED ORDER — BRIMONIDINE TARTRATE-TIMOLOL 0.2-0.5 % OP SOLN
OPHTHALMIC | Status: DC | PRN
  Administered 2021-02-21: 1 [drp] via OPHTHALMIC

## 2021-02-21 MED ORDER — MIDAZOLAM HCL 2 MG/2ML IJ SOLN
INTRAMUSCULAR | Status: DC | PRN
Start: 2021-02-21 — End: 2021-02-21
  Administered 2021-02-21: 1 mg via INTRAVENOUS

## 2021-02-21 MED ORDER — MOXIFLOXACIN HCL 0.5 % OP SOLN
OPHTHALMIC | Status: DC | PRN
  Administered 2021-02-21: 0.2 mL via OPHTHALMIC

## 2021-02-21 MED ORDER — ACETAMINOPHEN 160 MG/5ML PO SOLN: 325.0000 mg | ORAL | Status: DC | PRN

## 2021-02-21 MED ORDER — ACETAMINOPHEN 325 MG PO TABS: 325.0000 mg | ORAL_TABLET | ORAL | Status: DC | PRN

## 2021-02-21 MED ORDER — LACTATED RINGERS IV SOLN: INTRAVENOUS | Status: DC

## 2021-02-21 MED ORDER — ARMC OPHTHALMIC DILATING DROPS
1.0000 "application " | OPHTHALMIC | Status: DC | PRN
  Administered 2021-02-21 (×3): 1 via OPHTHALMIC

## 2021-02-21 MED ORDER — FENTANYL CITRATE (PF) 100 MCG/2ML IJ SOLN
INTRAMUSCULAR | Status: DC | PRN
  Administered 2021-02-21: 50 ug via INTRAVENOUS

## 2021-02-21 SURGICAL SUPPLY — 13 items
CANNULA ANT/CHMB 27GA (MISCELLANEOUS) IMPLANT
GLOVE SURG ENC TEXT LTX SZ8 (GLOVE) ×2 IMPLANT
GLOVE SURG TRIUMPH 8.0 PF LTX (GLOVE) ×2 IMPLANT
GOWN STRL REUS W/ TWL LRG LVL3 (GOWN DISPOSABLE) ×2 IMPLANT
GOWN STRL REUS W/TWL LRG LVL3 (GOWN DISPOSABLE) ×4
LENS IOL TECNIS EYHANCE 20.0 (Intraocular Lens) ×2 IMPLANT
NEEDLE FILTER BLUNT 18X 1/2SAF (NEEDLE) ×1
NEEDLE FILTER BLUNT 18X1 1/2 (NEEDLE) ×1 IMPLANT
PACK EYE AFTER SURG (MISCELLANEOUS) IMPLANT
SYR 3ML LL SCALE MARK (SYRINGE) ×2 IMPLANT
SYR TB 1ML LUER SLIP (SYRINGE) ×2 IMPLANT
WATER STERILE IRR 250ML POUR (IV SOLUTION) ×2 IMPLANT
WIPE NON LINTING 3.25X3.25 (MISCELLANEOUS) ×2 IMPLANT

## 2021-02-21 NOTE — Anesthesia Procedure Notes (Signed)
Procedure Name: MAC Date/Time: 02/21/2021 10:02 AM Performed by: Cameron Ali, CRNA Pre-anesthesia Checklist: Patient identified, Emergency Drugs available, Suction available, Timeout performed and Patient being monitored Patient Re-evaluated:Patient Re-evaluated prior to induction Oxygen Delivery Method: Nasal cannula Placement Confirmation: positive ETCO2

## 2021-02-21 NOTE — Transfer of Care (Signed)
Immediate Anesthesia Transfer of Care Note  Patient: Tony Luna  Procedure(s) Performed: CATARACT EXTRACTION PHACO AND INTRAOCULAR LENS PLACEMENT (IOC) LEFT 4.56 00:34.9 (Left: Eye)  Patient Location: PACU  Anesthesia Type: MAC  Level of Consciousness: awake, alert  and patient cooperative  Airway and Oxygen Therapy: Patient Spontanous Breathing and Patient connected to supplemental oxygen  Post-op Assessment: Post-op Vital signs reviewed, Patient's Cardiovascular Status Stable, Respiratory Function Stable, Patent Airway and No signs of Nausea or vomiting  Post-op Vital Signs: Reviewed and stable  Complications: No notable events documented.

## 2021-02-21 NOTE — H&P (Signed)
[facility]   Primary Care Physician:  [name], MD Ophthalmologist: Dr. George Ina  Pre-Procedure History & Physical: HPI:  Tony Luna is a 80 y.o. male here for cataract surgery.   Past Medical History:  Diagnosis Date   Allergy    Arthritis    Cirrhosis ([place])    Complication of anesthesia    :"need catheter"   Dysrhythmia    ? something told by reg dr  unable to say what it was   Esophageal tear    Hypothyroidism    Pneumonia    hx   Primary osteoarthritis of left knee 06/22/2014   Rotator cuff tear     Past Surgical History:  Procedure Laterality Date   blepharaplasty     CAUTERY OF TURBINATES     COLONOSCOPY WITH PROPOFOL N/A 07/19/2015   Procedure: COLONOSCOPY WITH PROPOFOL;  Surgeon: [name], MD;  Location: [facility] ENDOSCOPY;  Service: Endoscopy;  Laterality: N/A;   HERNIA REPAIR Left    inguinal herniorrhapy   KNEE SURGERY     ? 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 Authorizing Provider  anastrozole (ARIMIDEX) 1 MG tablet Take 1 mg by mouth once a week. 01/04/21  Yes [provider]  Boron 6 MG TABS Take by mouth daily.   Yes [provider]  Cholecalciferol (VITAMIN D-3 PO) Take 5,000 Units by mouth daily. 5,00000 u in the morning, 2,000 u in the evening   Yes [provider]  diphenoxylate-atropine (LOMOTIL) 2.5-0.025 MG tablet Take 1-2 tablets by mouth every 6 (six) hours as needed. 11/04/20  Yes [provider]  MILK THISTLE PO Take 1 tablet by mouth 2 (two) times daily.   Yes [provider]  Multiple Vitamin (MULTIVITAMIN WITH MINERALS) TABS tablet Take 1 tablet by mouth 2 (two) times  daily.   Yes [provider]  NON Lanette [place] Thought Takes 2 capsule bid.   Yes [provider]  NP THYROID 60 MG tablet TAKE ONE TABLET (60 MG) BY MOUTH EVERY DAY 02/20/21  Yes [name], MD  OVER THE COUNTER MEDICATION Take 4 sprays by mouth at bedtime. Secretropin oral spray - growth hormone   Yes [provider]  OVER THE COUNTER MEDICATION Take 1 capsule by mouth 3 (three) times daily. Instaflex   Yes [provider]  OVER THE COUNTER MEDICATION Take 1,000 mg by mouth daily. Turmeric with Meriva   Yes [provider]  OVER THE COUNTER MEDICATION Take 3,000 mg by mouth daily. Omega Cure   Yes [provider]  OVER THE COUNTER MEDICATION Take 250 mg by mouth daily. Bio Magnesium Citrate   Yes [provider]  OVER THE COUNTER MEDICATION Take 20,000 mcg by mouth daily. Vitamin B12-methylfolate   Yes [provider]  OVER THE COUNTER MEDICATION Take 1 tablet by mouth daily. Pure adrenal 400   Yes [provider]  OVER THE COUNTER MEDICATION Take 800 mcg by mouth daily. Active B Folate   Yes [provider]  OVER THE COUNTER MEDICATION 2 tablets daily. VitaPrime   Yes [provider]  OVER THE COUNTER MEDICATION 1,000 mg daily. Super Bio-C buffered   Yes [provider]  OVER THE COUNTER MEDICATION 1,000 mg daily. L'Arginine   Yes [provider]  OVER THE COUNTER MEDICATION 2 capsules 2 (two) times daily. SLF Forte   Yes [provider]  OVER THE COUNTER MEDICATION 1,000 mg. Myomin   Yes [provider]  Saw Palmetto 160 MG TABS  05/30/19  Yes [provider]  sildenafil (VIAGRA) 50 MG tablet USE AS DIRECTED 10/28/20  Yes [name], MD  tadalafil (CIALIS) 20 MG tablet TAKE 1 TABLET BY MOUTH DAILY AS NEEDED FOR ED 04/18/20  Yes [name], MD  tamsulosin (FLOMAX) 0.4 MG CAPS capsule TAKE 1 CAPSULE BY MOUTH ONCE DAILY 12/13/20  Yes [name], MD  TAURINE PO Take 1,000 mg by mouth daily.    Yes [provider]  Testosterone 75 MG PLLT by Implant route.   Yes [provider]  zolpidem (AMBIEN) 5 MG tablet TAKE 1/2-1 TABLET BY MOUTH AT BEDTIME ASNEEDED FOR SLEEP 12/20/20  Yes [name], MD  EPINEPHrine 0.3 mg/0.3 mL IJ SOAJ injection Inject into the muscle as directed. 12/07/20   [provider]    Allergies as of 01/19/2021   (No Known Allergies)    Family History  Problem Relation Age of Onset   Alcohol abuse Mother    Diabetes Mother    Mental retardation Mother     Social History   Socioeconomic History   Marital status: Married    Spouse name: Not on file   Number of children: 2   Years of education: Not on file   Highest education level: Not on file  Occupational History   Occupation: textiles  Tobacco Use   Smoking status: Former    Packs/day: 1.00    Years: 15.00    Pack years: 15.00    Types: Cigarettes    Quit date: 06/15/1983    Years since quitting: 37.7   Smokeless tobacco: Current    Types: Chew  Vaping Use   Vaping Use: Never used  Substance and Sexual Activity   Alcohol use: Yes    Alcohol/week: 10.0 standard drinks    Types: 10 Glasses of wine per week   Drug use: No   Sexual activity: Not Currently  Other Topics Concern   Not on file  Social History Narrative   [employer] graduate   [place]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   Very active   Walks 1 hour daily   Social Determinants of Health   Financial Resource Strain: Low Risk    Difficulty of Paying Living Expenses: Not hard at all  Food Insecurity: No Food Insecurity   Worried About Charity fundraiser in the Last Year: Never true   Arboriculturist in the Last Year: Never true  Transportation Needs: No Transportation Needs   Lack of Transportation (Medical): No   Lack of Transportation (Non-Medical): No  Physical Activity: Sufficiently Active   Days of Exercise per Week: 7 days   Minutes of Exercise per Session: 60 min   Stress: No Stress Concern Present   Feeling of Stress : Not at all  Social Connections: Socially Integrated   Frequency of Communication with Friends and Family: More than three times a week   Frequency of Social Gatherings with Friends and Family: More than three times a week   Attends Religious Services: 1 to 4 times per year   Active Member of  Clubs or Organizations: Yes   Attends Archivist Meetings: 1 to 4 times per year   Marital Status: Married  Human resources officer Violence: Not At Risk   Fear of Current or Ex-Partner: No   Emotionally Abused: No   Physically Abused: No   Sexually Abused: No    Review of Systems: See HPI, otherwise negative ROS  Physical Exam: BP (!) 162/93   Pulse 69   Temp 97.7 F (36.5 C) (Temporal)   Ht 6' (1.829 m)   Wt 75.8 kg   SpO2 97%   BMI 22.65 kg/m  General:   Alert, cooperative in NAD Head:  Normocephalic and atraumatic. Respiratory:  Normal work of breathing. Cardiovascular:  RRR  Impression/Plan: Tony Luna is here for cataract surgery.  Risks, benefits, limitations, and alternatives regarding cataract surgery have been reviewed with the patient.  Questions have been answered.  All parties agreeable.   Birder Robson, MD  02/21/2021, 9:48 AM

## 2021-02-21 NOTE — Op Note (Signed)
PREOPERATIVE DIAGNOSIS:  Nuclear sclerotic cataract of the left eye.   POSTOPERATIVE DIAGNOSIS:  Nuclear sclerotic cataract of the left eye.   OPERATIVE PROCEDURE:ORPROCALL@   SURGEON:  Birder Robson, MD.   ANESTHESIA:  Anesthesiologist: Rochel Brome, MD CRNA: Cameron Ali, CRNA  1.      Managed anesthesia care. 2.     0.77ml of Shugarcaine was instilled following the paracentesis   COMPLICATIONS:  None.   TECHNIQUE:   Stop and chop   DESCRIPTION OF PROCEDURE:  The patient was examined and consented in the preoperative holding area where the aforementioned topical anesthesia was applied to the left eye and then brought back to the Operating Room where the left eye was prepped and draped in the usual sterile ophthalmic fashion and a lid speculum was placed. A paracentesis was created with the side port blade and the anterior chamber was filled with viscoelastic. A near clear corneal incision was performed with the steel keratome. A continuous curvilinear capsulorrhexis was performed with a cystotome followed by the capsulorrhexis forceps. Hydrodissection and hydrodelineation were carried out with BSS on a blunt cannula. The lens was removed in a stop and chop  technique and the remaining cortical material was removed with the irrigation-aspiration handpiece. The capsular bag was inflated with viscoelastic and the Technis [id number] lens was placed in the capsular bag without complication. The remaining viscoelastic was removed from the eye with the irrigation-aspiration handpiece. The wounds were hydrated. The anterior chamber was flushed with BSS and the eye was inflated to physiologic pressure. 0.59ml Vigamox was placed in the anterior chamber. The wounds were found to be water tight. The eye was dressed with Combigan. The patient was given protective glasses to wear throughout the day and a shield with which to sleep tonight. The patient was also given drops with which to begin a drop regimen today  and will follow-up with me in one day. Implant Name Type Inv. Item Serial No. Manufacturer Lot No. LRB No. Used Action  LENS IOL TECNIS EYHANCE 20.0 - [id number] Intraocular Lens LENS IOL TECNIS EYHANCE 20.0 [id number] JOHNSON   Left 1 Implanted    Procedure(s): CATARACT EXTRACTION PHACO AND INTRAOCULAR LENS PLACEMENT (IOC) LEFT 4.56 00:34.9 (Left)  Electronically signed: Birder Robson 02/21/2021 10:19 AM

## 2021-02-21 NOTE — Telephone Encounter (Signed)
He has requested refill of 2 different meds for ED (viagra and Cialis ).  Confirm that he is NOT using them on the same day

## 2021-02-21 NOTE — Anesthesia Preprocedure Evaluation (Signed)
Anesthesia Evaluation  Patient identified by MRN, date of birth, ID band Patient awake    Reviewed: Allergy & Precautions, H&P , NPO status , Patient's Chart, lab work & pertinent test results, reviewed documented beta blocker date and time   Airway Mallampati: II  TM Distance: >3 FB Neck ROM: full    Dental no notable dental hx.    Pulmonary former smoker,    Pulmonary exam normal breath sounds clear to auscultation       Cardiovascular Exercise Tolerance: Good hypertension, Normal cardiovascular exam Rhythm:regular Rate:Normal     Neuro/Psych negative neurological ROS  negative psych ROS   GI/Hepatic negative GI ROS, (+) Cirrhosis       ,   Endo/Other  negative endocrine ROSHypothyroidism   Renal/GU negative Renal ROS  negative genitourinary   Musculoskeletal   Abdominal   Peds  Hematology negative hematology ROS (+)   Anesthesia Other Findings   Reproductive/Obstetrics negative OB ROS                             Anesthesia Physical Anesthesia Plan  ASA: 3  Anesthesia Plan: MAC   Post-op Pain Management:    Induction:   PONV Risk Score and Plan:   Airway Management Planned:   Additional Equipment:   Intra-op Plan:   Post-operative Plan:   Informed Consent: I have reviewed the patients History and Physical, chart, labs and discussed the procedure including the risks, benefits and alternatives for the proposed anesthesia with the patient or authorized representative who has indicated his/her understanding and acceptance.     Dental Advisory Given  Plan Discussed with: CRNA and Anesthesiologist  Anesthesia Plan Comments:         Anesthesia Quick Evaluation

## 2021-02-21 NOTE — Anesthesia Postprocedure Evaluation (Signed)
Anesthesia Post Note  Patient: Tony Luna  Procedure(s) Performed: CATARACT EXTRACTION PHACO AND INTRAOCULAR LENS PLACEMENT (IOC) LEFT 4.56 00:34.9 (Left: Eye)     Patient location during evaluation: PACU Anesthesia Type: MAC Level of consciousness: awake and alert Pain management: pain level controlled Vital Signs Assessment: post-procedure vital signs reviewed and stable Respiratory status: spontaneous breathing, nonlabored ventilation, respiratory function stable and patient connected to nasal cannula oxygen Cardiovascular status: stable and blood pressure returned to baseline Postop Assessment: no apparent nausea or vomiting Anesthetic complications: no   No notable events documented.  Trecia Rogers

## 2021-02-22 ENCOUNTER — Encounter: Payer: Self-pay | Admitting: Gastroenterology

## 2021-02-22 ENCOUNTER — Other Ambulatory Visit: Payer: Self-pay

## 2021-02-22 ENCOUNTER — Ambulatory Visit: Payer: PPO | Admitting: Gastroenterology

## 2021-02-22 VITALS — BP 155/81 | HR 69 | Temp 97.9°F | Ht 72.0 in | Wt 169.4 lb

## 2021-02-22 DIAGNOSIS — D126 Benign neoplasm of colon, unspecified: Secondary | ICD-10-CM

## 2021-02-22 MED ORDER — CLENPIQ 10-3.5-12 MG-GM -GM/160ML PO SOLN: 320.0000 mL | ORAL | 0 refills | Status: DC

## 2021-02-22 NOTE — H&P (View-Only) (Signed)
Gastroenterology Consultation  Referring Provider:     [name], MD Primary Care Physician:  [name], MD Primary Gastroenterologist:  Dr. Allen Norris     Reason for Consultation:     History of colon polyps        HPI:   Tony Luna is a 80 y.o. y/o male referred for consultation & management of history of colon polyps by Dr. Derrel Nip, Aris Everts, MD. this patient comes in today after having a colonoscopy 5 years ago.  At that time the colonoscopy the patient was found to have a adenomatous polyp that was removed.  It was recommended that he have a repeat colonoscopy in 5 years and now comes in today to discuss whether or not he should follow-up with a repeat colonoscopy.  The patient has had no abdominal pain nausea vomiting fevers chills black stools or bloody stools.  The patient states that he walks frequently and usually miles at a time.  The patient just came back from a 3-day hunting trip.  He also goes to the gym 3 times a week.  He reports that he has an general good health and recently had cataract surgery.  Past Medical History:  Diagnosis Date   Allergy    Arthritis    Cirrhosis ([place])    Complication of anesthesia    :"need catheter"   Dysrhythmia    ? something told by reg dr  unable to say what it was   Esophageal tear    Hypothyroidism    Pneumonia    hx   Primary osteoarthritis of left knee 06/22/2014   Rotator cuff tear     Past Surgical History:  Procedure Laterality Date   blepharaplasty     CATARACT EXTRACTION W/PHACO Left 02/21/2021   Procedure: CATARACT EXTRACTION PHACO AND INTRAOCULAR LENS PLACEMENT ([place]) LEFT 4.56 00:34.9;  Surgeon: Birder Robson, MD;  Location: [place];  Service: Ophthalmology;  Laterality: Left;   CAUTERY OF TURBINATES     COLONOSCOPY WITH PROPOFOL N/A 07/19/2015   Procedure: COLONOSCOPY WITH PROPOFOL;  Surgeon: [name], MD;  Location: [facility];  Service: Endoscopy;  Laterality: N/A;   HERNIA REPAIR Left     inguinal herniorrhapy   KNEE SURGERY     ? loose body/ chondral defect 45yrs   ROTATOR CUFF REPAIR     left   SPINE SURGERY     TONSILLECTOMY     TOTAL KNEE ARTHROPLASTY Left 06/22/2014   Procedure: TOTAL KNEE ARTHROPLASTY;  Surgeon: [name], MD;  Location: [place];  Service: Orthopedics;  Laterality: Left;    Prior to Admission medications   Medication Sig Start Date End Date Taking? Authorizing Provider  anastrozole (ARIMIDEX) 1 MG tablet Take 1 mg by mouth once a week. 01/04/21  Yes [provider]  Boron 6 MG TABS Take by mouth daily.   Yes [provider]  Cholecalciferol (VITAMIN D-3 PO) Take 5,000 Units by mouth daily. 5,00000 u in the morning, 2,000 u in the evening   Yes [provider]  diphenoxylate-atropine (LOMOTIL) 2.5-0.025 MG tablet Take 1-2 tablets by mouth every 6 (six) hours as needed. 11/04/20  Yes [provider]  EPINEPHrine 0.3 mg/0.3 mL IJ SOAJ injection Inject into the muscle as directed. 12/07/20  Yes [provider]  MILK THISTLE PO Take 1 tablet by mouth 2 (two) times daily.   Yes [provider]  Multiple Vitamin (MULTIVITAMIN WITH MINERALS) TABS tablet Take 1 tablet  by mouth 2 (two) times daily.   Yes [provider]  NON Lanette [place] Thought Takes 2 capsule bid.   Yes [provider]  NP THYROID 60 MG tablet TAKE ONE TABLET (60 MG) BY MOUTH EVERY DAY 02/20/21  Yes [name], MD  OVER THE COUNTER MEDICATION Take 4 sprays by mouth at bedtime. Secretropin oral spray - growth hormone   Yes [provider]  OVER THE COUNTER MEDICATION Take 1 capsule by mouth 3 (three) times daily. Instaflex   Yes [provider]  OVER THE COUNTER MEDICATION Take 1,000 mg by mouth daily. Turmeric with Meriva   Yes [provider]  OVER THE COUNTER MEDICATION Take 3,000 mg by mouth daily. Omega Cure   Yes [provider]  OVER THE COUNTER MEDICATION Take 250 mg by  mouth daily. Bio Magnesium Citrate   Yes [provider]  OVER THE COUNTER MEDICATION Take 20,000 mcg by mouth daily. Vitamin B12-methylfolate   Yes [provider]  OVER THE COUNTER MEDICATION Take 1 tablet by mouth daily. Pure adrenal 400   Yes [provider]  OVER THE COUNTER MEDICATION Take 800 mcg by mouth daily. Active B Folate   Yes [provider]  OVER THE COUNTER MEDICATION 2 tablets daily. VitaPrime   Yes [provider]  OVER THE COUNTER MEDICATION 1,000 mg daily. Super Bio-C buffered   Yes [provider]  OVER THE COUNTER MEDICATION 1,000 mg daily. L'Arginine   Yes [provider]  OVER THE COUNTER MEDICATION 2 capsules 2 (two) times daily. SLF Forte   Yes [provider]  OVER THE COUNTER MEDICATION 1,000 mg. Myomin   Yes [provider]  Saw Palmetto 160 MG TABS  05/30/19  Yes [provider]  sildenafil (VIAGRA) 50 MG tablet USE AS DIRECTED 10/28/20  Yes [name], MD  tadalafil (CIALIS) 20 MG tablet TAKE 1 TABLET BY MOUTH DAILY AS NEEDED FOR ED 04/18/20  Yes [name], MD  tamsulosin (FLOMAX) 0.4 MG CAPS capsule TAKE 1 CAPSULE BY MOUTH ONCE DAILY 12/13/20  Yes [name], MD  TAURINE PO Take 1,000 mg by mouth daily.    Yes [provider]  Testosterone 75 MG PLLT by Implant route.   Yes [provider]  zolpidem (AMBIEN) 5 MG tablet TAKE 1/2-1 TABLET BY MOUTH AT BEDTIME ASNEEDED FOR SLEEP 12/20/20  Yes [name], MD  Sod Picosulfate-Mag Ox-Cit Acd (CLENPIQ) 10-3.5-12 MG-GM -GM/160ML SOLN Take 320 mLs by mouth as directed. 02/22/21   [name], MD    Family History  Problem Relation Age of Onset   Alcohol abuse Mother    Diabetes Mother    Mental retardation Mother      Social History   Tobacco Use   Smoking status: Former    Packs/day: 1.00    Years: 15.00    Pack years: 15.00    Types: Cigarettes    Quit date: 06/15/1983    Years since  quitting: 37.7   Smokeless tobacco: Current    Types: Chew  Vaping Use   Vaping Use: Never used  Substance Use Topics   Alcohol use: Yes    Alcohol/week: 10.0 standard drinks    Types: 10 Glasses of wine per week   Drug use: No    Allergies as of 02/22/2021   (No Known Allergies)    Review of Systems:    All systems reviewed and negative except where noted in  HPI.   Physical Exam:  BP (!) 155/81 (BP Location: Left Arm, Patient Position: Sitting, Cuff Size: Large)   Pulse 69   Temp 97.9 F (36.6 C) (Temporal)   Ht 6' (1.829 m)   Wt 169 lb 6.4 oz (76.8 kg)   BMI 22.97 kg/m  No LMP for male patient. General:   Alert,  Well-developed, well-nourished, pleasant and cooperative in NAD Head:  Normocephalic and atraumatic. Eyes:  Sclera clear, no icterus.   Conjunctiva pink. Ears:  Normal auditory acuity. Neck:  Supple; no masses or thyromegaly. Lungs:  Respirations even and unlabored.  Clear throughout to auscultation.   No wheezes, crackles, or rhonchi. No acute distress. Heart:  Regular rate and rhythm; no murmurs, clicks, rubs, or gallops. Abdomen:  Normal bowel sounds.  No bruits.  Soft, non-tender and non-distended without masses, hepatosplenomegaly or hernias noted.  No guarding or rebound tenderness.  Negative Carnett sign.   Rectal:  Deferred.  Pulses:  Normal pulses noted. Extremities:  No clubbing or edema.  No cyanosis. Neurologic:  Alert and oriented x3;  grossly normal neurologically. Skin:  Intact without significant lesions or rashes.  No jaundice. Lymph Nodes:  No significant cervical adenopathy. Psych:  Alert and cooperative. Normal mood and affect.  Imaging Studies: No results found.  Assessment and Plan:   LEMONTE AL is a 80 y.o. y/o male who comes in today with a history of adenomatous colon polyps.  The patient has been explained the risks and benefits of proceeding with a repeat colonoscopy.  The patient has been given the option to have the  procedure or not and the patient reports that he would like to be proactive and make sure that there is nothing going on.  The patient will be set up for colonoscopy due to his history of colon polyps.  The patient has been explained the plan and agrees with it.    [name], MD. Marval Regal    Note: This dictation was prepared with Dragon dictation along with smaller phrase technology. Any transcriptional errors that result from this process are unintentional.

## 2021-02-22 NOTE — Progress Notes (Signed)
Gastroenterology Consultation  Referring Provider:     [name], MD Primary Care Physician:  [name], MD Primary Gastroenterologist:  Dr. Allen Norris     Reason for Consultation:     History of colon polyps        HPI:   Tony Luna is a 80 y.o. y/o male referred for consultation & management of history of colon polyps by Dr. Derrel Nip, Aris Everts, MD. this patient comes in today after having a colonoscopy 5 years ago.  At that time the colonoscopy the patient was found to have a adenomatous polyp that was removed.  It was recommended that he have a repeat colonoscopy in 5 years and now comes in today to discuss whether or not he should follow-up with a repeat colonoscopy.  The patient has had no abdominal pain nausea vomiting fevers chills black stools or bloody stools.  The patient states that he walks frequently and usually miles at a time.  The patient just came back from a 3-day hunting trip.  He also goes to the gym 3 times a week.  He reports that he has an general good health and recently had cataract surgery.  Past Medical History:  Diagnosis Date   Allergy    Arthritis    Cirrhosis ([place])    Complication of anesthesia    :"need catheter"   Dysrhythmia    ? something told by reg dr  unable to say what it was   Esophageal tear    Hypothyroidism    Pneumonia    hx   Primary osteoarthritis of left knee 06/22/2014   Rotator cuff tear     Past Surgical History:  Procedure Laterality Date   blepharaplasty     CATARACT EXTRACTION W/PHACO Left 02/21/2021   Procedure: CATARACT EXTRACTION PHACO AND INTRAOCULAR LENS PLACEMENT ([place]) LEFT 4.56 00:34.9;  Surgeon: Birder Robson, MD;  Location: [place];  Service: Ophthalmology;  Laterality: Left;   CAUTERY OF TURBINATES     COLONOSCOPY WITH PROPOFOL N/A 07/19/2015   Procedure: COLONOSCOPY WITH PROPOFOL;  Surgeon: [name], MD;  Location: [facility];  Service: Endoscopy;  Laterality: N/A;   HERNIA REPAIR Left     inguinal herniorrhapy   KNEE SURGERY     ? 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   Procedure: TOTAL KNEE ARTHROPLASTY;  Surgeon: [name], MD;  Location: [place];  Service: Orthopedics;  Laterality: Left;    Prior to Admission medications   Medication Sig Start Date End Date Taking? Authorizing Provider  anastrozole (ARIMIDEX) 1 MG tablet Take 1 mg by mouth once a week. 01/04/21  Yes [provider]  Boron 6 MG TABS Take by mouth daily.   Yes [provider]  Cholecalciferol (VITAMIN D-3 PO) Take 5,000 Units by mouth daily. 5,00000 u in the morning, 2,000 u in the evening   Yes [provider]  diphenoxylate-atropine (LOMOTIL) 2.5-0.025 MG tablet Take 1-2 tablets by mouth every 6 (six) hours as needed. 11/04/20  Yes [provider]  EPINEPHrine 0.3 mg/0.3 mL IJ SOAJ injection Inject into the muscle as directed. 12/07/20  Yes [provider]  MILK THISTLE PO Take 1 tablet by mouth 2 (two) times daily.   Yes [provider]  Multiple Vitamin (MULTIVITAMIN WITH MINERALS) TABS tablet Take 1 tablet  by mouth 2 (two) times daily.   Yes [provider]  NON Lanette [place] Thought Takes 2 capsule bid.   Yes [provider]  NP THYROID 60 MG tablet TAKE ONE TABLET (60 MG) BY MOUTH EVERY DAY 02/20/21  Yes [name], MD  OVER THE COUNTER MEDICATION Take 4 sprays by mouth at bedtime. Secretropin oral spray - growth hormone   Yes [provider]  OVER THE COUNTER MEDICATION Take 1 capsule by mouth 3 (three) times daily. Instaflex   Yes [provider]  OVER THE COUNTER MEDICATION Take 1,000 mg by mouth daily. Turmeric with Meriva   Yes [provider]  OVER THE COUNTER MEDICATION Take 3,000 mg by mouth daily. Omega Cure   Yes [provider]  OVER THE COUNTER MEDICATION Take 250 mg by  mouth daily. Bio Magnesium Citrate   Yes [provider]  OVER THE COUNTER MEDICATION Take 20,000 mcg by mouth daily. Vitamin B12-methylfolate   Yes [provider]  OVER THE COUNTER MEDICATION Take 1 tablet by mouth daily. Pure adrenal 400   Yes [provider]  OVER THE COUNTER MEDICATION Take 800 mcg by mouth daily. Active B Folate   Yes [provider]  OVER THE COUNTER MEDICATION 2 tablets daily. VitaPrime   Yes [provider]  OVER THE COUNTER MEDICATION 1,000 mg daily. Super Bio-C buffered   Yes [provider]  OVER THE COUNTER MEDICATION 1,000 mg daily. L'Arginine   Yes [provider]  OVER THE COUNTER MEDICATION 2 capsules 2 (two) times daily. SLF Forte   Yes [provider]  OVER THE COUNTER MEDICATION 1,000 mg. Myomin   Yes [provider]  Saw Palmetto 160 MG TABS  05/30/19  Yes [provider]  sildenafil (VIAGRA) 50 MG tablet USE AS DIRECTED 10/28/20  Yes [name], MD  tadalafil (CIALIS) 20 MG tablet TAKE 1 TABLET BY MOUTH DAILY AS NEEDED FOR ED 04/18/20  Yes [name], MD  tamsulosin (FLOMAX) 0.4 MG CAPS capsule TAKE 1 CAPSULE BY MOUTH ONCE DAILY 12/13/20  Yes [name], MD  TAURINE PO Take 1,000 mg by mouth daily.    Yes [provider]  Testosterone 75 MG PLLT by Implant route.   Yes [provider]  zolpidem (AMBIEN) 5 MG tablet TAKE 1/2-1 TABLET BY MOUTH AT BEDTIME ASNEEDED FOR SLEEP 12/20/20  Yes [name], MD  Sod Picosulfate-Mag Ox-Cit Acd (CLENPIQ) 10-3.5-12 MG-GM -GM/160ML SOLN Take 320 mLs by mouth as directed. 02/22/21   [name], MD    Family History  Problem Relation Age of Onset   Alcohol abuse Mother    Diabetes Mother    Mental retardation Mother      Social History   Tobacco Use   Smoking status: Former    Packs/day: 1.00    Years: 15.00    Pack years: 15.00    Types: Cigarettes    Quit date: 06/15/1983    Years since  quitting: 37.7   Smokeless tobacco: Current    Types: Chew  Vaping Use   Vaping Use: Never used  Substance Use Topics   Alcohol use: Yes    Alcohol/week: 10.0 standard drinks    Types: 10 Glasses of wine per week   Drug use: No    Allergies as of 02/22/2021   (No Known Allergies)    Review of Systems:    All systems reviewed and negative except where noted in  HPI.   Physical Exam:  BP (!) 155/81 (BP Location: Left Arm, Patient Position: Sitting, Cuff Size: Large)   Pulse 69   Temp 97.9 F (36.6 C) (Temporal)   Ht 6' (1.829 m)   Wt 169 lb 6.4 oz (76.8 kg)   BMI 22.97 kg/m  No LMP for male patient. General:   Alert,  Well-developed, well-nourished, pleasant and cooperative in NAD Head:  Normocephalic and atraumatic. Eyes:  Sclera clear, no icterus.   Conjunctiva pink. Ears:  Normal auditory acuity. Neck:  Supple; no masses or thyromegaly. Lungs:  Respirations even and unlabored.  Clear throughout to auscultation.   No wheezes, crackles, or rhonchi. No acute distress. Heart:  Regular rate and rhythm; no murmurs, clicks, rubs, or gallops. Abdomen:  Normal bowel sounds.  No bruits.  Soft, non-tender and non-distended without masses, hepatosplenomegaly or hernias noted.  No guarding or rebound tenderness.  Negative Carnett sign.   Rectal:  Deferred.  Pulses:  Normal pulses noted. Extremities:  No clubbing or edema.  No cyanosis. Neurologic:  Alert and oriented x3;  grossly normal neurologically. Skin:  Intact without significant lesions or rashes.  No jaundice. Lymph Nodes:  No significant cervical adenopathy. Psych:  Alert and cooperative. Normal mood and affect.  Imaging Studies: No results found.  Assessment and Plan:   MACEO HERNAN is a 80 y.o. y/o male who comes in today with a history of adenomatous colon polyps.  The patient has been explained the risks and benefits of proceeding with a repeat colonoscopy.  The patient has been given the option to have the  procedure or not and the patient reports that he would like to be proactive and make sure that there is nothing going on.  The patient will be set up for colonoscopy due to his history of colon polyps.  The patient has been explained the plan and agrees with it.    [name], MD. Marval Regal    Note: This dictation was prepared with Dragon dictation along with smaller phrase technology. Any transcriptional errors that result from this process are unintentional.

## 2021-02-23 NOTE — Telephone Encounter (Signed)
Pt stated that sometimes he will take the Cialis in morning and the Viagra in the evening.

## 2021-02-24 ENCOUNTER — Other Ambulatory Visit: Payer: Self-pay

## 2021-02-24 DIAGNOSIS — Z03818 Encounter for observation for suspected exposure to other biological agents ruled out: Secondary | ICD-10-CM | POA: Diagnosis not present

## 2021-02-24 DIAGNOSIS — D126 Benign neoplasm of colon, unspecified: Secondary | ICD-10-CM

## 2021-02-24 DIAGNOSIS — Z20822 Contact with and (suspected) exposure to covid-19: Secondary | ICD-10-CM | POA: Diagnosis not present

## 2021-03-15 ENCOUNTER — Other Ambulatory Visit: Payer: Self-pay | Admitting: Internal Medicine

## 2021-03-17 DIAGNOSIS — L538 Other specified erythematous conditions: Secondary | ICD-10-CM | POA: Diagnosis not present

## 2021-03-17 DIAGNOSIS — D2261 Melanocytic nevi of right upper limb, including shoulder: Secondary | ICD-10-CM | POA: Diagnosis not present

## 2021-03-17 DIAGNOSIS — D2271 Melanocytic nevi of right lower limb, including hip: Secondary | ICD-10-CM | POA: Diagnosis not present

## 2021-03-17 DIAGNOSIS — Z872 Personal history of diseases of the skin and subcutaneous tissue: Secondary | ICD-10-CM | POA: Diagnosis not present

## 2021-03-17 DIAGNOSIS — L820 Inflamed seborrheic keratosis: Secondary | ICD-10-CM | POA: Diagnosis not present

## 2021-03-17 DIAGNOSIS — D225 Melanocytic nevi of trunk: Secondary | ICD-10-CM | POA: Diagnosis not present

## 2021-03-17 DIAGNOSIS — Z85828 Personal history of other malignant neoplasm of skin: Secondary | ICD-10-CM | POA: Diagnosis not present

## 2021-03-21 ENCOUNTER — Encounter: Payer: Self-pay | Admitting: Gastroenterology

## 2021-03-21 ENCOUNTER — Ambulatory Visit: Payer: PPO | Admitting: Anesthesiology

## 2021-03-21 ENCOUNTER — Encounter
Admission: RE | Disposition: A | Discharge status: Home / Self Care | Payer: Self-pay | Source: Home / Self Care | Attending: Gastroenterology

## 2021-03-21 ENCOUNTER — Ambulatory Visit
Admission: RE | Admit: 2021-03-21 | Discharge: 2021-03-21 | Disposition: A | Discharge status: Home / Self Care | Payer: PPO | Attending: Gastroenterology | Admitting: Gastroenterology

## 2021-03-21 DIAGNOSIS — K5730 Diverticulosis of large intestine without perforation or abscess without bleeding: Secondary | ICD-10-CM | POA: Diagnosis not present

## 2021-03-21 DIAGNOSIS — D123 Benign neoplasm of transverse colon: Secondary | ICD-10-CM | POA: Insufficient documentation

## 2021-03-21 DIAGNOSIS — D126 Benign neoplasm of colon, unspecified: Secondary | ICD-10-CM

## 2021-03-21 DIAGNOSIS — K641 Second degree hemorrhoids: Secondary | ICD-10-CM | POA: Insufficient documentation

## 2021-03-21 DIAGNOSIS — Z1211 Encounter for screening for malignant neoplasm of colon: Secondary | ICD-10-CM | POA: Insufficient documentation

## 2021-03-21 DIAGNOSIS — K635 Polyp of colon: Secondary | ICD-10-CM

## 2021-03-21 DIAGNOSIS — Z87891 Personal history of nicotine dependence: Secondary | ICD-10-CM | POA: Insufficient documentation

## 2021-03-21 DIAGNOSIS — Z860100 Personal history of colon polyps, unspecified: Secondary | ICD-10-CM

## 2021-03-21 DIAGNOSIS — E039 Hypothyroidism, unspecified: Secondary | ICD-10-CM | POA: Diagnosis not present

## 2021-03-21 DIAGNOSIS — D125 Benign neoplasm of sigmoid colon: Secondary | ICD-10-CM | POA: Insufficient documentation

## 2021-03-21 HISTORY — PX: COLONOSCOPY WITH PROPOFOL: SHX5780

## 2021-03-21 SURGERY — COLONOSCOPY WITH PROPOFOL
Anesthesia: General

## 2021-03-21 MED ORDER — SODIUM CHLORIDE 0.9 % IV SOLN: INTRAVENOUS | Status: DC

## 2021-03-21 MED ORDER — FENTANYL CITRATE (PF) 100 MCG/2ML IJ SOLN
INTRAMUSCULAR | Status: DC | PRN
  Administered 2021-03-21 (×4): 25 ug via INTRAVENOUS

## 2021-03-21 MED ORDER — LIDOCAINE HCL (CARDIAC) PF 100 MG/5ML IV SOSY
PREFILLED_SYRINGE | INTRAVENOUS | Status: DC | PRN
  Administered 2021-03-21: 50 mg via INTRAVENOUS

## 2021-03-21 MED ORDER — PROPOFOL 500 MG/50ML IV EMUL
INTRAVENOUS | Status: DC | PRN
  Administered 2021-03-21: 50 ug/kg/min via INTRAVENOUS

## 2021-03-21 MED ORDER — PROPOFOL 10 MG/ML IV BOLUS
INTRAVENOUS | Status: DC | PRN
  Administered 2021-03-21: 30 mg via INTRAVENOUS
  Administered 2021-03-21: 20 mg via INTRAVENOUS

## 2021-03-21 NOTE — Op Note (Signed)
[facility] Gastroenterology Patient Name: Tony Luna Procedure Date: 03/21/2021 10:05 AM MRN: [id number] Account #: [id number] Date of Birth: 1941-01-21 Admit Type: Outpatient Age: 80 Room: [facility] ENDO ROOM 4 Gender: Male Note Status: Finalized Instrument Name: Jasper Riling [id number] Procedure:             Colonoscopy Indications:           High risk colon cancer surveillance: Personal history                         of colonic polyps Providers:             [name] MD, MD Referring MD:          Deborra Medina, MD (Referring MD) Medicines:             Propofol per Anesthesia Complications:         No immediate complications. Procedure:             Pre-Anesthesia Assessment:                        - Prior to the procedure, a History and Physical was                         performed, and patient medications and allergies were                         reviewed. The patient's tolerance of previous                         anesthesia was also reviewed. The risks and benefits                         of the procedure and the sedation options and risks                         were discussed with the patient. All questions were                         answered, and informed consent was obtained. Prior                         Anticoagulants: The patient has taken no previous                         anticoagulant or antiplatelet agents. ASA Grade                         Assessment: II - A patient with mild systemic disease.                         After reviewing the risks and benefits, the patient                         was deemed in satisfactory condition to undergo the                         procedure.  After obtaining informed consent, the colonoscope was                         passed under direct vision. Throughout the procedure,                         the patient's blood pressure, pulse, and oxygen                         saturations were  monitored continuously. The                         Colonoscope was introduced through the anus and                         advanced to the the cecum, identified by appendiceal                         orifice and ileocecal valve. The colonoscopy was                         performed without difficulty. The patient tolerated                         the procedure well. The quality of the bowel                         preparation was excellent. Findings:      The perianal and digital rectal examinations were normal.      A 12 mm polyp was found in the transverse colon. The polyp was sessile.       Preparations were made for mucosal resection. Chromoscopy with indigo       carmine was done to mark the borders of the lesion. Saline with indigo       carmine was injected to raise the lesion. Snare mucosal resection was       performed. Resection and retrieval were complete. To close a defect       after polypectomy, two hemostatic clips were successfully placed (MR       conditional). There was no bleeding at the end of the procedure.      Multiple small-mouthed diverticula were found in the sigmoid colon,       descending colon and transverse colon.      A 6 mm polyp was found in the sigmoid colon. The polyp was sessile. The       polyp was removed with a cold snare. Resection and retrieval were       complete.      Four sessile polyps were found in the transverse colon. The polyps were       4 to 6 mm in size. These polyps were removed with a cold snare.       Resection and retrieval were complete.      Non-bleeding internal hemorrhoids were found during retroflexion. The       hemorrhoids were Grade II (internal hemorrhoids that prolapse but reduce       spontaneously). Impression:            - One 12 mm polyp in the transverse colon, removed  with mucosal resection. Resected and retrieved. Clips                         (MR conditional) were placed.                         - Diverticulosis in the sigmoid colon, in the                         descending colon and in the transverse colon.                        - One 6 mm polyp in the sigmoid colon, removed with a                         cold snare. Resected and retrieved.                        - Four 4 to 6 mm polyps in the transverse colon,                         removed with a cold snare. Resected and retrieved.                        - Non-bleeding internal hemorrhoids.                        - Mucosal resection was performed. Resection and                         retrieval were complete. Recommendation:        - Discharge patient to home.                        - Resume previous diet.                        - Continue present medications.                        - Await pathology results.                        - Repeat colonoscopy is not recommended for                         surveillance. Procedure Code(s):     --- Professional ---                        [phone], Colonoscopy, flexible; with endoscopic mucosal                         resection                        [id number], 42, Colonoscopy, flexible; with removal of                         tumor(s), polyp(s), or other lesion(s) by snare  technique Diagnosis Code(s):     --- Professional ---                        Z86.010, Personal history of colonic polyps                        K63.5, Polyp of colon CPT copyright 2019 [employer] Association. All rights reserved. The codes documented in this report are preliminary and upon coder review may  be revised to meet current compliance requirements. [name] MD, MD 03/21/2021 10:40:27 AM This report has been signed electronically. Number of Addenda: 0 Note Initiated On: 03/21/2021 10:05 AM Scope Withdrawal Time: 0 hours 6 minutes 5 seconds  Total Procedure Duration: 0 hours 19 minutes 29 seconds  Estimated Blood Loss:  Estimated blood loss: none.      [facility]

## 2021-03-21 NOTE — Anesthesia Preprocedure Evaluation (Addendum)
Anesthesia Evaluation  Patient identified by MRN, date of birth, ID band Patient awake    Reviewed: Allergy & Precautions, NPO status , Patient's Chart, lab work & pertinent test results  History of Anesthesia Complications Negative for: history of anesthetic complications  Airway Mallampati: III   Neck ROM: Full    Dental no notable dental hx.    Pulmonary former smoker (quit 1985),    Pulmonary exam normal breath sounds clear to auscultation       Cardiovascular  Rhythm:Regular Rate:Normal + Systolic murmurs ECG 01/0/07: normal   Neuro/Psych negative neurological ROS     GI/Hepatic negative GI ROS, (+) Cirrhosis       ,   Endo/Other  Hypothyroidism   Renal/GU negative Renal ROS     Musculoskeletal  (+) Arthritis ,   Abdominal   Peds  Hematology negative hematology ROS (+)   Anesthesia Other Findings   Reproductive/Obstetrics                            Anesthesia Physical Anesthesia Plan  ASA: 2  Anesthesia Plan: General   Post-op Pain Management:    Induction: Intravenous  PONV Risk Score and Plan: 2 and Propofol infusion, TIVA and Treatment may vary due to age or medical condition  Airway Management Planned: Natural Airway  Additional Equipment:   Intra-op Plan:   Post-operative Plan:   Informed Consent: I have reviewed the patients History and Physical, chart, labs and discussed the procedure including the risks, benefits and alternatives for the proposed anesthesia with the patient or authorized representative who has indicated his/her understanding and acceptance.       Plan Discussed with: CRNA  Anesthesia Plan Comments:         Anesthesia Quick Evaluation

## 2021-03-21 NOTE — Interval H&P Note (Signed)
[name], MD [address] [place], [place] [address] Phone:[phone] Fax : [phone]  Primary Care Physician:  [name], MD Primary Gastroenterologist:  Dr. Allen Norris  Pre-Procedure History & Physical: HPI:  Tony Luna is a 80 y.o. male is here for an colonoscopy.   Past Medical History:  Diagnosis Date   Allergy    Arthritis    Cirrhosis ([facility])    Complication of anesthesia    :"need catheter"   Dysrhythmia    ? something told by reg dr  unable to say what it was   Esophageal tear    Hypothyroidism    Pneumonia    hx   Primary osteoarthritis of left knee 06/22/2014   Rotator cuff tear     Past Surgical History:  Procedure Laterality Date   blepharaplasty     CATARACT EXTRACTION W/PHACO Left 02/21/2021   Procedure: CATARACT EXTRACTION PHACO AND INTRAOCULAR LENS PLACEMENT ([facility]) LEFT 4.56 00:34.9;  Surgeon: Birder Robson, MD;  Location: [facility];  Service: Ophthalmology;  Laterality: Left;   CAUTERY OF TURBINATES     COLONOSCOPY WITH PROPOFOL N/A 07/19/2015   Procedure: COLONOSCOPY WITH PROPOFOL;  Surgeon: [name], MD;  Location: [facility] ENDOSCOPY;  Service: Endoscopy;  Laterality: N/A;   HERNIA REPAIR Left    inguinal herniorrhapy   KNEE SURGERY     ? loose body/ chondral defect 34yrs   ROTATOR CUFF REPAIR     left   SPINE SURGERY     TONSILLECTOMY     TOTAL KNEE ARTHROPLASTY Left 06/22/2014   Procedure: TOTAL KNEE ARTHROPLASTY;  Surgeon: [name], MD;  Location: [facility];  Service: Orthopedics;  Laterality: Left;    Prior to Admission medications   Medication Sig Start Date End Date Taking? Authorizing Provider  Multiple Vitamin (MULTIVITAMIN WITH MINERALS) TABS tablet Take 1 tablet by mouth 2 (two) times daily.   Yes [provider]  NP THYROID 60 MG tablet TAKE ONE TABLET (60 MG) BY MOUTH EVERY DAY 02/20/21  Yes [name], MD  anastrozole (ARIMIDEX) 1 MG tablet Take 1 mg by mouth once a week. 01/04/21    [provider]  Boron 6 MG TABS Take by mouth daily.    [provider]  Cholecalciferol (VITAMIN D-3 PO) Take 5,000 Units by mouth daily. 5,00000 u in the morning, 2,000 u in the evening    [provider]  diphenoxylate-atropine (LOMOTIL) 2.5-0.025 MG tablet Take 1-2 tablets by mouth every 6 (six) hours as needed. 11/04/20   [provider]  EPINEPHrine 0.3 mg/0.3 mL IJ SOAJ injection Inject into the muscle as directed. 12/07/20   [provider]  MILK THISTLE PO Take 1 tablet by mouth 2 (two) times daily.    [provider]  NON [place]ette [place] Thought Takes 2 capsule bid.    [provider]  OVER THE COUNTER MEDICATION Take 4 sprays by mouth at bedtime. Secretropin oral spray - growth hormone    [provider]  OVER THE COUNTER MEDICATION Take 1 capsule by mouth 3 (three) times daily. Instaflex    [provider]  OVER THE COUNTER MEDICATION Take 1,000 mg by mouth daily. Turmeric with Meriva    [provider]  OVER THE COUNTER MEDICATION Take 3,000 mg by mouth daily. Omega Cure    [provider]  OVER THE COUNTER MEDICATION Take 250 mg by mouth daily. Bio Magnesium Citrate    [provider]  OVER THE COUNTER MEDICATION Take 20,000 mcg by mouth daily. Vitamin B12-methylfolate    [provider]  OVER THE COUNTER MEDICATION Take 1 tablet by mouth daily. Pure adrenal 400    [provider]  OVER THE COUNTER MEDICATION Take 800 mcg by mouth daily. Active B Folate    [provider]  OVER THE COUNTER MEDICATION 2 tablets daily. VitaPrime    [provider]  OVER THE COUNTER MEDICATION 1,000 mg daily. Super Bio-C buffered    [provider]  OVER THE COUNTER MEDICATION 1,000 mg daily. L'Arginine    [provider]  OVER THE COUNTER MEDICATION 2 capsules 2 (two) times daily. SLF Forte    [provider]  OVER THE COUNTER  MEDICATION 1,000 mg. Myomin    [provider]  Saw Palmetto 160 MG TABS  05/30/19   [provider]  sildenafil (VIAGRA) 50 MG tablet USE AS DIRECTED 02/23/21   [name], MD  Sod Picosulfate-Mag Ox-Cit Acd (CLENPIQ) 10-3.5-12 MG-GM -GM/160ML SOLN Take 320 mLs by mouth as directed. 02/22/21   [name], MD  tadalafil (CIALIS) 20 MG tablet TAKE ONE TABLET DAILY AS NEEDED FOR ERECTILE DYSFUNCTION 02/23/21   [name], MD  tamsulosin (FLOMAX) 0.4 MG CAPS capsule TAKE 1 CAPSULE BY MOUTH ONCE DAILY 03/15/21   [name], MD  TAURINE PO Take 1,000 mg by mouth daily.     [provider]  Testosterone 75 MG PLLT by Implant route.    [provider]  zolpidem (AMBIEN) 5 MG tablet TAKE 1/2-1 TABLET BY MOUTH AT BEDTIME ASNEEDED FOR SLEEP 12/20/20   [name], MD    Allergies as of 02/24/2021   (No Known Allergies)    Family History  Problem Relation Age of Onset   Alcohol abuse Mother    Diabetes Mother    Mental retardation Mother     Social History   Socioeconomic History   Marital status: Married    Spouse name: Not on file   Number of children: 2   Years of education: Not on file   Highest education level: Not on file  Occupational History   Occupation: textiles  Tobacco Use   Smoking status: Former    Packs/day: 1.00    Years: 15.00    Pack years: 15.00    Types: Cigarettes    Quit date: 06/15/1983    Years since quitting: 37.7   Smokeless tobacco: Current    Types: Chew  Vaping Use   Vaping Use: Never used  Substance and Sexual Activity   Alcohol use: Yes    Alcohol/week: 10.0 standard drinks    Types: 10 Glasses of wine per week   Drug use: No   Sexual activity: Not Currently  Other Topics Concern   Not on file  Social History Narrative   [employer] graduate   [place]   Very active   Walks 1 hour daily   Social Determinants of Health   Financial Resource Strain: Low Risk    Difficulty of Paying Living  Expenses: Not hard at all  Food Insecurity: No Food Insecurity   Worried About Charity fundraiser in the Last Year: Never true   Arboriculturist in the Last Year: Never true  Transportation Needs: No Transportation Needs   Lack of Transportation (Medical): No   Lack of Transportation (Non-Medical): No  Physical Activity: Sufficiently Active   Days of Exercise per Week: 7 days  Minutes of Exercise per Session: 60 min  Stress: No Stress Concern Present   Feeling of Stress : Not at all  Social Connections: Socially Integrated   Frequency of Communication with Friends and Family: More than three times a week   Frequency of Social Gatherings with Friends and Family: More than three times a week   Attends Religious Services: 1 to 4 times per year   Active Member of [employer] or Organizations: Yes   Attends Archivist Meetings: 1 to 4 times per year   Marital Status: Married  Human resources officer Violence: Not At Risk   Fear of Current or Ex-Partner: No   Emotionally Abused: No   Physically Abused: No   Sexually Abused: No    Review of Systems: See HPI, otherwise negative ROS  Physical Exam: BP 133/63   Pulse 77   Temp 97.8 F (36.6 C) (Temporal)   Resp 17   Ht 6' (1.829 m)   Wt 73.9 kg   SpO2 98%   BMI 22.11 kg/m  General:   Alert,  pleasant and cooperative in NAD Head:  Normocephalic and atraumatic. Neck:  Supple; no masses or thyromegaly. Lungs:  Clear throughout to auscultation.    Heart:  Regular rate and rhythm. Abdomen:  Soft, nontender and nondistended. Normal bowel sounds, without guarding, and without rebound.   Neurologic:  Alert and  oriented x4;  grossly normal neurologically.  Impression/Plan: Tony Luna is here for an colonoscopy to be performed for a history of adenomatous polyps on 2017   Risks, benefits, limitations, and alternatives regarding  colonoscopy have been reviewed with the patient.  Questions have been answered.  All parties  agreeable.   [name], MD  03/21/2021, 10:02 AM

## 2021-03-21 NOTE — Anesthesia Postprocedure Evaluation (Signed)
Anesthesia Post Note  Patient: Tony Luna  Procedure(s) Performed: COLONOSCOPY WITH PROPOFOL  Patient location during evaluation: PACU Anesthesia Type: General Level of consciousness: combative Pain management: pain level controlled Vital Signs Assessment: post-procedure vital signs reviewed and stable Respiratory status: spontaneous breathing, nonlabored ventilation and respiratory function stable Cardiovascular status: blood pressure returned to baseline and stable Postop Assessment: adequate PO intake Anesthetic complications: no   No notable events documented.   Last Vitals:  Vitals:   03/21/21 1051 03/21/21 1101  BP: (!) 145/70 (!) 143/73  Pulse: 72 73  Resp: 12 15  Temp:    SpO2: 98% 99%    Last Pain:  Vitals:   03/21/21 1101  TempSrc:   PainSc: 0-No pain                 Darrin Nipper

## 2021-03-21 NOTE — Transfer of Care (Signed)
Immediate Anesthesia Transfer of Care Note  Patient: Tony Luna  Procedure(s) Performed: COLONOSCOPY WITH PROPOFOL  Patient Location: PACU  Anesthesia Type:General  Level of Consciousness: sedated  Airway & Oxygen Therapy: Patient Spontanous Breathing and Patient connected to nasal cannula oxygen  Post-op Assessment: Report given to RN and Post -op Vital signs reviewed and stable  Post vital signs: Reviewed and stable  Last Vitals:  Vitals Value Taken Time  BP 117/53 03/21/21 1041  Temp 36.1 C 03/21/21 1041  Pulse 68 03/21/21 1043  Resp 13 03/21/21 1043  SpO2 96 % 03/21/21 1043  Vitals shown include unvalidated device data.  Last Pain:  Vitals:   03/21/21 1041  TempSrc: Temporal  PainSc: 0-No pain         Complications: No notable events documented.

## 2021-03-22 ENCOUNTER — Encounter: Payer: Self-pay | Admitting: Gastroenterology

## 2021-03-22 LAB — SURGICAL PATHOLOGY

## 2021-03-28 ENCOUNTER — Telehealth: Payer: Self-pay | Admitting: Internal Medicine

## 2021-03-28 NOTE — Telephone Encounter (Signed)
Lft pt vm to call ofc to sch US abdomen follow up. thanks

## 2021-03-31 ENCOUNTER — Telehealth: Payer: Self-pay

## 2021-03-31 NOTE — Telephone Encounter (Signed)
-----   Message from [name], MD sent at 03/30/2021 12:09 PM EDT ----- Please have the patient come in for a follow up.

## 2021-03-31 NOTE — Telephone Encounter (Signed)
Pt scheduled for a follow up appt to discuss results.

## 2021-04-04 DIAGNOSIS — E038 Other specified hypothyroidism: Secondary | ICD-10-CM | POA: Diagnosis not present

## 2021-04-04 DIAGNOSIS — Z79890 Hormone replacement therapy (postmenopausal): Secondary | ICD-10-CM | POA: Diagnosis not present

## 2021-04-04 DIAGNOSIS — E291 Testicular hypofunction: Secondary | ICD-10-CM | POA: Diagnosis not present

## 2021-04-06 DIAGNOSIS — M2550 Pain in unspecified joint: Secondary | ICD-10-CM | POA: Diagnosis not present

## 2021-04-06 DIAGNOSIS — G479 Sleep disorder, unspecified: Secondary | ICD-10-CM | POA: Diagnosis not present

## 2021-04-06 DIAGNOSIS — Z6822 Body mass index (BMI) 22.0-22.9, adult: Secondary | ICD-10-CM | POA: Diagnosis not present

## 2021-04-06 DIAGNOSIS — R6882 Decreased libido: Secondary | ICD-10-CM | POA: Diagnosis not present

## 2021-04-06 DIAGNOSIS — D696 Thrombocytopenia, unspecified: Secondary | ICD-10-CM | POA: Diagnosis not present

## 2021-04-06 DIAGNOSIS — E038 Other specified hypothyroidism: Secondary | ICD-10-CM | POA: Diagnosis not present

## 2021-04-06 DIAGNOSIS — N529 Male erectile dysfunction, unspecified: Secondary | ICD-10-CM | POA: Diagnosis not present

## 2021-04-06 DIAGNOSIS — E291 Testicular hypofunction: Secondary | ICD-10-CM | POA: Diagnosis not present

## 2021-04-06 DIAGNOSIS — R5383 Other fatigue: Secondary | ICD-10-CM | POA: Diagnosis not present

## 2021-04-07 ENCOUNTER — Encounter: Payer: Self-pay | Admitting: Urology

## 2021-04-07 ENCOUNTER — Other Ambulatory Visit: Payer: Self-pay

## 2021-04-07 ENCOUNTER — Ambulatory Visit: Payer: PPO | Admitting: Urology

## 2021-04-07 VITALS — BP 158/80 | HR 87 | Ht 72.0 in | Wt 163.5 lb

## 2021-04-07 DIAGNOSIS — E291 Testicular hypofunction: Secondary | ICD-10-CM

## 2021-04-07 DIAGNOSIS — N529 Male erectile dysfunction, unspecified: Secondary | ICD-10-CM

## 2021-04-07 DIAGNOSIS — N401 Enlarged prostate with lower urinary tract symptoms: Secondary | ICD-10-CM

## 2021-04-07 DIAGNOSIS — N138 Other obstructive and reflux uropathy: Secondary | ICD-10-CM | POA: Diagnosis not present

## 2021-04-07 LAB — URINALYSIS, COMPLETE
Glucose, UA: NEGATIVE
Leukocytes,UA: NEGATIVE
Nitrite, UA: NEGATIVE
Specific Gravity, UA: >1.03 — ABNORMAL HIGH (ref 1.005–1.030)
Urobilinogen, Ur: 1 mg/dL (ref 0.2–1.0)
pH, UA: 5.5 (ref 5.0–7.5)

## 2021-04-07 LAB — MICROSCOPIC EXAMINATION: Bacteria, UA: NONE SEEN

## 2021-04-07 NOTE — Progress Notes (Signed)
04/07/2021 11:26 AM   Tony Luna Sep 27, 1940 [id number]  Referring provider: [name], MD [address] [place],  [place] [address]  Chief Complaint  Patient presents with   Benign Prostatic Hypertrophy    Urologic history:  1.  BPH with LUTS Tamsulosin 0.4 mg daily  2.  Erectile dysfunction PDE 5 inhibitor used intermittently  3.  Hypogonadism TRT with subcu testosterone pellets Managed by [facility] in [place]   HPI: 80 y.o. male presents for annual follow-up.  Brought in recent lab work from 04/05/2021: Testosterone level 999 and PSA 1.1 No bothersome LUTS; remains on tamsulosin which is refilled by Dr. Derrel Nip [facility] today 4/35 Stable ED; PDE 5 inhibitors prescribed by Dr. Kathyrn Drown with TRT followed in [facility]   PMH: Past Medical History:  Diagnosis Date   Allergy    Arthritis    Cirrhosis ([facility])    Complication of anesthesia    :"need catheter"   Dysrhythmia    ? something told by reg dr  unable to say what it was   Esophageal tear    Hypothyroidism    Pneumonia    hx   Primary osteoarthritis of left knee 06/22/2014   Rotator cuff tear     Surgical History: Past Surgical History:  Procedure Laterality Date   blepharaplasty     CATARACT EXTRACTION W/PHACO Left 02/21/2021   Procedure: CATARACT EXTRACTION PHACO AND INTRAOCULAR LENS PLACEMENT ([facility]) LEFT 4.56 00:34.9;  Surgeon: Birder Robson, MD;  Location: [facility];  Service: Ophthalmology;  Laterality: Left;   CAUTERY OF TURBINATES     COLONOSCOPY WITH PROPOFOL N/A 07/19/2015   Procedure: COLONOSCOPY WITH PROPOFOL;  Surgeon: [name], MD;  Location: [facility];  Service: Endoscopy;  Laterality: N/A;   COLONOSCOPY WITH PROPOFOL N/A 03/21/2021   Procedure: COLONOSCOPY WITH PROPOFOL;  Surgeon: [name], MD;  Location: [facility];  Service: Endoscopy;  Laterality: N/A;   HERNIA REPAIR Left    inguinal herniorrhapy   KNEE SURGERY     ?  loose body/ chondral defect 75yrs   ROTATOR CUFF REPAIR     left   SPINE SURGERY     TONSILLECTOMY     TOTAL KNEE ARTHROPLASTY Left 06/22/2014   Procedure: TOTAL KNEE ARTHROPLASTY;  Surgeon: [name], MD;  Location: [facility];  Service: Orthopedics;  Laterality: Left;    Home Medications:  Allergies as of 04/07/2021   No Known Allergies      Medication List        Accurate as of April 07, 2021 11:26 AM. If you have any questions, ask your nurse or doctor.          STOP taking these medications    Clenpiq 10-3.5-12 MG-GM -GM/160ML Soln Generic drug: Sod Picosulfate-Mag Ox-Cit Acd Stopped by: Abbie Sons, MD   diphenoxylate-atropine 2.5-0.025 MG tablet Commonly known as: LOMOTIL Stopped by: Abbie Sons, MD   EPINEPHrine 0.3 mg/0.3 mL Soaj injection Commonly known as: EPI-PEN Stopped by: Abbie Sons, MD   OVER THE COUNTER MEDICATION Stopped by: Abbie Sons, MD   OVER THE COUNTER MEDICATION Stopped by: Abbie Sons, MD   VITAMIN D-3 PO Stopped by: Abbie Sons, MD       TAKE these medications    anastrozole 1 MG tablet Commonly known as: ARIMIDEX Take 1 mg by mouth once a week.   Boron 6 MG Tabs Take by mouth daily.   MILK THISTLE PO Take  1 tablet by mouth 2 (two) times daily.   multivitamin with minerals Tabs tablet Take 1 tablet by mouth 2 (two) times daily.   NON FORMULARY Sharp Thought Takes 2 capsule bid.   NP Thyroid 60 MG tablet Generic drug: thyroid TAKE ONE TABLET (60 MG) BY MOUTH EVERY DAY   OVER THE COUNTER MEDICATION Take 250 mg by mouth daily. Bio Magnesium Citrate   OVER THE COUNTER MEDICATION Take 20,000 mcg by mouth daily. Vitamin B12-methylfolate   OVER THE COUNTER MEDICATION Take 800 mcg by mouth daily. Active B Folate   OVER THE COUNTER MEDICATION 2 tablets daily. VitaPrime   OVER THE COUNTER MEDICATION Take 4 sprays by mouth at bedtime. Secretropin oral spray - growth hormone   OVER THE  COUNTER MEDICATION Take 1 capsule by mouth 3 (three) times daily. Instaflex   OVER THE COUNTER MEDICATION Take 1,000 mg by mouth daily. Turmeric with Meriva   OVER THE COUNTER MEDICATION 1,000 mg daily. Super Bio-C buffered   OVER THE COUNTER MEDICATION 1,000 mg daily. L'Arginine   OVER THE COUNTER MEDICATION 2 capsules 2 (two) times daily. SLF Forte   OVER THE COUNTER MEDICATION 1,000 mg. Myomin   Saw Palmetto 160 MG Tabs   sildenafil 50 MG tablet Commonly known as: VIAGRA USE AS DIRECTED   tadalafil 20 MG tablet Commonly known as: CIALIS TAKE ONE TABLET DAILY AS NEEDED FOR ERECTILE DYSFUNCTION   tamsulosin 0.4 MG Caps capsule Commonly known as: FLOMAX TAKE 1 CAPSULE BY MOUTH ONCE DAILY   TAURINE PO Take 1,000 mg by mouth daily.   Testosterone 75 MG Pllt by Implant route.   zolpidem 5 MG tablet Commonly known as: AMBIEN TAKE 1/2-1 TABLET BY MOUTH AT BEDTIME ASNEEDED FOR SLEEP        Allergies: No Known Allergies  Family History: Family History  Problem Relation Age of Onset   Alcohol abuse Mother    Diabetes Mother    Mental retardation Mother     Social History:  reports that he quit smoking about 37 years ago. His smoking use included cigarettes. He has a 15.00 pack-year smoking history. His smokeless tobacco use includes chew. He reports current alcohol use of about 10.0 standard drinks per week. He reports that he does not use drugs.   Physical Exam: BP (!) 158/80 (BP Location: Left Arm, Patient Position: Sitting, Cuff Size: Normal)   Pulse 87   Ht 6' (1.829 m)   Wt 163 lb 8 oz (74.2 kg)   BMI 22.17 kg/m   Constitutional:  Alert and oriented, No acute distress. HEENT: [place] AT, moist mucus membranes.  Trachea midline, no masses. Cardiovascular: No clubbing, cyanosis, or edema. Respiratory: Normal respiratory effort, no increased work of breathing. GI: Abdomen is soft, nontender, nondistended, no abdominal masses GU: Prostate 50 g, smooth  without nodules Psychiatric: Normal mood and affect.   Assessment & Plan:    1.  BPH with LUTS Mild LUTS, stable on tamsulosin  2.  Erectile dysfunction Stable  3.  Hypogonadism Stable Administered/monitored in [facility], [place] [address] [place], [place] [address] [phone]

## 2021-04-10 ENCOUNTER — Other Ambulatory Visit: Payer: Self-pay

## 2021-04-10 ENCOUNTER — Ambulatory Visit (INDEPENDENT_AMBULATORY_CARE_PROVIDER_SITE_OTHER): Payer: PPO | Admitting: Gastroenterology

## 2021-04-10 ENCOUNTER — Encounter: Payer: Self-pay | Admitting: Gastroenterology

## 2021-04-10 VITALS — BP 170/73 | HR 85 | Temp 98.0°F | Wt 169.6 lb

## 2021-04-10 DIAGNOSIS — Z86010 Personal history of colonic polyps: Secondary | ICD-10-CM | POA: Diagnosis not present

## 2021-04-10 MED ORDER — CLENPIQ 10-3.5-12 MG-GM -GM/160ML PO SOLN: 1.0000 | Freq: Once | ORAL | 0 refills | Status: AC

## 2021-04-10 NOTE — Progress Notes (Signed)
  Primary Care Physician: Tullo, Teresa L, MD  Primary Gastroenterologist:  Dr.    Chief Complaint  Patient presents with   Follow-up    HPI: Tony Luna is a 80 y.o. male here for follow-up after a colonoscopy.  The patient had multiple polyps found in the colon and the polyp at the hepatic flexure of the transverse colon showed:  B.  COLON POLYP X5, TRANSVERSE; COLD SNARE (4) AND HOT SNARE (1):  - TUBULAR ADENOMA WITH FOCAL HIGH-GRADE DYSPLASIA (MULTIPLE FRAGMENTS).  - NEGATIVE FOR MALIGNANCY.   The patient is now here for review of the pathology results and to plan further management of this finding.  Past Medical History:  Diagnosis Date   Allergy    Arthritis    Cirrhosis ([facility])    Complication of anesthesia    :"need catheter"   Dysrhythmia    ? something told by reg dr  unable to say what it was   Esophageal tear    Hypothyroidism    Pneumonia    hx   Primary osteoarthritis of left knee 06/22/2014   Rotator cuff tear     Current Outpatient Medications  Medication Sig Dispense Refill   anastrozole (ARIMIDEX) 1 MG tablet Take 1 mg by mouth once a week.     Boron 6 MG TABS Take by mouth daily.     MILK THISTLE PO Take 1 tablet by mouth 2 (two) times daily.     Multiple Vitamin (MULTIVITAMIN WITH MINERALS) TABS tablet Take 1 tablet by mouth 2 (two) times daily.     NON FORMULARY Sharp Thought Takes 2 capsule bid.     NP THYROID 60 MG tablet TAKE ONE TABLET (60 MG) BY MOUTH EVERY DAY 90 tablet 0   OVER THE COUNTER MEDICATION Take 4 sprays by mouth at bedtime. Secretropin oral spray - growth hormone     OVER THE COUNTER MEDICATION Take 1 capsule by mouth 3 (three) times daily. Instaflex     OVER THE COUNTER MEDICATION Take 1,000 mg by mouth daily. Turmeric with Meriva     OVER THE COUNTER MEDICATION Take 250 mg by mouth daily. Bio Magnesium Citrate     OVER THE COUNTER MEDICATION Take 20,000 mcg by mouth daily. Vitamin B12-methylfolate     OVER THE  COUNTER MEDICATION Take 800 mcg by mouth daily. Active B Folate     OVER THE COUNTER MEDICATION 2 tablets daily. VitaPrime     OVER THE COUNTER MEDICATION 1,000 mg daily. Super Bio-C buffered     OVER THE COUNTER MEDICATION 1,000 mg daily. Luna'Arginine     OVER THE COUNTER MEDICATION 2 capsules 2 (two) times daily. SLF Forte     OVER THE COUNTER MEDICATION 1,000 mg. Myomin     Saw Palmetto 160 MG TABS      sildenafil (VIAGRA) 50 MG tablet USE AS DIRECTED 90 tablet 0   Sod Picosulfate-Mag Ox-Cit Acd (CLENPIQ) 10-3.5-12 MG-GM -GM/160ML SOLN Take 1 kit by mouth once for 1 dose. 320 mL 0   tadalafil (CIALIS) 20 MG tablet TAKE ONE TABLET DAILY AS NEEDED FOR ERECTILE DYSFUNCTION 240 tablet 0   tamsulosin (FLOMAX) 0.4 MG CAPS capsule TAKE 1 CAPSULE BY MOUTH ONCE DAILY 90 capsule 1   TAURINE PO Take 1,000 mg by mouth daily.      Testosterone 75 MG PLLT by Implant route.     zolpidem (AMBIEN) 5 MG tablet TAKE 1/2-1 TABLET BY MOUTH AT BEDTIME ASNEEDED FOR SLEEP 30 tablet   2   No current facility-administered medications for this visit.    Allergies as of 04/10/2021   (No Known Allergies)    ROS:  General: Negative for anorexia, weight loss, fever, chills, fatigue, weakness. ENT: Negative for hoarseness, difficulty swallowing , nasal congestion. CV: Negative for chest pain, angina, palpitations, dyspnea on exertion, peripheral edema.  Respiratory: Negative for dyspnea at rest, dyspnea on exertion, cough, sputum, wheezing.  GI: See history of present illness. GU:  Negative for dysuria, hematuria, urinary incontinence, urinary frequency, nocturnal urination.  Endo: Negative for unusual weight change.    Physical Examination:   BP (!) 170/73   Pulse 85   Temp 98 F (36.7 C) (Oral)   Wt 169 lb 9.6 oz (76.9 kg)   BMI 23.00 kg/m   General: Well-nourished, well-developed in no acute distress.  Eyes: No icterus. Conjunctivae pink. Neuro: Alert and oriented x 3.  Grossly intact. Skin: Warm and  dry, no jaundice.   Psych: Alert and cooperative, normal mood and affect.  Labs:    Imaging Studies: No results found.  Assessment and Plan:   [name] is a 80 y.o. y/o male who comes in with a recent colonoscopy with high-grade dysplasia and a polyp that was reported to be in multiple fragments. The patient has been told about the need to make sure the polyp is completely gone.  The patient will be set up for repeat colonoscopy to assess the area of the polyp removal for any residual polyp.  Is also been told that a repeat colonoscopy would be recommended in 1 year.  The patient states that he is very healthy and expects to live the next 10-15 years since he is healthy and would like to be aggressive with his health.  The patient has been in the plan agrees with it.     Tony Lame, MD. Tony Luna    Note: This dictation was prepared with Dragon dictation along with smaller phrase technology. Any transcriptional errors that result from this process are unintentional.

## 2021-04-10 NOTE — H&P (View-Only) (Signed)
  Primary Care Physician: Tullo, Teresa L, MD  Primary Gastroenterologist:  Dr. Marlo Goodrich  Chief Complaint  Patient presents with   Follow-up    HPI: Tony Luna is a 80 y.o. male here for follow-up after a colonoscopy.  The patient had multiple polyps found in the colon and the polyp at the hepatic flexure of the transverse colon showed:  B.  COLON POLYP X5, TRANSVERSE; COLD SNARE (4) AND HOT SNARE (1):  - TUBULAR ADENOMA WITH FOCAL HIGH-GRADE DYSPLASIA (MULTIPLE FRAGMENTS).  - NEGATIVE FOR MALIGNANCY.   The patient is now here for review of the pathology results and to plan further management of this finding.  Past Medical History:  Diagnosis Date   Allergy    Arthritis    Cirrhosis (HCC)    Complication of anesthesia    :"need catheter"   Dysrhythmia    ? something told by reg dr  unable to say what it was   Esophageal tear    Hypothyroidism    Pneumonia    hx   Primary osteoarthritis of left knee 06/22/2014   Rotator cuff tear     Current Outpatient Medications  Medication Sig Dispense Refill   anastrozole (ARIMIDEX) 1 MG tablet Take 1 mg by mouth once a week.     Boron 6 MG TABS Take by mouth daily.     MILK THISTLE PO Take 1 tablet by mouth 2 (two) times daily.     Multiple Vitamin (MULTIVITAMIN WITH MINERALS) TABS tablet Take 1 tablet by mouth 2 (two) times daily.     NON FORMULARY Sharp Thought Takes 2 capsule bid.     NP THYROID 60 MG tablet TAKE ONE TABLET (60 MG) BY MOUTH EVERY DAY 90 tablet 0   OVER THE COUNTER MEDICATION Take 4 sprays by mouth at bedtime. Secretropin oral spray - growth hormone     OVER THE COUNTER MEDICATION Take 1 capsule by mouth 3 (three) times daily. Instaflex     OVER THE COUNTER MEDICATION Take 1,000 mg by mouth daily. Turmeric with Meriva     OVER THE COUNTER MEDICATION Take 250 mg by mouth daily. Bio Magnesium Citrate     OVER THE COUNTER MEDICATION Take 20,000 mcg by mouth daily. Vitamin B12-methylfolate     OVER THE  COUNTER MEDICATION Take 800 mcg by mouth daily. Active B Folate     OVER THE COUNTER MEDICATION 2 tablets daily. VitaPrime     OVER THE COUNTER MEDICATION 1,000 mg daily. Super Bio-C buffered     OVER THE COUNTER MEDICATION 1,000 mg daily. L'Arginine     OVER THE COUNTER MEDICATION 2 capsules 2 (two) times daily. SLF Forte     OVER THE COUNTER MEDICATION 1,000 mg. Myomin     Saw Palmetto 160 MG TABS      sildenafil (VIAGRA) 50 MG tablet USE AS DIRECTED 90 tablet 0   Sod Picosulfate-Mag Ox-Cit Acd (CLENPIQ) 10-3.5-12 MG-GM -GM/160ML SOLN Take 1 kit by mouth once for 1 dose. 320 mL 0   tadalafil (CIALIS) 20 MG tablet TAKE ONE TABLET DAILY AS NEEDED FOR ERECTILE DYSFUNCTION 240 tablet 0   tamsulosin (FLOMAX) 0.4 MG CAPS capsule TAKE 1 CAPSULE BY MOUTH ONCE DAILY 90 capsule 1   TAURINE PO Take 1,000 mg by mouth daily.      Testosterone 75 MG PLLT by Implant route.     zolpidem (AMBIEN) 5 MG tablet TAKE 1/2-1 TABLET BY MOUTH AT BEDTIME ASNEEDED FOR SLEEP 30 tablet   2   No current facility-administered medications for this visit.    Allergies as of 04/10/2021   (No Known Allergies)    ROS:  General: Negative for anorexia, weight loss, fever, chills, fatigue, weakness. ENT: Negative for hoarseness, difficulty swallowing , nasal congestion. CV: Negative for chest pain, angina, palpitations, dyspnea on exertion, peripheral edema.  Respiratory: Negative for dyspnea at rest, dyspnea on exertion, cough, sputum, wheezing.  GI: See history of present illness. GU:  Negative for dysuria, hematuria, urinary incontinence, urinary frequency, nocturnal urination.  Endo: Negative for unusual weight change.    Physical Examination:   BP (!) 170/73   Pulse 85   Temp 98 F (36.7 C) (Oral)   Wt 169 lb 9.6 oz (76.9 kg)   BMI 23.00 kg/m   General: Well-nourished, well-developed in no acute distress.  Eyes: No icterus. Conjunctivae pink. Neuro: Alert and oriented x 3.  Grossly intact. Skin: Warm and  dry, no jaundice.   Psych: Alert and cooperative, normal mood and affect.  Labs:    Imaging Studies: No results found.  Assessment and Plan:   Tony Luna is a 80 y.o. y/o male who comes in with a recent colonoscopy with high-grade dysplasia and a polyp that was reported to be in multiple fragments. The patient has been told about the need to make sure the polyp is completely gone.  The patient will be set up for repeat colonoscopy to assess the area of the polyp removal for any residual polyp.  Is also been told that a repeat colonoscopy would be recommended in 1 year.  The patient states that he is very healthy and expects to live the next 10-15 years since he is healthy and would like to be aggressive with his health.  The patient has been in the plan agrees with it.     [name], MD. Marval Regal    Note: This dictation was prepared with Dragon dictation along with smaller phrase technology. Any transcriptional errors that result from this process are unintentional.

## 2021-04-16 DIAGNOSIS — Z4802 Encounter for removal of sutures: Secondary | ICD-10-CM | POA: Diagnosis not present

## 2021-04-17 ENCOUNTER — Encounter: Payer: Self-pay | Admitting: Gastroenterology

## 2021-04-17 ENCOUNTER — Ambulatory Visit
Admission: RE | Admit: 2021-04-17 | Discharge: 2021-04-17 | Disposition: A | Discharge status: Home / Self Care | Payer: PPO | Source: Ambulatory Visit | Attending: Internal Medicine | Admitting: Internal Medicine

## 2021-04-17 DIAGNOSIS — K7460 Unspecified cirrhosis of liver: Secondary | ICD-10-CM | POA: Diagnosis not present

## 2021-04-18 ENCOUNTER — Ambulatory Visit: Payer: PPO | Admitting: Anesthesiology

## 2021-04-18 ENCOUNTER — Encounter
Admission: RE | Disposition: A | Discharge status: Home / Self Care | Payer: Self-pay | Source: Ambulatory Visit | Attending: Gastroenterology

## 2021-04-18 ENCOUNTER — Encounter: Payer: Self-pay | Admitting: Gastroenterology

## 2021-04-18 ENCOUNTER — Ambulatory Visit
Admission: RE | Admit: 2021-04-18 | Discharge: 2021-04-18 | Disposition: A | Discharge status: Home / Self Care | Payer: PPO | Source: Ambulatory Visit | Attending: Gastroenterology | Admitting: Gastroenterology

## 2021-04-18 ENCOUNTER — Other Ambulatory Visit: Payer: Self-pay

## 2021-04-18 DIAGNOSIS — K635 Polyp of colon: Secondary | ICD-10-CM

## 2021-04-18 DIAGNOSIS — K641 Second degree hemorrhoids: Secondary | ICD-10-CM | POA: Insufficient documentation

## 2021-04-18 DIAGNOSIS — K5730 Diverticulosis of large intestine without perforation or abscess without bleeding: Secondary | ICD-10-CM | POA: Insufficient documentation

## 2021-04-18 DIAGNOSIS — D123 Benign neoplasm of transverse colon: Secondary | ICD-10-CM | POA: Diagnosis not present

## 2021-04-18 DIAGNOSIS — Z86010 Personal history of colonic polyps: Secondary | ICD-10-CM | POA: Insufficient documentation

## 2021-04-18 HISTORY — PX: COLONOSCOPY WITH PROPOFOL: SHX5780

## 2021-04-18 SURGERY — COLONOSCOPY WITH PROPOFOL
Anesthesia: General

## 2021-04-18 MED ORDER — PROPOFOL 500 MG/50ML IV EMUL
INTRAVENOUS | Status: DC | PRN
  Administered 2021-04-18: 200 ug/kg/min via INTRAVENOUS

## 2021-04-18 MED ORDER — SODIUM CHLORIDE 0.9 % IV SOLN: INTRAVENOUS | Status: DC

## 2021-04-18 MED ORDER — PROPOFOL 10 MG/ML IV BOLUS
INTRAVENOUS | Status: DC | PRN
  Administered 2021-04-18: 60 mg via INTRAVENOUS

## 2021-04-18 NOTE — Interval H&P Note (Signed)
[name], MD [address] [place], [place] [address] [phone] Fax : [phone]  Primary Care Physician:  [name], MD Primary Gastroenterologist:  Dr. Allen Norris  Pre-Procedure History & Physical: HPI:  Tony Luna is a 80 y.o. male is here for an colonoscopy.   Past Medical History:  Diagnosis Date   Allergy    Arthritis    Cirrhosis ([place])    Complication of anesthesia    :"need catheter"   Dysrhythmia    ? something told by reg dr  unable to say what it was   Esophageal tear    Hypothyroidism    Pneumonia    hx   Primary osteoarthritis of left knee 06/22/2014   Rotator cuff tear     Past Surgical History:  Procedure Laterality Date   BACK SURGERY     blepharaplasty     CATARACT EXTRACTION W/PHACO Left 02/21/2021   Procedure: CATARACT EXTRACTION PHACO AND INTRAOCULAR LENS PLACEMENT ([place]) LEFT 4.56 00:34.9;  Surgeon: Birder Robson, MD;  Location: [facility];  Service: Ophthalmology;  Laterality: Left;   CAUTERY OF TURBINATES     COLONOSCOPY WITH PROPOFOL N/A 07/19/2015   Procedure: COLONOSCOPY WITH PROPOFOL;  Surgeon: [name], MD;  Location: [facility] ENDOSCOPY;  Service: Endoscopy;  Laterality: N/A;   COLONOSCOPY WITH PROPOFOL N/A 03/21/2021   Procedure: COLONOSCOPY WITH PROPOFOL;  Surgeon: [name], MD;  Location: [facility] ENDOSCOPY;  Service: Endoscopy;  Laterality: N/A;   EYE SURGERY     HERNIA REPAIR Left    inguinal herniorrhapy   KNEE SURGERY     ? 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 Authorizing Provider  NP THYROID 60 MG tablet TAKE ONE TABLET (60 MG) BY MOUTH EVERY DAY 02/20/21  Yes [name], MD  zolpidem (AMBIEN) 5 MG tablet TAKE 1/2-1 TABLET BY MOUTH AT BEDTIME ASNEEDED FOR SLEEP 12/20/20  Yes [name], MD  anastrozole (ARIMIDEX) 1 MG tablet Take 1 mg by mouth once a week. 01/04/21   [provider]  Boron 6 MG TABS Take by mouth daily.    [provider]  MILK THISTLE PO Take 1 tablet by mouth 2 (two) times daily.    [provider]  Multiple Vitamin (MULTIVITAMIN WITH MINERALS) TABS tablet Take 1 tablet by mouth 2 (two) times daily.    [provider]  NON Lanette [place] Thought Takes 2 capsule bid.    [provider]  OVER THE COUNTER MEDICATION Take 4 sprays by mouth at bedtime. Secretropin oral spray - growth hormone    [provider]  OVER THE COUNTER MEDICATION Take 1 capsule by mouth 3 (three) times daily. Instaflex    [provider]  OVER THE COUNTER MEDICATION Take 1,000 mg by mouth daily. Turmeric with Meriva    [provider]  OVER THE COUNTER MEDICATION Take 250 mg by mouth daily. Bio Magnesium Citrate    [provider]  OVER THE COUNTER MEDICATION Take 20,000 mcg by mouth daily. Vitamin B12-methylfolate    [provider]  OVER THE COUNTER MEDICATION Take 800 mcg  by mouth daily. Active B Folate    [provider]  OVER THE COUNTER MEDICATION 2 tablets daily. VitaPrime    [provider]  OVER THE COUNTER MEDICATION 1,000 mg daily. Super Bio-C buffered    [provider]  OVER THE COUNTER MEDICATION 1,000 mg daily. L'Arginine    [provider]  OVER THE COUNTER MEDICATION 2 capsules 2 (two) times daily. SLF Forte    [provider]  OVER THE COUNTER MEDICATION 1,000 mg. Myomin    [provider]  Saw Palmetto 160 MG TABS  05/30/19   [provider]  sildenafil (VIAGRA) 50 MG tablet USE AS DIRECTED 02/23/21   [name], MD  tadalafil (CIALIS) 20 MG tablet TAKE ONE TABLET DAILY AS NEEDED FOR ERECTILE  DYSFUNCTION 02/23/21   [name], MD  tamsulosin (FLOMAX) 0.4 MG CAPS capsule TAKE 1 CAPSULE BY MOUTH ONCE DAILY 03/15/21   [name], MD  TAURINE PO Take 1,000 mg by mouth daily.     [provider]  Testosterone 75 MG PLLT by Implant route.    [provider]    Allergies as of 04/11/2021   (No Known Allergies)    Family History  Problem Relation Age of Onset   Alcohol abuse Mother    Diabetes Mother    Mental retardation Mother     Social History   Socioeconomic History   Marital status: Married    Spouse name: Not on file   Number of children: 2   Years of education: Not on file   Highest education level: Not on file  Occupational History   Occupation: textiles  Tobacco Use   Smoking status: Former    Packs/day: 1.00    Years: 15.00    Pack years: 15.00    Types: Cigarettes    Quit date: 06/15/1983    Years since quitting: 37.8   Smokeless tobacco: Current    Types: Chew  Vaping Use   Vaping Use: Never used  Substance and Sexual Activity   Alcohol use: Yes    Alcohol/week: 10.0 standard drinks    Types: 10 Glasses of wine per week   Drug use: No   Sexual activity: Not Currently  Other Topics Concern   Not on file  Social History Narrative   [employer] graduate   [place]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   Very active   Walks 1 hour daily   Social Determinants of Health   Financial Resource Strain: Low Risk    Difficulty of Paying Living Expenses: Not hard at all  Food Insecurity: No Food Insecurity   Worried About Charity fundraiser in the Last Year: Never true   Arboriculturist in the Last Year: Never true  Transportation Needs: No Transportation Needs   Lack of Transportation (Medical): No   Lack of Transportation (Non-Medical): No  Physical Activity: Sufficiently Active   Days of Exercise per Week: 7 days   Minutes of Exercise per Session: 60 min  Stress: No Stress Concern Present   Feeling of Stress : Not at all  Social Connections: Socially  Integrated   Frequency of Communication with Friends and Family: More than three times a week   Frequency of Social Gatherings with Friends and Family: More than three times a week   Attends Religious Services: 1 to 4 times per year   Active Member of [employer] or Organizations: Yes   Attends Archivist Meetings: 1 to 4 times per  year   Marital Status: Married  Human resources officer Violence: Not At Risk   Fear of Current or Ex-Partner: No   Emotionally Abused: No   Physically Abused: No   Sexually Abused: No    Review of Systems: See HPI, otherwise negative ROS  Physical Exam: BP (!) 161/80   Pulse 88   Temp (!) 97.2 F (36.2 C) (Temporal)   Resp 16   Ht 6' (1.829 m)   Wt 72.6 kg   SpO2 100%   BMI 21.70 kg/m  General:   Alert,  pleasant and cooperative in NAD Head:  Normocephalic and atraumatic. Neck:  Supple; no masses or thyromegaly. Lungs:  Clear throughout to auscultation.    Heart:  Regular rate and rhythm. Abdomen:  Soft, nontender and nondistended. Normal bowel sounds, without guarding, and without rebound.   Neurologic:  Alert and  oriented x4;  grossly normal neurologically.  Impression/Plan: Tony Luna is here for an colonoscopy to be performed for a history of adenomatous polyps on 2022   Risks, benefits, limitations, and alternatives regarding  colonoscopy have been reviewed with the patient.  Questions have been answered.  All parties agreeable.   [name], MD  04/18/2021, 9:13 AM

## 2021-04-18 NOTE — Anesthesia Postprocedure Evaluation (Signed)
Anesthesia Post Note  Patient: Tony Luna  Procedure(s) Performed: COLONOSCOPY WITH PROPOFOL  Patient location during evaluation: Phase II Anesthesia Type: General Level of consciousness: awake and alert, awake and oriented Pain management: pain level controlled Vital Signs Assessment: post-procedure vital signs reviewed and stable Respiratory status: spontaneous breathing, nonlabored ventilation and respiratory function stable Cardiovascular status: blood pressure returned to baseline and stable Postop Assessment: no apparent nausea or vomiting Anesthetic complications: no   No notable events documented.   Last Vitals:  Vitals:   04/18/21 0950 04/18/21 1000  BP: 117/60 (!) 160/80  Pulse: 88 84  Resp: 16 (!) 22  Temp:    SpO2: 100% 99%    Last Pain:  Vitals:   04/18/21 0950  TempSrc: Temporal  PainSc:                  Phill Mutter

## 2021-04-18 NOTE — Transfer of Care (Signed)
Immediate Anesthesia Transfer of Care Note  Patient: DIOGENES WHIRLEY  Procedure(s) Performed: COLONOSCOPY WITH PROPOFOL  Patient Location: PACU  Anesthesia Type:General  Level of Consciousness: awake, alert  and oriented  Airway & Oxygen Therapy: Patient Spontanous Breathing and Patient connected to nasal cannula oxygen  Post-op Assessment: Report given to RN and Post -op Vital signs reviewed and stable  Post vital signs: Reviewed and stable  Last Vitals:  Vitals Value Taken Time  BP 141/112 04/18/21 0951  Temp 36.2 C 04/18/21 0950  Pulse 90 04/18/21 0952  Resp 11 04/18/21 0952  SpO2 99 % 04/18/21 0952  Vitals shown include unvalidated device data.  Last Pain:  Vitals:   04/18/21 0950  TempSrc: Temporal  PainSc:          Complications: No notable events documented.

## 2021-04-18 NOTE — Op Note (Signed)
[facility] Gastroenterology Patient Name: Tony Luna Procedure Date: 04/18/2021 9:19 AM MRN: [id number] Account #: [id number] Date of Birth: 10/25/1940 Admit Type: Outpatient Age: 80 Room: [facility] ENDO ROOM 4 Gender: Male Note Status: Finalized Instrument Name: Park Meo [id number] Procedure:             Colonoscopy Indications:           High risk colon cancer surveillance: Personal history                         of adenoma with high grade dysplasia Providers:             [name] MD, MD Medicines:             Propofol per Anesthesia Complications:         No immediate complications. Procedure:             Pre-Anesthesia Assessment:                        - Prior to the procedure, a History and Physical was                         performed, and patient medications and allergies were                         reviewed. The patient's tolerance of previous                         anesthesia was also reviewed. The risks and benefits                         of the procedure and the sedation options and risks                         were discussed with the patient. All questions were                         answered, and informed consent was obtained. Prior                         Anticoagulants: The patient has taken no previous                         anticoagulant or antiplatelet agents. ASA Grade                         Assessment: II - A patient with mild systemic disease.                         After reviewing the risks and benefits, the patient                         was deemed in satisfactory condition to undergo the                         procedure.                        After obtaining informed consent, the colonoscope was  passed under direct vision. Throughout the procedure,                         the patient's blood pressure, pulse, and oxygen                         saturations were monitored continuously. The                          Colonoscope was introduced through the anus and                         advanced to the the cecum, identified by appendiceal                         orifice and ileocecal valve. The colonoscopy was                         performed without difficulty. The patient tolerated                         the procedure well. The quality of the bowel                         preparation was adequate to identify polyps. Findings:      The perianal and digital rectal examinations were normal.      A polyp was found in the transverse colon. The polyp was sessile.       Biopsies were taken with a cold forceps for histology. Preparations were       made for mucosal resection. Chromoscopy with indigo carmine was done to       mark the borders of the lesion. Saline with indigo carmine was injected       to raise the lesion. Snare mucosal resection was performed. Resection       and retrieval were complete.      Multiple small-mouthed diverticula were found in the entire colon.      Non-bleeding internal hemorrhoids were found during retroflexion. The       hemorrhoids were Grade II (internal hemorrhoids that prolapse but reduce       spontaneously). Impression:            - One polyp in the transverse colon, removed with                         mucosal resection. Resected and retrieved. Biopsied.                        - Diverticulosis in the entire examined colon.                        - Non-bleeding internal hemorrhoids.                        - Mucosal resection was performed. Resection and                         retrieval were complete. Recommendation:        - Discharge patient to home.                        -  Resume previous diet.                        - Continue present medications.                        - Await pathology results.                        - Repeat colonoscopy in 1 year for surveillance. Procedure Code(s):     --- Professional ---                        [phone],  Colonoscopy, flexible; with endoscopic mucosal                         resection Diagnosis Code(s):     --- Professional ---                        Z86.010, Personal history of colonic polyps                        K63.5, Polyp of colon CPT copyright 2019 American Medical Association. All rights reserved. The codes documented in this report are preliminary and upon coder review may  be revised to meet current compliance requirements. [name] MD, MD 04/18/2021 9:52:22 AM This report has been signed electronically. Number of Addenda: 0 Note Initiated On: 04/18/2021 9:19 AM Scope Withdrawal Time: 0 hours 1 minute 42 seconds  Total Procedure Duration: 0 hours 21 minutes 16 seconds  Estimated Blood Loss:  Estimated blood loss: none.      [facility]

## 2021-04-18 NOTE — Anesthesia Preprocedure Evaluation (Signed)
Anesthesia Evaluation  Patient identified by MRN, date of birth, ID band Patient awake    Reviewed: Allergy & Precautions, NPO status , Patient's Chart, lab work & pertinent test results  History of Anesthesia Complications Negative for: history of anesthetic complications  Airway Mallampati: III   Neck ROM: Full    Dental  (+) Poor Dentition   Pulmonary pneumonia, resolved, former smoker,    Pulmonary exam normal breath sounds clear to auscultation       Cardiovascular hypertension, + dysrhythmias + Valvular Problems/Murmurs  Rhythm:Regular Rate:Normal + Systolic murmurs ECG 93/7/34: normal   Neuro/Psych negative neurological ROS     GI/Hepatic negative GI ROS, (+) Cirrhosis       ,   Endo/Other  Hypothyroidism   Renal/GU negative Renal ROS     Musculoskeletal  (+) Arthritis ,   Abdominal   Peds  Hematology negative hematology ROS (+)   Anesthesia Other Findings Allergy    Arthritis    Cirrhosis (HCC)    Complication of anesthesia  :"need catheter"  Dysrhythmia  ? something told by reg dr unable to say what it was  Esophageal tear    Hypothyroidism    Pneumonia  hx  Primary osteoarthritis of left knee 06/22/2014   Rotator cuff tear       Reproductive/Obstetrics                             Anesthesia Physical  Anesthesia Plan  ASA: 3  Anesthesia Plan: General   Post-op Pain Management:    Induction: Intravenous  PONV Risk Score and Plan: 2 and Propofol infusion and TIVA  Airway Management Planned: Natural Airway and Nasal Cannula  Additional Equipment:   Intra-op Plan:   Post-operative Plan:   Informed Consent: I have reviewed the patients History and Physical, chart, labs and discussed the procedure including the risks, benefits and alternatives for the proposed anesthesia with the patient or authorized representative who has indicated his/her understanding  and acceptance.       Plan Discussed with: CRNA, Anesthesiologist and Surgeon  Anesthesia Plan Comments:         Anesthesia Quick Evaluation

## 2021-04-19 ENCOUNTER — Encounter: Payer: Self-pay | Admitting: Urology

## 2021-04-19 ENCOUNTER — Encounter: Payer: Self-pay | Admitting: Gastroenterology

## 2021-04-19 LAB — SURGICAL PATHOLOGY

## 2021-04-22 ENCOUNTER — Other Ambulatory Visit: Payer: Self-pay | Admitting: Internal Medicine

## 2021-04-24 ENCOUNTER — Encounter: Payer: Self-pay | Admitting: Gastroenterology

## 2021-04-24 NOTE — Telephone Encounter (Signed)
Last OV 02/20/2021 and last fill 03/22/2021 okay to fill Ambien?

## 2021-04-25 ENCOUNTER — Other Ambulatory Visit: Payer: Self-pay | Admitting: Urology

## 2021-04-25 DIAGNOSIS — R3129 Other microscopic hematuria: Secondary | ICD-10-CM

## 2021-05-18 ENCOUNTER — Telehealth: Payer: Self-pay | Admitting: Internal Medicine

## 2021-05-18 DIAGNOSIS — K2970 Gastritis, unspecified, without bleeding: Secondary | ICD-10-CM | POA: Diagnosis not present

## 2021-05-18 NOTE — Telephone Encounter (Signed)
noted 

## 2021-05-18 NOTE — Telephone Encounter (Signed)
Noted  

## 2021-05-18 NOTE — Telephone Encounter (Signed)
Pt called in regards to hiccups he has been experiencing for over 24 hours. Due to PCP being out of office pt was scheduled with Dr. Olivia Mackie tomorrow at [facility] declined the nurse line.

## 2021-05-19 ENCOUNTER — Ambulatory Visit: Payer: PPO | Admitting: Internal Medicine

## 2021-05-24 ENCOUNTER — Other Ambulatory Visit: Payer: Self-pay | Admitting: Internal Medicine

## 2021-06-07 ENCOUNTER — Other Ambulatory Visit: Payer: PPO | Admitting: Urology

## 2021-06-13 DIAGNOSIS — M545 Low back pain, unspecified: Secondary | ICD-10-CM | POA: Diagnosis not present

## 2021-06-13 DIAGNOSIS — M25511 Pain in right shoulder: Secondary | ICD-10-CM | POA: Diagnosis not present

## 2021-06-13 DIAGNOSIS — M25512 Pain in left shoulder: Secondary | ICD-10-CM | POA: Diagnosis not present

## 2021-06-13 DIAGNOSIS — S76111D Strain of right quadriceps muscle, fascia and tendon, subsequent encounter: Secondary | ICD-10-CM | POA: Diagnosis not present

## 2021-06-14 ENCOUNTER — Telehealth: Payer: Self-pay | Admitting: Internal Medicine

## 2021-06-14 NOTE — Telephone Encounter (Signed)
Verification filed for Prolia on 06/05/21 checked portal verification was on hold so called [employer] the portal should be back up today an express authorization in process.

## 2021-06-19 ENCOUNTER — Ambulatory Visit (INDEPENDENT_AMBULATORY_CARE_PROVIDER_SITE_OTHER): Payer: PPO

## 2021-06-19 VITALS — Ht 72.0 in | Wt 162.0 lb

## 2021-06-19 DIAGNOSIS — L820 Inflamed seborrheic keratosis: Secondary | ICD-10-CM | POA: Diagnosis not present

## 2021-06-19 DIAGNOSIS — Z0000 Encounter for general adult medical examination without abnormal findings: Secondary | ICD-10-CM | POA: Diagnosis not present

## 2021-06-19 DIAGNOSIS — L570 Actinic keratosis: Secondary | ICD-10-CM | POA: Diagnosis not present

## 2021-06-19 DIAGNOSIS — R208 Other disturbances of skin sensation: Secondary | ICD-10-CM | POA: Diagnosis not present

## 2021-06-19 DIAGNOSIS — L538 Other specified erythematous conditions: Secondary | ICD-10-CM | POA: Diagnosis not present

## 2021-06-19 NOTE — Patient Instructions (Addendum)
Tony Luna , Thank you for taking time to come for your Medicare Wellness Visit. I appreciate your ongoing commitment to your health goals. Please review the following plan we discussed and let me know if I can assist you in the future.   These are the goals we discussed:  Goals      Exercise 150 minutes per week (moderate activity)        This is a list of the screening recommended for you and due dates:  Health Maintenance  Topic Date Due   COVID-19 Vaccine (4 - Booster for Pfizer series) 07/05/2021*   Zoster (Shingles) Vaccine (1 of 2) 09/17/2021*   Colon Cancer Screening  04/18/2022   Tetanus Vaccine  06/07/2027   Pneumonia Vaccine  Completed   Flu Shot  Completed   Hepatitis C Screening: USPSTF Recommendation to screen - Ages 18-79 yo.  Completed   HPV Vaccine  Aged Out  *Topic was postponed. The date shown is not the original due date.    Advanced directives: on file  Conditions/risks identified: none new  Follow up in one year for your annual wellness visit.   Preventive Care 79 Years and Older, Male Preventive care refers to lifestyle choices and visits with your health care provider that can promote health and wellness. What does preventive care include? A yearly physical exam. This is also called an annual well check. Dental exams once or twice a year. Routine eye exams. Ask your health care provider how often you should have your eyes checked. Personal lifestyle choices, including: Daily care of your teeth and gums. Regular physical activity. Eating a healthy diet. Avoiding tobacco and drug use. Limiting alcohol use. Practicing safe sex. Taking low doses of aspirin every day. Taking vitamin and mineral supplements as recommended by your health care provider. What happens during an annual well check? The services and screenings done by your health care provider during your annual well check will depend on your age, overall health, lifestyle risk factors, and  family history of disease. Counseling  Your health care provider may ask you questions about your: Alcohol use. Tobacco use. Drug use. Emotional well-being. Home and relationship well-being. Sexual activity. Eating habits. History of falls. Memory and ability to understand (cognition). Work and work Statistician. Screening  You may have the following tests or measurements: Height, weight, and BMI. Blood pressure. Lipid and cholesterol levels. These may be checked every 5 years, or more frequently if you are over 80 years old. Skin check. Lung cancer screening. You may have this screening every year starting at age 58 if you have a 30-pack-year history of smoking and currently smoke or have quit within the past 15 years. Fecal occult blood test (FOBT) of the stool. You may have this test every year starting at age 86. Flexible sigmoidoscopy or colonoscopy. You may have a sigmoidoscopy every 5 years or a colonoscopy every 10 years starting at age 35. Prostate cancer screening. Recommendations will vary depending on your family history and other risks. Hepatitis C blood test. Hepatitis B blood test. Sexually transmitted disease (STD) testing. Diabetes screening. This is done by checking your blood sugar (glucose) after you have not eaten for a while (fasting). You may have this done every 1-3 years. Abdominal aortic aneurysm (AAA) screening. You may need this if you are a current or former smoker. Osteoporosis. You may be screened starting at age 55 if you are at high risk. Talk with your health care provider about your test results,  treatment options, and if necessary, the need for more tests. Vaccines  Your health care provider may recommend certain vaccines, such as: Influenza vaccine. This is recommended every year. Tetanus, diphtheria, and acellular pertussis (Tdap, Td) vaccine. You may need a Td booster every 10 years. Zoster vaccine. You may need this after age 60. Pneumococcal  13-valent conjugate (PCV13) vaccine. One dose is recommended after age 100. Pneumococcal polysaccharide (PPSV23) vaccine. One dose is recommended after age 35. Talk to your health care provider about which screenings and vaccines you need and how often you need them. This information is not intended to replace advice given to you by your health care provider. Make sure you discuss any questions you have with your health care provider. Document Released: 06/10/2015 Document Revised: 02/01/2016 Document Reviewed: 03/15/2015 Elsevier Interactive Patient Education  2017 [place] Prevention in the Home Falls can cause injuries. They can happen to people of all ages. There are many things you can do to make your home safe and to help prevent falls. What can I do on the outside of my home? Regularly fix the edges of walkways and driveways and fix any cracks. Remove anything that might make you trip as you walk through a door, such as a raised step or threshold. Trim any bushes or trees on the path to your home. Use bright outdoor lighting. Clear any walking paths of anything that might make someone trip, such as rocks or tools. Regularly check to see if handrails are loose or broken. Make sure that both sides of any steps have handrails. Any raised decks and porches should have guardrails on the edges. Have any leaves, snow, or ice cleared regularly. Use sand or salt on walking paths during winter. Clean up any spills in your garage right away. This includes oil or grease spills. What can I do in the bathroom? Use night lights. Install grab bars by the toilet and in the tub and shower. Do not use towel bars as grab bars. Use non-skid mats or decals in the tub or shower. If you need to sit down in the shower, use a plastic, non-slip stool. Keep the floor dry. Clean up any water that spills on the floor as soon as it happens. Remove soap buildup in the tub or shower regularly. Attach  bath mats securely with double-sided non-slip rug tape. Do not have throw rugs and other things on the floor that can make you trip. What can I do in the bedroom? Use night lights. Make sure that you have a light by your bed that is easy to reach. Do not use any sheets or blankets that are too big for your bed. They should not hang down onto the floor. Have a firm chair that has side arms. You can use this for support while you get dressed. Do not have throw rugs and other things on the floor that can make you trip. What can I do in the kitchen? Clean up any spills right away. Avoid walking on wet floors. Keep items that you use a lot in easy-to-reach places. If you need to reach something above you, use a strong step stool that has a grab bar. Keep electrical cords out of the way. Do not use floor polish or wax that makes floors slippery. If you must use wax, use non-skid floor wax. Do not have throw rugs and other things on the floor that can make you trip. What can I do with my stairs? Do  not leave any items on the stairs. Make sure that there are handrails on both sides of the stairs and use them. Fix handrails that are broken or loose. Make sure that handrails are as long as the stairways. Check any carpeting to make sure that it is firmly attached to the stairs. Fix any carpet that is loose or worn. Avoid having throw rugs at the top or bottom of the stairs. If you do have throw rugs, attach them to the floor with carpet tape. Make sure that you have a light switch at the top of the stairs and the bottom of the stairs. If you do not have them, ask someone to add them for you. What else can I do to help prevent falls? Wear shoes that: Do not have high heels. Have rubber bottoms. Are comfortable and fit you well. Are closed at the toe. Do not wear sandals. If you use a stepladder: Make sure that it is fully opened. Do not climb a closed stepladder. Make sure that both sides of the  stepladder are locked into place. Ask someone to hold it for you, if possible. Clearly mark and make sure that you can see: Any grab bars or handrails. First and last steps. Where the edge of each step is. Use tools that help you move around (mobility aids) if they are needed. These include: Canes. Walkers. Scooters. Crutches. Turn on the lights when you go into a dark area. Replace any light bulbs as soon as they burn out. Set up your furniture so you have a clear path. Avoid moving your furniture around. If any of your floors are uneven, fix them. If there are any pets around you, be aware of where they are. Review your medicines with your doctor. Some medicines can make you feel dizzy. This can increase your chance of falling. Ask your doctor what other things that you can do to help prevent falls. This information is not intended to replace advice given to you by your health care provider. Make sure you discuss any questions you have with your health care provider. Document Released: 03/10/2009 Document Revised: 10/20/2015 Document Reviewed: 06/18/2014 Elsevier Interactive Patient Education  2017 [employer].

## 2021-06-19 NOTE — Progress Notes (Addendum)
Subjective:   Tony Luna is a 81 y.o. male who presents for Medicare Annual/Subsequent preventive examination.  Review of Systems    No ROS.  Medicare Wellness Virtual Visit.  Visual/audio telehealth visit, UTA vital signs.   See social history for additional risk factors.   Cardiac Risk Factors include: advanced age (>98men, >41 women);male gender;hypertension     Objective:    Today's Vitals   06/19/21 1404  Weight: 162 lb (73.5 kg)  Height: 6' (1.829 m)   Body mass index is 21.97 kg/m.  Advanced Directives 06/19/2021 04/18/2021 03/21/2021 02/21/2021 06/16/2020 06/15/2019 06/09/2018  Does Patient Have a Medical Advance Directive? Yes Yes Yes Yes Yes Yes Yes  Type of Paramedic of [place];Living will [place];Living will Living will [place];Living will [place];Living will [place];Living will [place];Living will;Out of facility DNR (pink MOST or yellow form)  Does patient want to make changes to medical advance directive? No - Patient declined - - No - Patient declined No - Patient declined No - Patient declined No - Patient declined  Copy of [place] in Chart? Yes - validated most recent copy scanned in chart (See row information) - - Yes - validated most recent copy scanned in chart (See row information) Yes - validated most recent copy scanned in chart (See row information) Yes - validated most recent copy scanned in chart (See row information) Yes - validated most recent copy scanned in chart (See row information)   Current Medications (verified) Outpatient Encounter Medications as of 06/19/2021  Medication Sig   anastrozole (ARIMIDEX) 1 MG tablet Take 1 mg by mouth once a week.   Boron 6 MG TABS Take by mouth daily.   MILK THISTLE PO Take 1 tablet by mouth 2 (two) times daily.   Multiple Vitamin (MULTIVITAMIN WITH MINERALS) TABS  tablet Take 1 tablet by mouth 2 (two) times daily.   NON FORMULARY Sharp Thought Takes 2 capsule bid.   NP THYROID 60 MG tablet TAKE 1 TABLET BY MOUTH DAILY   OVER THE COUNTER MEDICATION Take 4 sprays by mouth at bedtime. Secretropin oral spray - growth hormone   OVER THE COUNTER MEDICATION Take 1 capsule by mouth 3 (three) times daily. Instaflex   OVER THE COUNTER MEDICATION Take 1,000 mg by mouth daily. Turmeric with Meriva   OVER THE COUNTER MEDICATION Take 250 mg by mouth daily. Bio Magnesium Citrate   OVER THE COUNTER MEDICATION Take 20,000 mcg by mouth daily. Vitamin B12-methylfolate   OVER THE COUNTER MEDICATION Take 800 mcg by mouth daily. Active B Folate   OVER THE COUNTER MEDICATION 2 tablets daily. VitaPrime   OVER THE COUNTER MEDICATION 1,000 mg daily. Super Bio-C buffered   OVER THE COUNTER MEDICATION 1,000 mg daily. L'Arginine   OVER THE COUNTER MEDICATION 2 capsules 2 (two) times daily. SLF Forte   OVER THE COUNTER MEDICATION 1,000 mg. Myomin   Saw Palmetto 160 MG TABS    sildenafil (VIAGRA) 50 MG tablet USE AS DIRECTED   tadalafil (CIALIS) 20 MG tablet TAKE ONE TABLET DAILY AS NEEDED FOR ERECTILE DYSFUNCTION   tamsulosin (FLOMAX) 0.4 MG CAPS capsule TAKE 1 CAPSULE BY MOUTH ONCE DAILY   TAURINE PO Take 1,000 mg by mouth daily.    Testosterone 75 MG PLLT by Implant route.   zolpidem (AMBIEN) 5 MG tablet TAKE 1/2 TO 1 TABLET AT BEDTIME AS NEEDED FOR SLEEP  No facility-administered encounter medications on file as of 06/19/2021.    Allergies (verified) Patient has no known allergies.   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 something told by reg dr  unable to say what it was   Esophageal tear    Hypothyroidism    Pneumonia    hx   Primary osteoarthritis of left knee 06/22/2014   Rotator cuff tear    Past Surgical History:  Procedure  Laterality Date   BACK SURGERY     blepharaplasty     CATARACT EXTRACTION W/PHACO Left 02/21/2021   Procedure: CATARACT EXTRACTION PHACO AND INTRAOCULAR LENS PLACEMENT ([place]) LEFT 4.56 00:34.9;  Surgeon: Birder Robson, MD;  Location: [facility];  Service: Ophthalmology;  Laterality: Left;   CAUTERY OF TURBINATES     COLONOSCOPY WITH PROPOFOL N/A 07/19/2015   Procedure: COLONOSCOPY WITH PROPOFOL;  Surgeon: [name], MD;  Location: [facility] ENDOSCOPY;  Service: Endoscopy;  Laterality: N/A;   COLONOSCOPY WITH PROPOFOL N/A 03/21/2021   Procedure: COLONOSCOPY WITH PROPOFOL;  Surgeon: [name], MD;  [facility]COPY;  Service: Endoscopy;  Laterality: N/A;   COLONOSCOPY WITH PROPOFOL N/A 04/18/2021   Procedure: COLONOSCOPY WITH PROPOFOL;  Surgeon: [name], MD;  Location: [facility] ENDOSCOPY;  Service: Endoscopy;  Laterality: N/A;   EYE SURGERY     HERNIA REPAIR Left    inguinal herniorrhapy   KNEE SURGERY     ? 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   Procedure: TOTAL KNEE ARTHROPLASTY;  Surgeon: [name], MD;  Location: [facility];  Service: Orthopedics;  Laterality: Left;   Family History  Problem Relation Age of Onset   Alcohol abuse Mother    Diabetes Mother    Mental retardation Mother    Social History   Socioeconomic History   Marital status: Married    Spouse name: Not on file   Number of children: 2   Years of education: Not on file   Highest education level: Not on file  Occupational History   Occupation: textiles  Tobacco Use   Smoking status: Former    Packs/day: 1.00    Years: 15.00    Pack years: 15.00    Types: Cigarettes    Quit date: 06/15/1983    Years since quitting: 38.0   Smokeless tobacco: Current    Types: Chew  Vaping Use   Vaping Use: Never used  Substance and Sexual Activity   Alcohol use: Yes    Alcohol/week: 10.0  standard drinks    Types: 10 Glasses of wine per week   Drug use: No   Sexual activity: Not Currently  Other Topics Concern   Not on file  Social History Narrative   [employer] graduate   [place]   Very active   Walks 1 hour daily   Social Determinants of Health   Financial Resource Strain: Low Risk    Difficulty of Paying Living Expenses: Not hard at all  Food Insecurity: No Food Insecurity   Worried About Charity fundraiser in the Last Year: Never true   [place] in the Last Year: Never true  Transportation Needs: No Transportation Needs  Lack of Transportation (Medical): No   Lack of Transportation (Non-Medical): No  Physical Activity: Sufficiently Active   Days of Exercise per Week: 7 days   Minutes of Exercise per Session: 60 min  Stress: No Stress Concern Present   Feeling of Stress : Not at all  Social Connections: Socially Integrated   Frequency of Communication with Friends and Family: More than three times a week   Frequency of Social Gatherings with Friends and Family: More than three times a week   Attends Religious Services: 1 to 4 times per year   Active Member of [employer] or Organizations: Yes   Attends Archivist Meetings: 1 to 4 times per year   Marital Status: Married    Tobacco Counseling Ready to quit: Not Answered Counseling given: Not Answered   Clinical Intake:  Pre-visit preparation completed: Yes        Diabetes: No  How often do you need to have someone help you when you read instructions, pamphlets, or other written materials from your doctor or pharmacy?: 1 - Never   Interpreter Needed?: No      Activities of Daily Living In your present state of health, do you have any difficulty performing the following activities: 06/19/2021 02/21/2021  Hearing? Y N  Comment Hearing aids -  Vision? N N  Difficulty concentrating or making decisions? N N  Walking or climbing stairs? N N  Dressing or bathing? N N  Doing  errands, shopping? N -  Preparing Food and eating ? N -  Using the Toilet? N -  In the past six months, have you accidently leaked urine? N -  Comment Followed by Urology -  Do you have problems with loss of bowel control? N -  Managing your Medications? N -  Managing your Finances? N -  Housekeeping or managing your Housekeeping? N -  Some recent data might be hidden   Patient Care Team: [name], MD as PCP - General (Internal Medicine) Kate Sable, MD as PCP - Cardiology (Cardiology)  Indicate any recent Medical Services you may have received from other than [facility] providers in the past year (date may be approximate).     Assessment:   This is a routine wellness examination for [place].  Virtual Visit via Telephone Note  I connected with  Imagene Riches on 06/19/21 at  2:00 PM EST by telephone and verified that I am speaking with the correct person using two identifiers.  Persons participating in the virtual visit: patient/Nurse Health Advisor   I discussed the limitations, risks, security and privacy concerns of performing an evaluation and management service by telephone and the availability of in person appointments. The patient expressed understanding and agreed to proceed.  Interactive audio and video telecommunications were attempted between this nurse and patient, however failed, due to patient having technical difficulties OR patient did not have access to video capability.  We continued and completed visit with audio only.  Some vital signs may be absent or patient reported.   Hearing/Vision screen Hearing Screening - Comments:: Hearing aids  Dietary issues and exercise activities discussed: Current Exercise Habits: Home exercise routine, Type of exercise: strength training/weights;calisthenics (Physical Therapy), Time (Minutes): 60, Frequency (Times/Week): 6, Weekly Exercise (Minutes/Week): 360, Intensity: Mild  Regular diet    Goals Addressed                This Visit's Progress     Patient Stated     COMPLETED: DIET -  INCREASE WATER INTAKE (pt-stated)        Stay hydrated      Other     Exercise 150 minutes per week (moderate activity)         Depression Screen PHQ 2/9 Scores 06/19/2021 01/31/2021 06/16/2020 06/15/2019 06/09/2018 04/26/2018 06/06/2017  PHQ - 2 Score 0 0 0 0 0 0 0  PHQ- 9 Score - - - - - - 0    Fall Risk Fall Risk  06/19/2021 01/31/2021 06/16/2020 03/31/2020 11/04/2019  Falls in the past year? 0 1 0 1 1  Number falls in past yr: 0 0 0 0 0  Injury with Fall? - 1 0 1 1  Risk for fall due to : - History of fall(s) - - -  Follow up Falls evaluation completed Falls evaluation completed Falls evaluation completed Falls evaluation completed Falls evaluation completed   [place]: Home free of loose throw rugs in walkways, pet beds, electrical cords, etc? Yes  Adequate lighting in your home to reduce risk of falls? Yes   ASSISTIVE DEVICES UTILIZED TO PREVENT FALLS: Life alert? No  Use of a cane, walker or w/c? No   TIMED UP AND GO: Was the test performed? No .   Cognitive Function: Patient is alert and oriented x3.  MMSE - Mini Mental State Exam 06/15/2019  Not completed: Unable to complete     6CIT Screen 06/09/2018 06/06/2017 05/29/2016  What Year? 0 points 0 points 0 points  What month? 0 points 0 points 0 points  What time? 0 points 0 points 0 points  Count back from 20 0 points 0 points 0 points  Months in reverse 0 points 0 points 0 points  Repeat phrase 0 points 0 points 0 points  Total Score 0 0 0    Immunizations Immunization History  Administered Date(s) Administered   Fluad Quad(high Dose 65+) 04/27/2019, 03/31/2020, 01/31/2021   Hep A / Hep B 12/13/2015, 01/17/2016, 06/26/2016   Influenza Whole 02/26/2008   Influenza, High Dose Seasonal PF 04/23/2018   Influenza,inj,Quad PF,6+ Mos 03/12/2013, 02/24/2014   Influenza-Unspecified 02/09/2016   PFIZER(Purple  Top)SARS-COV-2 Vaccination 06/24/2019, 07/15/2019, 04/28/2020   PNEUMOCOCCAL CONJUGATE-20 01/31/2021   Pneumococcal Conjugate-13 08/06/2013   Pneumococcal Polysaccharide-23 05/24/2011   Td 05/28/2004   Tdap 02/13/2006, 06/06/2017   Zoster, Live 05/30/2007    Shingrix Completed?: No.    Education has been provided regarding the importance of this vaccine. Patient has been advised to call insurance company to determine out of pocket expense if they have not yet received this vaccine. Advised may also receive vaccine at local pharmacy or Health Dept. Verbalized acceptance and understanding.  Screening Tests Health Maintenance  Topic Date Due   COVID-19 Vaccine (4 - Booster for Pfizer series) 07/05/2021 (Originally 06/23/2020)   Zoster Vaccines- Shingrix (1 of 2) 09/17/2021 (Originally 10/19/1959)   COLONOSCOPY (Pts 45-68yrs Insurance coverage will need to be confirmed)  04/18/2022   TETANUS/TDAP  06/07/2027   Pneumonia Vaccine 7+ Years old  Completed   INFLUENZA VACCINE  Completed   Hepatitis C Screening  Completed   HPV VACCINES  Aged Out   Health Maintenance There are no preventive care reminders to display for this patient.  Vision Screening: Recommended annual ophthalmology exams for early detection of glaucoma and other disorders of the eye.  Dental Screening: Recommended annual dental exams for proper oral hygiene  Community Resource Referral / Chronic Care Management: CRR required this visit?  No  CCM required this visit?  No      Plan:   Keep all routine maintenance appointments.   I have personally reviewed and noted the following in the patients chart:   Medical and social history Use of alcohol, tobacco or illicit drugs  Current medications and supplements including opioid prescriptions. Patient is not currently taking opioid prescriptions. Functional ability and status Nutritional status Physical activity Advanced directives List of other  physicians Hospitalizations, surgeries, and ER visits in previous 12 months Vitals Screenings to include cognitive, depression, and falls Referrals and appointments  In addition, I have reviewed and discussed with patient certain preventive protocols, quality metrics, and best practice recommendations. A written personalized care plan for preventive services as well as general preventive health recommendations were provided to patient.     OBrien-Blaney, Mans[place] Dann L, LPN   [id number]     I have reviewed the above information and agree with above.   Deborra Medina, MD

## 2021-06-27 ENCOUNTER — Other Ambulatory Visit: Payer: Self-pay

## 2021-06-27 ENCOUNTER — Ambulatory Visit
Admission: RE | Admit: 2021-06-27 | Discharge: 2021-06-27 | Disposition: A | Discharge status: Home / Self Care | Payer: PPO | Source: Ambulatory Visit | Attending: Urology | Admitting: Urology

## 2021-06-27 DIAGNOSIS — Z79890 Hormone replacement therapy (postmenopausal): Secondary | ICD-10-CM | POA: Diagnosis not present

## 2021-06-27 DIAGNOSIS — R319 Hematuria, unspecified: Secondary | ICD-10-CM | POA: Diagnosis not present

## 2021-06-27 DIAGNOSIS — E291 Testicular hypofunction: Secondary | ICD-10-CM | POA: Diagnosis not present

## 2021-06-27 DIAGNOSIS — R3129 Other microscopic hematuria: Secondary | ICD-10-CM | POA: Diagnosis not present

## 2021-06-27 DIAGNOSIS — E038 Other specified hypothyroidism: Secondary | ICD-10-CM | POA: Diagnosis not present

## 2021-06-27 DIAGNOSIS — N200 Calculus of kidney: Secondary | ICD-10-CM | POA: Diagnosis not present

## 2021-06-27 LAB — POCT I-STAT CREATININE: Creatinine, Ser: 1.3 mg/dL — ABNORMAL HIGH (ref 0.61–1.24)

## 2021-06-27 MED ORDER — IOHEXOL 300 MG/ML  SOLN
100.0000 mL | Freq: Once | INTRAMUSCULAR | Status: AC | PRN
  Administered 2021-06-27: 100 mL via INTRAVENOUS

## 2021-06-29 DIAGNOSIS — E291 Testicular hypofunction: Secondary | ICD-10-CM | POA: Diagnosis not present

## 2021-06-29 DIAGNOSIS — Z6822 Body mass index (BMI) 22.0-22.9, adult: Secondary | ICD-10-CM | POA: Diagnosis not present

## 2021-06-29 DIAGNOSIS — G479 Sleep disorder, unspecified: Secondary | ICD-10-CM | POA: Diagnosis not present

## 2021-06-29 DIAGNOSIS — R6882 Decreased libido: Secondary | ICD-10-CM | POA: Diagnosis not present

## 2021-06-29 DIAGNOSIS — E038 Other specified hypothyroidism: Secondary | ICD-10-CM | POA: Diagnosis not present

## 2021-06-29 DIAGNOSIS — N529 Male erectile dysfunction, unspecified: Secondary | ICD-10-CM | POA: Diagnosis not present

## 2021-07-03 ENCOUNTER — Telehealth: Payer: Self-pay | Admitting: *Deleted

## 2021-07-03 NOTE — Telephone Encounter (Signed)
-----   Message from Abbie Sons, MD sent at 07/02/2021 10:16 AM EST ----- CT shows a tiny stone in the left kidney.  There is prostate enlargement and bladder wall thickening.  Cystoscopy recommended to make sure there is no evidence of bladder tumor/cancer

## 2021-07-04 NOTE — Telephone Encounter (Signed)
Patient returned our call.  Cystoscopy scheduled with Dr. Bernardo Heater for 07/14/21.  Patient expressed understanding.

## 2021-07-13 IMAGING — DX DG CHEST 2V
2 series · 2 of 2 positions shown · non-contrast
Comparison: None.

CLINICAL DATA: Preoperative evaluation.

EXAM:
CHEST - 2 VIEW

[chest pa]
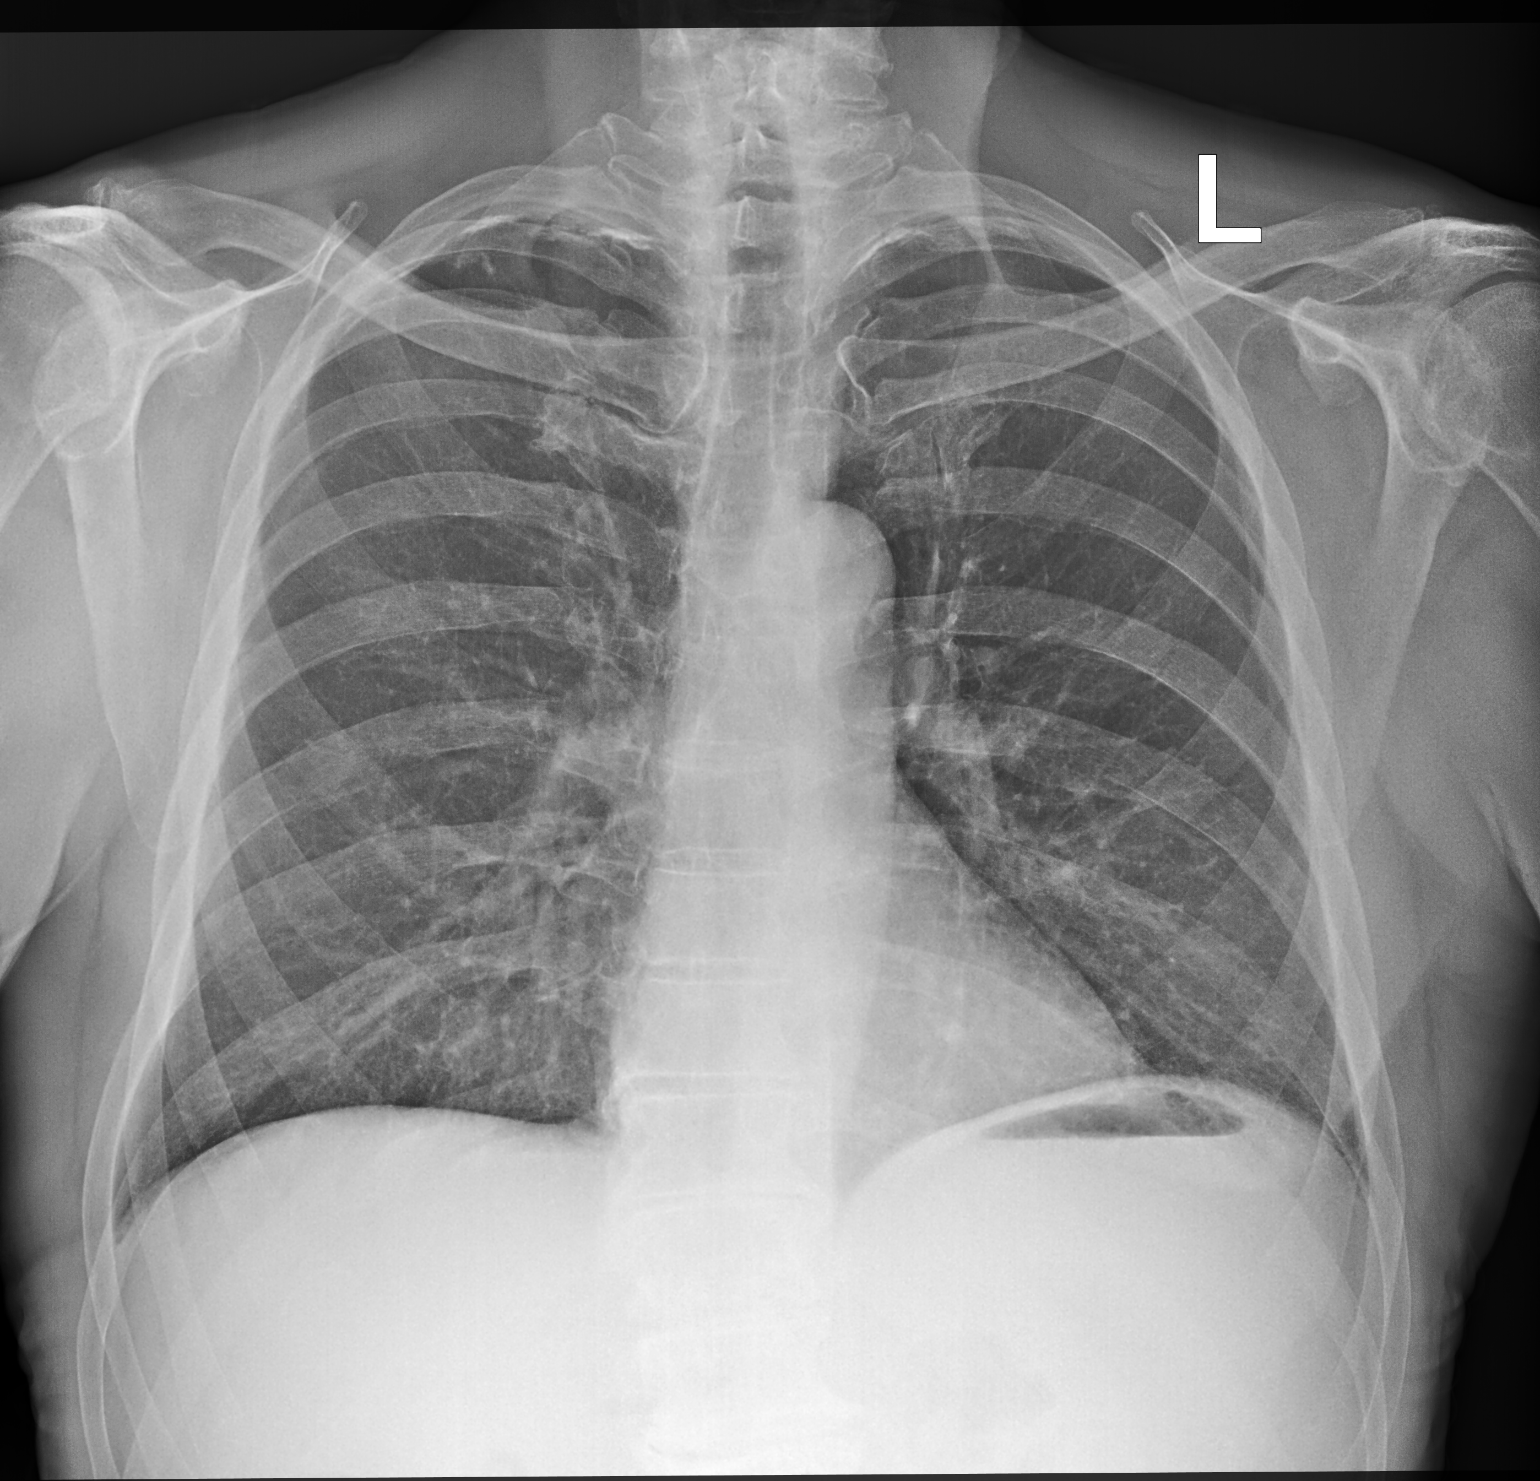

[chest lat]
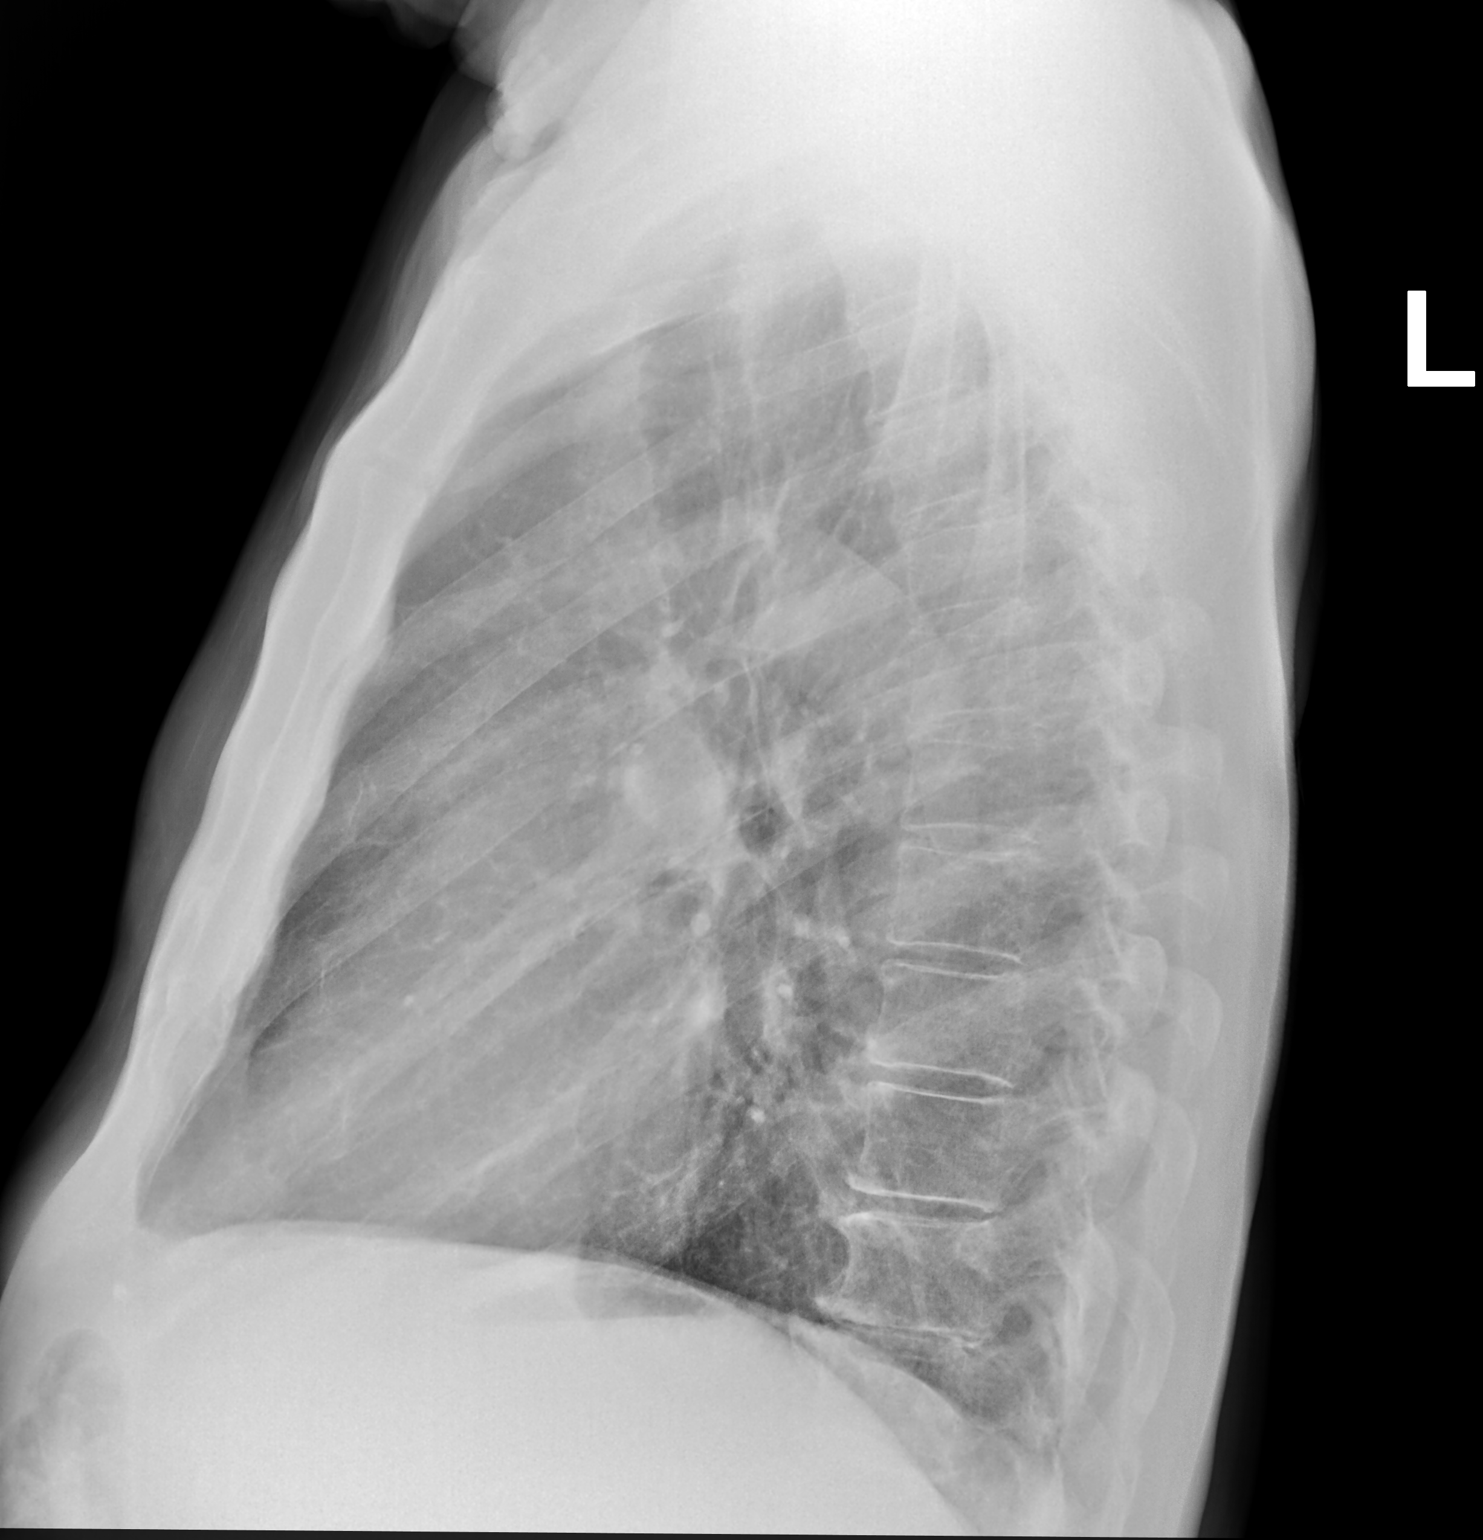

[2 of 2 positions shown; findings below may reference images not displayed]

FINDINGS: The cardiomediastinal contours are normal. Mild biapical
pleuroparenchymal scarring. Pulmonary vasculature is normal. No
consolidation, pleural effusion, or pneumothorax. No acute osseous
abnormalities are seen.
IMPRESSION: No acute chest findings.  Mild biapical pleuroparenchymal scarring.

## 2021-07-14 ENCOUNTER — Ambulatory Visit: Payer: PPO | Admitting: Urology

## 2021-07-14 ENCOUNTER — Other Ambulatory Visit: Payer: Self-pay

## 2021-07-14 ENCOUNTER — Encounter: Payer: Self-pay | Admitting: Urology

## 2021-07-14 VITALS — BP 154/71 | HR 87 | Ht 72.0 in | Wt 163.0 lb

## 2021-07-14 DIAGNOSIS — N138 Other obstructive and reflux uropathy: Secondary | ICD-10-CM

## 2021-07-14 DIAGNOSIS — N401 Enlarged prostate with lower urinary tract symptoms: Secondary | ICD-10-CM

## 2021-07-14 LAB — URINALYSIS, COMPLETE
Bilirubin, UA: NEGATIVE
Glucose, UA: NEGATIVE
Leukocytes,UA: NEGATIVE
Nitrite, UA: NEGATIVE
Specific Gravity, UA: >1.03 — ABNORMAL HIGH (ref 1.005–1.030)
Urobilinogen, Ur: 0.2 mg/dL (ref 0.2–1.0)
pH, UA: 6 (ref 5.0–7.5)

## 2021-07-14 LAB — MICROSCOPIC EXAMINATION

## 2021-07-14 NOTE — Progress Notes (Signed)
° °  07/14/21  CC:  Chief Complaint  Patient presents with   Cysto    HPI: Incidentally found to have microhematuria on annual follow-up 04/07/2021.  CTU with nonobstructing 3 mm left renal calculus.  UA today 3-10 RBC  See rooming tab for vitals NED. A&Ox3.   No respiratory distress   Abd soft, NT, ND Normal phallus with bilateral descended testicles  Cystoscopy Procedure Note  Patient identification was confirmed, informed consent was obtained, and patient was prepped using Betadine solution.  Lidocaine jelly was administered per urethral meatus.     Pre-Procedure: - Inspection reveals a normal caliber urethral meatus.  Procedure: The flexible cystoscope was introduced without difficulty - No urethral strictures/lesions are present. -  Moderate lateral lobe enlargement  prostate; hypervascularity/calculi -  Mild elevation  bladder neck - Bilateral ureteral orifices identified - Bladder mucosa  reveals no ulcers, tumors, or lesions - No bladder stones - Moderate trabeculation with scattered diverticula  Retroflexion shows no intravesical median lobe or lesions   Post-Procedure: - Patient tolerated the procedure well  Assessment/ Plan: No bladder mucosal abnormality/tumor BPH most likely source of hematuria Small left renal calculus Continue annual November follow-up KUB on follow-up    Abbie Sons, MD

## 2021-07-21 DIAGNOSIS — U071 COVID-19: Secondary | ICD-10-CM | POA: Diagnosis not present

## 2021-07-31 ENCOUNTER — Ambulatory Visit (INDEPENDENT_AMBULATORY_CARE_PROVIDER_SITE_OTHER): Payer: PPO | Admitting: Internal Medicine

## 2021-07-31 ENCOUNTER — Other Ambulatory Visit: Payer: Self-pay

## 2021-07-31 ENCOUNTER — Encounter: Payer: Self-pay | Admitting: Internal Medicine

## 2021-07-31 VITALS — BP 138/68 | HR 42 | Temp 98.0°F | Ht 72.0 in | Wt 165.0 lb

## 2021-07-31 DIAGNOSIS — D696 Thrombocytopenia, unspecified: Secondary | ICD-10-CM

## 2021-07-31 DIAGNOSIS — E785 Hyperlipidemia, unspecified: Secondary | ICD-10-CM | POA: Diagnosis not present

## 2021-07-31 DIAGNOSIS — K7460 Unspecified cirrhosis of liver: Secondary | ICD-10-CM

## 2021-07-31 DIAGNOSIS — Z125 Encounter for screening for malignant neoplasm of prostate: Secondary | ICD-10-CM

## 2021-07-31 DIAGNOSIS — M810 Age-related osteoporosis without current pathological fracture: Secondary | ICD-10-CM | POA: Diagnosis not present

## 2021-07-31 DIAGNOSIS — G4700 Insomnia, unspecified: Secondary | ICD-10-CM | POA: Diagnosis not present

## 2021-07-31 DIAGNOSIS — D126 Benign neoplasm of colon, unspecified: Secondary | ICD-10-CM | POA: Diagnosis not present

## 2021-07-31 DIAGNOSIS — N401 Enlarged prostate with lower urinary tract symptoms: Secondary | ICD-10-CM | POA: Diagnosis not present

## 2021-07-31 DIAGNOSIS — D692 Other nonthrombocytopenic purpura: Secondary | ICD-10-CM | POA: Diagnosis not present

## 2021-07-31 DIAGNOSIS — R7301 Impaired fasting glucose: Secondary | ICD-10-CM | POA: Diagnosis not present

## 2021-07-31 DIAGNOSIS — N138 Other obstructive and reflux uropathy: Secondary | ICD-10-CM | POA: Diagnosis not present

## 2021-07-31 DIAGNOSIS — N529 Male erectile dysfunction, unspecified: Secondary | ICD-10-CM

## 2021-07-31 LAB — MICROALBUMIN / CREATININE URINE RATIO
Creatinine,U: 97.5 mg/dL
Microalb Creat Ratio: 3.1 mg/g (ref 0.0–30.0)
Microalb, Ur: 3 mg/dL — ABNORMAL HIGH (ref 0.0–1.9)

## 2021-07-31 LAB — COMPREHENSIVE METABOLIC PANEL
ALT: 34 U/L (ref 0–53)
AST: 39 U/L — ABNORMAL HIGH (ref 0–37)
Albumin: 4.3 g/dL (ref 3.5–5.2)
Alkaline Phosphatase: 89 U/L (ref 39–117)
BUN: 19 mg/dL (ref 6–23)
CO2: 31 mEq/L (ref 19–32)
Calcium: 9.9 mg/dL (ref 8.4–10.5)
Chloride: 102 mEq/L (ref 96–112)
Creatinine, Ser: 1.13 mg/dL (ref 0.40–1.50)
GFR: 62.14 mL/min (ref >60.00)
Glucose, Bld: 103 mg/dL — ABNORMAL HIGH (ref 70–99)
Potassium: 4.2 mEq/L (ref 3.5–5.1)
Sodium: 143 mEq/L (ref 135–145)
Total Bilirubin: 1.5 mg/dL — ABNORMAL HIGH (ref 0.2–1.2)
Total Protein: 6.5 g/dL (ref 6.0–8.3)

## 2021-07-31 LAB — LIPID PANEL
Cholesterol: 170 mg/dL (ref 0–200)
HDL: 86.4 mg/dL (ref >39.00)
LDL Cholesterol: 75 mg/dL (ref 0–99)
NonHDL: 83.56
Total CHOL/HDL Ratio: 2
Triglycerides: 43 mg/dL (ref 0.0–149.0)
VLDL: 8.6 mg/dL (ref 0.0–40.0)

## 2021-07-31 LAB — CBC WITH DIFFERENTIAL/PLATELET
Basophils Absolute: 0 K/uL (ref 0.0–0.1)
Basophils Relative: 0.7 % (ref 0.0–3.0)
Eosinophils Absolute: 0.1 K/uL (ref 0.0–0.7)
Eosinophils Relative: 1.9 % (ref 0.0–5.0)
HCT: 39.8 % (ref 39.0–52.0)
Hemoglobin: 14 g/dL (ref 13.0–17.0)
Lymphocytes Relative: 19.8 % (ref 12.0–46.0)
Lymphs Abs: 0.8 K/uL (ref 0.7–4.0)
MCHC: 35.1 g/dL (ref 30.0–36.0)
MCV: 98 fl (ref 78.0–100.0)
Monocytes Absolute: 0.3 K/uL (ref 0.1–1.0)
Monocytes Relative: 8.5 % (ref 3.0–12.0)
Neutro Abs: 2.7 K/uL (ref 1.4–7.7)
Neutrophils Relative %: 69.1 % (ref 43.0–77.0)
Platelets: 148 K/uL — ABNORMAL LOW (ref 150.0–400.0)
RBC: 4.06 Mil/uL — ABNORMAL LOW (ref 4.22–5.81)
RDW: 12.7 % (ref 11.5–15.5)
WBC: 3.8 K/uL — ABNORMAL LOW (ref 4.0–10.5)

## 2021-07-31 LAB — TSH: TSH: 2.36 u[IU]/mL (ref 0.35–5.50)

## 2021-07-31 LAB — HEMOGLOBIN A1C: Hgb A1c MFr Bld: 5.1 % (ref 4.6–6.5)

## 2021-07-31 LAB — PSA, MEDICARE: PSA: 1.06 ng/ml (ref 0.10–4.00)

## 2021-07-31 MED ORDER — DOXYCYCLINE HYCLATE 100 MG PO TABS: 100.0000 mg | ORAL_TABLET | Freq: Two times a day (BID) | ORAL | 2 refills | Status: DC

## 2021-07-31 MED ORDER — ZOLPIDEM TARTRATE ER 6.25 MG PO TBCR: 6.2500 mg | EXTENDED_RELEASE_TABLET | Freq: Every evening | ORAL | 1 refills | Status: DC | PRN

## 2021-07-31 NOTE — Assessment & Plan Note (Signed)
Found on Nov 2022 colonoscopy [facility]).  Repeat due in one year.  ?

## 2021-07-31 NOTE — Progress Notes (Signed)
Patient ID: Tony Luna, male    DOB: 09-08-1940  Age: 81 y.o. MRN: [id number]  The patient is here for follow up and  management of other chronic and acute problems.   The risk factors are reflected in the social history.  The roster of all physicians providing medical care to patient - is listed in the Snapshot section of the chart.  Activities of daily living:  The patient is 100% independent in all ADLs: dressing, toileting, feeding as well as independent mobility  Home safety : The patient has smoke detectors in the home. They wear seatbelts.  There are no firearms at home. There is no violence in the home.   There is no risks for hepatitis, STDs or HIV. There is no   history of blood transfusion. They have no travel history to infectious disease endemic areas of the world.  The patient has seen their dentist in the last six month. They have seen their eye doctor in the last year. They admit to slight hearing difficulty with regard to whispered voices and some television programs.  They have deferred audiologic testing in the last year.  They do not  have excessive sun exposure. Discussed the need for sun protection: hats, long sleeves and use of sunscreen if there is significant sun exposure.   Diet: the importance of a healthy diet is discussed. They do have a healthy diet.  The benefits of regular aerobic exercise were discussed. He exercises 5 days per week for 60 minutes daily    Depression screen: there are no signs or vegative symptoms of depression- irritability, change in appetite, anhedonia, sadness/tearfullness.  Cognitive assessment: the patient manages all their financial and personal affairs and is actively engaged. They could relate day,date,year and events; recalled 2/3 objects at 3 minutes; performed clock-face test normally.  The following portions of the patient's history were reviewed and updated as appropriate: allergies, current medications, past family history,  past medical history,  past surgical history, past social history  and problem list.  Visual acuity was not assessed per patient preference since she has regular follow up with her ophthalmologist. Hearing and body mass index were assessed and reviewed.   During the course of the visit the patient was educated and counseled about appropriate screening and preventive services including : fall prevention , diabetes screening, nutrition counseling, colorectal cancer screening, and recommended immunizations.    CC: The primary encounter diagnosis was Prostate cancer screening. Diagnoses of Impaired fasting glucose, Hyperlipidemia, unspecified hyperlipidemia type, Thrombocytopenia ([place]), Cirrhosis of liver without ascites, unspecified hepatic cirrhosis type ([place]), Purpura ([place]), Insomnia, unspecified type, Tubular adenoma of colon, Age-related osteoporosis without current pathological fracture, Benign prostatic hyperplasia with urinary obstruction, and ED (erectile dysfunction) of organic origin were also pertinent to this visit.  Feels great except for joint pain due to arthritis.  Going exercise 3 times per week.  Doing home pt for shoulders Sees [place] periodically   1) Cirrhosis:  asymptomatic.    2) osteoarthritis:  not bad enough to use meds  3) hypotestosteronism : using testosterone pellets and arimidex from [facility],  .  Uses cialis  5 days a week,  and prn viagra  4) left forearm pain , minor. PT suggested tendonitis  but no pain when elblow is supported  history of biceps tear never corrected surgically has a defect in biceps.   5) purpura on arms  and hands.  Wants vitamin  k checked.  Does not take aspirin.  No  bleeding  6) stress: due to wife's cognitive decline .  Seeing her on Friday with daughter coming as well.    Planning for long term with  purchase of [place] to provide care when he goes hunting .  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   He has a past surgical history that includes Knee surgery; Rotator cuff repair; Tonsillectomy; Spine surgery; Hernia repair (Left); Cautery of turbinates; blepharaplasty; Total knee arthroplasty (Left, 06/22/2014); Colonoscopy with propofol (N/A, 07/19/2015); Cataract extraction w/PHACO (Left, 02/21/2021); Colonoscopy with propofol (N/A, 03/21/2021); Eye surgery; Back surgery; and Colonoscopy with propofol (N/A, 04/18/2021).   His family history includes Alcohol abuse in his mother; Diabetes in his mother; Mental retardation in his mother.He reports that he quit smoking about 38 years ago. His smoking use included cigarettes. He has a 15.00 pack-year smoking history. His smokeless tobacco use includes chew. He reports current alcohol use of about 10.0 standard drinks per week. He reports that he does not use drugs.  Outpatient Medications Prior to Visit  Medication Sig Dispense Refill   anastrozole (ARIMIDEX) 1 MG tablet Take 1 mg by mouth once a week.     Boron 6 MG TABS Take by mouth daily.     Multiple Vitamin (MULTIVITAMIN WITH MINERALS) TABS tablet Take 1 tablet by mouth 2 (two) times daily.     NON FORMULARY Sharp Thought Takes 2 capsule bid.     NP THYROID 60 MG tablet TAKE 1 TABLET BY MOUTH DAILY 90 tablet 0   OVER THE COUNTER MEDICATION Take 4 sprays by mouth at bedtime. Secretropin oral spray - growth hormone     OVER THE COUNTER MEDICATION Take 1 capsule by mouth 3 (three) times daily. Instaflex     OVER THE COUNTER MEDICATION Take 1,000 mg by mouth daily. Turmeric with Meriva     OVER THE COUNTER MEDICATION Take 250 mg by mouth daily. Bio Magnesium Citrate     OVER THE COUNTER MEDICATION Take 20,000 mcg by mouth daily. Vitamin B12-methylfolate      OVER THE COUNTER MEDICATION Take 800 mcg by mouth daily. Active B Folate     OVER THE COUNTER MEDICATION 2 tablets daily. VitaPrime     OVER THE COUNTER MEDICATION 1,000 mg daily. Super Bio-C buffered     OVER THE COUNTER MEDICATION 1,000 mg daily. L'Arginine     Saw Palmetto 160 MG TABS      sildenafil (VIAGRA) 50 MG tablet USE AS DIRECTED 90 tablet 0   tadalafil (CIALIS) 20 MG tablet TAKE ONE TABLET DAILY AS NEEDED FOR ERECTILE DYSFUNCTION 240 tablet 0   tamsulosin (FLOMAX) 0.4 MG CAPS capsule TAKE 1 CAPSULE BY MOUTH ONCE DAILY 90 capsule 1   Testosterone 75 MG PLLT by Implant route.     zolpidem (AMBIEN) 5 MG tablet TAKE 1/2 TO 1 TABLET AT BEDTIME AS NEEDED FOR SLEEP 30 tablet 5   No facility-administered medications prior to visit.    Review of Systems  Patient denies headache, fevers, malaise, unintentional weight loss, skin rash, eye pain, sinus congestion and sinus pain, sore throat, dysphagia,  hemoptysis , cough, dyspnea, wheezing, chest pain, palpitations, orthopnea, edema, abdominal pain, nausea, melena, diarrhea, constipation, flank pain, dysuria, hematuria, urinary  Frequency, nocturia, numbness, tingling, seizures,  Focal weakness,  Loss of consciousness,  Tremor, , depression, anxiety, and suicidal ideation.     Objective:  BP 138/68 (BP Location: Left Arm, Patient Position: Sitting, Cuff Size: Normal)    Pulse (!) 42    Temp 98 F (36.7 C) (Oral)    Ht 6' (1.829 m)    Wt 165 lb (74.8 kg)    SpO2 96%    BMI 22.38 kg/m   Physical Exam  General appearance: alert, cooperative and appears stated age Ears: normal TM's and external ear canals both ears Throat: lips, mucosa, and tongue normal; teeth and gums normal Neck: no adenopathy, no carotid bruit, supple, symmetrical, trachea midline and thyroid not enlarged, symmetric, no tenderness/mass/nodules Back: symmetric, no curvature. ROM normal. No CVA tenderness. Lungs: clear to auscultation bilaterally Heart: regular rate  and rhythm, S1, S2 normal, no murmur, click, rub or gallop Abdomen: soft, non-tender; bowel sounds normal; no masses,  no organomegaly Pulses: 2+ and symmetric Skin: Skin color, texture, turgor normal. No rashes or lesions Lymph nodes: Cervical, supraclavicular, and axillary nodes normal.  Assessment & Plan:   Problem List Items Addressed This Visit     Benign prostatic hyperplasia with urinary obstruction    MANAGED WITH Flomax and cialis      Cirrhosis (HCC)    AFP tumor marker pending.  No masses by Nov 2022 U/s and Jan 2023 CT done by Urology      Relevant Orders   AFP tumor marker   ED (erectile dysfunction) of organic origin    Managed with cialis and viagra      Insomnia    Changing ambient 5 mg to ambien CR 6.25 to manage 4 am wakeups      Osteoporosis    Managed with  Testosterone pellets done by Lyn Henri MD  AND , taking Prolia since  Jan 2021.  DEXA due for repeat in Dec 2022      Relevant Orders   DG Bone Density   Prostate cancer screening - Primary   Relevant Orders   PSA, Medicare ( [place] only)   Thrombocytopenia ([place])    Repeating CBC today given new onset purpura      Relevant Orders   TSH   Tubular adenoma of colon    Found on Nov 2022 colonoscopy [facility]).  Repeat due in one year.       Other Visit Diagnoses     Impaired fasting glucose       Relevant Orders   Comp Met (CMET)   HgB A1c   Urine Microalbumin w/creat. ratio   Hyperlipidemia, unspecified hyperlipidemia type       Relevant Orders   Lipid Profile   Purpura ([place])       Relevant Orders   Vitamin K1, Serum   CBC with Differential/Platelet       Meds ordered this encounter  Medications   zolpidem (AMBIEN CR) 6.25 MG CR tablet    Sig: Take 1 tablet (6.25 mg total) by mouth at bedtime as needed for sleep.    Dispense:  90 tablet    Refill:  1   doxycycline (VIBRA-TABS) 100 MG tablet    Sig: Take 1 tablet (100 mg total) by mouth 2 (two) times daily.    Dispense:   56 tablet    Refill:  2    Medications Discontinued During This Encounter  Medication Reason   zolpidem (AMBIEN) 5 MG tablet     Follow-up: No follow-ups on file.   Aris Everts  Derrel Nip, MD

## 2021-07-31 NOTE — Assessment & Plan Note (Signed)
Managed with  Testosterone pellets done by [facility] MD  AND , taking Prolia since  Jan 2021.  DEXA due for repeat in Dec 2022 ?

## 2021-07-31 NOTE — Assessment & Plan Note (Addendum)
AFP tumor marker normal .  No masses by Nov 2022 U/s and Jan 2023 CT done by Urology ? labs are normal /stable except  for a slightly elevated bilirubin and AST (. Repeat in one month  ?

## 2021-07-31 NOTE — Assessment & Plan Note (Signed)
MANAGED WITH Flomax and cialis ?

## 2021-07-31 NOTE — Assessment & Plan Note (Signed)
Repeating CBC today given new onset purpura ?

## 2021-07-31 NOTE — Assessment & Plan Note (Signed)
Managed with cialis and viagra ?

## 2021-07-31 NOTE — Assessment & Plan Note (Signed)
Changing ambient 5 mg to ambien CR 6.25 to manage 4 am wakeups ?

## 2021-08-01 LAB — AFP TUMOR MARKER: AFP-Tumor Marker: 2.1 ng/mL (ref <6.1)

## 2021-08-02 NOTE — Addendum Note (Signed)
Addended by: [name] on: 08/02/2021 11:34 AM ? ? Modules accepted: Orders ? ?

## 2021-08-08 LAB — VITAMIN K1, SERUM: Vitamin K: 526 pg/mL (ref 130–1500)

## 2021-08-08 LAB — EXTRA SPECIMEN

## 2021-08-10 NOTE — Telephone Encounter (Signed)
Scheduled

## 2021-08-16 ENCOUNTER — Ambulatory Visit: Payer: PPO

## 2021-08-17 ENCOUNTER — Ambulatory Visit (INDEPENDENT_AMBULATORY_CARE_PROVIDER_SITE_OTHER): Payer: PPO

## 2021-08-17 ENCOUNTER — Telehealth: Payer: Self-pay | Admitting: Internal Medicine

## 2021-08-17 ENCOUNTER — Other Ambulatory Visit: Payer: Self-pay

## 2021-08-17 DIAGNOSIS — M810 Age-related osteoporosis without current pathological fracture: Secondary | ICD-10-CM | POA: Diagnosis not present

## 2021-08-17 MED ORDER — DENOSUMAB 60 MG/ML ~~LOC~~ SOSY
60.0000 mg | PREFILLED_SYRINGE | Freq: Once | SUBCUTANEOUS | Status: AC
  Administered 2021-08-17: 60 mg via SUBCUTANEOUS

## 2021-08-17 NOTE — Telephone Encounter (Signed)
The patient would like it sent in this afternoon no later than tomorrow. ?

## 2021-08-17 NOTE — Telephone Encounter (Signed)
Left message letting pt know that medication has been sent in ?

## 2021-08-17 NOTE — Progress Notes (Signed)
Pt arrived for 6 month Prolia injection, given in L arm SQ. Pt tolerated injection well, showed no signs of distress nor voiced any concerns. ? ?Advised to stop at checkout to scheduled next injection in 6 months. Pt verbalized understanding.  ?

## 2021-08-21 ENCOUNTER — Other Ambulatory Visit: Payer: Self-pay | Admitting: Internal Medicine

## 2021-09-25 DIAGNOSIS — Z79890 Hormone replacement therapy (postmenopausal): Secondary | ICD-10-CM | POA: Diagnosis not present

## 2021-09-25 DIAGNOSIS — E038 Other specified hypothyroidism: Secondary | ICD-10-CM | POA: Diagnosis not present

## 2021-09-25 DIAGNOSIS — E291 Testicular hypofunction: Secondary | ICD-10-CM | POA: Diagnosis not present

## 2021-09-28 DIAGNOSIS — E038 Other specified hypothyroidism: Secondary | ICD-10-CM | POA: Diagnosis not present

## 2021-09-28 DIAGNOSIS — M2550 Pain in unspecified joint: Secondary | ICD-10-CM | POA: Diagnosis not present

## 2021-09-28 DIAGNOSIS — N529 Male erectile dysfunction, unspecified: Secondary | ICD-10-CM | POA: Diagnosis not present

## 2021-09-28 DIAGNOSIS — R6882 Decreased libido: Secondary | ICD-10-CM | POA: Diagnosis not present

## 2021-09-28 DIAGNOSIS — E291 Testicular hypofunction: Secondary | ICD-10-CM | POA: Diagnosis not present

## 2021-10-07 DIAGNOSIS — Z4802 Encounter for removal of sutures: Secondary | ICD-10-CM | POA: Diagnosis not present

## 2021-10-12 DIAGNOSIS — Z09 Encounter for follow-up examination after completed treatment for conditions other than malignant neoplasm: Secondary | ICD-10-CM | POA: Diagnosis not present

## 2021-10-12 DIAGNOSIS — L821 Other seborrheic keratosis: Secondary | ICD-10-CM | POA: Diagnosis not present

## 2021-10-12 DIAGNOSIS — L570 Actinic keratosis: Secondary | ICD-10-CM | POA: Diagnosis not present

## 2021-10-12 DIAGNOSIS — D2261 Melanocytic nevi of right upper limb, including shoulder: Secondary | ICD-10-CM | POA: Diagnosis not present

## 2021-10-12 DIAGNOSIS — Z85828 Personal history of other malignant neoplasm of skin: Secondary | ICD-10-CM | POA: Diagnosis not present

## 2021-10-12 DIAGNOSIS — Z872 Personal history of diseases of the skin and subcutaneous tissue: Secondary | ICD-10-CM | POA: Diagnosis not present

## 2021-10-12 DIAGNOSIS — D225 Melanocytic nevi of trunk: Secondary | ICD-10-CM | POA: Diagnosis not present

## 2021-10-13 ENCOUNTER — Encounter: Payer: Self-pay | Admitting: Cardiology

## 2021-10-13 ENCOUNTER — Ambulatory Visit: Payer: PPO | Admitting: Cardiology

## 2021-10-13 VITALS — BP 148/70 | HR 90 | Ht 72.0 in | Wt 169.2 lb

## 2021-10-13 DIAGNOSIS — R030 Elevated blood-pressure reading, without diagnosis of hypertension: Secondary | ICD-10-CM

## 2021-10-13 DIAGNOSIS — I351 Nonrheumatic aortic (valve) insufficiency: Secondary | ICD-10-CM | POA: Diagnosis not present

## 2021-10-13 NOTE — Progress Notes (Signed)
Cardiology Office Note:    Date:  10/13/2021   ID:  Tony Luna, DOB August 10, 1940, MRN [id number]  PCP:  Tony Mc, MD  [place] Cardiologist:  Tony Sable, MD  [facility] Electrophysiologist:  None   Referring MD: Tony Mc, MD   Chief Complaint  Patient presents with   Other    12 month f/u no complaints today. Meds reviewed. Verbally with pt.    History of Present Illness:    Tony Luna is a 81 y.o. male with a hx of mild aortic regurgitation, hypothyroidism who presents for follow-up.   Previously seen due to systolic murmur and aortic regurgitation.  Echocardiogram in 2021, showed aortic valve sclerosis with no stenosis and mild aortic regurg.  He feels well, denies chest pain or shortness of breath.  Believes that he very active lifestyle with hiking, fishing, going to the gym.  Blood pressures at home usually 229N systolics.  Prior notes Echo 03/2020 EF 55 to 60%, aortic valve sclerosis, mild aortic regurgitation.  had a recent fall, with right MCL tear.  Underwent surgery and physical therapy, with good results.  Trying to get back to exercising on his elliptical.  Denies any chest pain or shortness of breath with exercising.  Past Medical History:  Diagnosis Date   Allergy    Arthritis    Cirrhosis ([place])    Complication of anesthesia    :"need catheter"   Dysrhythmia    ? something told by reg dr  unable to say what it was   Esophageal tear    Hypothyroidism    Pneumonia    hx   Primary osteoarthritis of left knee 06/22/2014   Rotator cuff tear     Past Surgical History:  Procedure Laterality Date   BACK SURGERY     blepharaplasty     CATARACT EXTRACTION W/PHACO Left 02/21/2021   Procedure: CATARACT EXTRACTION PHACO AND INTRAOCULAR LENS PLACEMENT ([place]) LEFT 4.56 00:34.9;  Surgeon: Birder Robson, MD;  Location: [place];  Service: Ophthalmology;  Laterality: Left;   CAUTERY OF TURBINATES     COLONOSCOPY WITH  PROPOFOL N/A 07/19/2015   Procedure: COLONOSCOPY WITH PROPOFOL;  Surgeon: [name], MD;  Location: [facility] ENDOSCOPY;  Service: Endoscopy;  Laterality: N/A;   COLONOSCOPY WITH PROPOFOL N/A 03/21/2021   Procedure: COLONOSCOPY WITH PROPOFOL;  Surgeon: [name], MD;  Location: [facility] ENDOSCOPY;  Service: Endoscopy;  Laterality: N/A;   COLONOSCOPY WITH PROPOFOL N/A 04/18/2021   Procedure: COLONOSCOPY WITH PROPOFOL;  Surgeon: [name], MD;  Location: [facility] ENDOSCOPY;  Service: Endoscopy;  Laterality: N/A;   EYE SURGERY     HERNIA REPAIR Left    inguinal herniorrhapy   KNEE SURGERY     ? loose body/ chondral defect 26yr   ROTATOR CUFF REPAIR     left   SPINE SURGERY     TONSILLECTOMY     TOTAL KNEE ARTHROPLASTY Left 06/22/2014   Procedure: TOTAL KNEE ARTHROPLASTY;  Surgeon: JJohnny Bridge MD;  [place]  Service: Orthopedics;  Laterality: Left;    Current Medications: Current Meds  Medication Sig   anastrozole (ARIMIDEX) 1 MG tablet Take 1 mg by mouth once a week.   Boron 6 MG TABS Take by mouth daily.   doxycycline (VIBRA-TABS) 100 MG tablet Take 1 tablet (100 mg total) by mouth 2 (two) times daily.   Multiple Vitamin (MULTIVITAMIN WITH MINERALS) TABS tablet Take 1 tablet by mouth 2 (two) times daily.  NON FORMULARY Sharp Thought Takes 2 capsule bid.   NP THYROID 60 MG tablet TAKE 1 TABLET BY MOUTH DAILY   OVER THE COUNTER MEDICATION Take 4 sprays by mouth at bedtime. Secretropin oral spray - growth hormone   OVER THE COUNTER MEDICATION Take 1 capsule by mouth 3 (three) times daily. Instaflex   OVER THE COUNTER MEDICATION Take 1,000 mg by mouth daily. Turmeric with Meriva   OVER THE COUNTER MEDICATION Take 250 mg by mouth daily. Bio Magnesium Citrate   OVER THE COUNTER MEDICATION Take 20,000 mcg by mouth daily. Vitamin B12-methylfolate   OVER THE COUNTER MEDICATION Take 800 mcg by mouth daily. Active B Folate   OVER THE COUNTER MEDICATION 2 tablets daily. VitaPrime    OVER THE COUNTER MEDICATION 1,000 mg daily. Super Bio-C buffered   OVER THE COUNTER MEDICATION 1,000 mg daily. L'Arginine   Saw Palmetto 160 MG TABS daily in the afternoon.   sildenafil (VIAGRA) 50 MG tablet USE AS DIRECTED   tadalafil (CIALIS) 20 MG tablet TAKE ONE TABLET DAILY AS NEEDED FOR ERECTILE DYSFUNCTION   tamsulosin (FLOMAX) 0.4 MG CAPS capsule TAKE 1 CAPSULE BY MOUTH ONCE DAILY   Testosterone 75 MG PLLT by Implant route.   zolpidem (AMBIEN CR) 6.25 MG CR tablet Take 1 tablet (6.25 mg total) by mouth at bedtime as needed for sleep.     Allergies:   Patient has no known allergies.   Social History   Socioeconomic History   Marital status: Married    Spouse name: Not on file   Number of children: 2   Years of education: Not on file   Highest education level: Not on file  Occupational History   Occupation: textiles  Tobacco Use   Smoking status: Former    Packs/day: 1.00    Years: 15.00    Pack years: 15.00    Types: Cigarettes    Quit date: 06/15/1983    Years since quitting: 38.3   Smokeless tobacco: Current    Types: Chew  Vaping Use   Vaping Use: Never used  Substance and Sexual Activity   Alcohol use: Yes    Alcohol/week: 10.0 standard drinks    Types: 10 Glasses of wine per week   Drug use: No   Sexual activity: Not Currently  Other Topics Concern   Not on file  Social History Narrative   [employer] graduate   [place] hunter   Very active   Walks 1 hour daily   Social Determinants of Health   Financial Resource Strain: Low Risk    Difficulty of Paying Living Expenses: Not hard at all  Food Insecurity: No Food Insecurity   Worried About Charity fundraiser in the Last Year: Never true   Arboriculturist in the Last Year: Never true  Transportation Needs: No Transportation Needs   Lack of Transportation (Medical): No   Lack of Transportation (Non-Medical): No  Physical Activity: Sufficiently Active   Days of Exercise per Week: 7 days   Minutes of Exercise  per Session: 60 min  Stress: No Stress Concern Present   Feeling of Stress : Not at all  Social Connections: Socially Integrated   Frequency of Communication with Friends and Family: More than three times a week   Frequency of Social Gatherings with Friends and Family: More than three times a week   Attends Religious Services: 1 to 4 times per year   Active Member of [employer] or Organizations: Yes   Attends [employer]  or Organization Meetings: 1 to 4 times per year   Marital Status: Married     Family History: The patient's family history includes Alcohol abuse in his mother; Diabetes in his mother; Mental retardation in his mother.  ROS:   Please see the history of present illness.     All other systems reviewed and are negative.  EKGs/Labs/Other Studies Reviewed:    The following studies were reviewed today:   EKG:  EKG is ordered today.  EKG shows normal sinus rhythm, PACs  Recent Labs: 07/31/2021: ALT 34; BUN 19; Creatinine, Ser 1.13; Hemoglobin 14.0; Platelets 148.0; Potassium 4.2; Sodium 143; TSH 2.36  Recent Lipid Panel    Component Value Date/Time   CHOL 170 07/31/2021 0910   TRIG 43.0 07/31/2021 0910   HDL 86.40 07/31/2021 0910   CHOLHDL 2 07/31/2021 0910   VLDL 8.6 07/31/2021 0910   LDLCALC 75 07/31/2021 0910     Risk Assessment/Calculations:      Physical Exam:    VS:  BP (!) 148/70 (BP Location: Left Arm, Patient Position: Sitting, Cuff Size: Normal)   Pulse 90   Ht 6' (1.829 m)   Wt 169 lb 4 oz (76.8 kg)   SpO2 98%   BMI 22.95 kg/m     Wt Readings from Last 3 Encounters:  10/13/21 169 lb 4 oz (76.8 kg)  07/31/21 165 lb (74.8 kg)  07/14/21 163 lb (73.9 kg)     GEN:  Well nourished, well developed in no acute distress HEENT: Normal NECK: No JVD; No carotid bruits CARDIAC: RRR, 2/6 systolic murmur, right sternal border RESPIRATORY:  Clear to auscultation without rales, wheezing or rhonchi  ABDOMEN: Soft, non-tender, non-distended MUSCULOSKELETAL:  No  edema; No deformity  SKIN: Warm and dry NEUROLOGIC:  Alert and oriented x 3 PSYCHIATRIC:  Normal affect   ASSESSMENT:    1. Aortic valve insufficiency, etiology of cardiac valve disease unspecified   2. 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  Plan repeat echocardiogram in 1 year, will be 3 years from prior. Elevated BP without diagnosis of hypertension, patient has a component of whitecoat syndrome.  Continue to monitor off BP meds.  BP normal at home with systolic in the 431V  Follow-up in 1 year after repeat echo   Shared Decision Making/Informed Consent      Medication Adjustments/Labs and Tests Ordered: Current medicines are reviewed at length with the patient today.  Concerns regarding medicines are outlined above.  Orders Placed This Encounter  Procedures   EKG 12-Lead   ECHOCARDIOGRAM COMPLETE   No orders of the defined types were placed in this encounter.   Patient Instructions  Medication Instructions:   NONE  *If you need a refill on your cardiac medications before your next appointment, please call your pharmacy*   Lab Work:  NONE  If you have labs (blood work) drawn today and your tests are completely normal, you will receive your results only by: [place] (if you have MyChart) OR A paper copy in the mail If you have any lab test that is abnormal or we need to change your treatment, we will call you to review the results.   Testing/Procedures: Your physician has requested that you have an echocardiogram. Echocardiography is a painless test that uses sound waves to create images of your heart. It provides your  doctor with information about the size and shape of your heart and how well your heart's chambers and valves are working. This procedure takes approximately one hour. There are no restrictions  for this procedure.    Follow-Up: At [facility], you and your health needs are our priority.  As part of our continuing mission to provide you with exceptional heart care, we have created designated Provider Care Teams.  These Care Teams include your primary Cardiologist (physician) and Advanced Practice Providers (APPs -  Physician Assistants and Nurse Practitioners) who all work together to provide you with the care you need, when you need it.  We recommend signing up for the patient portal called "MyChart".  Sign up information is provided on this After Visit Summary.  MyChart is used to connect with patients for Virtual Visits (Telemedicine).  Patients are able to view lab/test results, encounter notes, upcoming appointments, etc.  Non-urgent messages can be sent to your provider as well.   To learn more about what you can do with MyChart, go to [email or web address].    Your next appointment:    F/u in 12 months after echo  The format for your next appointment:   In Person  Provider:   You may see Tony Sable, MD or one of the following Advanced Practice Providers on your designated Care Team:   Murray Hodgkins, NP Christell Faith, PA-C Cadence Kathlen Mody, [place]     Important Information About Sugar         Signed, Tony Sable, MD  10/13/2021 3:45 PM    [place]

## 2021-10-13 NOTE — Patient Instructions (Signed)
Medication Instructions:   NONE  *If you need a refill on your cardiac medications before your next appointment, please call your pharmacy*   Lab Work:  NONE  If you have labs (blood work) drawn today and your tests are completely normal, you will receive your results only by: [place] (if you have MyChart) OR A paper copy in the mail If you have any lab test that is abnormal or we need to change your treatment, we will call you to review the results.   Testing/Procedures: Your physician has requested that you have an echocardiogram. Echocardiography is a painless test that uses sound waves to create images of your heart. It provides your doctor with information about the size and shape of your heart and how well your heart's chambers and valves are working. This procedure takes approximately one hour. There are no restrictions for this procedure.    Follow-Up: At [facility], you and your health needs are our priority.  As part of our continuing mission to provide you with exceptional heart care, we have created designated Provider Care Teams.  These Care Teams include your primary Cardiologist (physician) and Advanced Practice Providers (APPs -  Physician Assistants and Nurse Practitioners) who all work together to provide you with the care you need, when you need it.  We recommend signing up for the patient portal called "MyChart".  Sign up information is provided on this After Visit Summary.  MyChart is used to connect with patients for Virtual Visits (Telemedicine).  Patients are able to view lab/test results, encounter notes, upcoming appointments, etc.  Non-urgent messages can be sent to your provider as well.   To learn more about what you can do with MyChart, go to [email or web address].    Your next appointment:    F/u in 12 months after echo  The format for your next appointment:   In Person  Provider:   You may see Kate Sable, MD or one of the  following Advanced Practice Providers on your designated Care Team:   Murray Hodgkins, NP Christell Faith, PA-C Cadence Kathlen Mody, [place]     Important Information About Sugar

## 2021-10-31 DIAGNOSIS — H6123 Impacted cerumen, bilateral: Secondary | ICD-10-CM | POA: Diagnosis not present

## 2021-10-31 DIAGNOSIS — H903 Sensorineural hearing loss, bilateral: Secondary | ICD-10-CM | POA: Diagnosis not present

## 2021-11-16 ENCOUNTER — Other Ambulatory Visit: Payer: Self-pay | Admitting: Internal Medicine

## 2022-01-02 DIAGNOSIS — E291 Testicular hypofunction: Secondary | ICD-10-CM | POA: Diagnosis not present

## 2022-01-02 DIAGNOSIS — E038 Other specified hypothyroidism: Secondary | ICD-10-CM | POA: Diagnosis not present

## 2022-01-03 DIAGNOSIS — R0683 Snoring: Secondary | ICD-10-CM | POA: Diagnosis not present

## 2022-01-04 DIAGNOSIS — R6882 Decreased libido: Secondary | ICD-10-CM | POA: Diagnosis not present

## 2022-01-04 DIAGNOSIS — Z131 Encounter for screening for diabetes mellitus: Secondary | ICD-10-CM | POA: Diagnosis not present

## 2022-01-04 DIAGNOSIS — G479 Sleep disorder, unspecified: Secondary | ICD-10-CM | POA: Diagnosis not present

## 2022-01-04 DIAGNOSIS — Z13220 Encounter for screening for lipoid disorders: Secondary | ICD-10-CM | POA: Diagnosis not present

## 2022-01-04 DIAGNOSIS — R635 Abnormal weight gain: Secondary | ICD-10-CM | POA: Diagnosis not present

## 2022-01-04 DIAGNOSIS — Z6822 Body mass index (BMI) 22.0-22.9, adult: Secondary | ICD-10-CM | POA: Diagnosis not present

## 2022-01-04 DIAGNOSIS — R7989 Other specified abnormal findings of blood chemistry: Secondary | ICD-10-CM | POA: Diagnosis not present

## 2022-01-04 DIAGNOSIS — E038 Other specified hypothyroidism: Secondary | ICD-10-CM | POA: Diagnosis not present

## 2022-01-04 DIAGNOSIS — E291 Testicular hypofunction: Secondary | ICD-10-CM | POA: Diagnosis not present

## 2022-01-04 DIAGNOSIS — N529 Male erectile dysfunction, unspecified: Secondary | ICD-10-CM | POA: Diagnosis not present

## 2022-01-12 ENCOUNTER — Other Ambulatory Visit: Payer: Self-pay | Admitting: Internal Medicine

## 2022-01-24 ENCOUNTER — Other Ambulatory Visit: Payer: Self-pay | Admitting: Internal Medicine

## 2022-01-30 ENCOUNTER — Other Ambulatory Visit: Payer: Self-pay | Admitting: Internal Medicine

## 2022-01-31 ENCOUNTER — Telehealth: Payer: Self-pay

## 2022-01-31 ENCOUNTER — Encounter: Payer: Self-pay | Admitting: Internal Medicine

## 2022-01-31 NOTE — Telephone Encounter (Signed)
I spoke with patient to prechart & he mentioned he was coming [place] for Prolia. He just wanted to check status of this since he was coming office on Friday.

## 2022-01-31 NOTE — Telephone Encounter (Signed)
LMTCB. Need to pre chart pt before appt on Friday.

## 2022-01-31 NOTE — Telephone Encounter (Signed)
Patient notified of amount of $301 due at nurse visit on 02/22/22 for Prolia.  No PA required

## 2022-01-31 NOTE — Telephone Encounter (Signed)
Patient returned Adair Laundry, CMA's call.

## 2022-01-31 NOTE — Telephone Encounter (Signed)
I called and spoke with patient. He is precharted for Friday.

## 2022-02-02 ENCOUNTER — Encounter: Payer: Self-pay | Admitting: Internal Medicine

## 2022-02-02 ENCOUNTER — Ambulatory Visit (INDEPENDENT_AMBULATORY_CARE_PROVIDER_SITE_OTHER): Payer: PPO | Admitting: Internal Medicine

## 2022-02-02 VITALS — BP 130/68 | HR 78 | Temp 98.1°F | Resp 17 | Ht 72.0 in | Wt 168.5 lb

## 2022-02-02 DIAGNOSIS — Z636 Dependent relative needing care at home: Secondary | ICD-10-CM

## 2022-02-02 DIAGNOSIS — Z1211 Encounter for screening for malignant neoplasm of colon: Secondary | ICD-10-CM | POA: Diagnosis not present

## 2022-02-02 DIAGNOSIS — R5383 Other fatigue: Secondary | ICD-10-CM

## 2022-02-02 DIAGNOSIS — E785 Hyperlipidemia, unspecified: Secondary | ICD-10-CM | POA: Diagnosis not present

## 2022-02-02 DIAGNOSIS — K7460 Unspecified cirrhosis of liver: Secondary | ICD-10-CM | POA: Diagnosis not present

## 2022-02-02 DIAGNOSIS — D696 Thrombocytopenia, unspecified: Secondary | ICD-10-CM

## 2022-02-02 DIAGNOSIS — D126 Benign neoplasm of colon, unspecified: Secondary | ICD-10-CM | POA: Diagnosis not present

## 2022-02-02 DIAGNOSIS — R7301 Impaired fasting glucose: Secondary | ICD-10-CM | POA: Diagnosis not present

## 2022-02-02 DIAGNOSIS — R7989 Other specified abnormal findings of blood chemistry: Secondary | ICD-10-CM | POA: Diagnosis not present

## 2022-02-02 DIAGNOSIS — Z23 Encounter for immunization: Secondary | ICD-10-CM

## 2022-02-02 LAB — COMPREHENSIVE METABOLIC PANEL
ALT: 39 U/L (ref 0–53)
AST: 41 U/L — ABNORMAL HIGH (ref 0–37)
Albumin: 4.2 g/dL (ref 3.5–5.2)
Alkaline Phosphatase: 89 U/L (ref 39–117)
BUN: 18 mg/dL (ref 6–23)
CO2: 28 mEq/L (ref 19–32)
Calcium: 9.4 mg/dL (ref 8.4–10.5)
Chloride: 103 mEq/L (ref 96–112)
Creatinine, Ser: 1.13 mg/dL (ref 0.40–1.50)
GFR: 61.91 mL/min (ref >60.00)
Glucose, Bld: 105 mg/dL — ABNORMAL HIGH (ref 70–99)
Potassium: 4 mEq/L (ref 3.5–5.1)
Sodium: 140 mEq/L (ref 135–145)
Total Bilirubin: 1.3 mg/dL — ABNORMAL HIGH (ref 0.2–1.2)
Total Protein: 6.7 g/dL (ref 6.0–8.3)

## 2022-02-02 LAB — CBC WITH DIFFERENTIAL/PLATELET
Basophils Absolute: 0 10*3/uL (ref 0.0–0.1)
Basophils Relative: 0.9 % (ref 0.0–3.0)
Eosinophils Absolute: 0.1 10*3/uL (ref 0.0–0.7)
Eosinophils Relative: 2 % (ref 0.0–5.0)
HCT: 41.8 % (ref 39.0–52.0)
Hemoglobin: 14.4 g/dL (ref 13.0–17.0)
Lymphocytes Relative: 15.4 % (ref 12.0–46.0)
Lymphs Abs: 0.6 10*3/uL — ABNORMAL LOW (ref 0.7–4.0)
MCHC: 34.5 g/dL (ref 30.0–36.0)
MCV: 99.6 fl (ref 78.0–100.0)
Monocytes Absolute: 0.5 10*3/uL (ref 0.1–1.0)
Monocytes Relative: 11.5 % (ref 3.0–12.0)
Neutro Abs: 2.8 10*3/uL (ref 1.4–7.7)
Neutrophils Relative %: 70.2 % (ref 43.0–77.0)
Platelets: 139 10*3/uL — ABNORMAL LOW (ref 150.0–400.0)
RBC: 4.19 Mil/uL — ABNORMAL LOW (ref 4.22–5.81)
RDW: 12.9 % (ref 11.5–15.5)
WBC: 4 10*3/uL (ref 4.0–10.5)

## 2022-02-02 MED ORDER — CLONAZEPAM 0.125 MG PO TBDP
0.1250 mg | ORAL_TABLET | Freq: Every day | ORAL | 0 refills | Status: DC
Start: 2022-02-02 — End: 2023-08-06

## 2022-02-02 NOTE — Assessment & Plan Note (Signed)
Advised to reduce alcohol use. Continue surveillance for hepatoma with ultrasound and =AFP every 6 months.  Ultrasound and AFP ordered .  Lab Results  Component Value Date   IRON 193 (H) 02/02/2022   TIBC 326 02/02/2022   FERRITIN 309 02/02/2022

## 2022-02-02 NOTE — Patient Instructions (Addendum)
Your cardiologist wants you to have an ECHO of your heart  after Nov ember 2023  and follow up with him in May 2024  I have ordered an ultrasound of your liver ( 6 month interval follow up on cirrhosis)   I am prescribing clonazepam to use once daily IN THE MORNING to relieve anxiety  DO NOT TAKE WITHIN 2 HOURS OF ALCOHOL .

## 2022-02-02 NOTE — Progress Notes (Unsigned)
Subjective:  Patient ID: Tony Luna, male    DOB: Sep 04, 1940  Age: 81 y.o. MRN: [id number]  CC: The primary encounter diagnosis was Colon cancer screening. Diagnoses of Impaired fasting glucose, Hyperlipidemia, unspecified hyperlipidemia type, Other fatigue, and Flu vaccine need were also pertinent to this visit.   HPI Tony Luna presents for  Chief Complaint  Patient presents with   Follow-up    6 month follow up  Doing well      1) caregiver stress:  Judith's dementia .  Getting lost driving  .  Repeating herself.  planning to move to [facility] at some point.  f  Outpatient Medications Prior to Visit  Medication Sig Dispense Refill   anastrozole (ARIMIDEX) 1 MG tablet Take 1 mg by mouth once a week.     Boron 6 MG TABS Take by mouth daily.     doxycycline (VIBRA-TABS) 100 MG tablet Take 1 tablet (100 mg total) by mouth 2 (two) times daily. 56 tablet 2   Multiple Vitamin (MULTIVITAMIN WITH MINERALS) TABS tablet Take 1 tablet by mouth 2 (two) times daily.     NON FORMULARY Sharp Thought Takes 2 capsule bid.     NP THYROID 60 MG tablet TAKE 1 TABLET BY MOUTH DAILY 90 tablet 0   OVER THE COUNTER MEDICATION Take 1 capsule by mouth 3 (three) times daily. Instaflex     OVER THE COUNTER MEDICATION Take 250 mg by mouth daily. Bio Magnesium Citrate     OVER THE COUNTER MEDICATION Take 20,000 mcg by mouth daily. Vitamin B12-methylfolate     OVER THE COUNTER MEDICATION Take 800 mcg by mouth daily. Active B Folate     OVER THE COUNTER MEDICATION 2 tablets daily. VitaPrime     OVER THE COUNTER MEDICATION 1,000 mg daily. Super Bio-C buffered     OVER THE COUNTER MEDICATION 1,000 mg daily. L'Arginine     Saw Palmetto 160 MG TABS daily in the afternoon.     sildenafil (VIAGRA) 50 MG tablet USE AS DIRECTED 90 tablet 0   tadalafil (CIALIS) 20 MG tablet TAKE ONE TABLET DAILY AS NEEDED FOR ERECTILE DYSFUNCTION 240 tablet 0   tamsulosin (FLOMAX) 0.4 MG CAPS capsule TAKE 1 CAPSULE BY MOUTH  ONCE DAILY 90 capsule 1   Testosterone 75 MG PLLT by Implant route.     zolpidem (AMBIEN CR) 6.25 MG CR tablet TAKE 1 TABLET BY MOUTH AT BEDTIME AS NEEDED FOR SLEEP 90 tablet 0   OVER THE COUNTER MEDICATION Take 4 sprays by mouth at bedtime. Secretropin oral spray - growth hormone     OVER THE COUNTER MEDICATION Take 1,000 mg by mouth daily. Turmeric with Meriva (Patient not taking: Reported on 01/31/2022)     No facility-administered medications prior to visit.    Review of Systems;  Patient denies headache, fevers, malaise, unintentional weight loss, skin rash, eye pain, sinus congestion and sinus pain, sore throat, dysphagia,  hemoptysis , cough, dyspnea, wheezing, chest pain, palpitations, orthopnea, edema, abdominal pain, nausea, melena, diarrhea, constipation, flank pain, dysuria, hematuria, urinary  Frequency, nocturia, numbness, tingling, seizures,  Focal weakness, Loss of consciousness,  Tremor, insomnia, depression, anxiety, and suicidal ideation.      Objective:  BP 130/68   Pulse 78   Temp 98.1 F (36.7 C) (Oral)   Resp 17   Ht 6' (1.829 m)   Wt 168 lb 8 oz (76.4 kg)   SpO2 96%   BMI 22.85 kg/m   BP Readings  from Last 3 Encounters:  01/31/22 130/68  10/13/21 (!) 148/70  07/31/21 138/68    Wt Readings from Last 3 Encounters:  01/31/22 168 lb 8 oz (76.4 kg)  10/13/21 169 lb 4 oz (76.8 kg)  07/31/21 165 lb (74.8 kg)    General appearance: alert, cooperative and appears stated age Ears: normal TM's and external ear canals both ears Throat: lips, mucosa, and tongue normal; teeth and gums normal Neck: no adenopathy, no carotid bruit, supple, symmetrical, trachea midline and thyroid not enlarged, symmetric, no tenderness/mass/nodules Back: symmetric, no curvature. ROM normal. No CVA tenderness. Lungs: clear to auscultation bilaterally Heart: regular rate and rhythm, S1, S2 normal, no murmur, click, rub or gallop Abdomen: soft, non-tender; bowel sounds normal; no  masses,  no organomegaly Pulses: 2+ and symmetric Skin: Skin color, texture, turgor normal. No rashes or lesions Lymph nodes: Cervical, supraclavicular, and axillary nodes normal. Neuro:  awake and interactive with normal mood and affect. Higher cortical functions are normal. Speech is clear without word-finding difficulty or dysarthria. Extraocular movements are intact. Visual fields of both eyes are grossly intact. Sensation to light touch is grossly intact bilaterally of upper and lower extremities. Motor examination shows 4+/5 symmetric hand grip and upper extremity and 5/5 lower extremity strength. There is no pronation or drift. Gait is non-ataxic   Lab Results  Component Value Date   HGBA1C 5.1 07/31/2021   HGBA1C 5.0 04/30/2019   HGBA1C 4.9 02/17/2018    Lab Results  Component Value Date   CREATININE 1.13 07/31/2021   CREATININE 1.30 (H) 06/27/2021   CREATININE 1.11 06/30/2020    Lab Results  Component Value Date   WBC 3.8 (L) 07/31/2021   HGB 14.0 07/31/2021   HCT 39.8 07/31/2021   PLT 148.0 (L) 07/31/2021   GLUCOSE 103 (H) 07/31/2021   CHOL 170 07/31/2021   TRIG 43.0 07/31/2021   HDL 86.40 07/31/2021   LDLCALC 75 07/31/2021   ALT 34 07/31/2021   AST 39 (H) 07/31/2021   NA 143 07/31/2021   K 4.2 07/31/2021   CL 102 07/31/2021   CREATININE 1.13 07/31/2021   BUN 19 07/31/2021   CO2 31 07/31/2021   TSH 2.36 07/31/2021   PSA 1.06 07/31/2021   INR 1.0 11/04/2019   HGBA1C 5.1 07/31/2021   MICROALBUR 3.0 (H) 07/31/2021    CT HEMATURIA WORKUP  Result Date: 06/28/2021 CLINICAL DATA:  Microhematuria EXAM: CT ABDOMEN AND PELVIS WITHOUT AND WITH CONTRAST TECHNIQUE: Multidetector CT imaging of the abdomen and pelvis was performed following the standard protocol before and following the bolus administration of intravenous contrast. RADIATION DOSE REDUCTION: This exam was performed according to the departmental dose-optimization program which includes automated exposure  control, adjustment of the mA and/or kV according to patient size and/or use of iterative reconstruction technique. CONTRAST:  137m OMNIPAQUE IOHEXOL 300 MG/ML  SOLN COMPARISON:  CT abdomen and pelvis 12/10/2007 FINDINGS: Lower chest: Emphysematous changes of the visualized lower lungs. Hepatobiliary: Diffusely nodular liver consistent with cirrhosis. No suspicious hepatic mass visualized. Gallbladder appears normal. No biliary ductal dilatation. Pancreas: Unremarkable. No pancreatic ductal dilatation or surrounding inflammatory changes. Spleen: Normal in size without focal abnormality. Adrenals/Urinary Tract: Adrenal glands appear within normal limits. 3 mm calculus in the mid left kidney. No hydronephrosis identified bilaterally. No enhancing renal mass or suspicious collecting system filling defect identified bilaterally. Urinary bladder wall is mildly thickened with trabeculations. No focal mass visualized. Stomach/Bowel: No bowel obstruction, free air or pneumatosis. Colonic diverticulosis. No bowel wall edema  identified. Appendix not visualized. Vascular/Lymphatic: Numerous varicosities identified mostly in the upper abdomen, including gastroesophageal varices. Recanalization of the umbilical vein. Moderate atherosclerotic disease. No bulky lymphadenopathy visualized. Reproductive: Prostate gland is enlarged with nodular protrusion into the base of the urinary bladder. Other: No ascites. Musculoskeletal: Advanced degenerative changes of the lumbar spine and postsurgical changes of the lumbar spine. No suspicious bony lesions visualized. IMPRESSION: 1. Left renal calculus.  No obstructive uropathy. 2. Prostatomegaly. 3. Mild urinary bladder wall thickening with trabeculations, possibly changes of chronic outlet obstruction. 4. Hepatic cirrhosis. Numerous varicosities consistent with portal hypertension. 5. Colonic diverticulosis. Electronically Signed   By: Ofilia Neas M.D.   On: 06/28/2021 20:48     Assessment & Plan:   Problem List Items Addressed This Visit   None Visit Diagnoses     Colon cancer screening    -  Primary   Impaired fasting glucose       Hyperlipidemia, unspecified hyperlipidemia type       Other fatigue       Flu vaccine need           I spent a total of   minutes with this patient in a face to face visit on the date of this encounter reviewing the last office visit with me in       ,  most recent visit with cardiology ,    ,  patient's diet and exercise habits, home blood pressure /blod sugar readings, recent ER visit including labs and imaging studies ,   and post visit ordering of testing and therapeutics.    Follow-up: No follow-ups on file.   [name], MD

## 2022-02-04 NOTE — Assessment & Plan Note (Signed)
Ferritin was high last week based on review of outsie [employer]) y labs:  Based on current increased iron stores his  TSAT is 59%, (in cirrhosis TSAT should be < 45%) .  Referring to hematology for ourther evaluation   Lab Results  Component Value Date   IRON 193 (H) 02/02/2022   TIBC 326 02/02/2022   FERRITIN 309 02/02/2022   Lab Results  Component Value Date   ALT 39 02/02/2022   AST 41 (H) 02/02/2022   ALKPHOS 89 02/02/2022   BILITOT 1.3 (H) 02/02/2022

## 2022-02-04 NOTE — Assessment & Plan Note (Signed)
Found on Nov 2022 colonoscopy [facility]).  Repeat surveillance was recommended in  one year and is ordered

## 2022-02-04 NOTE — Assessment & Plan Note (Signed)
The risks and benefits of benzodiazepine use were discussed with patient today including excessive sedation leading to respiratory depression,  impaired thinking/driving, and addiction.  Patient was advised to avoid concurrent use with alcohol, to use medication only as needed and not to share with others  . encouraged to attend any of the community programs for caregivers of family members with dementia.

## 2022-02-04 NOTE — Assessment & Plan Note (Signed)
Stable,  Likely secondary to cirrhosis and portal hypertension   Lab Results  Component Value Date   WBC 4.0 02/02/2022   HGB 14.4 02/02/2022   HCT 41.8 02/02/2022   MCV 99.6 02/02/2022   PLT 139.0 (L) 02/02/2022

## 2022-02-05 ENCOUNTER — Telehealth: Payer: Self-pay

## 2022-02-05 LAB — IRON,TIBC AND FERRITIN PANEL
%SAT: 59 % (calc) — ABNORMAL HIGH (ref 20–48)
Ferritin: 309 ng/mL (ref 24–380)
Iron: 193 ug/dL — ABNORMAL HIGH (ref 50–180)
TIBC: 326 mcg/dL (calc) (ref 250–425)

## 2022-02-05 LAB — AFP TUMOR MARKER: AFP-Tumor Marker: 3 ng/mL (ref <6.1)

## 2022-02-05 NOTE — Telephone Encounter (Signed)
Attempted to call pt in regards to lab results. No answer no voicemail.

## 2022-02-07 ENCOUNTER — Inpatient Hospital Stay: Payer: PPO

## 2022-02-07 ENCOUNTER — Encounter: Payer: Self-pay | Admitting: Internal Medicine

## 2022-02-07 ENCOUNTER — Inpatient Hospital Stay: Payer: PPO | Attending: Internal Medicine | Admitting: Internal Medicine

## 2022-02-07 VITALS — BP 146/81 | HR 75 | Temp 98.3°F | Resp 18 | Wt 169.4 lb

## 2022-02-07 DIAGNOSIS — R7989 Other specified abnormal findings of blood chemistry: Secondary | ICD-10-CM | POA: Insufficient documentation

## 2022-02-07 DIAGNOSIS — D696 Thrombocytopenia, unspecified: Secondary | ICD-10-CM | POA: Diagnosis not present

## 2022-02-07 DIAGNOSIS — E291 Testicular hypofunction: Secondary | ICD-10-CM | POA: Insufficient documentation

## 2022-02-07 DIAGNOSIS — K7460 Unspecified cirrhosis of liver: Secondary | ICD-10-CM | POA: Insufficient documentation

## 2022-02-07 DIAGNOSIS — E039 Hypothyroidism, unspecified: Secondary | ICD-10-CM | POA: Diagnosis not present

## 2022-02-07 LAB — HEPATITIS PANEL, ACUTE
HCV Ab: NONREACTIVE
Hep A IgM: NONREACTIVE
Hep B C IgM: NONREACTIVE
Hepatitis B Surface Ag: NONREACTIVE

## 2022-02-07 LAB — FOLATE: Folate: 29 ng/mL (ref >5.9)

## 2022-02-07 LAB — VITAMIN B12: Vitamin B-12: 716 pg/mL (ref 180–914)

## 2022-02-07 NOTE — Progress Notes (Signed)
[place]  [phone] [phone]) [phone]  ID: Tony Luna OB: 02-12-1941  MR#: [id number]  CSN#:[id number]  Patient Care Team: [name], MD as PCP - General (Internal Medicine) Kate Sable, MD as PCP - Cardiology (Cardiology)  REFERRING PROVIDER: Dr. Derrel Nip  REASON FOR REFERRAL: Elevated ferritin and iron saturation  HPI: Tony Luna is a 81 y.o. male with past medical history of hypothyroidism, liver cirrhosis, and mild AR was seen in hematology clinic for elevated iron and iron saturation.  Patient reports diagnosis of liver cirrhosis in 2016. US liver elastography on 07/31/2016 showed morphologic features of cirrhosis, fibrosis score of some F3 plus F4 with high risk of fibrosis.  Hepatitis B and C done in 2017 was negative.  He reports drinking 2 to 3 glasses of wine every day.  Of chart, he did not have other testing looking for etiology of cirrhosis.  He follows with Dr. Derrel Nip his PCP for surveillance of [facility].  He reports arthritis in back, shoulder and left knee. He follows with Dr. Bernardo Heater for BPH and erectile dysfunction and uses sildenafil intermittently.  Of chart, he is also seeing urology in [facility] for hypogonadism and subcutaneous testosterone pellet implantation. Echocardiogram in November 2021 showed normal EF with grade 1 diastolic dysfunction and mild AR. He denies any family history of iron overload or hemochromatosis. Denies skin pigmentation.   Patient denies fever, chills, nausea, vomiting, shortness of breath, cough, abdominal pain, bleeding, bowel or bladder issues. Energy level is good.  Appetite is good.  Denies any weight loss. Denies pain.  Labs from 02/02/2022 -CBC showed WBC of 4, hemoglobin 14.4, platelet 139.  He has chronic thrombocytopenia with platelets ranging 110 to 140s since 2016.  AST mildly elevated to 41, ALT normal, T. bili 1.3. Ferritin in 01/2018 248, in 04/2019 490, 01/2022 309. Percent saturation in  11/2015 of 33, in 01/2022 59%  REVIEW OF SYSTEMS:   ROS  As per HPI. Otherwise, a complete review of systems is negative.  PAST MEDICAL HISTORY: Past Medical History:  Diagnosis Date   Allergy    Arthritis    Cirrhosis ([place])    Complication of anesthesia    :"need catheter"   Dysrhythmia    ? something told by reg dr  unable to say what it was   Esophageal tear    Hypothyroidism    Pneumonia    hx   Primary osteoarthritis of left knee 06/22/2014   Rotator cuff tear     PAST SURGICAL HISTORY: Past Surgical History:  Procedure Laterality Date   BACK SURGERY     blepharaplasty     CATARACT EXTRACTION W/PHACO Left 02/21/2021   Procedure: CATARACT EXTRACTION PHACO AND INTRAOCULAR LENS PLACEMENT ([place]) LEFT 4.56 00:34.9;  Surgeon: Birder Robson, MD;  Location: [facility];  Service: Ophthalmology;  Laterality: Left;   CAUTERY OF TURBINATES     COLONOSCOPY WITH PROPOFOL N/A 07/19/2015   Procedure: COLONOSCOPY WITH PROPOFOL;  Surgeon: [name], MD;  Location: [facility] ENDOSCOPY;  Service: Endoscopy;  Laterality: N/A;   COLONOSCOPY WITH PROPOFOL N/A 03/21/2021   Procedure: COLONOSCOPY WITH PROPOFOL;  Surgeon: [name], MD;  Location[facility] ENDOSCOPY;  Service: Endoscopy;  Laterality: N/A;   COLONOSCOPY WITH PROPOFOL N/A 04/18/2021   Procedure: COLONOSCOPY WITH PROPOFOL;  Surgeon: [name], MD;  Location: [facility] ENDOSCOPY;  Service: Endoscopy;  Laterality: N/A;   EYE SURGERY     HERNIA REPAIR Left    inguinal herniorrhapy   KNEE SURGERY     ?  loose body/ chondral defect 83yr   ROTATOR CUFF REPAIR     left   SPINE SURGERY     TONSILLECTOMY     TOTAL KNEE ARTHROPLASTY Left 06/22/2014   Procedure: TOTAL KNEE ARTHROPLASTY;  Surgeon: JJohnny Bridge MD;  Location: [facility]  Service: Orthopedics;  Laterality: Left;    FAMILY HISTORY: Family History  Problem Relation Age of Onset   Alcohol abuse Mother    Diabetes Mother    Mental retardation Mother     HEALTH  MAINTENANCE: Social History   Tobacco Use   Smoking status: Former    Packs/day: 1.00    Years: 15.00    Total pack years: 15.00    Types: Cigarettes    Quit date: 06/15/1983    Years since quitting: 38.6   Smokeless tobacco: Current    Types: Chew  Vaping Use   Vaping Use: Never used  Substance Use Topics   Alcohol use: Yes    Alcohol/week: 10.0 standard drinks of alcohol    Types: 10 Glasses of wine per week   Drug use: No     No Known Allergies  Current Outpatient Medications  Medication Sig Dispense Refill   anastrozole (ARIMIDEX) 1 MG tablet Take 1 mg by mouth once a week.     Boron 6 MG TABS Take by mouth daily.     clonazepam (KLONOPIN) 0.125 MG disintegrating tablet Take 1 tablet (0.125 mg total) by mouth daily. In the morning as needed for anxiety 30 tablet 0   doxycycline (VIBRA-TABS) 100 MG tablet Take 1 tablet (100 mg total) by mouth 2 (two) times daily. 56 tablet 2   Multiple Vitamin (MULTIVITAMIN WITH MINERALS) TABS tablet Take 1 tablet by mouth 2 (two) times daily.     NON FORMULARY Sharp Thought Takes 2 capsule bid.     NP THYROID 60 MG tablet TAKE 1 TABLET BY MOUTH DAILY 90 tablet 0   OVER THE COUNTER MEDICATION Take 1 capsule by mouth 3 (three) times daily. Instaflex     OVER THE COUNTER MEDICATION Take 250 mg by mouth daily. Bio Magnesium Citrate     OVER THE COUNTER MEDICATION Take 20,000 mcg by mouth daily. Vitamin B12-methylfolate     OVER THE COUNTER MEDICATION Take 800 mcg by mouth daily. Active B Folate     OVER THE COUNTER MEDICATION 2 tablets daily. VitaPrime     OVER THE COUNTER MEDICATION 1,000 mg daily. Super Bio-C buffered     OVER THE COUNTER MEDICATION 1,000 mg daily. L'Arginine     Saw Palmetto 160 MG TABS daily in the afternoon.     sildenafil (VIAGRA) 50 MG tablet USE AS DIRECTED 90 tablet 0   tadalafil (CIALIS) 20 MG tablet TAKE ONE TABLET DAILY AS NEEDED FOR ERECTILE DYSFUNCTION 240 tablet 0   tamsulosin (FLOMAX) 0.4 MG CAPS capsule  TAKE 1 CAPSULE BY MOUTH ONCE DAILY 90 capsule 1   Testosterone 75 MG PLLT by Implant route.     zolpidem (AMBIEN CR) 6.25 MG CR tablet TAKE 1 TABLET BY MOUTH AT BEDTIME AS NEEDED FOR SLEEP 90 tablet 0   No current facility-administered medications for this visit.    OBJECTIVE: Vitals:   02/07/22 1110  BP: (!) 146/81  Pulse: 75  Resp: 18  Temp: 98.3 F (36.8 C)     Body mass index is 22.97 kg/m.      General: Well-developed, well-nourished, no acute distress. Eyes: Pink conjunctiva, anicteric sclera. HEENT: Normocephalic, moist mucous membranes,  clear oropharnyx. Lungs: Clear to auscultation bilaterally. Heart: Regular rate and rhythm. No rubs, murmurs, or gallops. Abdomen: Soft, nontender, nondistended. No organomegaly noted, normoactive bowel sounds. Musculoskeletal: No edema, cyanosis, or clubbing. Neuro: Alert, answering all questions appropriately. Cranial nerves grossly intact. Skin: No rashes or petechiae noted. Psych: Normal affect. Lymphatics: No cervical, calvicular, axillary or inguinal LAD.   LAB RESULTS:  Lab Results  Component Value Date   NA 140 02/02/2022   K 4.0 02/02/2022   CL 103 02/02/2022   CO2 28 02/02/2022   GLUCOSE 105 (H) 02/02/2022   BUN 18 02/02/2022   CREATININE 1.13 02/02/2022   CALCIUM 9.4 02/02/2022   PROT 6.7 02/02/2022   ALBUMIN 4.2 02/02/2022   AST 41 (H) 02/02/2022   ALT 39 02/02/2022   ALKPHOS 89 02/02/2022   BILITOT 1.3 (H) 02/02/2022   GFRNONAA 77 04/30/2019   GFRAA 90 04/30/2019    Lab Results  Component Value Date   WBC 4.0 02/02/2022   NEUTROABS 2.8 02/02/2022   HGB 14.4 02/02/2022   HCT 41.8 02/02/2022   MCV 99.6 02/02/2022   PLT 139.0 (L) 02/02/2022    Lab Results  Component Value Date   TIBC 326 02/02/2022   TIBC 354 12/26/2015   FERRITIN 309 02/02/2022   FERRITIN 490 04/30/2019   FERRITIN 248 02/17/2018   IRONPCTSAT 59 (H) 02/02/2022   IRONPCTSAT 33 12/26/2015     STUDIES: No results  found.  ASSESSMENT AND PLAN:   Tony Luna is a 81 y.o. male with pmh of hypothyroidism, liver cirrhosis, and mild AR was seen in hematology clinic for elevated iron and iron saturation.  # Elevated ferritin  # Elevated iron saturation  # Liver cirrhosis  - Ferritin in 01/2018 248, in 04/2019 490, 01/2022 309. - Percent saturation in 11/2015 of 33, in 01/2022 59%  Patient reports diagnosis of liver cirrhosis in 2016. US liver elastography on 07/31/2016 showed morphologic features of cirrhosis, fibrosis score of some F3 plus F4 with high risk of fibrosis.  Hepatitis B and C done in 2017 was negative.  He reports drinking 2 to 3 glasses of wine every day.  Of chart, he did not have other testing looking for etiology of cirrhosis.  BMI is normal. In setting of elevated ferritin, history of liver cirrhosis, and hypogonadism I will perform hemochromatosis H63D and C282Y testing.  # Thrombocytopenia  - could be related to liver cirrhosis -We will obtain vitamin B12 level folate and hepatitis testing to rule out other causes.  Orders Placed This Encounter  Procedures   Vitamin B12   Folate   Hepatitis panel, acute   Hemochromatosis DNA-PCR(c282y,h63d)   RTC in 3 weeks for MD visit to discuss labs.  Patient expressed understanding and was in agreement with this plan. He also understands that He can call clinic at any time with any questions, concerns, or complaints.   I spent a total of 45 minutes reviewing chart data, face-to-face evaluation with the patient, counseling and coordination of care as detailed above.  Jane Canary, MD   02/07/2022 12:03 PM

## 2022-02-07 NOTE — Progress Notes (Signed)
Patient here today for initial evaluation for elevated iron.

## 2022-02-09 ENCOUNTER — Other Ambulatory Visit: Payer: Self-pay | Admitting: Family

## 2022-02-13 LAB — HEMOCHROMATOSIS DNA-PCR(C282Y,H63D)

## 2022-02-14 ENCOUNTER — Telehealth: Payer: Self-pay

## 2022-02-14 NOTE — Telephone Encounter (Signed)
LVM for pt to return my call. Colonoscopy due with Dr. Allen Norris 04/18/22.  Thanks,  [place], [place]

## 2022-02-16 ENCOUNTER — Other Ambulatory Visit: Payer: Self-pay

## 2022-02-16 DIAGNOSIS — Z86010 Personal history of colonic polyps: Secondary | ICD-10-CM

## 2022-02-16 MED ORDER — NA SULFATE-K SULFATE-MG SULF 17.5-3.13-1.6 GM/177ML PO SOLN: 1.0000 | Freq: Once | ORAL | 0 refills | Status: AC

## 2022-02-16 NOTE — Progress Notes (Signed)
Gastroenterology Pre-Procedure Review  Request Date: 04/24/22 Requesting Physician: Dr. Allen Norris  PATIENT REVIEW QUESTIONS: The patient responded to the following health history questions as indicated:    1. Are you having any GI issues?  2. Do you have a personal history of Polyps?  3. Do you have a family history of Colon Cancer or Polyps?  4. Diabetes Mellitus?  5. Joint replacements in the past 12 months? 6. Major health problems in the past 3 months? 7. Any artificial heart valves, MVP, or defibrillator?    MEDICATIONS & ALLERGIES:    Patient reports the following regarding taking any anticoagulation/antiplatelet therapy:   Plavix, Coumadin, Eliquis, Xarelto, Lovenox, Pradaxa, Brilinta, or Effient?  Aspirin?   Patient confirms/reports the following medications:  Current Outpatient Medications  Medication Sig Dispense Refill   anastrozole (ARIMIDEX) 1 MG tablet Take 1 mg by mouth once a week.     Boron 6 MG TABS Take by mouth daily.     clonazepam (KLONOPIN) 0.125 MG disintegrating tablet Take 1 tablet (0.125 mg total) by mouth daily. In the morning as needed for anxiety 30 tablet 0   doxycycline (VIBRA-TABS) 100 MG tablet Take 1 tablet (100 mg total) by mouth 2 (two) times daily. 56 tablet 2   Multiple Vitamin (MULTIVITAMIN WITH MINERALS) TABS tablet Take 1 tablet by mouth 2 (two) times daily.     NON FORMULARY Sharp Thought Takes 2 capsule bid.     NP THYROID 60 MG tablet TAKE 1 TABLET BY MOUTH DAILY 90 tablet 0   OVER THE COUNTER MEDICATION Take 1 capsule by mouth 3 (three) times daily. Instaflex     OVER THE COUNTER MEDICATION Take 250 mg by mouth daily. Bio Magnesium Citrate     OVER THE COUNTER MEDICATION Take 20,000 mcg by mouth daily. Vitamin B12-methylfolate     OVER THE COUNTER MEDICATION Take 800 mcg by mouth daily. Active B Folate     OVER THE COUNTER MEDICATION 2 tablets daily. VitaPrime     OVER THE COUNTER MEDICATION 1,000 mg daily. Super Bio-C buffered     OVER THE  COUNTER MEDICATION 1,000 mg daily. L'Arginine     Saw Palmetto 160 MG TABS daily in the afternoon.     sildenafil (VIAGRA) 50 MG tablet USE AS DIRECTED 90 tablet 0   tadalafil (CIALIS) 20 MG tablet TAKE ONE TABLET DAILY AS NEEDED FOR ERECTILE DYSFUNCTION 240 tablet 0   tamsulosin (FLOMAX) 0.4 MG CAPS capsule TAKE 1 CAPSULE BY MOUTH ONCE DAILY 90 capsule 1   Testosterone 75 MG PLLT by Implant route.     zolpidem (AMBIEN CR) 6.25 MG CR tablet TAKE 1 TABLET BY MOUTH AT BEDTIME AS NEEDED FOR SLEEP 90 tablet 0   No current facility-administered medications for this visit.    Patient confirms/reports the following allergies:  No Known Allergies  No orders of the defined types were placed in this encounter.   AUTHORIZATION INFORMATION Primary Insurance: 1D#: Group #:  Secondary Insurance: 1D#: Group #:  SCHEDULE INFORMATION: Date: 04/24/22 Time: Location: [place]

## 2022-02-22 ENCOUNTER — Ambulatory Visit (INDEPENDENT_AMBULATORY_CARE_PROVIDER_SITE_OTHER): Payer: PPO

## 2022-02-22 DIAGNOSIS — M816 Localized osteoporosis [Lequesne]: Secondary | ICD-10-CM | POA: Diagnosis not present

## 2022-02-22 MED ORDER — DENOSUMAB 60 MG/ML ~~LOC~~ SOSY
60.0000 mg | PREFILLED_SYRINGE | Freq: Once | SUBCUTANEOUS | Status: AC
  Administered 2022-02-22: 60 mg via SUBCUTANEOUS

## 2022-02-22 NOTE — Progress Notes (Signed)
Patient presented for Prolia injection to left arm ,sq patient voiced no concerns nor showed any signs of rejection

## 2022-02-27 ENCOUNTER — Inpatient Hospital Stay: Payer: PPO | Attending: Internal Medicine | Admitting: Internal Medicine

## 2022-02-27 ENCOUNTER — Encounter: Payer: Self-pay | Admitting: Internal Medicine

## 2022-02-27 DIAGNOSIS — K7460 Unspecified cirrhosis of liver: Secondary | ICD-10-CM | POA: Diagnosis not present

## 2022-02-27 DIAGNOSIS — R7989 Other specified abnormal findings of blood chemistry: Secondary | ICD-10-CM | POA: Diagnosis not present

## 2022-02-27 DIAGNOSIS — E039 Hypothyroidism, unspecified: Secondary | ICD-10-CM | POA: Insufficient documentation

## 2022-02-27 DIAGNOSIS — D696 Thrombocytopenia, unspecified: Secondary | ICD-10-CM | POA: Diagnosis not present

## 2022-02-27 NOTE — Progress Notes (Signed)
[place]  Telephone:([phone]) [phone] Fax:(336) [phone]  ID: Tony Luna OB: 29-May-1940  MR#: [id number]  RCV#:[id number]  Patient Care Team: [name], MD as PCP - General (Internal Medicine) Kate Sable, MD as PCP - Cardiology (Cardiology)  REFERRING PROVIDER: Dr. Derrel Nip  REASON FOR REFERRAL: Elevated ferritin and iron saturation  HPI: Tony Luna is a 81 y.o. male with past medical history of hypothyroidism, liver cirrhosis, and mild AR was seen in hematology clinic for elevated iron and iron saturation.  Patient reports diagnosis of liver cirrhosis in 2016. US liver elastography on 07/31/2016 showed morphologic features of cirrhosis, fibrosis score of some F3 plus F4 with high risk of fibrosis.  Hepatitis B and C done in 2017 was negative.  He reports drinking 2 to 3 glasses of wine every day. He was seen by Dr. 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 He follows with Dr. Derrel Nip his PCP for surveillance of [facility].  He reports arthritis in back, shoulder and left knee. He follows with Dr. Bernardo Heater for BPH and erectile dysfunction and uses sildenafil intermittently.  Of chart, he is also seeing urology in [facility] for hypogonadism and subcutaneous testosterone pellet implantation. Echocardiogram in November 2021 showed normal EF with grade 1 diastolic dysfunction and mild AR. He denies any family history of iron overload or hemochromatosis. Denies skin pigmentation.   Patient denies fever, chills, nausea, vomiting, shortness of breath, cough, abdominal pain, bleeding, bowel or bladder issues. Energy level is good.  Appetite is good.  Denies any weight loss. Denies pain.  Labs from 02/02/2022 -CBC showed WBC of 4, hemoglobin 14.4, platelet 139.  He has chronic thrombocytopenia with platelets ranging 110 to 140s since 2016.  AST mildly elevated to 41, ALT normal, T. bili 1.3. Ferritin in 01/2018 248, in 04/2019 490, 01/2022 309. Percent saturation in  11/2015 of 33, in 01/2022 59%  INTERVAL HISTORY-  Patient seen today to discuss labs results.    REVIEW OF SYSTEMS:   ROS  As per HPI. Otherwise, a complete review of systems is negative.  PAST MEDICAL HISTORY: Past Medical History:  Diagnosis Date   Allergy    Arthritis    Cirrhosis ([place])    Complication of anesthesia    :"need catheter"   Dysrhythmia    ? something told by reg dr  unable to say what it was   Esophageal tear    Hypothyroidism    Pneumonia    hx   Primary osteoarthritis of left knee 06/22/2014   Rotator cuff tear     PAST SURGICAL HISTORY: Past Surgical History:  Procedure Laterality Date   BACK SURGERY     blepharaplasty     CATARACT EXTRACTION W/PHACO Left 02/21/2021   Procedure: CATARACT EXTRACTION PHACO AND INTRAOCULAR LENS PLACEMENT ([place]) LEFT 4.56 00:34.9;  Surgeon: Birder Robson, MD;  Location: [place];  Service: Ophthalmology;  Laterality: Left;   CAUTERY OF TURBINATES     COLONOSCOPY WITH PROPOFOL N/A 07/19/2015   Procedure: COLONOSCOPY WITH PROPOFOL;  Surgeon: [name], MD;  Location: [facility] ENDOSCOPY;  Service: Endoscopy;  Laterality: N/A;   COLONOSCOPY WITH PROPOFOL N/A 03/21/2021   Procedure: COLONOSCOPY WITH PROPOFOL;  Surgeon: [name], MD;  Location: [facility] ENDOSCOPY;  Service: Endoscopy;  Laterality: N/A;   COLONOSCOPY WITH PROPOFOL N/A 04/18/2021   Procedure: COLONOSCOPY WITH PROPOFOL;  Surgeon: [name], MD;  Location: [facility] ENDOSCOPY;  Service: Endoscopy;  Laterality: N/A;   EYE SURGERY  HERNIA REPAIR Left    inguinal herniorrhapy   KNEE SURGERY     ? loose body/ chondral defect 20yr   ROTATOR CUFF REPAIR     left   SPINE SURGERY     TONSILLECTOMY     TOTAL KNEE ARTHROPLASTY Left 06/22/2014   Procedure: TOTAL KNEE ARTHROPLASTY;  Surgeon: JJohnny Bridge MD;  [place]  Service: Orthopedics;  Laterality: Left;    FAMILY HISTORY: Family History  Problem Relation Age of Onset   Alcohol abuse  Mother    Diabetes Mother    Mental retardation Mother     HEALTH MAINTENANCE: Social History   Tobacco Use   Smoking status: Former    Packs/day: 1.00    Years: 15.00    Total pack years: 15.00    Types: Cigarettes    Quit date: 06/15/1983    Years since quitting: 38.7   Smokeless tobacco: Current    Types: Chew  Vaping Use   Vaping Use: Never used  Substance Use Topics   Alcohol use: Yes    Alcohol/week: 10.0 standard drinks of alcohol    Types: 10 Glasses of wine per week   Drug use: No     No Known Allergies  Current Outpatient Medications  Medication Sig Dispense Refill   anastrozole (ARIMIDEX) 1 MG tablet Take 1 mg by mouth once a week.     Boron 6 MG TABS Take by mouth daily.     clonazepam (KLONOPIN) 0.125 MG disintegrating tablet Take 1 tablet (0.125 mg total) by mouth daily. In the morning as needed for anxiety 30 tablet 0   doxycycline (VIBRA-TABS) 100 MG tablet Take 1 tablet (100 mg total) by mouth 2 (two) times daily. 56 tablet 2   Multiple Vitamin (MULTIVITAMIN WITH MINERALS) TABS tablet Take 1 tablet by mouth 2 (two) times daily.     NON FORMULARY Sharp Thought Takes 2 capsule bid.     NP THYROID 60 MG tablet TAKE 1 TABLET BY MOUTH DAILY 90 tablet 0   OVER THE COUNTER MEDICATION Take 1 capsule by mouth 3 (three) times daily. Instaflex     OVER THE COUNTER MEDICATION Take 250 mg by mouth daily. Bio Magnesium Citrate     OVER THE COUNTER MEDICATION Take 20,000 mcg by mouth daily. Vitamin B12-methylfolate     OVER THE COUNTER MEDICATION Take 800 mcg by mouth daily. Active B Folate     OVER THE COUNTER MEDICATION 2 tablets daily. VitaPrime     OVER THE COUNTER MEDICATION 1,000 mg daily. Super Bio-C buffered     OVER THE COUNTER MEDICATION 1,000 mg daily. L'Arginine     Saw Palmetto 160 MG TABS daily in the afternoon.     sildenafil (VIAGRA) 50 MG tablet USE AS DIRECTED 90 tablet 0   tadalafil (CIALIS) 20 MG tablet TAKE ONE TABLET DAILY AS NEEDED FOR ERECTILE  DYSFUNCTION 240 tablet 0   tamsulosin (FLOMAX) 0.4 MG CAPS capsule TAKE 1 CAPSULE BY MOUTH ONCE DAILY 90 capsule 1   Testosterone 75 MG PLLT by Implant route.     zolpidem (AMBIEN CR) 6.25 MG CR tablet TAKE 1 TABLET BY MOUTH AT BEDTIME AS NEEDED FOR SLEEP 90 tablet 0   No current facility-administered medications for this visit.    OBJECTIVE: Vitals:   02/27/22 1329  BP: 128/71  Pulse: 98  Resp: 18  Temp: 98.7 F (37.1 C)     Body mass index is 22.89 kg/m.  General: Well-developed, well-nourished, no acute distress. Eyes: Pink conjunctiva, anicteric sclera. HEENT: Normocephalic, moist mucous membranes, clear oropharnyx. Lungs: Clear to auscultation bilaterally. Heart: Regular rate and rhythm. No rubs, murmurs, or gallops. Abdomen: Soft, nontender, nondistended. No organomegaly noted, normoactive bowel sounds. Musculoskeletal: No edema, cyanosis, or clubbing. Neuro: Alert, answering all questions appropriately. Cranial nerves grossly intact. Skin: No rashes or petechiae noted. Psych: Normal affect. Lymphatics: No cervical, calvicular, axillary or inguinal LAD.   LAB RESULTS:  Lab Results  Component Value Date   NA 140 02/02/2022   K 4.0 02/02/2022   CL 103 02/02/2022   CO2 28 02/02/2022   GLUCOSE 105 (H) 02/02/2022   BUN 18 02/02/2022   CREATININE 1.13 02/02/2022   CALCIUM 9.4 02/02/2022   PROT 6.7 02/02/2022   ALBUMIN 4.2 02/02/2022   AST 41 (H) 02/02/2022   ALT 39 02/02/2022   ALKPHOS 89 02/02/2022   BILITOT 1.3 (H) 02/02/2022   GFRNONAA 77 04/30/2019   GFRAA 90 04/30/2019    Lab Results  Component Value Date   WBC 4.0 02/02/2022   NEUTROABS 2.8 02/02/2022   HGB 14.4 02/02/2022   HCT 41.8 02/02/2022   MCV 99.6 02/02/2022   PLT 139.0 (L) 02/02/2022    Lab Results  Component Value Date   TIBC 326 02/02/2022   TIBC 354 12/26/2015   FERRITIN 309 02/02/2022   FERRITIN 490 04/30/2019   FERRITIN 248 02/17/2018   IRONPCTSAT 59 (H) 02/02/2022    IRONPCTSAT 33 12/26/2015     STUDIES: No results found.  ASSESSMENT AND PLAN:   MACGUIRE HOLSINGER is a 81 y.o. male with pmh of hypothyroidism, liver cirrhosis, and mild AR was seen in hematology clinic for elevated iron and iron saturation.  # Iron overload # Liver cirrhosis  - Ferritin in 01/2018 248, in 04/2019 490, 01/2022 309. - Percent saturation in 11/2015 of 33, in 01/2022 59%  - Patient reports diagnosis of liver cirrhosis in 2016. US liver elastography on 07/31/2016 showed morphologic features of cirrhosis, fibrosis score of some F3 plus F4 with high risk of fibrosis.  Hepatitis B and C done in 2017 was negative.  He reports drinking 2 to 3 glasses of wine every day. He was seen by Dr. 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 Denies family hx of iron overload.   - Hemochromatosis gene panel for H63D and C282Y negative.  - Discussed about obtaining MRI liver T2/R2 weighted to assess for hepatic iron stores which will help to confirm if iron overload is secondary to liver disease and not vice versa. Can also assist about benefits of phlebotomy. Patient agreeable with plan.   # Thrombocytopenia  - likely secondary to liver cirrhosis - b12, folate normal - hepatitis B and C negative   Orders Placed This Encounter  Procedures   MR LIVER W WO CONTRAST   RTC in 6 weeks for MD visit to mri.  Patient expressed understanding and was in agreement with this plan. He also understands that He can call clinic at any time with any questions, concerns, or complaints.   I spent a total of 35 minutes reviewing chart data, face-to-face evaluation with the patient, counseling and coordination of care as detailed above.  Jane Canary, MD   02/28/2022 8:46 AM

## 2022-02-27 NOTE — Progress Notes (Unsigned)
Patient denies any concerns today.  

## 2022-03-01 ENCOUNTER — Ambulatory Visit
Admission: RE | Admit: 2022-03-01 | Discharge: 2022-03-01 | Disposition: A | Discharge status: Home / Self Care | Payer: PPO | Source: Ambulatory Visit | Attending: Internal Medicine | Admitting: Internal Medicine

## 2022-03-01 DIAGNOSIS — K7460 Unspecified cirrhosis of liver: Secondary | ICD-10-CM | POA: Diagnosis not present

## 2022-03-01 DIAGNOSIS — D696 Thrombocytopenia, unspecified: Secondary | ICD-10-CM | POA: Insufficient documentation

## 2022-03-01 DIAGNOSIS — R161 Splenomegaly, not elsewhere classified: Secondary | ICD-10-CM | POA: Diagnosis not present

## 2022-03-19 ENCOUNTER — Telehealth: Payer: Self-pay | Admitting: Internal Medicine

## 2022-03-19 MED ORDER — THYROID 60 MG PO TABS: 60.0000 mg | ORAL_TABLET | Freq: Every day | ORAL | 1 refills | Status: DC

## 2022-03-19 NOTE — Telephone Encounter (Signed)
Medication has been refilled. LM letting pt know that medication has been refilled.

## 2022-03-19 NOTE — Telephone Encounter (Signed)
Patient has been trying to get his NP THYROID 60 MG tablet refill since last week. He tool his last pill this morning and is out of medication.

## 2022-03-19 NOTE — Addendum Note (Signed)
Addended by: Adair Laundry on: 03/19/2022 05:16 PM   Modules accepted: Orders

## 2022-03-27 DIAGNOSIS — E291 Testicular hypofunction: Secondary | ICD-10-CM | POA: Diagnosis not present

## 2022-04-05 ENCOUNTER — Ambulatory Visit
Admission: RE | Admit: 2022-04-05 | Discharge: 2022-04-05 | Disposition: A | Discharge status: Home / Self Care | Payer: PPO | Source: Ambulatory Visit | Attending: Internal Medicine | Admitting: Internal Medicine

## 2022-04-05 DIAGNOSIS — R6882 Decreased libido: Secondary | ICD-10-CM | POA: Diagnosis not present

## 2022-04-05 DIAGNOSIS — M2550 Pain in unspecified joint: Secondary | ICD-10-CM | POA: Diagnosis not present

## 2022-04-05 DIAGNOSIS — K5730 Diverticulosis of large intestine without perforation or abscess without bleeding: Secondary | ICD-10-CM | POA: Diagnosis not present

## 2022-04-05 DIAGNOSIS — Z6822 Body mass index (BMI) 22.0-22.9, adult: Secondary | ICD-10-CM | POA: Diagnosis not present

## 2022-04-05 DIAGNOSIS — E038 Other specified hypothyroidism: Secondary | ICD-10-CM | POA: Diagnosis not present

## 2022-04-05 DIAGNOSIS — K7460 Unspecified cirrhosis of liver: Secondary | ICD-10-CM | POA: Diagnosis not present

## 2022-04-05 DIAGNOSIS — Z79890 Hormone replacement therapy (postmenopausal): Secondary | ICD-10-CM | POA: Diagnosis not present

## 2022-04-05 DIAGNOSIS — E291 Testicular hypofunction: Secondary | ICD-10-CM | POA: Diagnosis not present

## 2022-04-05 DIAGNOSIS — N529 Male erectile dysfunction, unspecified: Secondary | ICD-10-CM | POA: Diagnosis not present

## 2022-04-05 MED ORDER — GADOBUTROL 1 MMOL/ML IV SOLN
7.5000 mL | Freq: Once | INTRAVENOUS | Status: AC | PRN
  Administered 2022-04-05: 7.5 mL via INTRAVENOUS

## 2022-04-09 ENCOUNTER — Ambulatory Visit: Payer: PPO | Admitting: Urology

## 2022-04-10 ENCOUNTER — Inpatient Hospital Stay: Payer: PPO | Admitting: Internal Medicine

## 2022-04-12 ENCOUNTER — Inpatient Hospital Stay: Payer: PPO | Attending: Internal Medicine | Admitting: Internal Medicine

## 2022-04-12 ENCOUNTER — Inpatient Hospital Stay: Payer: PPO

## 2022-04-12 ENCOUNTER — Encounter: Payer: Self-pay | Admitting: Internal Medicine

## 2022-04-12 DIAGNOSIS — R7989 Other specified abnormal findings of blood chemistry: Secondary | ICD-10-CM | POA: Insufficient documentation

## 2022-04-12 DIAGNOSIS — K7460 Unspecified cirrhosis of liver: Secondary | ICD-10-CM | POA: Insufficient documentation

## 2022-04-12 DIAGNOSIS — D696 Thrombocytopenia, unspecified: Secondary | ICD-10-CM | POA: Diagnosis not present

## 2022-04-12 DIAGNOSIS — E039 Hypothyroidism, unspecified: Secondary | ICD-10-CM | POA: Diagnosis not present

## 2022-04-12 LAB — CBC WITH DIFFERENTIAL/PLATELET
Abs Immature Granulocytes: 0.01 10*3/uL (ref 0.00–0.07)
Basophils Absolute: 0 10*3/uL (ref 0.0–0.1)
Basophils Relative: 1 %
Eosinophils Absolute: 0.1 10*3/uL (ref 0.0–0.5)
Eosinophils Relative: 3 %
HCT: 39 % (ref 39.0–52.0)
Hemoglobin: 14 g/dL (ref 13.0–17.0)
Immature Granulocytes: 0 %
Lymphocytes Relative: 27 %
Lymphs Abs: 1.3 10*3/uL (ref 0.7–4.0)
MCH: 34.7 pg — ABNORMAL HIGH (ref 26.0–34.0)
MCHC: 35.9 g/dL (ref 30.0–36.0)
MCV: 96.5 fL (ref 80.0–100.0)
Monocytes Absolute: 0.4 10*3/uL (ref 0.1–1.0)
Monocytes Relative: 8 %
Neutro Abs: 2.9 10*3/uL (ref 1.7–7.7)
Neutrophils Relative %: 61 %
Platelets: 139 10*3/uL — ABNORMAL LOW (ref 150–400)
RBC: 4.04 MIL/uL — ABNORMAL LOW (ref 4.22–5.81)
RDW: 11.4 % — ABNORMAL LOW (ref 11.5–15.5)
WBC: 4.7 10*3/uL (ref 4.0–10.5)
nRBC: 0 % (ref 0.0–0.2)

## 2022-04-12 NOTE — Progress Notes (Signed)
[place]  [phone] [phone]  ID: Tony Luna OB: [birth year]  MR#: [id number]  QJF#:[id number]  Patient Care Team: [name], MD as PCP - General (Internal Medicine) Kate Sable, MD as PCP - Cardiology (Cardiology)  REFERRING PROVIDER: Dr. Derrel Nip  REASON FOR REFERRAL: Elevated ferritin and iron saturation  HPI: Tony Luna is a 81 y.o. male with past medical history of hypothyroidism, liver cirrhosis, and mild AR was seen in hematology clinic for elevated iron and iron saturation.  Patient reports diagnosis of liver cirrhosis in 2016. US liver elastography on 07/31/2016 showed morphologic features of cirrhosis, fibrosis score of some F3 plus F4 with high risk of fibrosis.  Hepatitis B and C done in 2017 was negative.  He reports drinking 2 to 3 glasses of wine every day. He was seen by Dr. 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 He follows with Dr. Derrel Nip his PCP for surveillance of [facility].  He reports arthritis in back, shoulder and left knee. He follows with Dr. Bernardo Heater for BPH and erectile dysfunction and uses sildenafil intermittently.  Of chart, he is also seeing urology in [facility] for hypogonadism and subcutaneous testosterone pellet implantation. Echocardiogram in November 2021 showed normal EF with grade 1 diastolic dysfunction and mild AR. He denies any family history of iron overload or hemochromatosis. Denies skin pigmentation.   Patient denies fever, chills, nausea, vomiting, shortness of breath, cough, abdominal pain, bleeding, bowel or bladder issues. Energy level is good.  Appetite is good.  Denies any weight loss. Denies pain.  Labs from 02/02/2022 -CBC showed WBC of 4, hemoglobin 14.4, platelet 139.  He has chronic thrombocytopenia with platelets ranging 110 to 140s since 2016.  AST mildly elevated to 41, ALT normal, T. bili 1.3. Ferritin in 01/2018 248, in 04/2019 490, 01/2022 309. Percent saturation in  11/2015 of 33, in 01/2022 59%  INTERVAL HISTORY-  Patient seen today to discuss MRI liver and next steps. Discussed with the patient that appropriate MRI imaging was not performed.  He has been feeling well overall.  He continues with ongoing to gym, hunting.  Patient denies fever, chills, nausea, vomiting, shortness of breath, cough, abdominal pain, bleeding, bowel or bladder issues. Energy level is good.  Appetite is good.  Denies any weight loss. Denies pain.   REVIEW OF SYSTEMS:   Review of Systems  All other systems reviewed and are negative.   As per HPI. Otherwise, a complete review of systems is negative.  PAST MEDICAL HISTORY: Past Medical History:  Diagnosis Date   Allergy    Arthritis    Cirrhosis ([place])    Complication of anesthesia    :"need catheter"   Dysrhythmia    ? something told by reg dr  unable to say what it was   Esophageal tear    Hypothyroidism    Pneumonia    hx   Primary osteoarthritis of left knee 06/22/2014   Rotator cuff tear     PAST SURGICAL HISTORY: Past Surgical History:  Procedure Laterality Date   BACK SURGERY     blepharaplasty     CATARACT EXTRACTION W/PHACO Left 02/21/2021   Procedure: CATARACT EXTRACTION PHACO AND INTRAOCULAR LENS PLACEMENT ([place]) LEFT 4.56 00:34.9;  Surgeon: Birder Robson, MD;  Location: [place];  Service: Ophthalmology;  Laterality: Left;   CAUTERY OF TURBINATES     COLONOSCOPY WITH PROPOFOL N/A 07/19/2015   Procedure: COLONOSCOPY WITH PROPOFOL;  Surgeon: [name], MD;  Location: [facility] ENDOSCOPY;  Service: Endoscopy;  Laterality: N/A;   COLONOSCOPY WITH PROPOFOL N/A 03/21/2021   Procedure: COLONOSCOPY WITH PROPOFOL;  Surgeon: [name], MD;  Location: [facility] ENDOSCOPY;  Service: Endoscopy;  Laterality: N/A;   COLONOSCOPY WITH PROPOFOL N/A 04/18/2021   Procedure: COLONOSCOPY WITH PROPOFOL;  Surgeon: [name], MD;  Location: [facility] ENDOSCOPY;  Service: Endoscopy;  Laterality: N/A;   EYE SURGERY      HERNIA REPAIR Left    inguinal herniorrhapy   KNEE SURGERY     ? loose body/ chondral defect 22yr   ROTATOR CUFF REPAIR     left   SPINE SURGERY     TONSILLECTOMY     TOTAL KNEE ARTHROPLASTY Left 06/22/2014   Procedure: TOTAL KNEE ARTHROPLASTY;  Surgeon: JJohnny Bridge MD;  [place]  Service: Orthopedics;  Laterality: Left;    FAMILY HISTORY: Family History  Problem Relation Age of Onset   Alcohol abuse Mother    Diabetes Mother    Mental retardation Mother     HEALTH MAINTENANCE: Social History   Tobacco Use   Smoking status: Former    Packs/day: 1.00    Years: 15.00    Total pack years: 15.00    Types: Cigarettes    Quit date: 06/15/1983    Years since quitting: 38.8   Smokeless tobacco: Current    Types: Chew  Vaping Use   Vaping Use: Never used  Substance Use Topics   Alcohol use: Yes    Alcohol/week: 10.0 standard drinks of alcohol    Types: 10 Glasses of wine per week   Drug use: No     No Known Allergies  Current Outpatient Medications  Medication Sig Dispense Refill   anastrozole (ARIMIDEX) 1 MG tablet Take 1 mg by mouth once a week.     Boron 6 MG TABS Take by mouth daily.     clonazepam (KLONOPIN) 0.125 MG disintegrating tablet Take 1 tablet (0.125 mg total) by mouth daily. In the morning as needed for anxiety 30 tablet 0   doxycycline (VIBRA-TABS) 100 MG tablet Take 1 tablet (100 mg total) by mouth 2 (two) times daily. 56 tablet 2   Multiple Vitamin (MULTIVITAMIN WITH MINERALS) TABS tablet Take 1 tablet by mouth 2 (two) times daily.     NON FORMULARY Sharp Thought Takes 2 capsule bid.     OVER THE COUNTER MEDICATION Take 1 capsule by mouth 3 (three) times daily. Instaflex     OVER THE COUNTER MEDICATION Take 250 mg by mouth daily. Bio Magnesium Citrate     OVER THE COUNTER MEDICATION Take 20,000 mcg by mouth daily. Vitamin B12-methylfolate     OVER THE COUNTER MEDICATION Take 800 mcg by mouth daily. Active B Folate     OVER THE  COUNTER MEDICATION 2 tablets daily. VitaPrime     OVER THE COUNTER MEDICATION 1,000 mg daily. Super Bio-C buffered     OVER THE COUNTER MEDICATION 1,000 mg daily. L'Arginine     Saw Palmetto 160 MG TABS daily in the afternoon.     sildenafil (VIAGRA) 50 MG tablet USE AS DIRECTED 90 tablet 0   tadalafil (CIALIS) 20 MG tablet TAKE ONE TABLET DAILY AS NEEDED FOR ERECTILE DYSFUNCTION 240 tablet 0   tamsulosin (FLOMAX) 0.4 MG CAPS capsule TAKE 1 CAPSULE BY MOUTH ONCE DAILY 90 capsule 1   Testosterone 75 MG PLLT by Implant route.     thyroid (NP THYROID) 60 MG tablet Take 1 tablet (60 mg total)  by mouth daily. 90 tablet 1   zolpidem (AMBIEN CR) 6.25 MG CR tablet TAKE 1 TABLET BY MOUTH AT BEDTIME AS NEEDED FOR SLEEP 90 tablet 0   No current facility-administered medications for this visit.    OBJECTIVE: Vitals:   04/12/22 1537  BP: (!) 158/90  Pulse: 80  Resp: 20  Temp: (!) 96.3 F (35.7 C)  SpO2: 100%     Body mass index is 23.38 kg/m.      General: Well-developed, well-nourished, no acute distress. Eyes: Pink conjunctiva, anicteric sclera. HEENT: Normocephalic, moist mucous membranes, clear oropharnyx. Lungs: Clear to auscultation bilaterally. Heart: Regular rate and rhythm. No rubs, murmurs, or gallops. Abdomen: Soft, nontender, nondistended. No organomegaly noted, normoactive bowel sounds. Musculoskeletal: No edema, cyanosis, or clubbing. Neuro: Alert, answering all questions appropriately. Cranial nerves grossly intact. Skin: No rashes or petechiae noted. Psych: Normal affect. Lymphatics: No cervical, calvicular, axillary or inguinal LAD.   LAB RESULTS:  Lab Results  Component Value Date   NA 140 02/02/2022   K 4.0 02/02/2022   CL 103 02/02/2022   CO2 28 02/02/2022   GLUCOSE 105 (H) 02/02/2022   BUN 18 02/02/2022   CREATININE 1.13 02/02/2022   CALCIUM 9.4 02/02/2022   PROT 6.7 02/02/2022   ALBUMIN 4.2 02/02/2022   AST 41 (H) 02/02/2022   ALT 39 02/02/2022    ALKPHOS 89 02/02/2022   BILITOT 1.3 (H) 02/02/2022   GFRNONAA 77 04/30/2019   GFRAA 90 04/30/2019    Lab Results  Component Value Date   WBC 4.7 04/12/2022   NEUTROABS 2.9 04/12/2022   HGB 14.0 04/12/2022   HCT 39.0 04/12/2022   MCV 96.5 04/12/2022   PLT 139 (L) 04/12/2022    Lab Results  Component Value Date   TIBC 326 02/02/2022   TIBC 354 12/26/2015   FERRITIN 309 02/02/2022   FERRITIN 490 04/30/2019   FERRITIN 248 02/17/2018   IRONPCTSAT 59 (H) 02/02/2022   IRONPCTSAT 33 12/26/2015     STUDIES: MR LIVER W WO CONTRAST  Result Date: 04/08/2022 CLINICAL DATA:  Cirrhosis EXAM: MRI ABDOMEN WITHOUT AND WITH CONTRAST TECHNIQUE: Multiplanar multisequence MR imaging of the abdomen was performed both before and after the administration of intravenous contrast. CONTRAST:  7.38m GADAVIST GADOBUTROL 1 MMOL/ML IV SOLN COMPARISON:  CT abdomen pelvis, 06/27/2021 FINDINGS: Lower chest: No acute abnormality. Hepatobiliary: Coarse, nodular contour of the liver. No gallstones, gallbladder wall thickening, or biliary dilatation. Pancreas: Unremarkable. No pancreatic ductal dilatation or surrounding inflammatory changes. Spleen: Normal in size without significant abnormality. Adrenals/Urinary Tract: Adrenal glands are unremarkable. Kidneys are normal, without renal calculi, solid lesion, or hydronephrosis. Stomach/Bowel: Stomach is within normal limits. No evidence of bowel wall thickening, distention, or inflammatory changes. Colonic diverticulosis. Vascular/Lymphatic: Right adrenal varices (series 19, image 24). No enlarged abdominal lymph nodes. Other: No abdominal wall hernia or abnormality. No ascites. Musculoskeletal: No acute or significant osseous findings. IMPRESSION: 1. Cirrhosis. No evidence of suspicious mass or contrast enhancement. 2. Colonic diverticulosis. Electronically Signed   By: ADelanna AhmadiM.D.   On: 04/08/2022 15:09    ASSESSMENT AND PLAN:   HJAMAINE QUINTINis a 81y.o. male  with pmh of hypothyroidism, liver cirrhosis, and mild AR was seen in hematology clinic for elevated iron and iron saturation.  # Iron overload # Liver cirrhosis  - Ferritin in 01/2018 248, in 04/2019 490, 01/2022 309. - Percent saturation in 11/2015 of 33, in 01/2022 59%  - Patient reports diagnosis of liver cirrhosis in  2016. US liver elastography on 07/31/2016 showed morphologic features of cirrhosis, fibrosis score of some F3 plus F4 with high risk of fibrosis.  Hepatitis B and C done in 2017 was negative.  He reports drinking 2 to 3 glasses of wine every day. He was seen by Dr. 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 Denies family hx of iron overload.   - Hemochromatosis gene panel for H63D and C282Y negative.  -MRI liver was performed however T2/R2 weighted images were not done to assess for hepatic iron stores.  Discussed with the patient about doing phlebotomies to help reduce the iron stores.  The side effects such as dizziness during the procedure was discussed.  Patient was agreeable.  Will obtain CBC and iron studies today.  Scheduled for phlebotomy tomorrow or on Monday.  I will follow-up in 1 week with repeat labs and phlebotomy the following day if needed.   # Thrombocytopenia  - likely secondary to liver cirrhosis - b12, folate normal - hepatitis B and C negative   Orders Placed This Encounter  Procedures   CBC with Differential   Ferritin   Iron and TIBC([facility])   CBC with Differential   Iron and TIBC([facility])   Ferritin   RTC tomorrow for phlebotomy In 1 week for MD visit, labs.  Next day possible phlebotomy.  Patient expressed understanding and was in agreement with this plan. He also understands that He can call clinic at any time with any questions, concerns, or complaints.   I spent a total of 35 minutes reviewing chart data, face-to-face evaluation with the patient, counseling and coordination of care as detailed above.  Jane Canary, MD   04/12/2022 4:37 PM

## 2022-04-13 ENCOUNTER — Inpatient Hospital Stay: Payer: PPO

## 2022-04-13 ENCOUNTER — Telehealth: Payer: Self-pay | Admitting: Internal Medicine

## 2022-04-13 LAB — IRON AND TIBC
Iron: 115 ug/dL (ref 45–182)
Saturation Ratios: 32 % (ref 17.9–39.5)
TIBC: 358 ug/dL (ref 250–450)
UIBC: 243 ug/dL

## 2022-04-13 LAB — FERRITIN: Ferritin: 295 ng/mL (ref 24–336)

## 2022-04-13 NOTE — Telephone Encounter (Signed)
Iron panel is normal.  No indication for phlebotomy.  Will reschedule f/u in 3 months.

## 2022-04-23 ENCOUNTER — Ambulatory Visit: Payer: PPO | Admitting: Urology

## 2022-04-23 ENCOUNTER — Ambulatory Visit
Admission: RE | Admit: 2022-04-23 | Discharge: 2022-04-23 | Disposition: A | Discharge status: Home / Self Care | Payer: PPO | Source: Ambulatory Visit | Attending: Urology | Admitting: Urology

## 2022-04-23 ENCOUNTER — Ambulatory Visit
Admission: RE | Admit: 2022-04-23 | Discharge: 2022-04-23 | Disposition: A | Discharge status: Home / Self Care | Payer: PPO | Attending: Urology | Admitting: Urology

## 2022-04-23 ENCOUNTER — Ambulatory Visit: Payer: PPO | Admitting: Internal Medicine

## 2022-04-23 ENCOUNTER — Other Ambulatory Visit: Payer: Self-pay | Admitting: Family

## 2022-04-23 ENCOUNTER — Other Ambulatory Visit: Payer: PPO

## 2022-04-23 ENCOUNTER — Encounter: Payer: Self-pay | Admitting: Urology

## 2022-04-23 VITALS — BP 169/69 | HR 90 | Ht 72.0 in | Wt 165.0 lb

## 2022-04-23 DIAGNOSIS — M419 Scoliosis, unspecified: Secondary | ICD-10-CM | POA: Diagnosis not present

## 2022-04-23 DIAGNOSIS — N138 Other obstructive and reflux uropathy: Secondary | ICD-10-CM | POA: Insufficient documentation

## 2022-04-23 DIAGNOSIS — N401 Enlarged prostate with lower urinary tract symptoms: Secondary | ICD-10-CM

## 2022-04-23 DIAGNOSIS — M47816 Spondylosis without myelopathy or radiculopathy, lumbar region: Secondary | ICD-10-CM | POA: Diagnosis not present

## 2022-04-23 DIAGNOSIS — M160 Bilateral primary osteoarthritis of hip: Secondary | ICD-10-CM | POA: Diagnosis not present

## 2022-04-23 DIAGNOSIS — Z87442 Personal history of urinary calculi: Secondary | ICD-10-CM | POA: Diagnosis not present

## 2022-04-23 DIAGNOSIS — R3129 Other microscopic hematuria: Secondary | ICD-10-CM | POA: Diagnosis not present

## 2022-04-23 NOTE — Progress Notes (Signed)
04/23/2022 2:49 PM   Imagene Riches Jun 13, 1940 [id number]  Referring provider: [name], MD [place],  [place] [address]  Chief Complaint  Patient presents with   Nephrolithiasis    Urologic history:  1.  BPH with LUTS Tamsulosin 0.4 mg daily  2.  Erectile dysfunction PDE 5 inhibitor used intermittently  3.  Hypogonadism TRT with subcu testosterone pellets Managed by [facility] in [place]  4.  Microhematuria CTU 05/2021: 3 mm left renal calculus, BPH Cystoscopy 06/2021: Moderate lateral lobe enlargement with hypervascularity/prostatic calculi   HPI: 81 y.o. male presents for annual follow-up.  Doing well since last visit No bothersome LUTS Denies dysuria, gross hematuria Denies flank, abdominal or pelvic pain Continues with TRT followed in [facility] PSA 07/2021 stable 1.06   PMH: Past Medical History:  Diagnosis Date   Allergy    Arthritis    Cirrhosis ([place])    Complication of anesthesia    :"need catheter"   Dysrhythmia    ? something told by reg dr  unable to say what it was   Esophageal tear    Hypothyroidism    Pneumonia    hx   Primary osteoarthritis of left knee 06/22/2014   Rotator cuff tear     Surgical History: Past Surgical History:  Procedure Laterality Date   BACK SURGERY     blepharaplasty     CATARACT EXTRACTION W/PHACO Left 02/21/2021   Procedure: CATARACT EXTRACTION PHACO AND INTRAOCULAR LENS PLACEMENT ([place]) LEFT 4.56 00:34.9;  Surgeon: Birder Robson, MD;  Location: [place];  Service: Ophthalmology;  Laterality: Left;   CAUTERY OF TURBINATES     COLONOSCOPY WITH PROPOFOL N/A 07/19/2015   Procedure: COLONOSCOPY WITH PROPOFOL;  Surgeon: [name], MD;  Location: [facility] ENDOSCOPY;  Service: Endoscopy;  Laterality: N/A;   COLONOSCOPY WITH PROPOFOL N/A 03/21/2021   Procedure: COLONOSCOPY WITH PROPOFOL;  Surgeon: [name], MD;  Location[facility] ENDOSCOPY;  Service: Endoscopy;   Laterality: N/A;   COLONOSCOPY WITH PROPOFOL N/A 04/18/2021   Procedure: COLONOSCOPY WITH PROPOFOL;  Surgeon: [name], MD;  Location: [facility] ENDOSCOPY;  Service: Endoscopy;  Laterality: N/A;   EYE SURGERY     HERNIA REPAIR Left    inguinal herniorrhapy   KNEE SURGERY     ? loose body/ chondral defect 18yr   ROTATOR CUFF REPAIR     left   SPINE SURGERY     TONSILLECTOMY     TOTAL KNEE ARTHROPLASTY Left 06/22/2014   Procedure: TOTAL KNEE ARTHROPLASTY;  Surgeon: JJohnny Bridge MD;  [place]  Service: Orthopedics;  Laterality: Left;    Home Medications:  Allergies as of 04/23/2022   No Known Allergies      Medication List        Accurate as of April 23, 2022  2:49 PM. If you have any questions, ask your nurse or doctor.          anastrozole 1 MG tablet Commonly known as: ARIMIDEX Take 1 mg by mouth once a week.   Boron 6 MG Tabs Take by mouth daily.   clonazepam 0.125 MG disintegrating tablet Commonly known as: KLONOPIN Take 1 tablet (0.125 mg total) by mouth daily. In the morning as needed for anxiety   doxycycline 100 MG tablet Commonly known as: VIBRA-TABS Take 1 tablet (100 mg total) by mouth 2 (two) times daily.   multivitamin with minerals Tabs tablet Take 1 tablet by mouth 2 (two) times daily.  NON FORMULARY Sharp Thought Takes 2 capsule bid.   OVER THE COUNTER MEDICATION Take 250 mg by mouth daily. Bio Magnesium Citrate   OVER THE COUNTER MEDICATION Take 20,000 mcg by mouth daily. Vitamin B12-methylfolate   OVER THE COUNTER MEDICATION Take 800 mcg by mouth daily. Active B Folate   OVER THE COUNTER MEDICATION 2 tablets daily. VitaPrime   OVER THE COUNTER MEDICATION Take 1 capsule by mouth 3 (three) times daily. Instaflex   OVER THE COUNTER MEDICATION 1,000 mg daily. Super Bio-C buffered   OVER THE COUNTER MEDICATION 1,000 mg daily. L'Arginine   Saw Palmetto 160 MG Tabs daily in the afternoon.   sildenafil 50 MG  tablet Commonly known as: VIAGRA USE AS DIRECTED   tadalafil 20 MG tablet Commonly known as: CIALIS TAKE ONE TABLET DAILY AS NEEDED FOR ERECTILE DYSFUNCTION   tamsulosin 0.4 MG Caps capsule Commonly known as: FLOMAX TAKE 1 CAPSULE BY MOUTH ONCE DAILY   Testosterone 75 MG Pllt by Implant route.   thyroid 60 MG tablet Commonly known as: NP Thyroid Take 1 tablet (60 mg total) by mouth daily.   zolpidem 6.25 MG CR tablet Commonly known as: AMBIEN CR TAKE 1 TABLET BY MOUTH AT BEDTIME AS NEEDED FOR SLEEP        Allergies: No Known Allergies  Family History: Family History  Problem Relation Age of Onset   Alcohol abuse Mother    Diabetes Mother    Mental retardation Mother     Social History:  reports that he quit smoking about 38 years ago. His smoking use included cigarettes. He has a 15.00 pack-year smoking history. His smokeless tobacco use includes chew. He reports current alcohol use of about 10.0 standard drinks of alcohol per week. He reports that he does not use drugs.   Physical Exam: BP (!) 169/69   Pulse 90   Ht 6' (1.829 m)   Wt 165 lb (74.8 kg)   BMI 22.38 kg/m   Constitutional:  Alert and oriented, No acute distress. HEENT: [place] AT Respiratory: Normal respiratory effort, no increased work of breathing. Psychiatric: Normal mood and affect.   Assessment & Plan:    1.  BPH with LUTS Mild LUTS, stable on tamsulosin  2.  Erectile dysfunction Stable  3.  Hypogonadism Stable Administered/monitored in [place]  4.  Microhematuria UA today stay 3-10 RBC   Abbie Sons, MD  [place] [address] [place], [place] [address] [phone]

## 2022-04-24 ENCOUNTER — Ambulatory Visit: Payer: PPO | Admitting: Anesthesiology

## 2022-04-24 ENCOUNTER — Encounter: Payer: Self-pay | Admitting: Urology

## 2022-04-24 ENCOUNTER — Encounter: Payer: Self-pay | Admitting: Gastroenterology

## 2022-04-24 ENCOUNTER — Encounter
Admission: RE | Disposition: A | Discharge status: Home / Self Care | Payer: Self-pay | Source: Home / Self Care | Attending: Gastroenterology

## 2022-04-24 ENCOUNTER — Other Ambulatory Visit: Payer: Self-pay

## 2022-04-24 ENCOUNTER — Ambulatory Visit
Admission: RE | Admit: 2022-04-24 | Discharge: 2022-04-24 | Disposition: A | Discharge status: Home / Self Care | Payer: PPO | Attending: Gastroenterology | Admitting: Gastroenterology

## 2022-04-24 DIAGNOSIS — K5730 Diverticulosis of large intestine without perforation or abscess without bleeding: Secondary | ICD-10-CM | POA: Diagnosis not present

## 2022-04-24 DIAGNOSIS — Z86010 Personal history of colonic polyps: Secondary | ICD-10-CM | POA: Diagnosis not present

## 2022-04-24 DIAGNOSIS — E039 Hypothyroidism, unspecified: Secondary | ICD-10-CM | POA: Insufficient documentation

## 2022-04-24 DIAGNOSIS — K635 Polyp of colon: Secondary | ICD-10-CM

## 2022-04-24 DIAGNOSIS — K7460 Unspecified cirrhosis of liver: Secondary | ICD-10-CM | POA: Insufficient documentation

## 2022-04-24 DIAGNOSIS — K5140 Inflammatory polyps of colon without complications: Secondary | ICD-10-CM | POA: Insufficient documentation

## 2022-04-24 DIAGNOSIS — K641 Second degree hemorrhoids: Secondary | ICD-10-CM | POA: Insufficient documentation

## 2022-04-24 DIAGNOSIS — D122 Benign neoplasm of ascending colon: Secondary | ICD-10-CM | POA: Insufficient documentation

## 2022-04-24 DIAGNOSIS — Z09 Encounter for follow-up examination after completed treatment for conditions other than malignant neoplasm: Secondary | ICD-10-CM | POA: Diagnosis not present

## 2022-04-24 DIAGNOSIS — Z87891 Personal history of nicotine dependence: Secondary | ICD-10-CM | POA: Insufficient documentation

## 2022-04-24 DIAGNOSIS — I10 Essential (primary) hypertension: Secondary | ICD-10-CM | POA: Diagnosis not present

## 2022-04-24 HISTORY — PX: COLONOSCOPY WITH PROPOFOL: SHX5780

## 2022-04-24 LAB — URINALYSIS, COMPLETE
Bilirubin, UA: NEGATIVE
Glucose, UA: NEGATIVE
Leukocytes,UA: NEGATIVE
Nitrite, UA: NEGATIVE
Specific Gravity, UA: 1.025 (ref 1.005–1.030)
Urobilinogen, Ur: 0.2 mg/dL (ref 0.2–1.0)
pH, UA: 5 (ref 5.0–7.5)

## 2022-04-24 LAB — MICROSCOPIC EXAMINATION: Bacteria, UA: NONE SEEN

## 2022-04-24 SURGERY — COLONOSCOPY WITH PROPOFOL
Anesthesia: General

## 2022-04-24 MED ORDER — PROPOFOL 500 MG/50ML IV EMUL
INTRAVENOUS | Status: DC | PRN
  Administered 2022-04-24: 50 mg via INTRAVENOUS
  Administered 2022-04-24: 150 ug/kg/min via INTRAVENOUS

## 2022-04-24 MED ORDER — SODIUM CHLORIDE 0.9 % IV SOLN: INTRAVENOUS | Status: DC

## 2022-04-24 NOTE — Transfer of Care (Signed)
Immediate Anesthesia Transfer of Care Note  Patient: Tony Luna  Procedure(s) Performed: COLONOSCOPY WITH PROPOFOL  Patient Location: PACU  Anesthesia Type:General  Level of Consciousness: awake, alert , and oriented  Airway & Oxygen Therapy: Patient Spontanous Breathing and Patient connected to nasal cannula oxygen  Post-op Assessment: Report given to RN and Post -op Vital signs reviewed and stable  Post vital signs: Reviewed and stable  Last Vitals:  Vitals Value Taken Time  BP 144/130 04/24/22 1129  Temp    Pulse 94 04/24/22 1131  Resp 18 04/24/22 1131  SpO2 97 % 04/24/22 1131  Vitals shown include unvalidated device data.  Last Pain:  Vitals:   04/24/22 1129  TempSrc:   PainSc: 0-No pain         Complications: No notable events documented.

## 2022-04-24 NOTE — Anesthesia Postprocedure Evaluation (Signed)
Anesthesia Post Note  Patient: Tony Luna  Procedure(s) Performed: COLONOSCOPY WITH PROPOFOL  Patient location during evaluation: PACU Anesthesia Type: General Level of consciousness: awake and alert Pain management: pain level controlled Vital Signs Assessment: post-procedure vital signs reviewed and stable Respiratory status: spontaneous breathing, nonlabored ventilation and respiratory function stable Cardiovascular status: blood pressure returned to baseline and stable Postop Assessment: no apparent nausea or vomiting Anesthetic complications: no   No notable events documented.   Last Vitals:  Vitals:   04/24/22 1129 04/24/22 1142  BP: (!) 111/58   Pulse:    Resp:    Temp:  (!) 36 C  SpO2:  97%    Last Pain:  Vitals:   04/24/22 1142  TempSrc: Temporal  PainSc: 0-No pain                 [place] Ouch

## 2022-04-24 NOTE — Op Note (Signed)
[facility] Gastroenterology Patient Name: Tony Luna Procedure Date: 04/24/2022 11:02 AM MRN: [id number] Account #: [id number] Date of Birth: 06/25/1940 Admit Type: Outpatient Age: 81 Room: [facility] ENDO ROOM 4 Gender: Male Note Status: Finalized Instrument Name: Park Meo [id number] Procedure:             Colonoscopy Indications:           High risk colon cancer surveillance: Personal history                         of colonic polyps Providers:             [name] MD, MD Referring MD:          Deborra Medina, MD (Referring MD) Medicines:             Propofol per Anesthesia Complications:         No immediate complications. Procedure:             Pre-Anesthesia Assessment:                        - Prior to the procedure, a History and Physical was                         performed, and patient medications and allergies were                         reviewed. The patient's tolerance of previous                         anesthesia was also reviewed. The risks and benefits                         of the procedure and the sedation options and risks                         were discussed with the patient. All questions were                         answered, and informed consent was obtained. Prior                         Anticoagulants: The patient has taken no anticoagulant                         or antiplatelet agents. ASA Grade Assessment: II - A                         patient with mild systemic disease. After reviewing                         the risks and benefits, the patient was deemed in                         satisfactory condition to undergo the procedure.                        After obtaining informed consent, the colonoscope was  passed under direct vision. Throughout the procedure,                         the patient's blood pressure, pulse, and oxygen                         saturations were monitored continuously. The                          Colonoscope was introduced through the anus and                         advanced to the the cecum, identified by appendiceal                         orifice and ileocecal valve. The colonoscopy was                         performed without difficulty. The patient tolerated                         the procedure well. The quality of the bowel                         preparation was adequate to identify polyps. Findings:      The perianal and digital rectal examinations were normal.      Two sessile polyps were found in the ascending colon. The polyps were 1       to 4 mm in size. These polyps were removed with a cold snare. Resection       and retrieval were complete. To prevent bleeding post-intervention, one       hemostatic clip was successfully placed (MR conditional). Clip       manufacturer: [employer]. There was no bleeding at the end of the       procedure.      Multiple small-mouthed diverticula were found in the sigmoid colon.      Non-bleeding internal hemorrhoids were found during retroflexion. The       hemorrhoids were Grade II (internal hemorrhoids that prolapse but reduce       spontaneously). Impression:            - Two 1 to 4 mm polyps in the ascending colon, removed                         with a cold snare. Resected and retrieved. Clip (MR                         conditional) was placed. Clip manufacturer: [employer].                        - Diverticulosis in the sigmoid colon.                        - Non-bleeding internal hemorrhoids. Recommendation:        - Discharge patient to home.                        -  Resume previous diet.                        - Repeat colonoscopy is not recommended for                         surveillance. Procedure Code(s):     --- Professional ---                        [phone], Colonoscopy, flexible; with removal of                         tumor(s), polyp(s), or other lesion(s) by snare                          technique Diagnosis Code(s):     --- Professional ---                        Z86.010, Personal history of colonic polyps                        D12.2, Benign neoplasm of ascending colon CPT copyright 2022 American Medical Association. All rights reserved. The codes documented in this report are preliminary and upon coder review may  be revised to meet current compliance requirements. [name] MD, MD 04/24/2022 11:26:59 AM This report has been signed electronically. Number of Addenda: 0 Note Initiated On: 04/24/2022 11:02 AM Scope Withdrawal Time: 0 hours 7 minutes 4 seconds  Total Procedure Duration: 0 hours 11 minutes 31 seconds  Estimated Blood Loss:  Estimated blood loss: none.      [facility]

## 2022-04-24 NOTE — Anesthesia Preprocedure Evaluation (Addendum)
Anesthesia Evaluation  Patient identified by MRN, date of birth, ID band Patient awake    Reviewed: Allergy & Precautions, NPO status , Patient's Chart, lab work & pertinent test results  History of Anesthesia Complications Negative for: history of anesthetic complications  Airway Mallampati: III   Neck ROM: Full    Dental  (+) Poor Dentition   Pulmonary former smoker   Pulmonary exam normal breath sounds clear to auscultation       Cardiovascular Exercise Tolerance: Good hypertension, + dysrhythmias (Pt is unaware of reason this is on chart) + Valvular Problems/Murmurs  Rhythm:Regular Rate:Normal + Systolic murmurs ECG 80/1/65: normal   Neuro/Psych negative neurological ROS     GI/Hepatic negative GI ROS,,,(+) Cirrhosis         Endo/Other  Hypothyroidism    Renal/GU negative Renal ROS     Musculoskeletal  (+) Arthritis ,    Abdominal Normal abdominal exam  (+)   Peds  Hematology thrombocytopenia   Anesthesia Other Findings Allergy    Arthritis    Cirrhosis ([place])    Complication of anesthesia  :"need catheter"  Dysrhythmia  ? something told by reg dr unable to say what it was  Esophageal tear    Hypothyroidism    Pneumonia  hx  Primary osteoarthritis of left knee 06/22/2014   Rotator cuff tear       Reproductive/Obstetrics                             Anesthesia Physical Anesthesia Plan  ASA: 3  Anesthesia Plan: General   Post-op Pain Management:    Induction: Intravenous  PONV Risk Score and Plan: 2 and Propofol infusion and TIVA  Airway Management Planned: Natural Airway and Nasal Cannula  Additional Equipment:   Intra-op Plan:   Post-operative Plan:   Informed Consent: I have reviewed the patients History and Physical, chart, labs and discussed the procedure including the risks, benefits and alternatives for the proposed anesthesia with the patient or  authorized representative who has indicated his/her understanding and acceptance.     Dental advisory given  Plan Discussed with: CRNA, Anesthesiologist and Surgeon  Anesthesia Plan Comments:         Anesthesia Quick Evaluation

## 2022-04-24 NOTE — H&P (Signed)
Tony Lame, MD [address] [name], [place] [address] Phone:[phone] Fax : [phone]  Primary Care Physician:  [name], MD Primary Gastroenterologist:  Dr. Allen Norris  Pre-Procedure History & Physical: HPI:  Tony Luna is a 81 y.o. male is here for an colonoscopy.   Past Medical History:  Diagnosis Date   Allergy    Arthritis    Cirrhosis ([place])    Complication of anesthesia    :"need catheter"   Dysrhythmia    ? something told by reg dr  unable to say what it was   Esophageal tear    Hypothyroidism    Pneumonia    hx   Primary osteoarthritis of left knee 06/22/2014   Rotator cuff tear     Past Surgical History:  Procedure Laterality Date   BACK SURGERY     blepharaplasty     CATARACT EXTRACTION W/PHACO Left 02/21/2021   Procedure: CATARACT EXTRACTION PHACO AND INTRAOCULAR LENS PLACEMENT ([place]) LEFT 4.56 00:34.9;  Surgeon: Birder Robson, MD;  Location: [facility];  Service: Ophthalmology;  Laterality: Left;   CAUTERY OF TURBINATES     COLONOSCOPY WITH PROPOFOL N/A 07/19/2015   Procedure: COLONOSCOPY WITH PROPOFOL;  Surgeon: Tony Lame, MD;  Location: [facility] ENDOSCOPY;  Service: Endoscopy;  Laterality: N/A;   COLONOSCOPY WITH PROPOFOL N/A 03/21/2021   Procedure: COLONOSCOPY WITH PROPOFOL;  Surgeon: Tony Lame, MD;  Location: [facility]COPY;  Service: Endoscopy;  Laterality: N/A;   COLONOSCOPY WITH PROPOFOL N/A 04/18/2021   Procedure: COLONOSCOPY WITH PROPOFOL;  Surgeon: Tony Lame, MD;  Location: [facility] ENDOSCOPY;  Service: Endoscopy;  Laterality: N/A;   EYE SURGERY     HERNIA REPAIR Left    inguinal herniorrhapy   KNEE SURGERY     ? 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 Authorizing Provider  tamsulosin (FLOMAX) 0.4 MG CAPS capsule TAKE 1 CAPSULE BY MOUTH ONCE DAILY 01/25/22  Yes WDutch QuintB, FNP  thyroid (NP THYROID) 60 MG tablet Take 1 tablet (60 mg total) by mouth daily. 03/19/22  Yes TCrecencio Mc MD  anastrozole (ARIMIDEX) 1 MG tablet Take 1 mg by mouth once a week. 01/04/21   [provider]  Boron 6 MG TABS Take by mouth daily.    [provider]  clonazepam (KLONOPIN) 0.125 MG disintegrating tablet Take 1 tablet (0.125 mg total) by mouth daily. In the morning as needed for anxiety 02/02/22   TCrecencio Mc MD  doxycycline (VIBRA-TABS) 100 MG tablet Take 1 tablet (100 mg total) by mouth 2 (two) times daily. 07/31/21   TCrecencio Mc MD  Multiple Vitamin (MULTIVITAMIN WITH MINERALS) TABS tablet Take 1 tablet by mouth 2 (two) times daily.    [provider]  NON FLanette HampshireThought Takes 2 capsule bid.    [provider]  OVER THE COUNTER MEDICATION Take 1 capsule by mouth 3 (three) times daily. Instaflex    [provider]  OVER THE COUNTER MEDICATION Take 250 mg by mouth daily. Bio Magnesium Citrate    [provider]  OVER THE COUNTER MEDICATION Take 20,000 mcg  by mouth daily. Vitamin B12-methylfolate    [provider]  OVER THE COUNTER MEDICATION Take 800 mcg by mouth daily. Active B Folate    [provider]  OVER THE COUNTER MEDICATION 2 tablets daily. VitaPrime    [provider]  OVER THE COUNTER MEDICATION 1,000 mg daily. Super Bio-C buffered    [provider]  OVER THE COUNTER MEDICATION 1,000 mg daily. L'Arginine    [provider]  Saw Palmetto 160 MG TABS daily in the afternoon. 05/30/19   [provider]  sildenafil (VIAGRA) 50 MG tablet USE AS DIRECTED 01/12/22   [name], MD  tadalafil (CIALIS) 20 MG tablet TAKE ONE TABLET DAILY AS NEEDED FOR ERECTILE  DYSFUNCTION 02/23/21   [name], MD  Testosterone 75 MG PLLT by Implant route.    [provider]  zolpidem (AMBIEN CR) 6.25 MG CR tablet TAKE 1 TABLET BY MOUTH AT BEDTIME AS NEEDED FOR SLEEP 01/30/22   Dutch Quint B, FNP    Allergies as of 02/16/2022   (No Known Allergies)    Family History  Problem Relation Age of Onset   Alcohol abuse Mother    Diabetes Mother    Mental retardation Mother     Social History   Socioeconomic History   Marital status: Married    Spouse name: Not on file   Number of children: 2   Years of education: Not on file   Highest education level: Not on file  Occupational History   Occupation: textiles  Tobacco Use   Smoking status: Former    Packs/day: 1.00    Years: 15.00    Total pack years: 15.00    Types: Cigarettes    Quit date: 06/15/1983    Years since quitting: 38.8   Smokeless tobacco: Current    Types: Chew  Vaping Use   Vaping Use: Never used  Substance and Sexual Activity   Alcohol use: Yes    Alcohol/week: 10.0 standard drinks of alcohol    Types: 10 Glasses of wine per week   Drug use: No   Sexual activity: Not Currently  Other Topics Concern   Not on file  Social History Narrative   [employer] graduate   [place]   Very active   Walks 1 hour daily   Social Determinants of Health   Financial Resource Strain: Low Risk  (06/19/2021)   Overall Financial Resource Strain (CARDIA)    Difficulty of Paying Living Expenses: Not hard at all  Food Insecurity: No Food Insecurity (06/19/2021)   Hunger Vital Sign    Worried About Running Out of Food in the Last Year: Never true    [place] in the Last Year: Never true  Transportation Needs: No Transportation Needs (06/19/2021)   PRAPARE - Hydrologist (Medical): No    Lack of Transportation (Non-Medical): No  Physical Activity: Sufficiently Active (06/19/2021)   Exercise Vital Sign    Days of Exercise per Week: 7 days    Minutes of  Exercise per Session: 60 min  Stress: No Stress Concern Present (06/19/2021)   [place]    Feeling of Stress : Not at all  Social Connections: [place] (06/19/2021)   Social Connection and Isolation Panel [NHANES]    Frequency of Communication with Friends and Family: More than three times a week    Frequency of Social Gatherings with Friends and Family:  More than three times a week    Attends Religious Services: 1 to 4 times per year    Active Member of Clubs or Organizations: Yes    Attends Archivist Meetings: 1 to 4 times per year    Marital Status: Married  Human resources officer Violence: Not At Risk (06/19/2021)   Humiliation, Afraid, Rape, and Kick questionnaire    Fear of Current or Ex-Partner: No    Emotionally Abused: No    Physically Abused: No    Sexually Abused: No    Review of Systems: See HPI, otherwise negative ROS  Physical Exam: BP (!) 147/78   Pulse 96   Temp (!) 96.9 F (36.1 C) (Temporal)   Resp 16   Ht 6' (1.829 m)   Wt 74.8 kg   SpO2 98%   BMI 22.38 kg/m  General:   Alert,  pleasant and cooperative in NAD Head:  Normocephalic and atraumatic. Neck:  Supple; no masses or thyromegaly. Lungs:  Clear throughout to auscultation.    Heart:  Regular rate and rhythm. Abdomen:  Soft, nontender and nondistended. Normal bowel sounds, without guarding, and without rebound.   Neurologic:  Alert and  oriented x4;  grossly normal neurologically.  Impression/Plan: MARLEN MOLLICA is here for an colonoscopy to be performed for a history of adenomatous polyps on 2022   Risks, benefits, limitations, and alternatives regarding  colonoscopy have been reviewed with the patient.  Questions have been answered.  All parties agreeable.   Tony Lame, MD  04/24/2022, 10:31 AM

## 2022-04-25 ENCOUNTER — Encounter: Payer: Self-pay | Admitting: Urology

## 2022-04-25 LAB — SURGICAL PATHOLOGY

## 2022-04-26 ENCOUNTER — Other Ambulatory Visit: Payer: Self-pay | Admitting: Family

## 2022-04-26 ENCOUNTER — Encounter: Payer: Self-pay | Admitting: Gastroenterology

## 2022-04-26 DIAGNOSIS — D2261 Melanocytic nevi of right upper limb, including shoulder: Secondary | ICD-10-CM | POA: Diagnosis not present

## 2022-04-26 DIAGNOSIS — L570 Actinic keratosis: Secondary | ICD-10-CM | POA: Diagnosis not present

## 2022-04-26 DIAGNOSIS — D2271 Melanocytic nevi of right lower limb, including hip: Secondary | ICD-10-CM | POA: Diagnosis not present

## 2022-04-26 DIAGNOSIS — L728 Other follicular cysts of the skin and subcutaneous tissue: Secondary | ICD-10-CM | POA: Diagnosis not present

## 2022-04-26 DIAGNOSIS — D2262 Melanocytic nevi of left upper limb, including shoulder: Secondary | ICD-10-CM | POA: Diagnosis not present

## 2022-04-26 DIAGNOSIS — L821 Other seborrheic keratosis: Secondary | ICD-10-CM | POA: Diagnosis not present

## 2022-04-26 DIAGNOSIS — D225 Melanocytic nevi of trunk: Secondary | ICD-10-CM | POA: Diagnosis not present

## 2022-04-26 DIAGNOSIS — D2272 Melanocytic nevi of left lower limb, including hip: Secondary | ICD-10-CM | POA: Diagnosis not present

## 2022-05-03 ENCOUNTER — Other Ambulatory Visit: Payer: Self-pay | Admitting: Internal Medicine

## 2022-05-03 ENCOUNTER — Telehealth: Payer: Self-pay

## 2022-05-03 MED ORDER — AZITHROMYCIN 500 MG PO TABS: 500.0000 mg | ORAL_TABLET | Freq: Every day | ORAL | 0 refills | Status: DC

## 2022-05-03 MED ORDER — PREDNISONE 10 MG PO TABS: ORAL_TABLET | ORAL | 0 refills | Status: DC

## 2022-05-03 MED ORDER — BENZONATATE 200 MG PO CAPS: 200.0000 mg | ORAL_CAPSULE | Freq: Three times a day (TID) | ORAL | 1 refills | Status: DC | PRN

## 2022-05-03 MED ORDER — ZOLPIDEM TARTRATE ER 6.25 MG PO TBCR: 6.2500 mg | EXTENDED_RELEASE_TABLET | Freq: Every evening | ORAL | 0 refills | Status: DC | PRN

## 2022-05-03 NOTE — Telephone Encounter (Signed)
Lm letting pt know medication has been sent to pharmacy

## 2022-05-03 NOTE — Telephone Encounter (Signed)
Patient states he is returning our call.  I read message from Adair Laundry, [place], to patient.

## 2022-05-03 NOTE — Telephone Encounter (Signed)
Patient states he needs a refill for his zolpidem (AMBIEN CR) 6.25 MG CR tablet.  Patient states he is leaving town tomorrow morning and would like to pick it up before he leaves.  Patient states [facility] has reached out to [place], but has not heard back.

## 2022-05-03 NOTE — Telephone Encounter (Signed)
noted 

## 2022-05-08 NOTE — Telephone Encounter (Signed)
Error

## 2022-05-08 NOTE — Telephone Encounter (Signed)
MyChart messgae sent to patient. 

## 2022-06-09 ENCOUNTER — Other Ambulatory Visit: Payer: Self-pay | Admitting: Internal Medicine

## 2022-06-09 DIAGNOSIS — N529 Male erectile dysfunction, unspecified: Secondary | ICD-10-CM

## 2022-06-12 ENCOUNTER — Other Ambulatory Visit: Payer: Self-pay | Admitting: Internal Medicine

## 2022-06-12 DIAGNOSIS — G4733 Obstructive sleep apnea (adult) (pediatric): Secondary | ICD-10-CM | POA: Diagnosis not present

## 2022-06-12 DIAGNOSIS — H109 Unspecified conjunctivitis: Secondary | ICD-10-CM | POA: Diagnosis not present

## 2022-06-13 ENCOUNTER — Other Ambulatory Visit: Payer: Self-pay

## 2022-07-02 ENCOUNTER — Ambulatory Visit (INDEPENDENT_AMBULATORY_CARE_PROVIDER_SITE_OTHER): Payer: PPO

## 2022-07-02 ENCOUNTER — Telehealth: Payer: Self-pay

## 2022-07-02 VITALS — BP 136/76 | HR 92 | Temp 97.1°F | Resp 15 | Ht 72.0 in | Wt 177.0 lb

## 2022-07-02 DIAGNOSIS — R7301 Impaired fasting glucose: Secondary | ICD-10-CM | POA: Diagnosis not present

## 2022-07-02 DIAGNOSIS — R030 Elevated blood-pressure reading, without diagnosis of hypertension: Secondary | ICD-10-CM

## 2022-07-02 DIAGNOSIS — R748 Abnormal levels of other serum enzymes: Secondary | ICD-10-CM

## 2022-07-02 DIAGNOSIS — E785 Hyperlipidemia, unspecified: Secondary | ICD-10-CM | POA: Diagnosis not present

## 2022-07-02 DIAGNOSIS — Z0000 Encounter for general adult medical examination without abnormal findings: Secondary | ICD-10-CM | POA: Diagnosis not present

## 2022-07-02 DIAGNOSIS — R5383 Other fatigue: Secondary | ICD-10-CM

## 2022-07-02 DIAGNOSIS — E291 Testicular hypofunction: Secondary | ICD-10-CM

## 2022-07-02 DIAGNOSIS — Z125 Encounter for screening for malignant neoplasm of prostate: Secondary | ICD-10-CM | POA: Diagnosis not present

## 2022-07-02 LAB — BASIC METABOLIC PANEL
BUN: 14 mg/dL (ref 6–23)
CO2: 28 mEq/L (ref 19–32)
Calcium: 9.3 mg/dL (ref 8.4–10.5)
Chloride: 102 mEq/L (ref 96–112)
Creatinine, Ser: 1.19 mg/dL (ref 0.40–1.50)
GFR: 58.02 mL/min — ABNORMAL LOW (ref >60.00)
Glucose, Bld: 107 mg/dL — ABNORMAL HIGH (ref 70–99)
Potassium: 4 mEq/L (ref 3.5–5.1)
Sodium: 142 mEq/L (ref 135–145)

## 2022-07-02 LAB — HEPATIC FUNCTION PANEL
ALT: 27 U/L (ref 0–53)
AST: 35 U/L (ref 0–37)
Albumin: 4.4 g/dL (ref 3.5–5.2)
Alkaline Phosphatase: 86 U/L (ref 39–117)
Bilirubin, Direct: 0.3 mg/dL (ref 0.0–0.3)
Total Bilirubin: 1 mg/dL (ref 0.2–1.2)
Total Protein: 7 g/dL (ref 6.0–8.3)

## 2022-07-02 LAB — CBC WITH DIFFERENTIAL/PLATELET
Basophils Absolute: 0 10*3/uL (ref 0.0–0.1)
Basophils Relative: 0.9 % (ref 0.0–3.0)
Eosinophils Absolute: 0.1 10*3/uL (ref 0.0–0.7)
Eosinophils Relative: 1.3 % (ref 0.0–5.0)
HCT: 41.9 % (ref 39.0–52.0)
Hemoglobin: 14.7 g/dL (ref 13.0–17.0)
Lymphocytes Relative: 13.1 % (ref 12.0–46.0)
Lymphs Abs: 0.6 10*3/uL — ABNORMAL LOW (ref 0.7–4.0)
MCHC: 35.1 g/dL (ref 30.0–36.0)
MCV: 98.6 fl (ref 78.0–100.0)
Monocytes Absolute: 0.3 10*3/uL (ref 0.1–1.0)
Monocytes Relative: 7.2 % (ref 3.0–12.0)
Neutro Abs: 3.5 10*3/uL (ref 1.4–7.7)
Neutrophils Relative %: 77.5 % — ABNORMAL HIGH (ref 43.0–77.0)
Platelets: 147 10*3/uL — ABNORMAL LOW (ref 150.0–400.0)
RBC: 4.25 Mil/uL (ref 4.22–5.81)
RDW: 12 % (ref 11.5–15.5)
WBC: 4.5 10*3/uL (ref 4.0–10.5)

## 2022-07-02 LAB — LIPID PANEL
Cholesterol: 218 mg/dL — ABNORMAL HIGH (ref 0–200)
HDL: 117.2 mg/dL (ref >39.00)
LDL Cholesterol: 92 mg/dL (ref 0–99)
NonHDL: 100.93
Total CHOL/HDL Ratio: 2
Triglycerides: 47 mg/dL (ref 0.0–149.0)
VLDL: 9.4 mg/dL (ref 0.0–40.0)

## 2022-07-02 LAB — LDL CHOLESTEROL, DIRECT: Direct LDL: 75 mg/dL

## 2022-07-02 LAB — TSH: TSH: 2.54 u[IU]/mL (ref 0.35–5.50)

## 2022-07-02 LAB — MICROALBUMIN / CREATININE URINE RATIO
Creatinine,U: 89.8 mg/dL
Microalb Creat Ratio: 6.4 mg/g (ref 0.0–30.0)
Microalb, Ur: 5.8 mg/dL — ABNORMAL HIGH (ref 0.0–1.9)

## 2022-07-02 LAB — PSA, MEDICARE: PSA: 1.65 ng/ml (ref 0.10–4.00)

## 2022-07-02 LAB — HEMOGLOBIN A1C: Hgb A1c MFr Bld: 4.9 % (ref 4.6–6.5)

## 2022-07-02 LAB — TESTOSTERONE: Testosterone: 820.67 ng/dL (ref 300.00–890.00)

## 2022-07-02 NOTE — Telephone Encounter (Signed)
Labs ordered per the okay of Dr. Derrel Nip.

## 2022-07-02 NOTE — Patient Instructions (Addendum)
Tony Luna , Thank you for taking time to come for your Medicare Wellness Visit. I appreciate your ongoing commitment to your health goals. Please review the following plan we discussed and let me know if I can assist you in the future.   These are the goals we discussed:  Goals      Exercise 150 minutes per week (moderate activity)        This is a list of the screening recommended for you and due dates:  Health Maintenance  Topic Date Due   COVID-19 Vaccine (4 - 2023-24 season) 07/18/2022*   Colon Cancer Screening  04/25/2023   Medicare Annual Wellness Visit  07/03/2023   DTaP/Tdap/Td vaccine (4 - Td or Tdap) 06/07/2027   Pneumonia Vaccine  Completed   Flu Shot  Completed   Hepatitis C Screening: USPSTF Recommendation to screen - Ages 80-79 yo.  Completed   HPV Vaccine  Aged Out   Zoster (Shingles) Vaccine  Discontinued  *Topic was postponed. The date shown is not the original due date.    Advanced directives: on file  Labs completed in office today per patient request   Conditions/risks identified: none new  Next appointment: Follow up in one year for your annual wellness visit.   Preventive Care 80 Years and Older, Male  Preventive care refers to lifestyle choices and visits with your health care provider that can promote health and wellness. What does preventive care include? A yearly physical exam. This is also called an annual well check. Dental exams once or twice a year. Routine eye exams. Ask your health care provider how often you should have your eyes checked. Personal lifestyle choices, including: Daily care of your teeth and gums. Regular physical activity. Eating a healthy diet. Avoiding tobacco and drug use. Limiting alcohol use. Practicing safe sex. Taking low doses of aspirin every day. Taking vitamin and mineral supplements as recommended by your health care provider. What happens during an annual well check? The services and screenings done by your  health care provider during your annual well check will depend on your age, overall health, lifestyle risk factors, and family history of disease. Counseling  Your health care provider may ask you questions about your: Alcohol use. Tobacco use. Drug use. Emotional well-being. Home and relationship well-being. Sexual activity. Eating habits. History of falls. Memory and ability to understand (cognition). Work and work Statistician. Screening  You may have the following tests or measurements: Height, weight, and BMI. Blood pressure. Lipid and cholesterol levels. These may be checked every 5 years, or more frequently if you are over 29 years old. Skin check. Lung cancer screening. You may have this screening every year starting at age 80 if you have a 30-pack-year history of smoking and currently smoke or have quit within the past 15 years. Fecal occult blood test (FOBT) of the stool. You may have this test every year starting at age 80. Flexible sigmoidoscopy or colonoscopy. You may have a sigmoidoscopy every 5 years or a colonoscopy every 10 years starting at age 61. Prostate cancer screening. Recommendations will vary depending on your family history and other risks. Hepatitis C blood test. Hepatitis B blood test. Sexually transmitted disease (STD) testing. Diabetes screening. This is done by checking your blood sugar (glucose) after you have not eaten for a while (fasting). You may have this done every 1-3 years. Abdominal aortic aneurysm (AAA) screening. You may need this if you are a current or former smoker. Osteoporosis. You may be  screened starting at age 80 if you are at high risk. Talk with your health care provider about your test results, treatment options, and if necessary, the need for more tests. Vaccines  Your health care provider may recommend certain vaccines, such as: Influenza vaccine. This is recommended every year. Tetanus, diphtheria, and acellular pertussis  (Tdap, Td) vaccine. You may need a Td booster every 10 years. Zoster vaccine. You may need this after age 32. Pneumococcal 13-valent conjugate (PCV13) vaccine. One dose is recommended after age 75. Pneumococcal polysaccharide (PPSV23) vaccine. One dose is recommended after age 45. Talk to your health care provider about which screenings and vaccines you need and how often you need them. This information is not intended to replace advice given to you by your health care provider. Make sure you discuss any questions you have with your health care provider. Document Released: 06/10/2015 Document Revised: 02/01/2016 Document Reviewed: 03/15/2015 Elsevier Interactive Patient Education  2017 [place] Prevention in the Home Falls can cause injuries. They can happen to people of all ages. There are many things you can do to make your home safe and to help prevent falls. What can I do on the outside of my home? Regularly fix the edges of walkways and driveways and fix any cracks. Remove anything that might make you trip as you walk through a door, such as a raised step or threshold. Trim any bushes or trees on the path to your home. Use bright outdoor lighting. Clear any walking paths of anything that might make someone trip, such as rocks or tools. Regularly check to see if handrails are loose or broken. Make sure that both sides of any steps have handrails. Any raised decks and porches should have guardrails on the edges. Have any leaves, snow, or ice cleared regularly. Use sand or salt on walking paths during winter. Clean up any spills in your garage right away. This includes oil or grease spills. What can I do in the bathroom? Use night lights. Install grab bars by the toilet and in the tub and shower. Do not use towel bars as grab bars. Use non-skid mats or decals in the tub or shower. If you need to sit down in the shower, use a plastic, non-slip stool. Keep the floor dry. Clean  up any water that spills on the floor as soon as it happens. Remove soap buildup in the tub or shower regularly. Attach bath mats securely with double-sided non-slip rug tape. Do not have throw rugs and other things on the floor that can make you trip. What can I do in the bedroom? Use night lights. Make sure that you have a light by your bed that is easy to reach. Do not use any sheets or blankets that are too big for your bed. They should not hang down onto the floor. Have a firm chair that has side arms. You can use this for support while you get dressed. Do not have throw rugs and other things on the floor that can make you trip. What can I do in the kitchen? Clean up any spills right away. Avoid walking on wet floors. Keep items that you use a lot in easy-to-reach places. If you need to reach something above you, use a strong step stool that has a grab bar. Keep electrical cords out of the way. Do not use floor polish or wax that makes floors slippery. If you must use wax, use non-skid floor wax. Do not have  throw rugs and other things on the floor that can make you trip. What can I do with my stairs? Do not leave any items on the stairs. Make sure that there are handrails on both sides of the stairs and use them. Fix handrails that are broken or loose. Make sure that handrails are as long as the stairways. Check any carpeting to make sure that it is firmly attached to the stairs. Fix any carpet that is loose or worn. Avoid having throw rugs at the top or bottom of the stairs. If you do have throw rugs, attach them to the floor with carpet tape. Make sure that you have a light switch at the top of the stairs and the bottom of the stairs. If you do not have them, ask someone to add them for you. What else can I do to help prevent falls? Wear shoes that: Do not have high heels. Have rubber bottoms. Are comfortable and fit you well. Are closed at the toe. Do not wear sandals. If you  use a stepladder: Make sure that it is fully opened. Do not climb a closed stepladder. Make sure that both sides of the stepladder are locked into place. Ask someone to hold it for you, if possible. Clearly mark and make sure that you can see: Any grab bars or handrails. First and last steps. Where the edge of each step is. Use tools that help you move around (mobility aids) if they are needed. These include: Canes. Walkers. Scooters. Crutches. Turn on the lights when you go into a dark area. Replace any light bulbs as soon as they burn out. Set up your furniture so you have a clear path. Avoid moving your furniture around. If any of your floors are uneven, fix them. If there are any pets around you, be aware of where they are. Review your medicines with your doctor. Some medicines can make you feel dizzy. This can increase your chance of falling. Ask your doctor what other things that you can do to help prevent falls. This information is not intended to replace advice given to you by your health care provider. Make sure you discuss any questions you have with your health care provider. Document Released: 03/10/2009 Document Revised: 10/20/2015 Document Reviewed: 06/18/2014 Elsevier Interactive Patient Education  2017 [employer].

## 2022-07-02 NOTE — Progress Notes (Addendum)
Subjective:   Tony Luna is a 82 y.o. male who presents for Medicare Annual/Subsequent preventive examination.  Review of Systems    No ROS.  Medicare Wellness   Cardiac Risk Factors include: advanced age (>41mn, >>65women);male gender     Objective:    Today's Vitals   07/02/22 1105  BP: 136/76  Pulse: 92  Resp: 15  Temp: (!) 97.1 F (36.2 C)  SpO2: 99%  Weight: 177 lb (80.3 kg)  Height: 6' (1.829 m)   Body mass index is 24.01 kg/m.     07/02/2022   11:10 AM 04/24/2022    9:47 AM 04/12/2022    3:38 PM 02/07/2022   11:10 AM 06/19/2021    2:13 PM 04/18/2021    8:39 AM 03/21/2021    9:33 AM  Advanced Directives  Does Patient Have a Medical Advance Directive? Yes Yes Yes Yes Yes Yes Yes  Type of AParamed[place]of [place] will Living will Living will;Healthcare Power of ATibesLiving will HElizabethtown[place] will [place] will Living will  Does patient want to make changes to medical advance directive? No - Patient declined  Yes (ED - Information included in AVS)  No - Patient declined    Copy of [place] Chart? Yes - validated most recent copy scanned in chart (See row information)    Yes - validated most recent copy scanned in chart (See row information)      Current Medications (verified) Outpatient Encounter Medications as of 07/02/2022  Medication Sig   anastrozole (ARIMIDEX) 1 MG tablet Take 1 mg by mouth once a week.   azithromycin (ZITHROMAX) 500 MG tablet Take 1 tablet (500 mg total) by mouth daily.   benzonatate (TESSALON) 200 MG capsule Take 1 capsule (200 mg total) by mouth 3 (three) times daily as needed for cough.   Boron 6 MG TABS Take by mouth daily.   clonazepam (KLONOPIN) 0.125 MG disintegrating tablet Take 1 tablet (0.125 mg total) by mouth daily. In the morning as needed for anxiety   doxycycline (VIBRA-TABS) 100 MG tablet Take  1 tablet (100 mg total) by mouth 2 (two) times daily.   Multiple Vitamin (MULTIVITAMIN WITH MINERALS) TABS tablet Take 1 tablet by mouth 2 (two) times daily.   NON FORMULARY Sharp Thought Takes 2 capsule bid.   NP THYROID 60 MG tablet TAKE ONE TABLET BY MOUTH EVERY DAY   OVER THE COUNTER MEDICATION Take 1 capsule by mouth 3 (three) times daily. Instaflex   OVER THE COUNTER MEDICATION Take 250 mg by mouth daily. Bio Magnesium Citrate   OVER THE COUNTER MEDICATION Take 20,000 mcg by mouth daily. Vitamin B12-methylfolate   OVER THE COUNTER MEDICATION Take 800 mcg by mouth daily. Active B Folate   OVER THE COUNTER MEDICATION 2 tablets daily. VitaPrime   OVER THE COUNTER MEDICATION 1,000 mg daily. Super Bio-C buffered   OVER THE COUNTER MEDICATION 1,000 mg daily. L'Arginine   predniSONE (DELTASONE) 10 MG tablet 6 tablets daily for 3 days, then reduce by 1 tablet daily until gone   Saw Palmetto 160 MG TABS daily in the afternoon.   sildenafil (VIAGRA) 50 MG tablet USE AS DIRECTED   tadalafil (CIALIS) 20 MG tablet TAKE ONE TABLET DAILY AS NEEDED FOR ERECTILE DYSFUNCTION   tamsulosin (FLOMAX) 0.4 MG CAPS capsule TAKE 1 CAPSULE BY MOUTH ONCE DAILY   Testosterone 75 MG PLLT by Implant route.  zolpidem (AMBIEN CR) 6.25 MG CR tablet Take 1 tablet (6.25 mg total) by mouth at bedtime as needed. for sleep   No facility-administered encounter medications on file as of 07/02/2022.    Allergies (verified) Patient has no known allergies.   History: Past Medical History:  Diagnosis Date   Allergy    Arthritis    Cirrhosis ([place])    Complication of anesthesia    :"need catheter"   Dysrhythmia    ? something told by reg dr  unable to say what it was   Esophageal tear    Hypothyroidism    Pneumonia    hx   Primary osteoarthritis of left knee 06/22/2014   Rotator cuff tear    Past Surgical History:  Procedure Laterality Date   BACK SURGERY     blepharaplasty     CATARACT EXTRACTION W/PHACO Left  02/21/2021   Procedure: CATARACT EXTRACTION PHACO AND INTRAOCULAR LENS PLACEMENT ([place]) LEFT 4.56 00:34.9;  Surgeon: Birder Robson, MD;  Location: [place];  Service: Ophthalmology;  Laterality: Left;   CAUTERY OF TURBINATES     COLONOSCOPY WITH PROPOFOL N/A 07/19/2015   Procedure: COLONOSCOPY WITH PROPOFOL;  Surgeon: [name], MD;  Location: [facility] ENDOSCOPY;  Service: Endoscopy;  Laterality: N/A;   COLONOSCOPY WITH PROPOFOL N/A 03/21/2021   Procedure: COLONOSCOPY WITH PROPOFOL;  Surgeon: [name], MD;  Location[facility] ENDOSCOPY;  Service: Endoscopy;  Laterality: N/A;   COLONOSCOPY WITH PROPOFOL N/A 04/18/2021   Procedure: COLONOSCOPY WITH PROPOFOL;  Surgeon: [name], MD;  Location: [facility] ENDOSCOPY;  Service: Endoscopy;  Laterality: N/A;   COLONOSCOPY WITH PROPOFOL N/A 04/24/2022   Procedure: COLONOSCOPY WITH PROPOFOL;  Surgeon: [name], MD;  Location: [facility] ENDOSCOPY;  Service: Endoscopy;  Laterality: N/A;   EYE SURGERY     HERNIA REPAIR Left    inguinal herniorrhapy   KNEE SURGERY     ? 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   Procedure: TOTAL KNEE ARTHROPLASTY;  Surgeon: JJohnny Bridge MD;  [place]  Service: Orthopedics;  Laterality: Left;   Family History  Problem Relation Age of Onset   Alcohol abuse Mother    Diabetes Mother    Mental retardation Mother    Social History   Socioeconomic History   Marital status: Married    Spouse name: Not on file   Number of children: 2   Years of education: Not on file   Highest education level: Not on file  Occupational History   Occupation: textiles  Tobacco Use   Smoking status: Former    Packs/day: 1.00    Years: 15.00    Total pack years: 15.00    Types: Cigarettes    Quit date: 06/15/1983    Years since quitting: 39.0   Smokeless tobacco: Current    Types: Chew  Vaping Use   Vaping  Use: Never used  Substance and Sexual Activity   Alcohol use: Yes    Alcohol/week: 10.0 standard drinks of alcohol    Types: 10 Glasses of wine per week   Drug use: No   Sexual activity: Not Currently  Other Topics Concern   Not on file  Social History Narrative   [employer] graduate   Avid hunter   Very active   Walks 1 hour daily   Social Determinants of Health   Financial  Resource Strain: Low Risk  (07/02/2022)   Overall Financial Resource Strain (CARDIA)    Difficulty of Paying Living Expenses: Not hard at all  Food Insecurity: No Food Insecurity (07/02/2022)   Hunger Vital Sign    Worried About Running Out of Food in the Last Year: Never true    Ran Out of Food in the Last Year: Never true  Transportation Needs: No Transportation Needs (07/02/2022)   PRAPARE - Hydrologist (Medical): No    Lack of Transportation (Non-Medical): No  Physical Activity: Sufficiently Active (07/02/2022)   Exercise Vital Sign    Days of Exercise per Week: 4 days    Minutes of Exercise per Session: 60 min  Stress: No Stress Concern Present (07/02/2022)   [place]    Feeling of Stress : Not at all  Social Connections: Unknown (07/02/2022)   Social Connection and Isolation Panel [NHANES]    Frequency of Communication with Friends and Family: More than three times a week    Frequency of Social Gatherings with Friends and Family: Twice a week    Attends Religious Services: Not on Advertising copywriter or Organizations: Yes    Attends Music therapist: More than 4 times per year    Marital Status: Married    Tobacco Counseling Ready to quit: Not Answered Counseling given: Not Answered   Clinical Intake:  Pre-visit preparation completed: Yes        Diabetes: No  How often do you need to have someone help you when you read instructions, pamphlets, or other written materials from your  doctor or pharmacy?: 1 - Never    Interpreter Needed?: No      Activities of Daily Living    07/02/2022   11:03 AM 06/28/2022    4:55 PM  In your present state of health, do you have any difficulty performing the following activities:  Hearing? 1 0  Comment Hearing aids   Vision? 0 0  Difficulty concentrating or making decisions? 0 0  Walking or climbing stairs? 0 0  Dressing or bathing? 0 0  Doing errands, shopping? 0 0  Preparing Food and eating ? N N  Using the Toilet? N N  In the past six months, have you accidently leaked urine? N N  Do you have problems with loss of bowel control? N N  Managing your Medications? N N  Managing your Finances? N N  Housekeeping or managing your Housekeeping? N N    Patient Care Team: [name], MD as PCP - General (Internal Medicine) Kate Sable, MD as PCP - Cardiology (Cardiology)  Indicate any recent Medical Services you may have received from other than [facility] providers in the past year (date may be approximate).     Assessment:   This is a routine wellness examination for [place].  Hearing/Vision screen Hearing Screening - Comments:: Followed by [facility]  Visits every 6 months  Hearing aid, bilateral Vision Screening - Comments:: Followed by [facility]  Semi-annual visits Cataract extracted, L eye Wears glasses for reading  Dietary issues and exercise activities discussed: Current Exercise Habits: Home exercise routine, Type of exercise: strength training/weights, Time (Minutes): 60, Frequency (Times/Week): 3, Weekly Exercise (Minutes/Week): 180, Intensity: Mild Regular diet   Goals Addressed             This Visit's Progress    Exercise 150 minutes per week (  moderate activity)   On track      Depression Screen    07/02/2022   11:04 AM 02/02/2022    9:01 AM 07/31/2021    8:40 AM 06/19/2021    2:12 PM 01/31/2021    3:13 PM 06/16/2020    2:13 PM 06/15/2019   11:50 AM  PHQ 2/9 Scores  PHQ - 2  Score 0 0 0 0 0 0 0  PHQ- 9 Score  0         Fall Risk    07/02/2022   11:04 AM 06/28/2022    4:55 PM 02/02/2022    9:01 AM 07/31/2021    8:40 AM 06/19/2021    2:13 PM  Fall Risk   Falls in the past year? 0 0 0 0 0  Number falls in past yr:  0   0  Injury with Fall?  0     Risk for fall due to :    No Fall Risks   Follow up Falls evaluation completed;Falls prevention discussed  Falls evaluation completed Falls evaluation completed Falls evaluation completed    FALL RISK PREVENTION PERTAINING TO THE HOME: Home free of loose throw rugs in walkways, pet beds, electrical cords, etc? Yes  Adequate lighting in your home to reduce risk of falls? Yes   ASSISTIVE DEVICES UTILIZED TO PREVENT FALLS: Life alert? No  Use of a cane, walker or w/c? No  Grab bars in the bathroom? No  Shower chair or bench in shower? No  Elevated toilet seat or a handicapped toilet? No   TIMED UP AND GO: Was the test performed? Yes .  Length of time to ambulate 10 feet: 10 sec.   Gait steady and fast without use of assistive device  Cognitive Function:    06/15/2019   12:00 PM  MMSE - Mini Mental State Exam  Not completed: Unable to complete        07/02/2022   11:14 AM 06/09/2018    8:25 AM 06/06/2017    8:33 AM 05/29/2016   11:45 AM  6CIT Screen  What Year? 0 points 0 points 0 points 0 points  What month? 0 points 0 points 0 points 0 points  What time? 0 points 0 points 0 points 0 points  Count back from 20 0 points 0 points 0 points 0 points  Months in reverse 0 points 0 points 0 points 0 points  Repeat phrase 0 points 0 points 0 points 0 points  Total Score 0 points 0 points 0 points 0 points    Immunizations Immunization History  Administered Date(s) Administered   Fluad Quad(high Dose 65+) 04/27/2019, 03/31/2020, 01/31/2021   Hep A / Hep B 12/13/2015, 01/17/2016, 06/26/2016   Influenza Whole 02/26/2008   Influenza, High Dose Seasonal PF 04/23/2018, 02/02/2022   Influenza,inj,Quad PF,6+ Mos  03/12/2013, 02/24/2014   Influenza-Unspecified 02/09/2016   PFIZER(Purple Top)SARS-COV-2 Vaccination 06/24/2019, 07/15/2019, 04/28/2020   PNEUMOCOCCAL CONJUGATE-20 01/31/2021   Pneumococcal Conjugate-13 08/06/2013   Pneumococcal Polysaccharide-23 05/24/2011   Td 05/28/2004   Tdap 02/13/2006, 06/06/2017   Zoster, Live 05/30/2007   Screening Tests Health Maintenance  Topic Date Due   COVID-19 Vaccine (4 - 2023-24 season) 07/18/2022 (Originally 01/26/2022)   COLONOSCOPY (Pts 45-54yr Insurance coverage will need to be confirmed)  04/25/2023   Medicare Annual Wellness (AWV)  07/03/2023   DTaP/Tdap/Td (4 - Td or Tdap) 06/07/2027   Pneumonia Vaccine 80 Years old  Completed   INFLUENZA VACCINE  Completed   Hepatitis C  Screening  Completed   HPV VACCINES  Aged Out   Zoster Vaccines- Shingrix  Discontinued    Health Maintenance There are no preventive care reminders to display for this patient.  Hepatitis C Screening: Completed 01/2022.  Vision Screening: Recommended annual ophthalmology exams for early detection of glaucoma and other disorders of the eye.  Dental Screening: Recommended annual dental exams for proper oral hygiene  Community Resource Referral / Chronic Care Management: CRR required this visit?  No   CCM required this visit?  No      Plan:   Labs ordered today per patient request. Patient understands labs will be placed but not resulted from another provider. Labs will only be resulted ordered by pcp.    I have personally reviewed and noted the following in the patient's chart:   Medical and social history Use of alcohol, tobacco or illicit drugs  Current medications and supplements including opioid prescriptions. Patient is not currently taking opioid prescriptions. Functional ability and status Nutritional status Physical activity Advanced directives List of other physicians Hospitalizations, surgeries, and ER visits in previous 12 months Vitals Screenings  to include cognitive, depression, and falls Referrals and appointments  In addition, I have reviewed and discussed with patient certain preventive protocols, quality metrics, and best practice recommendations. A written personalized care plan for preventive services as well as general preventive health recommendations were provided to patient.     [place], LPN   [id number]    I have reviewed the above information and agree with above.   Deborra Medina, MD

## 2022-07-03 ENCOUNTER — Encounter: Payer: Self-pay | Admitting: *Deleted

## 2022-07-03 LAB — TESTOSTERONE TOTAL,FREE,BIO, MALES
Albumin: 4.4 g/dL (ref 3.6–5.1)
Sex Hormone Binding: 51 nmol/L (ref 22–77)
Testosterone, Bioavailable: 229 ng/dL — ABNORMAL HIGH (ref 15.0–150.0)
Testosterone, Free: 113.7 pg/mL — ABNORMAL HIGH (ref 6.0–73.0)
Testosterone: 1061 ng/dL — ABNORMAL HIGH (ref 250–827)

## 2022-07-03 LAB — DHEA-SULFATE: DHEA-SO4: 233 ug/dL — ABNORMAL HIGH (ref 3–225)

## 2022-07-03 LAB — ESTRADIOL: Estradiol: 24 pg/mL (ref <=39)

## 2022-07-03 NOTE — Telephone Encounter (Signed)
$  301 due; PA not required  Sent mychart to schedule appt. Due on or after 08/23/22.

## 2022-07-04 NOTE — Telephone Encounter (Signed)
Lab results have been faxed.

## 2022-07-04 NOTE — Telephone Encounter (Signed)
Pt is aware.  

## 2022-07-04 NOTE — Telephone Encounter (Signed)
Pt is calling about lab results

## 2022-07-04 NOTE — Telephone Encounter (Signed)
Patient states he is following-up to see if we have his lab results.  Patient states he has a minor procedure scheduled for tomorrow (a testosterone implant) at [facility].  Patient states they need the lab results in order to have the procedure.  Patient states he is supposed to have the procedure at 2:45pm tomorrow.   Patient states we may fax the results to [facility] at [phone].  Patient states he would like for [place] to please call.

## 2022-07-05 DIAGNOSIS — G479 Sleep disorder, unspecified: Secondary | ICD-10-CM | POA: Diagnosis not present

## 2022-07-05 DIAGNOSIS — E291 Testicular hypofunction: Secondary | ICD-10-CM | POA: Diagnosis not present

## 2022-07-05 DIAGNOSIS — Z6823 Body mass index (BMI) 23.0-23.9, adult: Secondary | ICD-10-CM | POA: Diagnosis not present

## 2022-07-05 DIAGNOSIS — M2550 Pain in unspecified joint: Secondary | ICD-10-CM | POA: Diagnosis not present

## 2022-07-10 LAB — TESTOSTERONE, FREE, TOTAL, SHBG
Sex Hormone Binding: 59 nmol/L (ref 19.3–76.4)
Testosterone, Free: 17.6 pg/mL (ref 6.6–18.1)
Testosterone: 1171 ng/dL — ABNORMAL HIGH (ref 264–916)

## 2022-07-12 DIAGNOSIS — R208 Other disturbances of skin sensation: Secondary | ICD-10-CM | POA: Diagnosis not present

## 2022-07-12 DIAGNOSIS — L538 Other specified erythematous conditions: Secondary | ICD-10-CM | POA: Diagnosis not present

## 2022-07-12 DIAGNOSIS — L720 Epidermal cyst: Secondary | ICD-10-CM | POA: Diagnosis not present

## 2022-07-12 DIAGNOSIS — L820 Inflamed seborrheic keratosis: Secondary | ICD-10-CM | POA: Diagnosis not present

## 2022-07-12 DIAGNOSIS — L728 Other follicular cysts of the skin and subcutaneous tissue: Secondary | ICD-10-CM | POA: Diagnosis not present

## 2022-07-23 ENCOUNTER — Encounter: Payer: Self-pay | Admitting: Internal Medicine

## 2022-07-23 ENCOUNTER — Inpatient Hospital Stay: Payer: PPO | Attending: Internal Medicine

## 2022-07-23 ENCOUNTER — Inpatient Hospital Stay (HOSPITAL_BASED_OUTPATIENT_CLINIC_OR_DEPARTMENT_OTHER): Payer: PPO | Admitting: Internal Medicine

## 2022-07-23 ENCOUNTER — Encounter: Payer: Self-pay | Admitting: *Deleted

## 2022-07-23 DIAGNOSIS — E039 Hypothyroidism, unspecified: Secondary | ICD-10-CM | POA: Insufficient documentation

## 2022-07-23 DIAGNOSIS — D696 Thrombocytopenia, unspecified: Secondary | ICD-10-CM

## 2022-07-23 DIAGNOSIS — K7460 Unspecified cirrhosis of liver: Secondary | ICD-10-CM

## 2022-07-23 DIAGNOSIS — Z79899 Other long term (current) drug therapy: Secondary | ICD-10-CM | POA: Insufficient documentation

## 2022-07-23 DIAGNOSIS — R790 Abnormal level of blood mineral: Secondary | ICD-10-CM | POA: Diagnosis not present

## 2022-07-23 LAB — CBC WITH DIFFERENTIAL/PLATELET
Abs Immature Granulocytes: 0.01 10*3/uL (ref 0.00–0.07)
Basophils Absolute: 0 10*3/uL (ref 0.0–0.1)
Basophils Relative: 1 %
Eosinophils Absolute: 0.1 10*3/uL (ref 0.0–0.5)
Eosinophils Relative: 2 %
HCT: 42.2 % (ref 39.0–52.0)
Hemoglobin: 14.7 g/dL (ref 13.0–17.0)
Immature Granulocytes: 0 %
Lymphocytes Relative: 21 %
Lymphs Abs: 1.2 10*3/uL (ref 0.7–4.0)
MCH: 33.4 pg (ref 26.0–34.0)
MCHC: 34.8 g/dL (ref 30.0–36.0)
MCV: 95.9 fL (ref 80.0–100.0)
Monocytes Absolute: 0.4 10*3/uL (ref 0.1–1.0)
Monocytes Relative: 7 %
Neutro Abs: 4.1 10*3/uL (ref 1.7–7.7)
Neutrophils Relative %: 69 %
Platelets: 142 10*3/uL — ABNORMAL LOW (ref 150–400)
RBC: 4.4 MIL/uL (ref 4.22–5.81)
RDW: 11.8 % (ref 11.5–15.5)
WBC: 5.9 10*3/uL (ref 4.0–10.5)
nRBC: 0 % (ref 0.0–0.2)

## 2022-07-23 LAB — IRON AND TIBC
Iron: 142 ug/dL (ref 45–182)
Saturation Ratios: 38 % (ref 17.9–39.5)
TIBC: 377 ug/dL (ref 250–450)
UIBC: 235 ug/dL

## 2022-07-23 LAB — FERRITIN: Ferritin: 274 ng/mL (ref 24–336)

## 2022-07-23 NOTE — Progress Notes (Signed)
[place]  Telephone:(336) [phone] Fax:(336) [phone]  ID: Tony Luna OB: 1940-08-19  MR#: [id number]  [id number]  Patient Care Team: [name], MD as PCP - General (Internal Medicine) Kate Sable, MD as PCP - Cardiology (Cardiology) Jane Canary, MD as Consulting Physician (Oncology)   HPI: Tony Luna is a 82 y.o. male with past medical history of hypothyroidism, liver cirrhosis, and mild AR was seen in hematology clinic for elevated iron and iron saturation.  Patient reports diagnosis of liver cirrhosis in 2016. US liver elastography on 07/31/2016 showed morphologic features of cirrhosis, fibrosis score of some F3 plus F4 with high risk of fibrosis.  Hepatitis B and C done in 2017 was negative.  He reports drinking 2 to 3 glasses of wine every day. He was seen by Dr. 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 He follows with Dr. Derrel Nip his PCP for surveillance of [facility].  He reports arthritis in back, shoulder and left knee. He follows with Dr. Bernardo Heater for BPH and erectile dysfunction and uses sildenafil intermittently.  Of chart, he is also seeing urology in [facility] for hypogonadism and subcutaneous testosterone pellet implantation. Echocardiogram in November 2021 showed normal EF with grade 1 diastolic dysfunction and mild AR. He denies any family history of iron overload or hemochromatosis. Denies skin pigmentation.   Patient denies fever, chills, nausea, vomiting, shortness of breath, cough, abdominal pain, bleeding, bowel or bladder issues. Energy level is good.  Appetite is good.  Denies any weight loss. Denies pain.  Labs from 02/02/2022 -CBC showed WBC of 4, hemoglobin 14.4, platelet 139.  He has chronic thrombocytopenia with platelets ranging 110 to 140s since 2016.  AST mildly elevated to 41, ALT normal, T. bili 1.3. Ferritin in 01/2018 248, in 04/2019 490, 01/2022 309. Percent saturation in 11/2015 of 33, in 01/2022 59%  INTERVAL  HISTORY-  Patient was seen today to discuss labs. He continues to feel good.  Continues to exercise 3-4 times a week in the gym.  Just reports multiple nighttime awakenings takes Ambien.  Was seen by sleep doctor and was recommended CPAP machine which she used and did not notice any benefit.   REVIEW OF SYSTEMS:   Review of Systems  All other systems reviewed and are negative.   As per HPI. Otherwise, a complete review of systems is negative.  PAST MEDICAL HISTORY: Past Medical History:  Diagnosis Date   Allergy    Arthritis    Cirrhosis ([place])    Complication of anesthesia    :"need catheter"   Dysrhythmia    ? something told by reg dr  unable to say what it was   Esophageal tear    Hypothyroidism    Pneumonia    hx   Primary osteoarthritis of left knee 06/22/2014   Rotator cuff tear     PAST SURGICAL HISTORY: Past Surgical History:  Procedure Laterality Date   BACK SURGERY     blepharaplasty     CATARACT EXTRACTION W/PHACO Left 02/21/2021   Procedure: CATARACT EXTRACTION PHACO AND INTRAOCULAR LENS PLACEMENT ([place]) LEFT 4.56 00:34.9;  Surgeon: Birder Robson, MD;  Location: [facility];  Service: Ophthalmology;  Laterality: Left;   CAUTERY OF TURBINATES     COLONOSCOPY WITH PROPOFOL N/A 07/19/2015   Procedure: COLONOSCOPY WITH PROPOFOL;  Surgeon: [name], MD;  Location: [facility] ENDOSCOPY;  Service: Endoscopy;  Laterality: N/A;   COLONOSCOPY WITH PROPOFOL N/A 03/21/2021   Procedure: COLONOSCOPY WITH PROPOFOL;  Surgeon:  [name], MD;  Location: [facility] ENDOSCOPY;  Service: Endoscopy;  Laterality: N/A;   COLONOSCOPY WITH PROPOFOL N/A 04/18/2021   Procedure: COLONOSCOPY WITH PROPOFOL;  Surgeon: [name], MD;  Location: [facility] ENDOSCOPY;  Service: Endoscopy;  Laterality: N/A;   COLONOSCOPY WITH PROPOFOL N/A 04/24/2022   Procedure: COLONOSCOPY WITH PROPOFOL;  Surgeon: [name], MD;  Location: [facility] ENDOSCOPY;  Service: Endoscopy;  Laterality: N/A;   EYE  SURGERY     HERNIA REPAIR Left    inguinal herniorrhapy   KNEE SURGERY     ? loose body/ chondral defect 30yr   ROTATOR CUFF REPAIR     left   SPINE SURGERY     TONSILLECTOMY     TOTAL KNEE ARTHROPLASTY Left 06/22/2014   Procedure: TOTAL KNEE ARTHROPLASTY;  Surgeon: JJohnny Bridge MD;  Location: [facility]  Service: Orthopedics;  Laterality: Left;    FAMILY HISTORY: Family History  Problem Relation Age of Onset   Alcohol abuse Mother    Diabetes Mother    Mental retardation Mother     HEALTH MAINTENANCE: Social History   Tobacco Use   Smoking status: Former    Packs/day: 1.00    Years: 15.00    Total pack years: 15.00    Types: Cigarettes    Quit date: 06/15/1983    Years since quitting: 39.1   Smokeless tobacco: Current    Types: Chew  Vaping Use   Vaping Use: Never used  Substance Use Topics   Alcohol use: Yes    Alcohol/week: 10.0 standard drinks of alcohol    Types: 10 Glasses of wine per week   Drug use: No     No Known Allergies  Current Outpatient Medications  Medication Sig Dispense Refill   anastrozole (ARIMIDEX) 1 MG tablet Take 1 mg by mouth once a week.     azithromycin (ZITHROMAX) 500 MG tablet Take 1 tablet (500 mg total) by mouth daily. 7 tablet 0   Boron 6 MG TABS Take by mouth daily.     clonazepam (KLONOPIN) 0.125 MG disintegrating tablet Take 1 tablet (0.125 mg total) by mouth daily. In the morning as needed for anxiety 30 tablet 0   doxycycline (VIBRA-TABS) 100 MG tablet Take 1 tablet (100 mg total) by mouth 2 (two) times daily. 56 tablet 2   Multiple Vitamin (MULTIVITAMIN WITH MINERALS) TABS tablet Take 1 tablet by mouth 2 (two) times daily.     NON FORMULARY Sharp Thought Takes 2 capsule bid.     NP THYROID 60 MG tablet TAKE ONE TABLET BY MOUTH EVERY DAY 90 tablet 1   OVER THE COUNTER MEDICATION Take 1 capsule by mouth 3 (three) times daily. Instaflex     OVER THE COUNTER MEDICATION Take 250 mg by mouth daily. Bio Magnesium Citrate      OVER THE COUNTER MEDICATION Take 20,000 mcg by mouth daily. Vitamin B12-methylfolate     OVER THE COUNTER MEDICATION Take 800 mcg by mouth daily. Active B Folate     OVER THE COUNTER MEDICATION 2 tablets daily. VitaPrime     OVER THE COUNTER MEDICATION 1,000 mg daily. Super Bio-C buffered     OVER THE COUNTER MEDICATION 1,000 mg daily. L'Arginine     Saw Palmetto 160 MG TABS daily in the afternoon.     sildenafil (VIAGRA) 50 MG tablet USE AS DIRECTED 90 tablet 0   sulfacetamide (BLEPH-10) 10 % ophthalmic solution SMARTSIG:In Eye(s)     tadalafil (CIALIS) 20 MG tablet TAKE ONE TABLET  DAILY AS NEEDED FOR ERECTILE DYSFUNCTION 240 tablet 0   tamsulosin (FLOMAX) 0.4 MG CAPS capsule TAKE 1 CAPSULE BY MOUTH ONCE DAILY 90 capsule 1   Testosterone 75 MG PLLT by Implant route.     zolpidem (AMBIEN CR) 6.25 MG CR tablet Take 1 tablet (6.25 mg total) by mouth at bedtime as needed. for sleep 90 tablet 0   No current facility-administered medications for this visit.    OBJECTIVE: Vitals:   07/23/22 1516  BP: (!) 148/90  Pulse: 96  Resp: 18  SpO2: 96%     Body mass index is 23.46 kg/m.      General: Well-developed, well-nourished, no acute distress. Eyes: Pink conjunctiva, anicteric sclera. HEENT: Normocephalic, moist mucous membranes, clear oropharnyx. Lungs: Clear to auscultation bilaterally. Heart: Regular rate and rhythm. No rubs, murmurs, or gallops. Abdomen: Soft, nontender, nondistended. No organomegaly noted, normoactive bowel sounds. Musculoskeletal: No edema, cyanosis, or clubbing. Neuro: Alert, answering all questions appropriately. Cranial nerves grossly intact. Skin: No rashes or petechiae noted. Psych: Normal affect. Lymphatics: No cervical, calvicular, axillary or inguinal LAD.   LAB RESULTS:  Lab Results  Component Value Date   NA 142 07/02/2022   K 4.0 07/02/2022   CL 102 07/02/2022   CO2 28 07/02/2022   GLUCOSE 107 (H) 07/02/2022   BUN 14 07/02/2022   CREATININE  1.19 07/02/2022   CALCIUM 9.3 07/02/2022   PROT 7.0 07/02/2022   ALBUMIN 4.4 07/02/2022   AST 35 07/02/2022   ALT 27 07/02/2022   ALKPHOS 86 07/02/2022   BILITOT 1.0 07/02/2022   GFRNONAA 77 04/30/2019   GFRAA 90 04/30/2019    Lab Results  Component Value Date   WBC 5.9 07/23/2022   NEUTROABS 4.1 07/23/2022   HGB 14.7 07/23/2022   HCT 42.2 07/23/2022   MCV 95.9 07/23/2022   PLT 142 (L) 07/23/2022    Lab Results  Component Value Date   TIBC 358 04/12/2022   TIBC 326 02/02/2022   TIBC 354 12/26/2015   FERRITIN 295 04/12/2022   FERRITIN 309 02/02/2022   FERRITIN 490 04/30/2019   IRONPCTSAT 32 04/12/2022   IRONPCTSAT 59 (H) 02/02/2022   IRONPCTSAT 33 12/26/2015     STUDIES: No results found.  ASSESSMENT AND PLAN:   Tony Luna is a 82 y.o. male with pmh of hypothyroidism, liver cirrhosis, and mild AR was seen in hematology clinic for elevated iron and iron saturation.  # Iron overload # Liver cirrhosis  - Ferritin in 01/2018 248, in 04/2019 490, 01/2022 309. - Percent saturation in 11/2015 of 33, in 01/2022 59%  - Patient reports diagnosis of liver cirrhosis in 2016. US liver elastography on 07/31/2016 showed morphologic features of cirrhosis, fibrosis score of some F3 plus F4 with high risk of fibrosis.  Hepatitis B and C done in 2017 was negative.  He reports drinking 2 to 3 glasses of wine every day. He was seen by Dr. 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 Denies family hx of iron overload.   - Hemochromatosis gene panel for H63D and C282Y negative.   -His elevated iron level and saturation is likely from the liver cirrhosis.  Last iron levels showed normal saturation at 32%.  No indication for phlebotomy.  Will recheck iron levels today.  # Thrombocytopenia  - likely secondary to liver cirrhosis.  Stable - b12, folate normal - hepatitis B and C negative     Orders Placed This Encounter  Procedures   Iron and  TIBC   Ferritin   CBC with  Differential (Cancer Center Only)   RTC in 6 months for MD visit, labs.  Patient expressed understanding and was in agreement with this plan. He also understands that He can call clinic at any time with any questions, concerns, or complaints.   I spent a total of 35 minutes reviewing chart data, face-to-face evaluation with the patient, counseling and coordination of care as detailed above.  Jane Canary, MD   07/23/2022 4:16 PM

## 2022-07-24 ENCOUNTER — Inpatient Hospital Stay: Payer: PPO

## 2022-07-31 ENCOUNTER — Other Ambulatory Visit: Payer: Self-pay

## 2022-07-31 ENCOUNTER — Telehealth: Payer: Self-pay | Admitting: Internal Medicine

## 2022-07-31 MED ORDER — ZOLPIDEM TARTRATE ER 6.25 MG PO TBCR: 6.2500 mg | EXTENDED_RELEASE_TABLET | Freq: Every evening | ORAL | 0 refills | Status: DC | PRN

## 2022-07-31 NOTE — Telephone Encounter (Signed)
Prescription Request  07/31/2022  LOV: 02/02/2022  What is the name of the medication or equipment? zolpidem (AMBIEN CR) 6.25 MG CR tablet  Have you contacted your pharmacy to request a refill? Yes   Which pharmacy would you like this sent to?  [facility] - [place], [place] - [address] [address] Phone: [phone] Fax: [phone]    Patient notified that their request is being sent to the clinical staff for review and that they should receive a response within 2 business days.   Please advise at Mobile [phone] (mobile)

## 2022-07-31 NOTE — Telephone Encounter (Signed)
Sent to pcp for approval

## 2022-08-27 ENCOUNTER — Ambulatory Visit: Payer: PPO

## 2022-08-27 DIAGNOSIS — M810 Age-related osteoporosis without current pathological fracture: Secondary | ICD-10-CM | POA: Diagnosis not present

## 2022-08-27 MED ORDER — DENOSUMAB 60 MG/ML ~~LOC~~ SOSY
60.0000 mg | PREFILLED_SYRINGE | Freq: Once | SUBCUTANEOUS | Status: AC
  Administered 2022-08-27: 60 mg via SUBCUTANEOUS

## 2022-08-27 NOTE — Progress Notes (Signed)
Pt presented for their subcutaneous Prolia injection. Pt was identified through two identifiers. Pt was given the information packets about the Prolia and told to schedule their next injection 6 months out. Pt tolerated the subq injection well in the left arm.  

## 2022-09-08 ENCOUNTER — Other Ambulatory Visit: Payer: Self-pay | Admitting: Internal Medicine

## 2022-09-11 ENCOUNTER — Encounter: Payer: Self-pay | Admitting: Internal Medicine

## 2022-10-09 ENCOUNTER — Ambulatory Visit: Payer: PPO | Attending: Cardiology

## 2022-10-09 DIAGNOSIS — R030 Elevated blood-pressure reading, without diagnosis of hypertension: Secondary | ICD-10-CM

## 2022-10-09 DIAGNOSIS — I5189 Other ill-defined heart diseases: Secondary | ICD-10-CM

## 2022-10-09 DIAGNOSIS — I350 Nonrheumatic aortic (valve) stenosis: Secondary | ICD-10-CM

## 2022-10-09 DIAGNOSIS — I351 Nonrheumatic aortic (valve) insufficiency: Secondary | ICD-10-CM

## 2022-10-09 HISTORY — DX: Nonrheumatic aortic (valve) stenosis: I35.0

## 2022-10-09 HISTORY — DX: Other ill-defined heart diseases: I51.89

## 2022-10-09 HISTORY — DX: Nonrheumatic aortic (valve) insufficiency: I35.1

## 2022-10-09 LAB — ECHOCARDIOGRAM COMPLETE
AR max vel: 1.55 cm2
AV Area VTI: 1.55 cm2
AV Area mean vel: 1.52 cm2
AV Mean grad: 9.5 mmHg
AV Peak grad: 17.1 mmHg
Ao pk vel: 2.07 m/s
Area-P 1/2: 3.48 cm2
Calc EF: 53.6 %
P 1/2 time: 492 msec
S' Lateral: 3.7 cm
Single Plane A2C EF: 52.1 %
Single Plane A4C EF: 53.2 %

## 2022-10-25 DIAGNOSIS — B078 Other viral warts: Secondary | ICD-10-CM | POA: Diagnosis not present

## 2022-10-25 DIAGNOSIS — Z08 Encounter for follow-up examination after completed treatment for malignant neoplasm: Secondary | ICD-10-CM | POA: Diagnosis not present

## 2022-10-25 DIAGNOSIS — L708 Other acne: Secondary | ICD-10-CM | POA: Diagnosis not present

## 2022-10-25 DIAGNOSIS — L570 Actinic keratosis: Secondary | ICD-10-CM | POA: Diagnosis not present

## 2022-10-25 DIAGNOSIS — Z09 Encounter for follow-up examination after completed treatment for conditions other than malignant neoplasm: Secondary | ICD-10-CM | POA: Diagnosis not present

## 2022-10-25 DIAGNOSIS — L538 Other specified erythematous conditions: Secondary | ICD-10-CM | POA: Diagnosis not present

## 2022-10-25 DIAGNOSIS — Z872 Personal history of diseases of the skin and subcutaneous tissue: Secondary | ICD-10-CM | POA: Diagnosis not present

## 2022-10-25 DIAGNOSIS — Z85828 Personal history of other malignant neoplasm of skin: Secondary | ICD-10-CM | POA: Diagnosis not present

## 2022-11-06 DIAGNOSIS — E291 Testicular hypofunction: Secondary | ICD-10-CM | POA: Diagnosis not present

## 2022-11-07 ENCOUNTER — Other Ambulatory Visit: Payer: Self-pay | Admitting: Internal Medicine

## 2022-11-07 NOTE — Telephone Encounter (Signed)
Refilled: 07/31/2022 Last OV: 02/02/2022 Next OV: not scheduled

## 2022-11-08 DIAGNOSIS — E291 Testicular hypofunction: Secondary | ICD-10-CM | POA: Diagnosis not present

## 2022-11-08 DIAGNOSIS — Z79890 Hormone replacement therapy (postmenopausal): Secondary | ICD-10-CM | POA: Diagnosis not present

## 2022-11-08 DIAGNOSIS — M2550 Pain in unspecified joint: Secondary | ICD-10-CM | POA: Diagnosis not present

## 2022-11-08 DIAGNOSIS — R6882 Decreased libido: Secondary | ICD-10-CM | POA: Diagnosis not present

## 2022-11-08 DIAGNOSIS — Z6822 Body mass index (BMI) 22.0-22.9, adult: Secondary | ICD-10-CM | POA: Diagnosis not present

## 2022-11-13 ENCOUNTER — Other Ambulatory Visit: Payer: Self-pay | Admitting: Internal Medicine

## 2022-12-05 ENCOUNTER — Other Ambulatory Visit: Payer: Self-pay | Admitting: Internal Medicine

## 2023-01-04 ENCOUNTER — Encounter: Payer: Self-pay | Admitting: *Deleted

## 2023-01-04 ENCOUNTER — Other Ambulatory Visit: Payer: Self-pay | Admitting: *Deleted

## 2023-01-04 DIAGNOSIS — M810 Age-related osteoporosis without current pathological fracture: Secondary | ICD-10-CM

## 2023-01-04 DIAGNOSIS — R030 Elevated blood-pressure reading, without diagnosis of hypertension: Secondary | ICD-10-CM

## 2023-01-07 ENCOUNTER — Other Ambulatory Visit: Payer: Self-pay | Admitting: Internal Medicine

## 2023-01-07 NOTE — Telephone Encounter (Signed)
Patient called and needs to get a few medications refilled. I scheduled him an appoinment but its not til October 7th. He really needs 2 of his medicines refilled. They are  zolpidem (AMBIEN CR) 6.25 MG CR tablet and  NP THYROID 60 MG tablet his pharmacy is [facility] - [place], [place] - [address] [place], [place] [address] Phone: [phone]  Fax: [phone]  He states he only has a 1 week left of medication.

## 2023-01-07 NOTE — Telephone Encounter (Signed)
Spoke with pt and informed him that he has refills for the thyroid medicine at the pharmacy. Pt does need a refill on the Zolpidem.   Refilled: 11/07/2022 Last OV: 02/02/2022 Next OV: 03/04/2023

## 2023-01-08 MED ORDER — ZOLPIDEM TARTRATE ER 6.25 MG PO TBCR: 6.2500 mg | EXTENDED_RELEASE_TABLET | Freq: Every evening | ORAL | 0 refills | Status: DC | PRN

## 2023-01-12 ENCOUNTER — Other Ambulatory Visit: Payer: Self-pay | Admitting: Internal Medicine

## 2023-01-21 ENCOUNTER — Inpatient Hospital Stay: Payer: PPO | Admitting: Internal Medicine

## 2023-01-21 ENCOUNTER — Inpatient Hospital Stay: Payer: PPO | Attending: Internal Medicine

## 2023-01-21 DIAGNOSIS — R790 Abnormal level of blood mineral: Secondary | ICD-10-CM | POA: Diagnosis not present

## 2023-01-21 DIAGNOSIS — D696 Thrombocytopenia, unspecified: Secondary | ICD-10-CM | POA: Insufficient documentation

## 2023-01-21 DIAGNOSIS — K7460 Unspecified cirrhosis of liver: Secondary | ICD-10-CM

## 2023-01-21 DIAGNOSIS — Z87891 Personal history of nicotine dependence: Secondary | ICD-10-CM | POA: Insufficient documentation

## 2023-01-21 LAB — IRON AND TIBC
Iron: 172 ug/dL (ref 45–182)
Saturation Ratios: 65 % — ABNORMAL HIGH (ref 17.9–39.5)
TIBC: 266 ug/dL (ref 250–450)
UIBC: 94 ug/dL

## 2023-01-21 LAB — CBC WITH DIFFERENTIAL (CANCER CENTER ONLY)
Abs Immature Granulocytes: 0.02 10*3/uL (ref 0.00–0.07)
Basophils Absolute: 0 10*3/uL (ref 0.0–0.1)
Basophils Relative: 1 %
Eosinophils Absolute: 0.1 10*3/uL (ref 0.0–0.5)
Eosinophils Relative: 2 %
HCT: 39.1 % (ref 39.0–52.0)
Hemoglobin: 13.5 g/dL (ref 13.0–17.0)
Immature Granulocytes: 1 %
Lymphocytes Relative: 26 %
Lymphs Abs: 1 10*3/uL (ref 0.7–4.0)
MCH: 34.6 pg — ABNORMAL HIGH (ref 26.0–34.0)
MCHC: 34.5 g/dL (ref 30.0–36.0)
MCV: 100.3 fL — ABNORMAL HIGH (ref 80.0–100.0)
Monocytes Absolute: 0.4 10*3/uL (ref 0.1–1.0)
Monocytes Relative: 10 %
Neutro Abs: 2.3 10*3/uL (ref 1.7–7.7)
Neutrophils Relative %: 60 %
Platelet Count: 119 10*3/uL — ABNORMAL LOW (ref 150–400)
RBC: 3.9 MIL/uL — ABNORMAL LOW (ref 4.22–5.81)
RDW: 11.8 % (ref 11.5–15.5)
WBC Count: 3.8 10*3/uL — ABNORMAL LOW (ref 4.0–10.5)
nRBC: 0 % (ref 0.0–0.2)

## 2023-01-21 LAB — FERRITIN: Ferritin: 673 ng/mL — ABNORMAL HIGH (ref 24–336)

## 2023-01-21 NOTE — Progress Notes (Signed)
[name]  Telephone:(336) [phone] Fax:(336) [phone]  ID: Tony Luna OB: April 23, 1941  MR#: [id number]  QIO#:[id number]  Patient Care Team: Sherlene Shams, MD as PCP - General (Internal Medicine) Debbe Odea, MD as PCP - Cardiology (Cardiology) Michaelyn Barter, MD as Consulting Physician (Oncology)   HPI: Tony Luna is a 82 y.o. male with past medical history of hypothyroidism, liver cirrhosis, and mild AR was seen in hematology clinic for elevated iron and iron saturation.  Patient reports diagnosis of liver cirrhosis in 2016. US liver elastography on 07/31/2016 showed morphologic features of cirrhosis, fibrosis score of some F3 plus F4 with high risk of fibrosis.  Hepatitis B and C done in 2017 was negative.  He reports drinking 2 to 3 glasses of wine every day. He was seen by Dr. 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 He follows with Dr. Darrick Huntsman his PCP for surveillance of Tony Luna.  He reports arthritis in back, shoulder and left knee. He follows with Dr. Lonna Cobb for BPH and erectile dysfunction and uses sildenafil intermittently.  Of chart, he is also seeing urology in [facility] for hypogonadism and subcutaneous testosterone pellet implantation. Echocardiogram in November 2021 showed normal EF with grade 1 diastolic dysfunction and mild AR. He denies any family history of iron overload or hemochromatosis. Denies skin pigmentation.   Patient denies fever, chills, nausea, vomiting, shortness of breath, cough, abdominal pain, bleeding, bowel or bladder issues. Energy level is good.  Appetite is good.  Denies any weight loss. Denies pain.  Labs from 02/02/2022 -CBC showed WBC of 4, hemoglobin 14.4, platelet 139.  He has chronic thrombocytopenia with platelets ranging 110 to 140s since 2016.  AST mildly elevated to 41, ALT normal, T. bili 1.3. Ferritin in 01/2018 248, in 04/2019 490, 01/2022 309. Percent saturation in 11/2015 of 33, in 01/2022 59%  INTERVAL  HISTORY-  Patient was seen today to discuss labs. He continues to feel good.  Continues to exercise 3-4 times a week in the gym.  Just reports multiple nighttime awakenings takes Ambien.  Was seen by sleep doctor and was recommended CPAP machine which she used and did not notice any benefit.   REVIEW OF SYSTEMS:   Review of Systems  All other systems reviewed and are negative.   As per HPI. Otherwise, a complete review of systems is negative.  PAST MEDICAL HISTORY: Past Medical History:  Diagnosis Date   Allergy    Arthritis    Cirrhosis ([facility])    Complication of anesthesia    :"need catheter"   Dysrhythmia    ? something told by reg dr  unable to say what it was   Esophageal tear    Hypothyroidism    Pneumonia    hx   Primary osteoarthritis of left knee 06/22/2014   Rotator cuff tear     PAST SURGICAL HISTORY: Past Surgical History:  Procedure Laterality Date   BACK SURGERY     blepharaplasty     CATARACT EXTRACTION W/PHACO Left 02/21/2021   Procedure: CATARACT EXTRACTION PHACO AND INTRAOCULAR LENS PLACEMENT ([facility]) LEFT 4.56 00:34.9;  Surgeon: Galen Manila, MD;  Location: [facility];  Service: Ophthalmology;  Laterality: Left;   CAUTERY OF TURBINATES     COLONOSCOPY WITH PROPOFOL N/A 07/19/2015   Procedure: COLONOSCOPY WITH PROPOFOL;  Surgeon: Midge Minium, MD;  Location: [facility] ENDOSCOPY;  Service: Endoscopy;  Laterality: N/A;   COLONOSCOPY WITH PROPOFOL N/A 03/21/2021   Procedure: COLONOSCOPY WITH PROPOFOL;  Surgeon:  Midge Minium, MD;  Location: [facility] ENDOSCOPY;  Service: Endoscopy;  Laterality: N/A;   COLONOSCOPY WITH PROPOFOL N/A 04/18/2021   Procedure: COLONOSCOPY WITH PROPOFOL;  Surgeon: Midge Minium, MD;  [facility];  Service: Endoscopy;  Laterality: N/A;   COLONOSCOPY WITH PROPOFOL N/A 04/24/2022   Procedure: COLONOSCOPY WITH PROPOFOL;  Surgeon: Midge Minium, MD;  Location: [facility] ENDOSCOPY;  Service: Endoscopy;  Laterality: N/A;   EYE  SURGERY     HERNIA REPAIR Left    inguinal herniorrhapy   KNEE SURGERY     ? loose body/ chondral defect 11yrs   ROTATOR CUFF REPAIR     left   SPINE SURGERY     TONSILLECTOMY     TOTAL KNEE ARTHROPLASTY Left 06/22/2014   Procedure: TOTAL KNEE ARTHROPLASTY;  Surgeon: Eulas Post, MD;  Location: [facility];  Service: Orthopedics;  Laterality: Left;    FAMILY HISTORY: Family History  Problem Relation Age of Onset   Alcohol abuse Mother    Diabetes Mother    Mental retardation Mother     HEALTH MAINTENANCE: Social History   Tobacco Use   Smoking status: Former    Current packs/day: 0.00    Average packs/day: 1 pack/day for 15.0 years (15.0 ttl pk-yrs)    Types: Cigarettes    Start date: 06/14/1968    Quit date: 06/15/1983    Years since quitting: 39.6   Smokeless tobacco: Current    Types: Chew  Vaping Use   Vaping status: Never Used  Substance Use Topics   Alcohol use: Yes    Alcohol/week: 10.0 standard drinks of alcohol    Types: 10 Glasses of wine per week   Drug use: No     No Known Allergies  Current Outpatient Medications  Medication Sig Dispense Refill   anastrozole (ARIMIDEX) 1 MG tablet Take 1 mg by mouth once a week.     Boron 6 MG TABS Take by mouth daily.     clonazepam (KLONOPIN) 0.125 MG disintegrating tablet Take 1 tablet (0.125 mg total) by mouth daily. In the morning as needed for anxiety 30 tablet 0   doxycycline (VIBRA-TABS) 100 MG tablet Take 1 tablet (100 mg total) by mouth 2 (two) times daily. 56 tablet 2   Multiple Vitamin (MULTIVITAMIN WITH MINERALS) TABS tablet Take 1 tablet by mouth 2 (two) times daily.     NP THYROID 60 MG tablet TAKE ONE TABLET BY MOUTH EVERY DAY 90 tablet 1   OVER THE COUNTER MEDICATION Take 1 capsule by mouth 3 (three) times daily. Instaflex     OVER THE COUNTER MEDICATION Take 250 mg by mouth daily. Bio Magnesium Citrate     OVER THE COUNTER MEDICATION Take 20,000 mcg by mouth daily. Vitamin B12-methylfolate     OVER  THE COUNTER MEDICATION Take 800 mcg by mouth daily. Active B Folate     OVER THE COUNTER MEDICATION 2 tablets daily. VitaPrime     OVER THE COUNTER MEDICATION 1,000 mg daily. Super Bio-C buffered     OVER THE COUNTER MEDICATION 1,000 mg daily. L'Arginine     Saw Palmetto 160 MG TABS daily in the afternoon.     sildenafil (VIAGRA) 50 MG tablet USE AS DIRECTED 90 tablet 0   tadalafil (CIALIS) 20 MG tablet TAKE ONE TABLET DAILY AS NEEDED FOR ERECTILE DYSFUNCTION 240 tablet 0   tamsulosin (FLOMAX) 0.4 MG CAPS capsule TAKE 1 CAPSULE BY MOUTH ONCE DAILY 90 capsule 1   Testosterone 75 MG PLLT by Implant  route.     traMADol (ULTRAM) 50 MG tablet TAKE ONE TABLET EVERY 6 HOURS AS NEEDED FOR PAIN 28 tablet 0   zolpidem (AMBIEN CR) 6.25 MG CR tablet Take 1 tablet (6.25 mg total) by mouth at bedtime as needed. for sleep 90 tablet 0   No current facility-administered medications for this visit.    OBJECTIVE: Vitals:   01/21/23 1006  BP: 112/76  Pulse: 80  Temp: 98.6 F (37 C)  SpO2: 96%     Body mass index is 22.11 kg/m.      General: Well-developed, well-nourished, no acute distress. Eyes: Pink conjunctiva, anicteric sclera. HEENT: Normocephalic, moist mucous membranes, clear oropharnyx. Lungs: Clear to auscultation bilaterally. Heart: Regular rate and rhythm. No rubs, murmurs, or gallops. Abdomen: Soft, nontender, nondistended. No organomegaly noted, normoactive bowel sounds. Musculoskeletal: No edema, cyanosis, or clubbing. Neuro: Alert, answering all questions appropriately. Cranial nerves grossly intact. Skin: No rashes or petechiae noted. Psych: Normal affect. Lymphatics: No cervical, calvicular, axillary or inguinal LAD.   LAB RESULTS:  Lab Results  Component Value Date   NA 142 07/02/2022   K 4.0 07/02/2022   CL 102 07/02/2022   CO2 28 07/02/2022   GLUCOSE 107 (H) 07/02/2022   BUN 14 07/02/2022   CREATININE 1.19 07/02/2022   CALCIUM 9.3 07/02/2022   PROT 7.0 07/02/2022    ALBUMIN 4.4 07/02/2022   AST 35 07/02/2022   ALT 27 07/02/2022   ALKPHOS 86 07/02/2022   BILITOT 1.0 07/02/2022   GFRNONAA 77 04/30/2019   GFRAA 90 04/30/2019    Lab Results  Component Value Date   WBC 3.8 (L) 01/21/2023   NEUTROABS 2.3 01/21/2023   HGB 13.5 01/21/2023   HCT 39.1 01/21/2023   MCV 100.3 (H) 01/21/2023   PLT 119 (L) 01/21/2023    Lab Results  Component Value Date   TIBC 266 01/21/2023   TIBC 377 07/23/2022   TIBC 358 04/12/2022   FERRITIN 673 (H) 01/21/2023   FERRITIN 274 07/23/2022   FERRITIN 295 04/12/2022   IRONPCTSAT 65 (H) 01/21/2023   IRONPCTSAT 38 07/23/2022   IRONPCTSAT 32 04/12/2022     STUDIES: No results found.  ASSESSMENT AND PLAN:   BRITTANI PENISTON is a 82 y.o. male with pmh of hypothyroidism, liver cirrhosis, and mild AR was seen in hematology clinic for elevated iron and iron saturation.  # Iron overload # Liver cirrhosis  - Ferritin in 01/2018 248, in 04/2019 490, 01/2022 309. - Percent saturation in 11/2015 of 33, in 01/2022 59%  - Patient reports diagnosis of liver cirrhosis in 2016. US liver elastography on 07/31/2016 showed morphologic features of cirrhosis, fibrosis score of some F3 plus F4 with high risk of fibrosis.  Hepatitis B and C done in 2017 was negative.  He reports drinking 2 to 3 glasses of wine every day. He was seen by Dr. 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 Denies family hx of iron overload.   - Hemochromatosis gene panel for H63D and C282Y negative.   -His elevated iron level and saturation is likely from the liver cirrhosis.  Last iron levels showed normal saturation at 32%.  No indication for phlebotomy.   - Labs from February 2024 ferritin 274, saturation 38%.  Repeat iron panel pending today.  Addendum- Ferritin 673, saturation 65%, WBC 3.8, hemoglobin 13.5 and platelet 119. Will reach out to the patient to have repeat iron studies in 2 to 3 weeks.  If levels are persistently high  will consider for  phlebotomy.  # Thrombocytopenia  - likely secondary to liver cirrhosis.  Stable - b12, folate normal - hepatitis B and C negative    Orders Placed This Encounter  Procedures   Iron and TIBC   Ferritin   Iron and TIBC   Ferritin   CBC with Differential/Platelet   Comprehensive metabolic panel   RTC in 6 months for MD visit, labs Will schedule for labs in 2 to 3 weeks for repeat iron panel  Patient expressed understanding and was in agreement with this plan. He also understands that He can call clinic at any time with any questions, concerns, or complaints.   I spent a total of 25 minutes reviewing chart data, face-to-face evaluation with the patient, counseling and coordination of care as detailed above.  Michaelyn Barter, MD   01/21/2023 3:44 PM

## 2023-01-22 DIAGNOSIS — H6123 Impacted cerumen, bilateral: Secondary | ICD-10-CM | POA: Diagnosis not present

## 2023-01-22 DIAGNOSIS — H903 Sensorineural hearing loss, bilateral: Secondary | ICD-10-CM | POA: Diagnosis not present

## 2023-01-24 DIAGNOSIS — D225 Melanocytic nevi of trunk: Secondary | ICD-10-CM | POA: Diagnosis not present

## 2023-01-24 DIAGNOSIS — L570 Actinic keratosis: Secondary | ICD-10-CM | POA: Diagnosis not present

## 2023-01-24 DIAGNOSIS — Z85828 Personal history of other malignant neoplasm of skin: Secondary | ICD-10-CM | POA: Diagnosis not present

## 2023-01-24 DIAGNOSIS — D2262 Melanocytic nevi of left upper limb, including shoulder: Secondary | ICD-10-CM | POA: Diagnosis not present

## 2023-01-24 DIAGNOSIS — D692 Other nonthrombocytopenic purpura: Secondary | ICD-10-CM | POA: Diagnosis not present

## 2023-01-24 DIAGNOSIS — Z872 Personal history of diseases of the skin and subcutaneous tissue: Secondary | ICD-10-CM | POA: Diagnosis not present

## 2023-01-24 DIAGNOSIS — L821 Other seborrheic keratosis: Secondary | ICD-10-CM | POA: Diagnosis not present

## 2023-01-24 DIAGNOSIS — D2261 Melanocytic nevi of right upper limb, including shoulder: Secondary | ICD-10-CM | POA: Diagnosis not present

## 2023-02-05 DIAGNOSIS — E291 Testicular hypofunction: Secondary | ICD-10-CM | POA: Diagnosis not present

## 2023-02-07 DIAGNOSIS — E291 Testicular hypofunction: Secondary | ICD-10-CM | POA: Diagnosis not present

## 2023-02-07 DIAGNOSIS — M2550 Pain in unspecified joint: Secondary | ICD-10-CM | POA: Diagnosis not present

## 2023-02-07 DIAGNOSIS — G479 Sleep disorder, unspecified: Secondary | ICD-10-CM | POA: Diagnosis not present

## 2023-02-07 DIAGNOSIS — Z79890 Hormone replacement therapy (postmenopausal): Secondary | ICD-10-CM | POA: Diagnosis not present

## 2023-02-07 DIAGNOSIS — Z6822 Body mass index (BMI) 22.0-22.9, adult: Secondary | ICD-10-CM | POA: Diagnosis not present

## 2023-02-22 ENCOUNTER — Inpatient Hospital Stay: Payer: PPO | Attending: Internal Medicine

## 2023-02-22 DIAGNOSIS — R790 Abnormal level of blood mineral: Secondary | ICD-10-CM | POA: Diagnosis not present

## 2023-02-22 DIAGNOSIS — D696 Thrombocytopenia, unspecified: Secondary | ICD-10-CM | POA: Diagnosis not present

## 2023-02-22 LAB — IRON AND TIBC
Iron: 189 ug/dL — ABNORMAL HIGH (ref 45–182)
Saturation Ratios: 66 % — ABNORMAL HIGH (ref 17.9–39.5)
TIBC: 286 ug/dL (ref 250–450)
UIBC: 97 ug/dL

## 2023-02-22 LAB — FERRITIN: Ferritin: 593 ng/mL — ABNORMAL HIGH (ref 24–336)

## 2023-02-28 ENCOUNTER — Ambulatory Visit: Payer: PPO

## 2023-03-04 ENCOUNTER — Ambulatory Visit: Payer: PPO | Admitting: Internal Medicine

## 2023-03-22 ENCOUNTER — Telehealth: Payer: Self-pay | Admitting: *Deleted

## 2023-03-22 ENCOUNTER — Telehealth: Payer: Self-pay

## 2023-03-22 ENCOUNTER — Encounter: Payer: Self-pay | Admitting: Internal Medicine

## 2023-03-22 ENCOUNTER — Telehealth: Payer: Self-pay | Admitting: Internal Medicine

## 2023-03-22 ENCOUNTER — Ambulatory Visit: Payer: PPO | Admitting: Internal Medicine

## 2023-03-22 VITALS — BP 120/78 | HR 85 | Temp 97.5°F | Ht 72.0 in | Wt 162.8 lb

## 2023-03-22 DIAGNOSIS — F411 Generalized anxiety disorder: Secondary | ICD-10-CM | POA: Diagnosis not present

## 2023-03-22 DIAGNOSIS — E785 Hyperlipidemia, unspecified: Secondary | ICD-10-CM

## 2023-03-22 DIAGNOSIS — R7301 Impaired fasting glucose: Secondary | ICD-10-CM | POA: Diagnosis not present

## 2023-03-22 DIAGNOSIS — R161 Splenomegaly, not elsewhere classified: Secondary | ICD-10-CM | POA: Diagnosis not present

## 2023-03-22 DIAGNOSIS — K7460 Unspecified cirrhosis of liver: Secondary | ICD-10-CM

## 2023-03-22 DIAGNOSIS — E559 Vitamin D deficiency, unspecified: Secondary | ICD-10-CM

## 2023-03-22 DIAGNOSIS — M810 Age-related osteoporosis without current pathological fracture: Secondary | ICD-10-CM | POA: Diagnosis not present

## 2023-03-22 DIAGNOSIS — Z636 Dependent relative needing care at home: Secondary | ICD-10-CM | POA: Diagnosis not present

## 2023-03-22 LAB — LIPID PANEL
Cholesterol: 195 mg/dL (ref 0–200)
HDL: 108.6 mg/dL (ref >39.00)
LDL Cholesterol: 69 mg/dL (ref 0–99)
NonHDL: 86.07
Total CHOL/HDL Ratio: 2
Triglycerides: 85 mg/dL (ref 0.0–149.0)
VLDL: 17 mg/dL (ref 0.0–40.0)

## 2023-03-22 LAB — HEMOGLOBIN A1C: Hgb A1c MFr Bld: 4.7 % (ref 4.6–6.5)

## 2023-03-22 LAB — COMPREHENSIVE METABOLIC PANEL
ALT: 45 U/L (ref 0–53)
AST: 52 U/L — ABNORMAL HIGH (ref 0–37)
Albumin: 4.3 g/dL (ref 3.5–5.2)
Alkaline Phosphatase: 122 U/L — ABNORMAL HIGH (ref 39–117)
BUN: 19 mg/dL (ref 6–23)
CO2: 29 meq/L (ref 19–32)
Calcium: 10.4 mg/dL (ref 8.4–10.5)
Chloride: 100 meq/L (ref 96–112)
Creatinine, Ser: 1.16 mg/dL (ref 0.40–1.50)
GFR: 59.52 mL/min — ABNORMAL LOW (ref >60.00)
Glucose, Bld: 99 mg/dL (ref 70–99)
Potassium: 4.1 meq/L (ref 3.5–5.1)
Sodium: 143 meq/L (ref 135–145)
Total Bilirubin: 1.5 mg/dL — ABNORMAL HIGH (ref 0.2–1.2)
Total Protein: 6.8 g/dL (ref 6.0–8.3)

## 2023-03-22 LAB — LDL CHOLESTEROL, DIRECT: Direct LDL: 65 mg/dL

## 2023-03-22 LAB — VITAMIN D 25 HYDROXY (VIT D DEFICIENCY, FRACTURES): VITD: >120 ng/mL

## 2023-03-22 MED ORDER — SERTRALINE HCL 50 MG PO TABS
50.0000 mg | ORAL_TABLET | Freq: Every day | ORAL | 3 refills | Status: DC
Start: 2023-03-22 — End: 2023-07-16

## 2023-03-22 MED ORDER — DENOSUMAB 60 MG/ML ~~LOC~~ SOSY
60.0000 mg | PREFILLED_SYRINGE | Freq: Once | SUBCUTANEOUS | Status: AC
  Administered 2023-03-22: 60 mg via SUBCUTANEOUS

## 2023-03-22 NOTE — Telephone Encounter (Signed)
Left message for Patient to call the office back and sent a my chart message.

## 2023-03-22 NOTE — Telephone Encounter (Signed)
-----   Message from Sherlene Shams sent at 03/22/2023  4:39 PM EDT ----- 1) YOUR VITAMIN D IS CRITICALLY HIGH.  THIS CAN  CAUSE NEUROLOGIC DAMAGE.  STOP ALL SUPPLEMENTS CONTAINING VITAMIN D.  RECHECK IN ONE MONTH .  2) LIVER ENZYMES ARE ELEVATED,  REDUCE ALCOHOL TO ONE GLASS OF WINE DAILY

## 2023-03-22 NOTE — Assessment & Plan Note (Signed)
Current enzyme elevation consistent with alcoholic hepatitis. . Advised to abstain from alcohol, . Continue surveillance for hepatoma with ultrasound and AFP every 6 months.  Ultrasound and AFP ordered .  Lab Results  Component Value Date   IRON 189 (H) 02/22/2023   TIBC 286 02/22/2023   FERRITIN 593 (H) 02/22/2023

## 2023-03-22 NOTE — Telephone Encounter (Signed)
CRITICAL VALUE STICKER  CRITICAL VALUE: Vit D >120  RECEIVER (on-site recipient of call): Silvestre Moment, CMA  DATE & TIME NOTIFIED: 03/22/23 @ 3:15pm  MESSENGER (representative from lab): Hope  MD NOTIFIED: Dr. Darrick Huntsman  TIME OF NOTIFICATION: 3:17pm  RESPONSE:

## 2023-03-22 NOTE — Assessment & Plan Note (Signed)
Ferritin was high last week based on review of outsie [employer]) y labs:  Based on current increased iron stores his  TSAT is 59%, (in cirrhosis TSAT should be < 45%) .  Reviewed  recent hematology evaluation ; repeat levels did not meet threshhold for intervention and etiology is likely cirrhosis.    Lab Results  Component Value Date   IRON 189 (H) 02/22/2023   TIBC 286 02/22/2023   FERRITIN 593 (H) 02/22/2023   Lab Results  Component Value Date   ALT 27 07/02/2022   AST 35 07/02/2022   ALKPHOS 86 07/02/2022   BILITOT 1.0 07/02/2022

## 2023-03-22 NOTE — Progress Notes (Unsigned)
Subjective:  Patient ID: Tony Luna, male    DOB: 01-23-1941  Age: 82 y.o. MRN: [id number]  CC: The primary encounter diagnosis was Hyperlipidemia, unspecified hyperlipidemia type. Diagnoses of Impaired fasting glucose, Mild vitamin D deficiency, Iron overload, Enlargement of spleen, and Cirrhosis of liver without ascites, unspecified hepatic cirrhosis type (HCC) were also pertinent to this visit.   HPI Tony Luna presents for  Chief Complaint  Patient presents with   Medical Management of Chronic Issues   Stress:  wife's dementia and breast cancer.   "Real estate "tangle"  has lost 10 lbs since februrayr but seems to be intentional , likes to keep weight  at 160  Not sleeping well   Outpatient Medications Prior to Visit  Medication Sig Dispense Refill   anastrozole (ARIMIDEX) 1 MG tablet Take 1 mg by mouth once a week.     Boron 6 MG TABS Take by mouth daily.     clonazepam (KLONOPIN) 0.125 MG disintegrating tablet Take 1 tablet (0.125 mg total) by mouth daily. In the morning as needed for anxiety 30 tablet 0   doxycycline (VIBRA-TABS) 100 MG tablet Take 1 tablet (100 mg total) by mouth 2 (two) times daily. 56 tablet 2   Multiple Vitamin (MULTIVITAMIN WITH MINERALS) TABS tablet Take 1 tablet by mouth 2 (two) times daily.     NP THYROID 60 MG tablet TAKE ONE TABLET BY MOUTH EVERY DAY 90 tablet 1   OVER THE COUNTER MEDICATION Take 1 capsule by mouth 3 (three) times daily. Instaflex     OVER THE COUNTER MEDICATION Take 250 mg by mouth daily. Bio Magnesium Citrate     OVER THE COUNTER MEDICATION Take 20,000 mcg by mouth daily. Vitamin B12-methylfolate     OVER THE COUNTER MEDICATION Take 800 mcg by mouth daily. Active B Folate     OVER THE COUNTER MEDICATION 2 tablets daily. VitaPrime     OVER THE COUNTER MEDICATION 1,000 mg daily. Super Bio-C buffered     OVER THE COUNTER MEDICATION 1,000 mg daily. L'Arginine     Saw Palmetto 160 MG TABS daily in the afternoon.     sildenafil  (VIAGRA) 50 MG tablet USE AS DIRECTED 90 tablet 0   tadalafil (CIALIS) 20 MG tablet TAKE ONE TABLET DAILY AS NEEDED FOR ERECTILE DYSFUNCTION 240 tablet 0   tamsulosin (FLOMAX) 0.4 MG CAPS capsule TAKE 1 CAPSULE BY MOUTH ONCE DAILY 90 capsule 1   Testosterone 75 MG PLLT by Implant route.     traMADol (ULTRAM) 50 MG tablet TAKE ONE TABLET EVERY 6 HOURS AS NEEDED FOR PAIN 28 tablet 0   zolpidem (AMBIEN CR) 6.25 MG CR tablet Take 1 tablet (6.25 mg total) by mouth at bedtime as needed. for sleep 90 tablet 0   No facility-administered medications prior to visit.    Review of Systems;  Patient denies headache, fevers, malaise, unintentional weight loss, skin rash, eye pain, sinus congestion and sinus pain, sore throat, dysphagia,  hemoptysis , cough, dyspnea, wheezing, chest pain, palpitations, orthopnea, edema, abdominal pain, nausea, melena, diarrhea, constipation, flank pain, dysuria, hematuria, urinary  Frequency, nocturia, numbness, tingling, seizures,  Focal weakness, Loss of consciousness,  Tremor, insomnia, depression, anxiety, and suicidal ideation.      Objective:  BP 120/78   Pulse 85   Temp (!) 97.5 F (36.4 C)   Ht 6' (1.829 m)   Wt 162 lb 12.8 oz (73.8 kg)   SpO2 98%   BMI 22.08 kg/m  BP Readings from Last 3 Encounters:  03/22/23 120/78  01/21/23 112/76  07/23/22 (!) 148/90    Wt Readings from Last 3 Encounters:  03/22/23 162 lb 12.8 oz (73.8 kg)  01/21/23 163 lb (73.9 kg)  07/23/22 173 lb (78.5 kg)    Physical Exam  Lab Results  Component Value Date   HGBA1C 4.9 07/02/2022   HGBA1C 5.1 07/31/2021   HGBA1C 5.0 04/30/2019    Lab Results  Component Value Date   CREATININE 1.19 07/02/2022   CREATININE 1.13 02/02/2022   CREATININE 1.13 07/31/2021    Lab Results  Component Value Date   WBC 3.8 (L) 01/21/2023   HGB 13.5 01/21/2023   HCT 39.1 01/21/2023   PLT 119 (L) 01/21/2023   GLUCOSE 107 (H) 07/02/2022   CHOL 218 (H) 07/02/2022   TRIG 47.0  07/02/2022   HDL 117.20 07/02/2022   LDLDIRECT 75.0 07/02/2022   LDLCALC 92 07/02/2022   ALT 27 07/02/2022   AST 35 07/02/2022   NA 142 07/02/2022   K 4.0 07/02/2022   CL 102 07/02/2022   CREATININE 1.19 07/02/2022   BUN 14 07/02/2022   CO2 28 07/02/2022   TSH 2.54 07/02/2022   PSA 1.65 07/02/2022   INR 1.0 11/04/2019   HGBA1C 4.9 07/02/2022   MICROALBUR 5.8 (H) 07/02/2022    No results found.  Assessment & Plan:  .Hyperlipidemia, unspecified hyperlipidemia type -     Comprehensive metabolic panel -     Lipid panel -     LDL cholesterol, direct  Impaired fasting glucose -     Comprehensive metabolic panel -     Hemoglobin A1c  Mild vitamin D deficiency -     VITAMIN D 25 Hydroxy (Vit-D Deficiency, Fractures)  Iron overload Assessment & Plan: Ferritin was high last week based on review of outsie ([employer] [place] y labs:  Based on current increased iron stores his  TSAT is 59%, (in cirrhosis TSAT should be < 45%) .  Reviewed  recent hematology evaluation ; repeat levels did not meet threshhold for intervention and etiology is likely cirrhosis.    Lab Results  Component Value Date   IRON 189 (H) 02/22/2023   TIBC 286 02/22/2023   FERRITIN 593 (H) 02/22/2023   Lab Results  Component Value Date   ALT 27 07/02/2022   AST 35 07/02/2022   ALKPHOS 86 07/02/2022   BILITOT 1.0 07/02/2022      Enlargement of spleen Assessment & Plan: Secondary to cirrhosis    Cirrhosis of liver without ascites, unspecified hepatic cirrhosis type [facility]) Assessment & Plan: Advised to reduce alcohol use. Continue surveillance for hepatoma with ultrasound and AFP every 6 months.  Ultrasound and AFP ordered .  Lab Results  Component Value Date   IRON 189 (H) 02/22/2023   TIBC 286 02/22/2023   FERRITIN 593 (H) 02/22/2023    Orders: -     AFP tumor marker -     US ABDOMEN LIMITED RUQ (LIVER/GB); Future     I provided 30 minutes of face-to-face time during this encounter reviewing  patient's last visit with me, patient's  most recent visit with cardiology,  nephrology,  and neurology,  recent surgical and non surgical procedures, previous  labs and imaging studies, counseling on currently addressed issues,  and post visit ordering to diagnostics and therapeutics .   Follow-up: No follow-ups on file.   Sherlene Shams, MD

## 2023-03-22 NOTE — Patient Instructions (Addendum)
You are overdue for the ultrasound of your liver , we should do this every 6 months   You might want to try using Relaxium for insomnia  (as seen on TV commercials) . It is available through [employer] and contains all natural supplement Melatonin 5 mg  Chamomile 25 mg Passionflower extract 75 mg GABA 100 mg Ashwaganda extract 125 mg Magnesium citrate, glycinate, oxide (100 mg)  L tryptophan 500 mg Valerest (proprietary  ingredient ; probably valeria root extract)    Please start the  Sertraline (generic for Zoloft) at 1/2 tablet daily in the morning with breakfast for the first week to avoid nausea.  You can increase to a full tablet after 1 week if you havenot developed side effects of nausea.    You should start to eel a difference after two weeks on the full dose

## 2023-03-22 NOTE — Telephone Encounter (Signed)
Lft pt vm to call ofc to sch US. thanks ?

## 2023-03-22 NOTE — Assessment & Plan Note (Signed)
Secondary to cirrhosis

## 2023-03-23 ENCOUNTER — Encounter: Payer: Self-pay | Admitting: Internal Medicine

## 2023-03-24 NOTE — Assessment & Plan Note (Signed)
Starting sertraline for anxiety  given continued use of alcohol making higher doses of clonazepam problematic and unsafe

## 2023-03-25 LAB — IRON,TIBC AND FERRITIN PANEL
%SAT: 68 % — ABNORMAL HIGH (ref 20–48)
Ferritin: 816 ng/mL — ABNORMAL HIGH (ref 24–380)
Iron: 184 ug/dL — ABNORMAL HIGH (ref 50–180)
TIBC: 272 ug/dL (ref 250–425)

## 2023-03-25 LAB — AFP TUMOR MARKER: AFP-Tumor Marker: 2.6 ng/mL (ref <6.1)

## 2023-03-25 NOTE — Telephone Encounter (Signed)
Noted  

## 2023-03-25 NOTE — Telephone Encounter (Signed)
LMTCB

## 2023-03-25 NOTE — Telephone Encounter (Signed)
Pt seen and responded to his mychart message.

## 2023-03-28 ENCOUNTER — Ambulatory Visit
Admission: RE | Admit: 2023-03-28 | Discharge: 2023-03-28 | Disposition: A | Discharge status: Home / Self Care | Payer: PPO | Source: Ambulatory Visit | Attending: Internal Medicine | Admitting: Internal Medicine

## 2023-03-28 DIAGNOSIS — K7460 Unspecified cirrhosis of liver: Secondary | ICD-10-CM | POA: Insufficient documentation

## 2023-04-08 ENCOUNTER — Other Ambulatory Visit: Payer: Self-pay | Admitting: *Deleted

## 2023-04-08 DIAGNOSIS — N401 Enlarged prostate with lower urinary tract symptoms: Secondary | ICD-10-CM

## 2023-04-08 DIAGNOSIS — N200 Calculus of kidney: Secondary | ICD-10-CM

## 2023-04-12 ENCOUNTER — Other Ambulatory Visit: Payer: Self-pay | Admitting: Internal Medicine

## 2023-04-24 ENCOUNTER — Ambulatory Visit: Payer: PPO | Admitting: Urology

## 2023-04-24 ENCOUNTER — Ambulatory Visit
Admission: RE | Admit: 2023-04-24 | Discharge: 2023-04-24 | Disposition: A | Discharge status: Home / Self Care | Payer: PPO | Source: Ambulatory Visit | Attending: Urology | Admitting: Urology

## 2023-04-24 ENCOUNTER — Ambulatory Visit
Admission: RE | Admit: 2023-04-24 | Discharge: 2023-04-24 | Disposition: A | Discharge status: Home / Self Care | Payer: PPO | Attending: Urology | Admitting: Urology

## 2023-04-24 VITALS — BP 157/73 | HR 111 | Ht 72.0 in | Wt 160.0 lb

## 2023-04-24 DIAGNOSIS — N200 Calculus of kidney: Secondary | ICD-10-CM | POA: Insufficient documentation

## 2023-04-24 DIAGNOSIS — E291 Testicular hypofunction: Secondary | ICD-10-CM | POA: Diagnosis not present

## 2023-04-24 DIAGNOSIS — N401 Enlarged prostate with lower urinary tract symptoms: Secondary | ICD-10-CM

## 2023-04-24 NOTE — Progress Notes (Signed)
I, Tony Luna, acting as a scribe for Riki Altes, MD., have documented all relevant documentation on the behalf of Riki Altes, MD, as directed by Riki Altes, MD while in the presence of Riki Altes, MD.  04/24/2023 12:27 PM   Tony Luna 12, 1942 [id number]  Referring provider: Sherlene Shams, MD [address] [place],  [place] [address]  Chief Complaint  Patient presents with   Benign Prostatic Hypertrophy   Urologic history: 1. BPH with LUTS Tamsulosin 0.4 mg daily   2. Erectile dysfunction PDE 5 inhibitor used intermittently   3. Hypogonadism TRT with subcu testosterone pellets Managed by [facility] in [place]   4. Microhematuria CTU 05/2021: 3 mm left renal calculus, BPH Cystoscopy 06/2021: Moderate lateral lobe enlargement with hypervascularity/prostatic calculi  HPI: Tony Luna is a 82 y.o. male presents for follow-up visit.   Since he has noted mild increased frequency but not bothersome.  Denies dysuria, gross hematuria Denies flank, abdominal or pelvic pain Continues with TRT followed in [place] He has labs drawn in [place] and will forward his latest results.   PMH: Past Medical History:  Diagnosis Date   Allergy    Arthritis    Cirrhosis (HCC)    Complication of anesthesia    :"need catheter"   Dysrhythmia    ? something told by reg dr  unable to say what it was   Esophageal tear    Hypothyroidism    Pneumonia    hx   Primary osteoarthritis of left knee 06/22/2014   Rotator cuff tear     Surgical History: Past Surgical History:  Procedure Laterality Date   BACK SURGERY     blepharaplasty     CATARACT EXTRACTION W/PHACO Left 02/21/2021   Procedure: CATARACT EXTRACTION PHACO AND INTRAOCULAR LENS PLACEMENT (IOC) LEFT 4.56 00:34.9;  Surgeon: Galen Manila, MD;  Location: [facility];  Service: Ophthalmology;  Laterality: Left;   CAUTERY OF TURBINATES     COLONOSCOPY WITH  PROPOFOL N/A 07/19/2015   Procedure: COLONOSCOPY WITH PROPOFOL;  Surgeon: Midge Minium, MD;  Location: [facility] ENDOSCOPY;  Service: Endoscopy;  Laterality: N/A;   COLONOSCOPY WITH PROPOFOL N/A 03/21/2021   Procedure: COLONOSCOPY WITH PROPOFOL;  Surgeon: Midge Minium, MD;  Location: [facility] ENDOSCOPY;  Service: Endoscopy;  Laterality: N/A;   COLONOSCOPY WITH PROPOFOL N/A 04/18/2021   Procedure: COLONOSCOPY WITH PROPOFOL;  Surgeon: Midge Minium, MD;  Location: [facility] ENDOSCOPY;  Service: Endoscopy;  Laterality: N/A;   COLONOSCOPY WITH PROPOFOL N/A 04/24/2022   Procedure: COLONOSCOPY WITH PROPOFOL;  Surgeon: Midge Minium, MD;  Location: [facility] ENDOSCOPY;  Service: Endoscopy;  Laterality: N/A;   EYE SURGERY     HERNIA REPAIR Left    inguinal herniorrhapy   KNEE SURGERY     ? loose body/ chondral defect 33yrs   ROTATOR CUFF REPAIR     left   SPINE SURGERY     TONSILLECTOMY     TOTAL KNEE ARTHROPLASTY Left 06/22/2014   Procedure: TOTAL KNEE ARTHROPLASTY;  Surgeon: Eulas Post, MD;  Location: [facility] OR;  Service: Orthopedics;  Laterality: Left;    Home Medications:  Allergies as of 04/24/2023   No Known Allergies      Medication List        Accurate as of April 24, 2023 12:27 PM. If you have any questions, ask your nurse or doctor.          anastrozole 1 MG tablet Commonly known  as: ARIMIDEX Take 1 mg by mouth once a week.   Boron 6 MG Tabs Take by mouth daily.   clonazepam 0.125 MG disintegrating tablet Commonly known as: KLONOPIN Take 1 tablet (0.125 mg total) by mouth daily. In the morning as needed for anxiety   doxycycline 100 MG tablet Commonly known as: VIBRA-TABS Take 1 tablet (100 mg total) by mouth 2 (two) times daily.   multivitamin with minerals Tabs tablet Take 1 tablet by mouth 2 (two) times daily.   NP Thyroid 60 MG tablet Generic drug: thyroid TAKE ONE TABLET BY MOUTH EVERY DAY   OVER THE COUNTER MEDICATION Take 250 mg by mouth daily. Bio Magnesium  Citrate   OVER THE COUNTER MEDICATION Take 20,000 mcg by mouth daily. Vitamin B12-methylfolate   OVER THE COUNTER MEDICATION Take 800 mcg by mouth daily. Active B Folate   OVER THE COUNTER MEDICATION 2 tablets daily. VitaPrime   OVER THE COUNTER MEDICATION Take 1 capsule by mouth 3 (three) times daily. Instaflex   OVER THE COUNTER MEDICATION 1,000 mg daily. Super Bio-C buffered   OVER THE COUNTER MEDICATION 1,000 mg daily. L'Arginine   Saw Palmetto 160 MG Tabs daily in the afternoon.   sertraline 50 MG tablet Commonly known as: ZOLOFT Take 1 tablet (50 mg total) by mouth daily.   sildenafil 50 MG tablet Commonly known as: VIAGRA USE AS DIRECTED   tadalafil 20 MG tablet Commonly known as: CIALIS TAKE ONE TABLET DAILY AS NEEDED FOR ERECTILE DYSFUNCTION   tamsulosin 0.4 MG Caps capsule Commonly known as: FLOMAX TAKE 1 CAPSULE BY MOUTH ONCE DAILY   Testosterone 75 MG Pllt by Implant route.   traMADol 50 MG tablet Commonly known as: ULTRAM TAKE ONE TABLET EVERY 6 HOURS AS NEEDED FOR PAIN   zolpidem 6.25 MG CR tablet Commonly known as: AMBIEN CR Take 1 tablet (6.25 mg total) by mouth at bedtime as needed. for sleep        Allergies: No Known Allergies  Family History: Family History  Problem Relation Age of Onset   Alcohol abuse Mother    Diabetes Mother    Mental retardation Mother     Social History:  reports that he quit smoking about 39 years ago. His smoking use included cigarettes. He started smoking about 54 years ago. He has a 15 pack-year smoking history. His smokeless tobacco use includes chew. He reports current alcohol use of about 10.0 standard drinks of alcohol per week. He reports that he does not use drugs.   Physical Exam: BP (!) 157/73   Pulse (!) 111   Ht 6' (1.829 m)   Wt 160 lb (72.6 kg)   BMI 21.70 kg/m   Constitutional:  Alert and oriented, No acute distress. HEENT: [place] AT Respiratory: Normal respiratory effort, no increased  work of breathing. Psychiatric: Normal mood and affect.   Assessment & Plan:    1. BPH with LUTS Stable Continue tamsulosin.   2. Hypogonadism Stable Administered/monitored in [place]  3. Nephrolithiasis KUB performed today showed a moderate amount of stool and bowel gas overlying the renal outlines in the 3mm left renal calculus was not identified. Continue annual follow-up.   I have reviewed the above documentation for accuracy and completeness, and I agree with the above.   Riki Altes, MD  [facility] [address], [address] [place], [place] [address] [phone]

## 2023-04-26 ENCOUNTER — Encounter: Payer: Self-pay | Admitting: Urology

## 2023-05-03 ENCOUNTER — Encounter: Payer: Self-pay | Admitting: Cardiology

## 2023-05-03 ENCOUNTER — Ambulatory Visit: Payer: PPO | Attending: Cardiology | Admitting: Cardiology

## 2023-05-03 VITALS — BP 156/82 | HR 96 | Ht 72.0 in | Wt 162.2 lb

## 2023-05-03 DIAGNOSIS — R030 Elevated blood-pressure reading, without diagnosis of hypertension: Secondary | ICD-10-CM

## 2023-05-03 DIAGNOSIS — I351 Nonrheumatic aortic (valve) insufficiency: Secondary | ICD-10-CM

## 2023-05-03 NOTE — Patient Instructions (Signed)

## 2023-05-03 NOTE — Progress Notes (Signed)
Cardiology Office Note:    Date:  05/03/2023   ID:  Tony Luna, DOB 27-Nov-1940, MRN [id number]  PCP:  Sherlene Shams, MD  [facility] Cardiologist:  Debbe Odea, MD  [facility] Electrophysiologist:  None   Referring MD: Sherlene Shams, MD   Chief Complaint  Patient presents with   Follow-up    Patient denies new or acute cardiac problems/concerns today.      History of Present Illness:    Tony Luna is a 82 y.o. male with a hx of mild aortic regurgitation, hypothyroidism who presents for follow-up.  Feels well, denies chest pain or shortness of breath, exercises frequently at the gym with no adverse effects.  States having some stressors at home which may be causing his BP to be elevated today.  Wife has Alzheimer's.  Prior notes Echo 03/2020 EF 55 to 60%, aortic valve sclerosis, mild aortic regurgitation.  had a recent fall, with right MCL tear.  Underwent surgery and physical therapy, with good results.  Trying to get back to exercising on his elliptical.  Denies any chest pain or shortness of breath with exercising.  Past Medical History:  Diagnosis Date   Allergy    Arthritis    Cirrhosis (HCC)    Complication of anesthesia    :"need catheter"   Dysrhythmia    ? something told by reg dr  unable to say what it was   Esophageal tear    Hypothyroidism    Pneumonia    hx   Primary osteoarthritis of left knee 06/22/2014   Rotator cuff tear     Past Surgical History:  Procedure Laterality Date   BACK SURGERY     blepharaplasty     CATARACT EXTRACTION W/PHACO Left 02/21/2021   Procedure: CATARACT EXTRACTION PHACO AND INTRAOCULAR LENS PLACEMENT (IOC) LEFT 4.56 00:34.9;  Surgeon: Galen Manila, MD;  Location: [facility];  Service: Ophthalmology;  Laterality: Left;   CAUTERY OF TURBINATES     COLONOSCOPY WITH PROPOFOL N/A 07/19/2015   Procedure: COLONOSCOPY WITH PROPOFOL;  Surgeon: Midge Minium, MD;  Location: [facility] ENDOSCOPY;  Service:  Endoscopy;  Laterality: N/A;   COLONOSCOPY WITH PROPOFOL N/A 03/21/2021   Procedure: COLONOSCOPY WITH PROPOFOL;  Surgeon: Midge Minium, MD;  Location: [facility] ENDOSCOPY;  Service: Endoscopy;  Laterality: N/A;   COLONOSCOPY WITH PROPOFOL N/A 04/18/2021   Procedure: COLONOSCOPY WITH PROPOFOL;  Surgeon: Midge Minium, MD;  Location: [facility] ENDOSCOPY;  Service: Endoscopy;  Laterality: N/A;   COLONOSCOPY WITH PROPOFOL N/A 04/24/2022   Procedure: COLONOSCOPY WITH PROPOFOL;  Surgeon: Midge Minium, MD;  Location: [facility] ENDOSCOPY;  Service: Endoscopy;  Laterality: N/A;   EYE SURGERY     HERNIA REPAIR Left    inguinal herniorrhapy   KNEE SURGERY     ? 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   Boron 6 MG TABS Take by mouth daily.   clonazepam (KLONOPIN) 0.125 MG disintegrating tablet Take 1 tablet (0.125 mg total) by mouth daily. In the morning as needed for anxiety   doxycycline (VIBRA-TABS) 100 MG tablet  Take 1 tablet (100 mg total) by mouth 2 (two) times daily.   Multiple Vitamin (MULTIVITAMIN WITH MINERALS) TABS tablet Take 1 tablet by mouth 2 (two) times daily.   NP THYROID 60 MG tablet TAKE ONE TABLET BY MOUTH EVERY DAY   OVER THE COUNTER MEDICATION Take 1 capsule by mouth 3 (three) times daily. Instaflex   OVER THE COUNTER MEDICATION Take 250 mg by mouth daily. Bio Magnesium Citrate   OVER THE COUNTER MEDICATION Take 20,000 mcg by mouth daily. Vitamin B12-methylfolate   OVER THE COUNTER MEDICATION Take 800 mcg by mouth daily. Active B Folate   OVER THE COUNTER MEDICATION 2 tablets daily. VitaPrime   OVER THE COUNTER MEDICATION 1,000 mg daily.  Super Bio-C buffered   OVER THE COUNTER MEDICATION 1,000 mg daily. L'Arginine   Saw Palmetto 160 MG TABS daily in the afternoon.   sertraline (ZOLOFT) 50 MG tablet Take 1 tablet (50 mg total) by mouth daily.   sildenafil (VIAGRA) 50 MG tablet USE AS DIRECTED   tadalafil (CIALIS) 20 MG tablet TAKE ONE TABLET DAILY AS NEEDED FOR ERECTILE DYSFUNCTION   tamsulosin (FLOMAX) 0.4 MG CAPS capsule TAKE 1 CAPSULE BY MOUTH ONCE DAILY   Testosterone 75 MG PLLT by Implant route.   traMADol (ULTRAM) 50 MG tablet TAKE ONE TABLET EVERY 6 HOURS AS NEEDED FOR PAIN   zolpidem (AMBIEN CR) 6.25 MG CR tablet Take 1 tablet (6.25 mg total) by mouth at bedtime as needed. for sleep     Allergies:   Patient has no known allergies.   Social History   Socioeconomic History   Marital status: Married    Spouse name: Not on file   Number of children: 2   Years of education: Not on file   Highest education level: Not on file  Occupational History   Occupation: textiles  Tobacco Use   Smoking status: Former    Current packs/day: 0.00    Average packs/day: 1 pack/day for 15.0 years (15.0 ttl pk-yrs)    Types: Cigarettes    Start date: 06/14/1968    Quit date: 06/15/1983    Years since quitting: 39.9   Smokeless tobacco: Current    Types: Chew  Vaping Use   Vaping status: Never Used  Substance and Sexual Activity   Alcohol use: Yes    Alcohol/week: 10.0 standard drinks of alcohol    Types: 10 Glasses of wine per week   Drug use: No   Sexual activity: Not Currently  Other Topics Concern   Not on file  Social History Narrative   [employer] graduate   Avid hunter   Very active   Walks 1 hour daily   Social Determinants of Health   Financial Resource Strain: Low Risk  (07/02/2022)   Overall Financial Resource Strain (CARDIA)    Difficulty of Paying Living Expenses: Not hard at all  Food Insecurity: No Food Insecurity (07/02/2022)   Hunger Vital Sign    Worried About Running Out of Food in the Last Year: Never  true    Ran Out of Food in the Last Year: Never true  Transportation Needs: No Transportation Needs (07/02/2022)   PRAPARE - Administrator, Civil Service (Medical): No    Lack of Transportation (Non-Medical): No  Physical Activity: Sufficiently Active (07/02/2022)   Exercise Vital Sign    Days of Exercise per Week: 4 days    Minutes of Exercise per Session: 60 min  Stress: No Stress Concern Present (07/02/2022)  [employer] of Occupational Health - Occupational Stress Questionnaire    Feeling of Stress : Not at all  Social Connections: Unknown (07/02/2022)   Social Connection and Isolation Panel [NHANES]    Frequency of Communication with Friends and Family: More than three times a week    Frequency of Social Gatherings with Friends and Family: Twice a week    Attends Religious Services: Not on Marketing executive or Organizations: Yes    Attends Engineer, structural: More than 4 times per year    Marital Status: Married     Family History: The patient's family history includes Alcohol abuse in his mother; Diabetes in his mother; Mental retardation in his mother.  ROS:   Please see the history of present illness.     All other systems reviewed and are negative.  EKGs/Labs/Other Studies Reviewed:    The following studies were reviewed today:   EKG Interpretation Date/Time:  Friday May 03 2023 10:43:45 EST Ventricular Rate:  96 PR Interval:  150 QRS Duration:  78 QT Interval:  342 QTC Calculation: 432 R Axis:   47  Text Interpretation: Sinus rhythm with marked sinus arrhythmia with occasional Premature ventricular complexes Nonspecific ST abnormality Confirmed by Debbe Odea ([id number]) on 05/03/2023 10:44:56 AM    Recent Labs: 07/02/2022: TSH 2.54 01/21/2023: Hemoglobin 13.5; Platelet Count 119 03/22/2023: ALT 45; BUN 19; Creatinine, Ser 1.16; Potassium 4.1; Sodium 143  Recent Lipid Panel    Component Value Date/Time   CHOL 195  03/22/2023 1048   TRIG 85.0 03/22/2023 1048   HDL 108.60 03/22/2023 1048   CHOLHDL 2 03/22/2023 1048   VLDL 17.0 03/22/2023 1048   LDLCALC 69 03/22/2023 1048   LDLDIRECT 65.0 03/22/2023 1048     Risk Assessment/Calculations:      Physical Exam:    VS:  BP (!) 156/82 (BP Location: Left Arm, Patient Position: Sitting, Cuff Size: Normal)   Pulse 96   Ht 6' (1.829 m)   Wt 162 lb 3.2 oz (73.6 kg)   SpO2 98%   BMI 22.00 kg/m     Wt Readings from Last 3 Encounters:  05/03/23 162 lb 3.2 oz (73.6 kg)  04/24/23 160 lb (72.6 kg)  03/22/23 162 lb 12.8 oz (73.8 kg)     GEN:  Well nourished, well developed in no acute distress HEENT: Normal NECK: No JVD; No carotid bruits CARDIAC: RRR, 2/6 systolic murmur, right sternal border RESPIRATORY:  Clear to auscultation without rales, wheezing or rhonchi  ABDOMEN: Soft, non-tender, non-distended MUSCULOSKELETAL:  No edema; No deformity  SKIN: Warm and dry NEUROLOGIC:  Alert and oriented x 3 PSYCHIATRIC:  Normal affect   ASSESSMENT:    1. Aortic valve insufficiency, etiology of cardiac valve disease unspecified   2. Elevated BP without diagnosis of hypertension    PLAN:    In order of problems listed above:  Mild aortic regurgitation, repeat echocardiogram 09/2022 showed mild aortic regurgitation, aortic valve calcification with no significant stenosis.  EF 55 to 60%.  No significant change from prior in 11/21. Plan serial monitoring, repeat echo in about 3 years/2027. Elevated BP without diagnosis of hypertension, patient has a component of whitecoat syndrome.  Continue to monitor off BP meds.  BP normal at home, also dealing with some stressors at home currently.  Follow-up in 12 months.   Shared Decision Making/Informed Consent      Medication Adjustments/Labs and Tests Ordered: Current medicines are reviewed at length with the  patient today.  Concerns regarding medicines are outlined above.  Orders Placed This Encounter   Procedures   EKG 12-Lead   No orders of the defined types were placed in this encounter.   Patient Instructions  Medication Instructions:   Your physician recommends that you continue on your current medications as directed. Please refer to the Current Medication list given to you today.  *If you need a refill on your cardiac medications before your next appointment, please call your pharmacy*   Lab Work:  None Ordered  If you have labs (blood work) drawn today and your tests are completely normal, you will receive your results only by: MyChart Message (if you have MyChart) OR A paper copy in the mail If you have any lab test that is abnormal or we need to change your treatment, we will call you to review the results.   Testing/Procedures:  None Ordered   Follow-Up: At [facility], you and your health needs are our priority.  As part of our continuing mission to provide you with exceptional heart care, we have created designated Provider Care Teams.  These Care Teams include your primary Cardiologist (physician) and Advanced Practice Providers (APPs -  Physician Assistants and Nurse Practitioners) who all work together to provide you with the care you need, when you need it.  We recommend signing up for the patient portal called "MyChart".  Sign up information is provided on this After Visit Summary.  MyChart is used to connect with patients for Virtual Visits (Telemedicine).  Patients are able to view lab/test results, encounter notes, upcoming appointments, etc.  Non-urgent messages can be sent to your provider as well.   To learn more about what you can do with MyChart, go to [email or web address].    Your next appointment:   12 month(s)  Provider:   You may see Debbe Odea, MD or one of the following Advanced Practice Providers on your designated Care Team:   Nicolasa Ducking, NP Eula Listen, PA-C Cadence Fransico Michael, PA-C Charlsie Quest, NP Carlos Levering, NP   Signed, Debbe Odea, MD  05/03/2023 11:16 AM    [facility]

## 2023-05-07 DIAGNOSIS — Z7689 Persons encountering health services in other specified circumstances: Secondary | ICD-10-CM | POA: Diagnosis not present

## 2023-05-17 ENCOUNTER — Ambulatory Visit: Payer: Self-pay | Admitting: Internal Medicine

## 2023-05-17 ENCOUNTER — Telehealth: Payer: Self-pay | Admitting: Internal Medicine

## 2023-05-17 NOTE — Telephone Encounter (Signed)
error 

## 2023-05-17 NOTE — Telephone Encounter (Signed)
Outbound call to Tony Luna to further assess a reported head injury in his kitchen a couple days ago. He states that he has experienced dizziness and is off balance since he hit his head. No other assessment information obtained.   Message left asking Tony Luna to call Dr Melina Schools office back to speak with a nurse for assistance with his reason for call this morning.   Will continue attempts to reach patient.  Noted that the appt scheduled for 12/23 has been cancelled.

## 2023-05-17 NOTE — Telephone Encounter (Signed)
Called [place] police for well check due not being able to reach patient deputy will call back once patient has been contacted. Please if call back from patient triage.

## 2023-05-17 NOTE — Telephone Encounter (Signed)
  Chief Complaint:Head injury Symptoms: hit back of head on corner of kitchen table along with some dizziness-occurred on Wednesday. Not having dizziness currently Pertinent Negatives: Patient denies dizziness Disposition: [] ED /[] Urgent Care (no appt availability in office) / [x] Appointment(In office/virtual)/ []  [facility]/ [] Home Care/ [] Refused Recommended Disposition /[] [facility]/ []  Follow-up with PCP Additional Notes: patient called back after several calls were made about his head injury. Patient states the injury happened on Wednesday. States very little blood from the cut. Was given an appointment on Monday 05/20/2023. Patient states that he is currently feeling fine. Given care advise. Verbalizes understanding of plan of care. All questions answered.   Copied from CRM [id number]. Topic: Appointments - Appointment Scheduling >> May 17, 2023  9:00 AM Samuel Jester B wrote: Patient/patient representative is calling to schedule an appointment. Refer to attachments for appointment information.  Reason for Disposition  Small cut (scratch) or abrasion (scrape) is also present  Answer Assessment - Initial Assessment Questions 1. MECHANISM: "How did the injury happen?" For falls, ask: "What height did you fall from?" and "What surface did you fall against?"      Pt hit his head after dropping his car keys under the kitchen table and cut his head on the corner of the table 2. ONSET: "When did the injury happen?" (Minutes or hours ago)      Happened a couple of day ago 3. NEUROLOGIC SYMPTOMS: "Was there any loss of consciousness?" "Are there any other neurological symptoms?"      No LOC 4. MENTAL STATUS: "Does the person know who they are, who you are, and where they are?"      Patient knows where he is, who he is and where he is 5. LOCATION: "What part of the head was hit?"      Hit the back of his head 6. SCALP APPEARANCE: "What does the scalp look like? Is it bleeding now?"  If Yes, ask: "Is it difficult to stop?"      Scabbed over according to patient 7. SIZE: For cuts, bruises, or swelling, ask: "How large is it?" (e.g., inches or centimeters)      Very small 8. PAIN: "Is there any pain?" If Yes, ask: "How bad is it?"  (e.g., Scale 1-10; or mild, moderate, severe)     No pain 10. OTHER SYMPTOMS: "Do you have any other symptoms?" (e.g., neck pain, vomiting)       no  Protocols used: Head Injury-A-AH

## 2023-05-17 NOTE — Telephone Encounter (Signed)
E2C2 scheduled patient on Monday with P Webb. She will not be at our office. Per appointment note he hit his head and is dizzy.

## 2023-05-17 NOTE — Telephone Encounter (Signed)
Attempted to call Patient- no answer . Left message to call back.

## 2023-05-17 NOTE — Telephone Encounter (Signed)
Answer Assessment - Initial Assessment Questions N/A This RN attempted to contact pt for triage unsuccessfully. Multiple attempts made, routing to office for review.  Protocols used: No Contact or Duplicate Contact Call-A-AH

## 2023-05-17 NOTE — Telephone Encounter (Addendum)
This RN attempted to contact pt wife, Bosie Clos at [phone], let voicemail requesting a call back to office.  This RN attempted to contact patient for triage at [phone] and [phone], no answer. Voicemail left on both lines requesting return call.

## 2023-05-17 NOTE — Telephone Encounter (Signed)
Pt is scheduled to see Worthy Rancher on Monday.

## 2023-05-20 ENCOUNTER — Ambulatory Visit: Payer: PPO | Admitting: Family

## 2023-05-20 ENCOUNTER — Telehealth: Payer: Self-pay

## 2023-05-20 ENCOUNTER — Encounter: Payer: Self-pay | Admitting: Family

## 2023-05-20 VITALS — BP 134/76 | HR 89 | Temp 98.0°F | Resp 16 | Ht 72.0 in | Wt 160.0 lb

## 2023-05-20 DIAGNOSIS — S0990XA Unspecified injury of head, initial encounter: Secondary | ICD-10-CM | POA: Diagnosis not present

## 2023-05-20 NOTE — Telephone Encounter (Signed)
Spoke with pt and advised him to keep his appt this morning at 9:30. Pt gave a verbal understanding.

## 2023-05-20 NOTE — Telephone Encounter (Signed)
Copied from CRM [id number]. Topic: Clinical - Medical Advice >> May 20, 2023  8:37 AM Florestine Avers wrote: Reason for CRM: Pt. States that he got a message saying his injury can be treated at home and he did not need to be seen in office. Pt would like a call from the nurse just to check in with him and confirm not needing to be seen.

## 2023-05-20 NOTE — Progress Notes (Signed)
Acute Office Visit  Subjective:     Patient ID: Tony Luna, male    DOB: Aug 24, 1940, 82 y.o.   MRN: [id number]  Chief Complaint  Patient presents with  . Head Injury    HPI Patient is in today for a check after he bumped his head while standing back up. He reports dropping his keys under the table, leaning over to pick them up and his head caught the corner of the glass table. He sustained an abrasion that has since healed.Reports about 10 seconds of dizziness then his symptoms resolved. He never fell (wants to be sure this is noted in his record). He never had any nausea, vomiting, blurred vision or double vision. Today, he feels normal. No concerns.   Review of Systems  Constitutional: Negative.   HENT: Negative.         Head injury after standing up and hitting head on a glass table.  Eyes:  Negative for blurred vision, double vision and photophobia.  Respiratory: Negative.    Cardiovascular: Negative.   Gastrointestinal: Negative.   Musculoskeletal: Negative.   Skin:        Healed abrasion to the crown of the head.   Neurological: Negative.  Negative for dizziness, focal weakness, weakness and headaches.  Psychiatric/Behavioral: Negative.    All other systems reviewed and are negative.  Past Medical History:  Diagnosis Date  . Allergy   . Arthritis   . Cirrhosis (HCC)   . Complication of anesthesia    :"need catheter"  . Dysrhythmia    ? something told by reg dr  unable to say what it was  . Esophageal tear   . Hypothyroidism   . Pneumonia    hx  . 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 Rotator cuff tear     Social History   Socioeconomic History  . Marital status: Married    Spouse name: Not on file  . Number of children: 2  . Years of education: Not on file  . Highest education level: Bachelor's degree (e.g., BA, AB, BS)  Occupational History  . Occupation: Designer, fashion/clothing  Tobacco Use  . Smoking status: Former    Current packs/day: 0.00     Average packs/day: 1 pack/day for 15.0 years (15.0 ttl pk-yrs)    Types: Cigarettes    Start date: 06/14/1968    Quit date: 06/15/1983    Years since quitting: 39.9  . Smokeless tobacco: Current    Types: Chew  Vaping Use  . Vaping status: Never Used  Substance and Sexual Activity  . Alcohol use: Yes    Alcohol/week: 10.0 standard drinks of alcohol    Types: 10 Glasses of wine per week  . Drug use: No  . Sexual activity: Not Currently  Other Topics Concern  . Not on file  Social History Narrative   [employer] graduate   Avid hunter   Very active   Walks 1 hour daily   Social Drivers of Health   Financial Resource Strain: Low Risk  (05/19/2023)   Overall Financial Resource Strain (CARDIA)   . Difficulty of Paying Living Expenses: Not hard at all  Food Insecurity: No Food Insecurity (05/19/2023)   Hunger Vital Sign   . Worried About Programme researcher, broadcasting/film/video in the Last Year: Never true   . Ran Out of Food in the Last Year: Never true  Transportation Needs: No Transportation Needs (05/19/2023)   PRAPARE - Transportation   . Lack of Transportation (Medical):  No   . Lack of Transportation (Non-Medical): No  Physical Activity: Sufficiently Active (05/19/2023)   Exercise Vital Sign   . Days of Exercise per Week: 3 days   . Minutes of Exercise per Session: 60 min  Stress: Stress Concern Present (05/19/2023)   [employer] of Occupational Health - Occupational Stress Questionnaire   . Feeling of Stress : Rather much  Social Connections: Unknown (05/19/2023)   Social Connection and Isolation Panel [NHANES]   . Frequency of Communication with Friends and Family: More than three times a week   . Frequency of Social Gatherings with Friends and Family: Twice a week   . Attends Religious Services: Patient declined   . Active Member of Clubs or Organizations: Yes   . Attends Banker Meetings: More than 4 times per year   . Marital Status: Married  Catering manager Violence:  Not At Risk (07/02/2022)   Humiliation, Afraid, Rape, and Kick questionnaire   . Fear of Current or Ex-Partner: No   . Emotionally Abused: No   . Physically Abused: No   . Sexually Abused: No    Past Surgical History:  Procedure Laterality Date  . BACK SURGERY    . blepharaplasty    . CATARACT EXTRACTION W/PHACO Left 02/21/2021   Procedure: CATARACT EXTRACTION PHACO AND INTRAOCULAR LENS PLACEMENT (IOC) LEFT 4.56 00:34.9;  Surgeon: Galen Manila, MD;  Location: [facility];  Service: Ophthalmology;  Laterality: Left;  . CAUTERY OF TURBINATES    . COLONOSCOPY WITH PROPOFOL N/A 07/19/2015   Procedure: COLONOSCOPY WITH PROPOFOL;  Surgeon: Midge Minium, MD;  Location: [facility] ENDOSCOPY;  Service: Endoscopy;  Laterality: N/A;  . COLONOSCOPY WITH PROPOFOL N/A 03/21/2021   Procedure: COLONOSCOPY WITH PROPOFOL;  Surgeon: Midge Minium, MD;  Location: [facility];  Service: Endoscopy;  Laterality: N/A;  . COLONOSCOPY WITH PROPOFOL N/A 04/18/2021   Procedure: COLONOSCOPY WITH PROPOFOL;  Surgeon: Midge Minium, MD;  Location: [facility] ENDOSCOPY;  Service: Endoscopy;  Laterality: N/A;  . COLONOSCOPY WITH PROPOFOL N/A 04/24/2022   Procedure: COLONOSCOPY WITH PROPOFOL;  Surgeon: Midge Minium, MD;  Location: [facility] ENDOSCOPY;  Service: Endoscopy;  Laterality: N/A;  . EYE SURGERY    . HERNIA REPAIR Left    inguinal herniorrhapy  . KNEE SURGERY     ? loose body/ chondral defect 45yrs  . ROTATOR CUFF REPAIR     left  . SPINE SURGERY    . TONSILLECTOMY    . 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 Alcohol abuse Mother   . Diabetes Mother   . Mental retardation Mother     No Known Allergies  Current Outpatient Medications on File Prior to Visit  Medication Sig Dispense Refill  . anastrozole (ARIMIDEX) 1 MG tablet  Take 1 mg by mouth once a week.    . Boron 6 MG TABS Take by mouth daily.    . clonazepam (KLONOPIN) 0.125 MG disintegrating tablet Take 1 tablet (0.125 mg total) by mouth daily. In the morning as needed for anxiety 30 tablet 0  . doxycycline (VIBRA-TABS) 100 MG tablet Take 1 tablet (100 mg total) by mouth 2 (two) times daily. 56 tablet 2  . Multiple Vitamin (MULTIVITAMIN WITH MINERALS) TABS tablet Take 1 tablet by mouth 2 (two) times daily.    . NP THYROID 60  MG tablet TAKE ONE TABLET BY MOUTH EVERY DAY 90 tablet 1  . OVER THE COUNTER MEDICATION Take 1 capsule by mouth 3 (three) times daily. Instaflex    . OVER THE COUNTER MEDICATION Take 250 mg by mouth daily. Bio Magnesium Citrate    . OVER THE COUNTER MEDICATION Take 20,000 mcg by mouth daily. Vitamin B12-methylfolate    . OVER THE COUNTER MEDICATION Take 800 mcg by mouth daily. Active B Folate    . OVER THE COUNTER MEDICATION 2 tablets daily. VitaPrime    . OVER THE COUNTER MEDICATION 1,000 mg daily. Super Bio-C buffered    . OVER THE COUNTER MEDICATION 1,000 mg daily. L'Arginine    . Saw Palmetto 160 MG TABS daily in the afternoon.    . sertraline (ZOLOFT) 50 MG tablet Take 1 tablet (50 mg total) by mouth daily. 30 tablet 3  . sildenafil (VIAGRA) 50 MG tablet USE AS DIRECTED 90 tablet 0  . tadalafil (CIALIS) 20 MG tablet TAKE ONE TABLET DAILY AS NEEDED FOR ERECTILE DYSFUNCTION 240 tablet 0  . tamsulosin (FLOMAX) 0.4 MG CAPS capsule TAKE 1 CAPSULE BY MOUTH ONCE DAILY 90 capsule 1  . Testosterone 75 MG PLLT by Implant route.    . traMADol (ULTRAM) 50 MG tablet TAKE ONE TABLET EVERY 6 HOURS AS NEEDED FOR PAIN 28 tablet 0  . zolpidem (AMBIEN CR) 6.25 MG CR tablet Take 1 tablet (6.25 mg total) by mouth at bedtime as needed. for sleep 90 tablet 0   No current facility-administered medications on file prior to visit.    BP 134/76   Pulse 89   Temp 98 F (36.7 C)   Resp 16   Ht 6' (1.829 m)   Wt 160 lb (72.6 kg)   SpO2 99%   BMI 21.70  kg/m chart      Objective:    BP 134/76   Pulse 89   Temp 98 F (36.7 C)   Resp 16   Ht 6' (1.829 m)   Wt 160 lb (72.6 kg)   SpO2 99%   BMI 21.70 kg/m    Physical Exam Vitals and nursing note reviewed.  Constitutional:      Appearance: Normal appearance. He is normal weight.  HENT:     Head: Normocephalic. No raccoon eyes or Battle's sign.      Comments: Healed abrasion to the head. Wound is closed    Right Ear: Tympanic membrane and ear canal normal.     Left Ear: Tympanic membrane and ear canal normal.  Eyes:     Extraocular Movements: Extraocular movements intact.     Conjunctiva/sclera: Conjunctivae normal.     Pupils: Pupils are equal, round, and reactive to light.  Cardiovascular:     Rate and Rhythm: Normal rate and regular rhythm.  Pulmonary:     Effort: Pulmonary effort is normal.     Breath sounds: Normal breath sounds.  Musculoskeletal:        General: Normal range of motion.  Skin:    General: Skin is warm and dry.  Neurological:     General: No focal deficit present.     Mental Status: He is alert and oriented to person, place, and time. Mental status is at baseline.     Cranial Nerves: No cranial nerve deficit.     Sensory: No sensory deficit.     Motor: No weakness.     Coordination: Coordination normal.     Gait: Gait normal.  Deep Tendon Reflexes: Reflexes normal.  Psychiatric:        Mood and Affect: Mood normal.        Behavior: Behavior normal.    No results found for any visits on 05/20/23.      Assessment & Plan:   Problem List Items Addressed This Visit   None Visit Diagnoses       Head injury, closed, initial encounter    -  Primary     Patient  DID NOT sustain a fall. Symptoms have resolved. He is 72 hours post fall with no residual symptoms. Likelihood of ICH is minimal. Call the office if he develops blurred vision, double vision, headache  No orders of the defined types were placed in this encounter.   No  follow-ups on file.  Eulis Foster, FNP

## 2023-05-20 NOTE — Telephone Encounter (Signed)
Pt is being seen by Worthy Rancher today at 10:40

## 2023-06-07 ENCOUNTER — Other Ambulatory Visit: Payer: Self-pay | Admitting: Internal Medicine

## 2023-07-03 ENCOUNTER — Ambulatory Visit: Payer: PPO | Admitting: *Deleted

## 2023-07-03 VITALS — Ht 72.0 in | Wt 160.0 lb

## 2023-07-03 DIAGNOSIS — Z0000 Encounter for general adult medical examination without abnormal findings: Secondary | ICD-10-CM

## 2023-07-03 NOTE — Patient Instructions (Addendum)
 Mr. Turman , Thank you for taking time to come for your Medicare Wellness Visit. I appreciate your ongoing commitment to your health goals. Please review the following plan we discussed and let me know if I can assist you in the future.   Referrals/Orders/Follow-Ups/Clinician Recommendations: Remember to update you shingles vaccines. Remember to call your GI doctor and schedule an eye appointment.  This is a list of the screening recommended for you and due dates:  Health Maintenance  Topic Date Due   COVID-19 Vaccine (4 - 2024-25 season) 01/27/2023   Colon Cancer Screening  04/25/2023   Flu Shot  08/26/2023*   Medicare Annual Wellness Visit  07/02/2024   DTaP/Tdap/Td vaccine (4 - Td or Tdap) 06/07/2027   Pneumonia Vaccine  Completed   HPV Vaccine  Aged Out   Hepatitis C Screening  Discontinued   Zoster (Shingles) Vaccine  Discontinued  *Topic was postponed. The date shown is not the original due date.    Advanced directives: (In Chart) A copy of your advanced directives are scanned into your chart should your provider ever need it.  Next Medicare Annual Wellness Visit scheduled for next year: Yes

## 2023-07-03 NOTE — Progress Notes (Signed)
 Subjective:   Tony Luna is a 83 y.o. male who presents for Medicare Annual/Subsequent preventive examination.  Visit Complete: Virtual I connected with  Helayne ONEIDA Soho on 07/03/23 by a audio enabled telemedicine application and verified that I am speaking with the correct person using two identifiers. This patient declined Interactive audio and acupuncturist. Therefore the visit was completed with audio only.   Patient Location: Other:  his vehicle  Provider Location: Office/Clinic  I discussed the limitations of evaluation and management by telemedicine. The patient expressed understanding and agreed to proceed.  Vital Signs: Because this visit was a virtual/telehealth visit, some criteria may be missing or patient reported. Any vitals not documented were not able to be obtained and vitals that have been documented are patient reported.    Cardiac Risk Factors include: advanced age (>26men, >27 women);male gender     Objective:    Today's Vitals   07/03/23 0931  Weight: 160 lb (72.6 kg)  Height: 6' (1.829 m)   Body mass index is 21.7 kg/m.     07/03/2023    9:47 AM 01/21/2023   10:01 AM 07/23/2022    3:10 PM 07/02/2022   11:10 AM 04/24/2022    9:47 AM 04/12/2022    3:38 PM 02/07/2022   11:10 AM  Advanced Directives  Does Patient Have a Medical Advance Directive? Yes Yes Yes Yes Yes Yes Yes  Type of Estate Agent of [place];Living will Healthcare Power of [place];Living will Healthcare Power of [place];Living will Healthcare Power of [place];Living will Living will Living will;Healthcare Power of [employer] Power of [place];Living will  Does patient want to make changes to medical advance directive? No - Patient declined  No - Patient declined No - Patient declined  Yes (ED - Information included in AVS)   Copy of Healthcare Power of Attorney in Chart? Yes - validated most recent copy scanned in chart (See row information) Yes -  validated most recent copy scanned in chart (See row information) Yes - validated most recent copy scanned in chart (See row information) Yes - validated most recent copy scanned in chart (See row information)       Current Medications (verified) Outpatient Encounter Medications as of 07/03/2023  Medication Sig   anastrozole (ARIMIDEX) 1 MG tablet Take 1 mg by mouth once a week.   Boron 6 MG TABS Take by mouth daily.   clonazepam  (KLONOPIN ) 0.125 MG disintegrating tablet Take 1 tablet (0.125 mg total) by mouth daily. In the morning as needed for anxiety   doxycycline  (VIBRA -TABS) 100 MG tablet Take 1 tablet (100 mg total) by mouth 2 (two) times daily.   Multiple Vitamin (MULTIVITAMIN WITH MINERALS) TABS tablet Take 1 tablet by mouth 2 (two) times daily.   NP THYROID  60 MG tablet TAKE ONE TABLET BY MOUTH EVERY DAY   OVER THE COUNTER MEDICATION Take 1 capsule by mouth 3 (three) times daily. Instaflex   OVER THE COUNTER MEDICATION Take 250 mg by mouth daily. Bio Magnesium  Citrate   OVER THE COUNTER MEDICATION Take 20,000 mcg by mouth daily. Vitamin B12-methylfolate   OVER THE COUNTER MEDICATION Take 800 mcg by mouth daily. Active B Folate   OVER THE COUNTER MEDICATION 2 tablets daily. VitaPrime   OVER THE COUNTER MEDICATION 1,000 mg daily. Super Bio-C buffered   OVER THE COUNTER MEDICATION 1,000 mg daily. L'Arginine   Saw Palmetto 160 MG TABS daily in the afternoon.   sertraline  (ZOLOFT ) 50 MG tablet Take 1  tablet (50 mg total) by mouth daily.   sildenafil  (VIAGRA ) 50 MG tablet USE AS DIRECTED   tadalafil  (CIALIS ) 20 MG tablet TAKE ONE TABLET DAILY AS NEEDED FOR ERECTILE DYSFUNCTION   tamsulosin  (FLOMAX ) 0.4 MG CAPS capsule TAKE 1 CAPSULE BY MOUTH ONCE DAILY   Testosterone  75 MG PLLT by Implant route.   traMADol  (ULTRAM ) 50 MG tablet TAKE ONE TABLET EVERY 6 HOURS AS NEEDED FOR PAIN   zolpidem  (AMBIEN  CR) 6.25 MG CR tablet Take 1 tablet (6.25 mg total) by mouth at bedtime as needed. for sleep    No facility-administered encounter medications on file as of 07/03/2023.    Allergies (verified) Patient has no known allergies.   History: Past Medical History:  Diagnosis Date   Allergy    Arthritis    Cirrhosis (HCC)    Complication of anesthesia    :need catheter   Dysrhythmia    ? something told by reg dr  unable to say what it was   Esophageal tear    Hypothyroidism    Pneumonia    hx   Primary osteoarthritis of left knee 06/22/2014   Rotator cuff tear    Past Surgical History:  Procedure Laterality Date   BACK SURGERY     blepharaplasty     CATARACT EXTRACTION W/PHACO Left 02/21/2021   Procedure: CATARACT EXTRACTION PHACO AND INTRAOCULAR LENS PLACEMENT (IOC) LEFT 4.56 00:34.9;  Surgeon: Jaye Fallow, MD;  Location: [facility];  Service: Ophthalmology;  Laterality: Left;   CAUTERY OF TURBINATES     COLONOSCOPY WITH PROPOFOL  N/A 07/19/2015   Procedure: COLONOSCOPY WITH PROPOFOL ;  Surgeon: Rogelia Copping, MD;  Location: [facility] END[facility]COPY;  Service: Endoscopy;  Laterality: N/A;   COLONOSCOPY WITH PROPOFOL  N/A 03/21/2021   Procedure: COLONOSCOPY WITH PROPOFOL ;  Surgeon: Copping Rogelia, MD;  Location: [facility];  Service: Endoscopy;  Laterality: N/A;   COLONOSCOPY WITH PROPOFOL  N/A 04/18/2021   Procedure: COLONOSCOPY WITH PROPOFOL ;  Surgeon: Copping Rogelia, MD;  Location: [facility] ENDOSCOPY;  Service: Endoscopy;  Laterality: N/A;   COLONOSCOPY WITH PROPOFOL  N/A 04/24/2022   Procedure: COLONOSCOPY WITH PROPOFOL ;  Surgeon: Copping Rogelia, MD;  Location: [facility] ENDOSCOPY;  Service: Endoscopy;  Laterality: N/A;   EYE SURGERY     HERNIA REPAIR Left    inguinal herniorrhapy   KNEE SURGERY     ? 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   Procedure: TOTAL KNEE ARTHROPLASTY;  Surgeon: Fonda SHAUNNA Olmsted, MD;  Location: [facility] OR;  Service: Orthopedics;  Laterality: Left;    Family History  Problem Relation Age of Onset   Alcohol abuse Mother    Diabetes Mother    Mental retardation Mother    Social History   Socioeconomic History   Marital status: Married    Spouse name: Not on file   Number of children: 2   Years of education: Not on file   Highest education level: Bachelor's degree (e.g., BA, AB, BS)  Occupational History   Occupation: textiles  Tobacco Use   Smoking status: Former    Current packs/day: 0.00    Average packs/day: 1 pack/day for 15.0 years (15.0 ttl pk-yrs)    Types: Cigarettes    Start date: 06/14/1968    Quit date: 06/15/1983    Years since quitting: 40.0   Smokeless tobacco: Current    Types: Chew  Vaping  Use   Vaping status: Never Used  Substance and Sexual Activity   Alcohol use: Yes    Alcohol/week: 10.0 standard drinks of alcohol    Types: 10 Glasses of wine per week   Drug use: No   Sexual activity: Not Currently  Other Topics Concern   Not on file  Social History Narrative   [employer] graduate   Avid hunter   Very active   Walks 1 hour daily   Social Drivers of Health   Financial Resource Strain: Low Risk  (07/03/2023)   Overall Financial Resource Strain (CARDIA)    Difficulty of Paying Living Expenses: Not hard at all  Food Insecurity: No Food Insecurity (07/03/2023)   Hunger Vital Sign    Worried About Running Out of Food in the Last Year: Never true    Ran Out of Food in the Last Year: Never true  Transportation Needs: No Transportation Needs (07/03/2023)   PRAPARE - Administrator, Civil Service (Medical): No    Lack of Transportation (Non-Medical): No  Physical Activity: Sufficiently Active (07/03/2023)   Exercise Vital Sign    Days of Exercise per Week: 3 days    Minutes of Exercise per Session: 60 min  Stress: No Stress Concern Present (07/03/2023)   [employer] of Occupational Health - Occupational Stress Questionnaire    Feeling of Stress : Not at all  Recent Concern: Stress - Stress  Concern Present (05/19/2023)   [employer] of Occupational Health - Occupational Stress Questionnaire    Feeling of Stress : Rather much  Social Connections: Moderately Integrated (07/03/2023)   Social Connection and Isolation Panel [NHANES]    Frequency of Communication with Friends and Family: More than three times a week    Frequency of Social Gatherings with Friends and Family: Twice a week    Attends Religious Services: Never    Database Administrator or Organizations: Yes    Attends Engineer, Structural: More than 4 times per year    Marital Status: Married    Tobacco Counseling Ready to quit: Not Answered Counseling given: Not Answered   Clinical Intake:  Pre-visit preparation completed: Yes  Pain : No/denies pain     BMI - recorded: 21.7 Nutritional Status: BMI of 19-24  Normal Nutritional Risks: None Diabetes: No  How often do you need to have someone help you when you read instructions, pamphlets, or other written materials from your doctor or pharmacy?: 1 - Never  Interpreter Needed?: No  Information entered by :: R. Romuald Mccaslin LPN   Activities of Daily Living    07/03/2023    9:33 AM  In your present state of health, do you have any difficulty performing the following activities:  Hearing? 1  Comment wears aids  Vision? 0  Comment readers  Difficulty concentrating or making decisions? 0  Walking or climbing stairs? 0  Dressing or bathing? 0  Doing errands, shopping? 0  Preparing Food and eating ? N  Using the Toilet? N  In the past six months, have you accidently leaked urine? Y  Do you have problems with loss of bowel control? N  Managing your Medications? N  Managing your Finances? N  Housekeeping or managing your Housekeeping? N    Patient Care Team: Marylynn Verneita CROME, MD as PCP - General (Internal Medicine) Darliss Rogue, MD as PCP - Cardiology (Cardiology) Agrawal, Kavita, MD as Consulting Physician (Oncology)  Indicate any  recent Medical Services you may have received from  other than [facility] providers in the past year (date may be approximate).     Assessment:   This is a routine wellness examination for [place].  Hearing/Vision screen Hearing Screening - Comments:: Wears aids Vision Screening - Comments:: readers   Goals Addressed             This Visit's Progress    Patient Stated       Wants to continue to hunt and fish        Depression Screen    07/03/2023    9:43 AM 03/22/2023   10:30 AM 07/02/2022   11:04 AM 02/02/2022    9:01 AM 07/31/2021    8:40 AM 06/19/2021    2:12 PM 01/31/2021    3:13 PM  PHQ 2/9 Scores  PHQ - 2 Score 0 1 0 0 0 0 0  PHQ- 9 Score 2 2  0       Fall Risk    07/03/2023    9:37 AM 03/22/2023   10:30 AM 07/02/2022   11:04 AM 06/28/2022    4:55 PM 02/02/2022    9:01 AM  Fall Risk   Falls in the past year? 0 0 0 0 0  Number falls in past yr: 0 0  0   Injury with Fall? 0 0  0   Risk for fall due to : No Fall Risks No Fall Risks     Follow up Falls prevention discussed;Falls evaluation completed Falls evaluation completed Falls evaluation completed;Falls prevention discussed  Falls evaluation completed    MEDICARE RISK AT HOME: Medicare Risk at Home Any stairs in or around the home?: Yes If so, are there any without handrails?: No Home free of loose throw rugs in walkways, pet beds, electrical cords, etc?: Yes Adequate lighting in your home to reduce risk of falls?: Yes Life alert?: No Use of a cane, walker or w/c?: No Grab bars in the bathroom?: No Shower chair or bench in shower?: Yes Elevated toilet seat or a handicapped toilet?: No   Cognitive Function:    06/15/2019   12:00 PM  MMSE - Mini Mental State Exam  Not completed: Unable to complete        07/03/2023    9:48 AM 07/02/2022   11:14 AM 06/09/2018    8:25 AM 06/06/2017    8:33 AM 05/29/2016   11:45 AM  6CIT Screen  What Year? 0 points 0 points 0 points 0 points 0 points  What month? 0 points 0 points 0  points 0 points 0 points  What time? 0 points 0 points 0 points 0 points 0 points  Count back from 20 0 points 0 points 0 points 0 points 0 points  Months in reverse 0 points 0 points 0 points 0 points 0 points  Repeat phrase 0 points 0 points 0 points 0 points 0 points  Total Score 0 points 0 points 0 points 0 points 0 points    Immunizations Immunization History  Administered Date(s) Administered   Fluad Quad(high Dose 65+) 04/27/2019, 03/31/2020, 01/31/2021   Hep A / Hep B 12/13/2015, 01/17/2016, 06/26/2016   Influenza Whole 02/26/2008   Influenza, High Dose Seasonal PF 04/23/2018, 02/02/2022   Influenza,inj,Quad PF,6+ Mos 03/12/2013, 02/24/2014   Influenza-Unspecified 02/09/2016   PFIZER(Purple Top)SARS-COV-2 Vaccination 06/24/2019, 07/15/2019, 04/28/2020   PNEUMOCOCCAL CONJUGATE-20 01/31/2021   Pneumococcal Conjugate-13 08/06/2013   Pneumococcal Polysaccharide-23 05/24/2011   Td 05/28/2004   Tdap 02/13/2006, 06/06/2017   Zoster, Live 05/30/2007  TDAP status: Up to date  Flu Vaccine status: Due, Education has been provided regarding the importance of this vaccine. Advised may receive this vaccine at local pharmacy or Health Dept. Aware to provide a copy of the vaccination record if obtained from local pharmacy or Health Dept. Verbalized acceptance and understanding.  Pneumococcal vaccine status: Up to date  Covid-19 vaccine status: Information provided on how to obtain vaccines.   Qualifies for Shingles Vaccine? Yes   Zostavax completed Yes   Shingrix Completed?: No.    Education has been provided regarding the importance of this vaccine. Patient has been advised to call insurance company to determine out of pocket expense if they have not yet received this vaccine. Advised may also receive vaccine at local pharmacy or Health Dept. Verbalized acceptance and understanding. Patient is going to the pharmacy today.  Screening Tests Health Maintenance  Topic Date Due    COVID-19 Vaccine (4 - 2024-25 season) 01/27/2023   Colonoscopy  04/25/2023   Medicare Annual Wellness (AWV)  07/03/2023   INFLUENZA VACCINE  08/26/2023 (Originally 12/27/2022)   DTaP/Tdap/Td (4 - Td or Tdap) 06/07/2027   Pneumonia Vaccine 17+ Years old  Completed   HPV VACCINES  Aged Out   Hepatitis C Screening  Discontinued   Zoster Vaccines- Shingrix  Discontinued    Health Maintenance  Health Maintenance Due  Topic Date Due   COVID-19 Vaccine (4 - 2024-25 season) 01/27/2023   Colonoscopy  04/25/2023   Medicare Annual Wellness (AWV)  07/03/2023    Colorectal cancer screening: Type of screening: Colonoscopy. Completed 03/2022. Repeat every 1 years Patient stated that he will contact his GI doctor.  Lung Cancer Screening: (Low Dose CT Chest recommended if Age 78-80 years, 20 pack-year currently smoking OR have quit w/in 15years.) does not qualify.     Additional Screening:  Hepatitis C Screening: does qualify; Completed 01/2022  Vision Screening: Recommended annual ophthalmology exams for early detection of glaucoma and other disorders of the eye. Is the patient up to date with their annual eye exam?  No  Who is the provider or what is the name of the office in which the patient attends annual eye exams? [facility], patient stated that he will call and schedule an appointment If pt is not established with a provider, would they like to be referred to a provider to establish care? No .   Dental Screening: Recommended annual dental exams for proper oral hygiene    Community Resource Referral / Chronic Care Management: CRR required this visit?  No   CCM required this visit?  No     Plan:     I have personally reviewed and noted the following in the patient's chart:   Medical and social history Use of alcohol, tobacco or illicit drugs  Current medications and supplements including opioid prescriptions. Patient is currently taking opioid prescriptions. Information  provided to patient regarding non-opioid alternatives. Patient advised to discuss non-opioid treatment plan with their provider. Functional ability and status Nutritional status Physical activity Advanced directives List of other physicians Hospitalizations, surgeries, and ER visits in previous 12 months Vitals Screenings to include cognitive, depression, and falls Referrals and appointments  In addition, I have reviewed and discussed with patient certain preventive protocols, quality metrics, and best practice recommendations. A written personalized care plan for preventive services as well as general preventive health recommendations were provided to patient.     Angeline Fredericks, LPN   11/28/7972   After Visit Summary: (MyChart) Due to  this being a telephonic visit, the after visit summary with patients personalized plan was offered to patient via MyChart   Nurse Notes: see routing comments

## 2023-07-08 DIAGNOSIS — R718 Other abnormality of red blood cells: Secondary | ICD-10-CM | POA: Diagnosis not present

## 2023-07-11 DIAGNOSIS — M2550 Pain in unspecified joint: Secondary | ICD-10-CM | POA: Diagnosis not present

## 2023-07-11 DIAGNOSIS — E291 Testicular hypofunction: Secondary | ICD-10-CM | POA: Diagnosis not present

## 2023-07-11 DIAGNOSIS — R5383 Other fatigue: Secondary | ICD-10-CM | POA: Diagnosis not present

## 2023-07-11 DIAGNOSIS — Z6821 Body mass index (BMI) 21.0-21.9, adult: Secondary | ICD-10-CM | POA: Diagnosis not present

## 2023-07-16 ENCOUNTER — Other Ambulatory Visit: Payer: Self-pay | Admitting: Internal Medicine

## 2023-07-16 DIAGNOSIS — F411 Generalized anxiety disorder: Secondary | ICD-10-CM

## 2023-07-23 ENCOUNTER — Ambulatory Visit: Payer: PPO | Admitting: Internal Medicine

## 2023-07-23 ENCOUNTER — Other Ambulatory Visit: Payer: PPO

## 2023-07-25 ENCOUNTER — Other Ambulatory Visit: Payer: Self-pay | Admitting: Internal Medicine

## 2023-07-25 ENCOUNTER — Inpatient Hospital Stay: Payer: PPO | Attending: Internal Medicine

## 2023-07-25 ENCOUNTER — Telehealth: Payer: Self-pay | Admitting: Internal Medicine

## 2023-07-25 ENCOUNTER — Encounter: Payer: Self-pay | Admitting: Internal Medicine

## 2023-07-25 ENCOUNTER — Inpatient Hospital Stay: Payer: PPO | Admitting: Internal Medicine

## 2023-07-25 DIAGNOSIS — E039 Hypothyroidism, unspecified: Secondary | ICD-10-CM | POA: Diagnosis not present

## 2023-07-25 DIAGNOSIS — R7989 Other specified abnormal findings of blood chemistry: Secondary | ICD-10-CM | POA: Insufficient documentation

## 2023-07-25 DIAGNOSIS — Z79890 Hormone replacement therapy (postmenopausal): Secondary | ICD-10-CM | POA: Diagnosis not present

## 2023-07-25 DIAGNOSIS — K7030 Alcoholic cirrhosis of liver without ascites: Secondary | ICD-10-CM

## 2023-07-25 DIAGNOSIS — E291 Testicular hypofunction: Secondary | ICD-10-CM | POA: Diagnosis not present

## 2023-07-25 DIAGNOSIS — D696 Thrombocytopenia, unspecified: Secondary | ICD-10-CM | POA: Insufficient documentation

## 2023-07-25 DIAGNOSIS — R7401 Elevation of levels of liver transaminase levels: Secondary | ICD-10-CM

## 2023-07-25 LAB — CBC WITH DIFFERENTIAL/PLATELET
Abs Immature Granulocytes: 0.03 10*3/uL (ref 0.00–0.07)
Basophils Absolute: 0 10*3/uL (ref 0.0–0.1)
Basophils Relative: 1 %
Eosinophils Absolute: 0.1 10*3/uL (ref 0.0–0.5)
Eosinophils Relative: 2 %
HCT: 36 % — ABNORMAL LOW (ref 39.0–52.0)
Hemoglobin: 12.8 g/dL — ABNORMAL LOW (ref 13.0–17.0)
Immature Granulocytes: 1 %
Lymphocytes Relative: 18 %
Lymphs Abs: 0.7 10*3/uL (ref 0.7–4.0)
MCH: 34 pg (ref 26.0–34.0)
MCHC: 35.6 g/dL (ref 30.0–36.0)
MCV: 95.7 fL (ref 80.0–100.0)
Monocytes Absolute: 0.3 10*3/uL (ref 0.1–1.0)
Monocytes Relative: 8 %
Neutro Abs: 2.8 10*3/uL (ref 1.7–7.7)
Neutrophils Relative %: 70 %
Platelets: 116 10*3/uL — ABNORMAL LOW (ref 150–400)
RBC: 3.76 MIL/uL — ABNORMAL LOW (ref 4.22–5.81)
RDW: 11.8 % (ref 11.5–15.5)
WBC: 4 10*3/uL (ref 4.0–10.5)
nRBC: 0 % (ref 0.0–0.2)

## 2023-07-25 LAB — COMPREHENSIVE METABOLIC PANEL
ALT: 184 U/L — ABNORMAL HIGH (ref 0–44)
AST: 254 U/L (ref 15–41)
Albumin: 3.6 g/dL (ref 3.5–5.0)
Alkaline Phosphatase: 182 U/L — ABNORMAL HIGH (ref 38–126)
Anion gap: 16 — ABNORMAL HIGH (ref 5–15)
BUN: 14 mg/dL (ref 8–23)
CO2: 20 mmol/L — ABNORMAL LOW (ref 22–32)
Calcium: 8.9 mg/dL (ref 8.9–10.3)
Chloride: 104 mmol/L (ref 98–111)
Creatinine, Ser: 0.91 mg/dL (ref 0.61–1.24)
GFR, Estimated: >60 mL/min (ref >60)
Glucose, Bld: 107 mg/dL — ABNORMAL HIGH (ref 70–99)
Potassium: 3.7 mmol/L (ref 3.5–5.1)
Sodium: 140 mmol/L (ref 135–145)
Total Bilirubin: 2.3 mg/dL — ABNORMAL HIGH (ref 0.0–1.2)
Total Protein: 7.1 g/dL (ref 6.5–8.1)

## 2023-07-25 LAB — IRON AND TIBC
Iron: 183 ug/dL — ABNORMAL HIGH (ref 45–182)
Saturation Ratios: 84 % — ABNORMAL HIGH (ref 17.9–39.5)
TIBC: 217 ug/dL — ABNORMAL LOW (ref 250–450)
UIBC: 34 ug/dL

## 2023-07-25 LAB — FERRITIN: Ferritin: 2221 ng/mL — ABNORMAL HIGH (ref 24–336)

## 2023-07-25 NOTE — Progress Notes (Unsigned)
 [facility]  [phone]) [phone] [phone]  ID: ETHRIDGE SOLLENBERGER OB: 02/28/1941  MR#: [id number]  MWN#:[id number]  Patient Care Team: Sherlene Shams, MD as PCP - General (Internal Medicine) Debbe Odea, MD as PCP - Cardiology (Cardiology) Michaelyn Barter, MD as Consulting Physician (Oncology) Midge Minium, MD as Consulting Physician (Gastroenterology)   HPI: Tony Luna is a 83 y.o. male with past medical history of hypothyroidism, liver cirrhosis, and mild AR was seen in hematology clinic for elevated iron and iron saturation.  Patient reports diagnosis of liver cirrhosis in 2016. US liver elastography on 07/31/2016 showed morphologic features of cirrhosis, fibrosis score of some F3 plus F4 with high risk of fibrosis.  Hepatitis B and C done in 2017 was negative.  He reports drinking 2 to 3 glasses of wine every day. He was seen by Dr. 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 He follows with Dr. Darrick Huntsman his PCP for surveillance of [facility].  He reports arthritis in back, shoulder and left knee. He follows with Dr. Lonna Cobb for BPH and erectile dysfunction and uses sildenafil intermittently.  Of chart, he is also seeing urology in [facility] for hypogonadism and subcutaneous testosterone pellet implantation. Echocardiogram in November 2021 showed normal EF with grade 1 diastolic dysfunction and mild AR. He denies any family history of iron overload or hemochromatosis. Denies skin pigmentation.   Patient denies fever, chills, nausea, vomiting, shortness of breath, cough, abdominal pain, bleeding, bowel or bladder issues. Energy level is good.  Appetite is good.  Denies any weight loss. Denies pain.  Labs from 02/02/2022 -CBC showed WBC of 4, hemoglobin 14.4, platelet 139.  He has chronic thrombocytopenia with platelets ranging 110 to 140s since 2016.  AST mildly elevated to 41, ALT normal, T. bili 1.3. Ferritin in 01/2018 248, in 04/2019 490, 01/2022  309. Percent saturation in 11/2015 of 33, in 01/2022 59%  INTERVAL HISTORY-  Patient was seen today to discuss labs. He continues to feel good.  Continues to exercise 3-4 times a week in the gym.  Just reports multiple nighttime awakenings takes Ambien.  Was seen by sleep doctor and was recommended CPAP machine which she used and did not notice any benefit.   REVIEW OF SYSTEMS:   Review of Systems  All other systems reviewed and are negative.   As per HPI. Otherwise, a complete review of systems is negative.  PAST MEDICAL HISTORY: Past Medical History:  Diagnosis Date  . Allergy   . Arthritis   . Cirrhosis (HCC)   . Complication of anesthesia    :"need catheter"  . Dysrhythmia    ? something told by reg dr  unable to say what it was  . Esophageal tear   . Hypothyroidism   . Pneumonia    hx  . 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 Rotator cuff tear     PAST SURGICAL HISTORY: Past Surgical History:  Procedure Laterality Date  . BACK SURGERY    . blepharaplasty    . CATARACT EXTRACTION W/PHACO Left 02/21/2021   Procedure: CATARACT EXTRACTION PHACO AND INTRAOCULAR LENS PLACEMENT (IOC) LEFT 4.56 00:34.9;  Surgeon: Galen Manila, MD;  Location: [facility];  Service: Ophthalmology;  Laterality: Left;  . CAUTERY OF TURBINATES    . COLONOSCOPY WITH PROPOFOL N/A 07/19/2015   Procedure: COLONOSCOPY WITH PROPOFOL;  Surgeon: Midge Minium, MD;  Location: [facility] ENDOSCOPY;  Service: Endoscopy;  Laterality: N/A;  . COLONOSCOPY WITH PROPOFOL N/A 03/21/2021  Procedure: COLONOSCOPY WITH PROPOFOL;  Surgeon: Midge Minium, MD;  Location: [facility] ENDOSCOPY;  Service: Endoscopy;  Laterality: N/A;  . COLONOSCOPY WITH PROPOFOL N/A 04/18/2021   Procedure: COLONOSCOPY WITH PROPOFOL;  Surgeon: Midge Minium, MD;  Location: [facility] ENDOSCOPY;  Service: Endoscopy;  Laterality: N/A;  . COLONOSCOPY WITH PROPOFOL N/A 04/24/2022   Procedure: COLONOSCOPY WITH PROPOFOL;  Surgeon: Midge Minium, MD;  Location: [facility] ENDOSCOPY;  Service: Endoscopy;  Laterality: N/A;  . EYE SURGERY    . HERNIA REPAIR Left    inguinal herniorrhapy  . KNEE SURGERY     ? loose body/ chondral defect 1yrs  . ROTATOR CUFF REPAIR     left  . SPINE SURGERY    . TONSILLECTOMY    . TOTAL KNEE ARTHROPLASTY Left 06/22/2014   Procedure: TOTAL KNEE ARTHROPLASTY;  Surgeon: Eulas Post, MD;  Location: [facility] OR;  Service: Orthopedics;  Laterality: Left;    FAMILY HISTORY: Family History  Problem Relation Age of Onset  . Alcohol abuse Mother   . Diabetes Mother   . Mental retardation Mother     HEALTH MAINTENANCE: Social History   Tobacco Use  . Smoking status: Former    Current packs/day: 0.00    Average packs/day: 1 pack/day for 15.0 years (15.0 ttl pk-yrs)    Types: Cigarettes    Start date: 06/14/1968    Quit date: 06/15/1983    Years since quitting: 40.1  . Smokeless tobacco: Current    Types: Chew  Vaping Use  . Vaping status: Never Used  Substance Use Topics  . Alcohol use: Yes    Alcohol/week: 10.0 standard drinks of alcohol    Types: 10 Glasses of wine per week  . Drug use: No     No Known Allergies  Current Outpatient Medications  Medication Sig Dispense Refill  . anastrozole (ARIMIDEX) 1 MG tablet Take 1 mg by mouth once a week.    . Boron 6 MG TABS Take by mouth daily.    . clonazepam (KLONOPIN) 0.125 MG disintegrating tablet Take 1 tablet (0.125 mg total) by mouth daily. In the morning as needed for anxiety 30 tablet 0  . doxycycline (VIBRA-TABS) 100 MG tablet Take 1 tablet (100 mg total) by mouth 2 (two) times daily. 56 tablet 2  . Multiple Vitamin (MULTIVITAMIN WITH MINERALS) TABS tablet Take 1 tablet by mouth 2 (two) times daily.    . NP THYROID 60 MG tablet TAKE ONE TABLET BY MOUTH EVERY DAY 90 tablet 1  . OVER THE COUNTER MEDICATION Take 1 capsule by mouth 3 (three) times daily. Instaflex    . OVER THE COUNTER MEDICATION Take 250 mg by mouth daily. Bio Magnesium  Citrate    . OVER THE COUNTER MEDICATION Take 20,000 mcg by mouth daily. Vitamin B12-methylfolate    . OVER THE COUNTER MEDICATION Take 800 mcg by mouth daily. Active B Folate    . OVER THE COUNTER MEDICATION 2 tablets daily. VitaPrime    . OVER THE COUNTER MEDICATION 1,000 mg daily. Super Bio-C buffered    . OVER THE COUNTER MEDICATION 1,000 mg daily. L'Arginine    . Saw Palmetto 160 MG TABS daily in the afternoon.    . sertraline (ZOLOFT) 50 MG tablet TAKE ONE TABLET BY MOUTH DAILY 30 tablet 3  . sildenafil (VIAGRA) 50 MG tablet USE AS DIRECTED 90 tablet 0  . tadalafil (CIALIS) 20 MG tablet TAKE ONE TABLET DAILY AS NEEDED FOR ERECTILE DYSFUNCTION 240 tablet 0  . tamsulosin (FLOMAX)  0.4 MG CAPS capsule TAKE 1 CAPSULE BY MOUTH ONCE DAILY 90 capsule 1  . Testosterone 75 MG PLLT by Implant route.    . traMADol (ULTRAM) 50 MG tablet TAKE ONE TABLET EVERY 6 HOURS AS NEEDED FOR PAIN 28 tablet 0  . zolpidem (AMBIEN CR) 6.25 MG CR tablet Take 1 tablet (6.25 mg total) by mouth at bedtime as needed. for sleep 90 tablet 0   No current facility-administered medications for this visit.    OBJECTIVE: Vitals:   07/25/23 1049  BP: 119/81  Pulse: (!) 101  Resp: 16  Temp: 98.6 F (37 C)  SpO2: 99%     Body mass index is 21.43 kg/m.      General: Well-developed, well-nourished, no acute distress. Eyes: Pink conjunctiva, anicteric sclera. HEENT: Normocephalic, moist mucous membranes, clear oropharnyx. Lungs: Clear to auscultation bilaterally. Heart: Regular rate and rhythm. No rubs, murmurs, or gallops. Abdomen: Soft, nontender, nondistended. No organomegaly noted, normoactive bowel sounds. Musculoskeletal: No edema, cyanosis, or clubbing. Neuro: Alert, answering all questions appropriately. Cranial nerves grossly intact. Skin: No rashes or petechiae noted. Psych: Normal affect. Lymphatics: No cervical, calvicular, axillary or inguinal LAD.   LAB RESULTS:  Lab Results  Component Value Date    NA 143 03/22/2023   K 4.1 03/22/2023   CL 100 03/22/2023   CO2 29 03/22/2023   GLUCOSE 99 03/22/2023   BUN 19 03/22/2023   CREATININE 1.16 03/22/2023   CALCIUM 10.4 03/22/2023   PROT 6.8 03/22/2023   ALBUMIN 4.3 03/22/2023   AST 52 (H) 03/22/2023   ALT 45 03/22/2023   ALKPHOS 122 (H) 03/22/2023   BILITOT 1.5 (H) 03/22/2023   GFRNONAA 77 04/30/2019   GFRAA 90 04/30/2019    Lab Results  Component Value Date   WBC 4.0 07/25/2023   NEUTROABS 2.8 07/25/2023   HGB 12.8 (L) 07/25/2023   HCT 36.0 (L) 07/25/2023   MCV 95.7 07/25/2023   PLT 116 (L) 07/25/2023    Lab Results  Component Value Date   TIBC 272 03/22/2023   TIBC 286 02/22/2023   TIBC 266 01/21/2023   FERRITIN 816 (H) 03/22/2023   FERRITIN 593 (H) 02/22/2023   FERRITIN 673 (H) 01/21/2023   IRONPCTSAT 68 (H) 03/22/2023   IRONPCTSAT 66 (H) 02/22/2023   IRONPCTSAT 65 (H) 01/21/2023     STUDIES: No results found.  ASSESSMENT AND PLAN:   Tony Luna is a 83 y.o. male with pmh of hypothyroidism, liver cirrhosis, and mild AR was seen in hematology clinic for elevated iron and iron saturation.  # Iron overload # Liver cirrhosis  - Ferritin in 01/2018 248, in 04/2019 490, 01/2022 309. - Percent saturation in 11/2015 of 33, in 01/2022 59%  - Patient reports diagnosis of liver cirrhosis in 2016. US liver elastography on 07/31/2016 showed morphologic features of cirrhosis, fibrosis score of some F3 plus F4 with high risk of fibrosis.  Hepatitis B and C done in 2017 was negative.  He reports drinking 2 to 3 glasses of wine every day. He was seen by Dr. 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 Denies family hx of iron overload.   - Hemochromatosis gene panel for H63D and C282Y negative.   -His elevated iron level and saturation is likely from the liver cirrhosis.  Last iron levels showed normal saturation at 32%.  No indication for phlebotomy.   - Labs from February 2024 ferritin 274, saturation 38%.  Repeat  iron panel pending today.  Addendum-  Ferritin 673, saturation 65%, WBC 3.8, hemoglobin 13.5 and platelet 119. Will reach out to the patient to have repeat iron studies in 2 to 3 weeks.  If levels are persistently high will consider for phlebotomy.  # Thrombocytopenia  - likely secondary to liver cirrhosis.  Stable - b12, folate normal - hepatitis B and C negative    No orders of the defined types were placed in this encounter.  RTC in 6 months for MD visit, labs Will schedule for labs in 2 to 3 weeks for repeat iron panel  Patient expressed understanding and was in agreement with this plan. He also understands that He can call clinic at any time with any questions, concerns, or complaints.   I spent a total of 25 minutes reviewing chart data, face-to-face evaluation with the patient, counseling and coordination of care as detailed above.  Michaelyn Barter, MD   07/25/2023 10:52 AM

## 2023-07-25 NOTE — Telephone Encounter (Signed)
 Lft pt vm to call ofc to sch [place]. thanks ?

## 2023-07-25 NOTE — Assessment & Plan Note (Addendum)
 Diagnosed in 2016 .  ETIOLOGY presumed  ETOH,  ALSO HAS IRON OVERLOAD without indication for phlebetomy per hematology.  HH workup negative.  He has been drinkg 2-3 glasses of wine/night and now has  AST 10 X NORMAL.  NEEDS REPEAT LFTS in 2 weeks, .  ALCOHOL ABSTINENCE, ULTRASOUND AND AFP TUMOR MARKER

## 2023-07-25 NOTE — Progress Notes (Unsigned)
 Patient has been having some urination problems, like the urge comes to urinate, but when he goes to urinate only a small stream comes out, no burning or pain, and no blood or discoloration. He says that it has been ongoing just worsening.

## 2023-07-25 NOTE — Telephone Encounter (Signed)
 Left message to call office to schedule a lab in 2 weeks, (around 08/08/2023). Also let patient know that Dr Darrick Huntsman has ordered a ultrasound and office will call to schedule. Patient will need his April appointment with Dr Darrick Huntsman moved up to after the ultrasound has been done.

## 2023-07-26 ENCOUNTER — Telehealth: Payer: Self-pay | Admitting: Internal Medicine

## 2023-07-26 DIAGNOSIS — R7401 Elevation of levels of liver transaminase levels: Secondary | ICD-10-CM | POA: Insufficient documentation

## 2023-07-26 DIAGNOSIS — R7989 Other specified abnormal findings of blood chemistry: Secondary | ICD-10-CM | POA: Insufficient documentation

## 2023-07-26 NOTE — Telephone Encounter (Signed)
 Lft pt vm to call ofc to sch [place]. thanks ?

## 2023-07-29 ENCOUNTER — Telehealth: Payer: Self-pay

## 2023-07-29 DIAGNOSIS — G4733 Obstructive sleep apnea (adult) (pediatric): Secondary | ICD-10-CM | POA: Diagnosis not present

## 2023-07-29 NOTE — Telephone Encounter (Signed)
 Copied from CRM [id number]. Topic: Appointments - Appointment Scheduling >> Jul 29, 2023  5:08 PM Tony Luna wrote: Patient needs to schedule imaging (US Abdomen Limited RUQ (LIVER/GB)

## 2023-07-30 ENCOUNTER — Telehealth: Payer: Self-pay | Admitting: Internal Medicine

## 2023-07-30 NOTE — Telephone Encounter (Signed)
 Lft pt vm to call ofc to sch [place]. thanks ?

## 2023-07-31 ENCOUNTER — Encounter: Payer: Self-pay | Admitting: Internal Medicine

## 2023-08-01 ENCOUNTER — Ambulatory Visit
Admission: RE | Admit: 2023-08-01 | Discharge: 2023-08-01 | Disposition: A | Discharge status: Home / Self Care | Source: Ambulatory Visit | Attending: Internal Medicine | Admitting: Internal Medicine

## 2023-08-01 DIAGNOSIS — R932 Abnormal findings on diagnostic imaging of liver and biliary tract: Secondary | ICD-10-CM | POA: Diagnosis not present

## 2023-08-01 DIAGNOSIS — K7030 Alcoholic cirrhosis of liver without ascites: Secondary | ICD-10-CM | POA: Diagnosis not present

## 2023-08-01 DIAGNOSIS — R7401 Elevation of levels of liver transaminase levels: Secondary | ICD-10-CM | POA: Diagnosis not present

## 2023-08-01 DIAGNOSIS — K7460 Unspecified cirrhosis of liver: Secondary | ICD-10-CM | POA: Diagnosis not present

## 2023-08-01 NOTE — Telephone Encounter (Signed)
 noted

## 2023-08-04 MED ORDER — DENOSUMAB 60 MG/ML ~~LOC~~ SOSY
60.0000 mg | PREFILLED_SYRINGE | Freq: Once | SUBCUTANEOUS | Status: AC
  Administered 2023-10-28: 60 mg via SUBCUTANEOUS

## 2023-08-04 NOTE — Addendum Note (Signed)
 Addended by: Warden Fillers on: 08/04/2023 08:40 PM   Modules accepted: Orders

## 2023-08-06 ENCOUNTER — Telehealth: Payer: Self-pay

## 2023-08-06 ENCOUNTER — Encounter: Payer: Self-pay | Admitting: Family Medicine

## 2023-08-06 ENCOUNTER — Ambulatory Visit: Admitting: Family Medicine

## 2023-08-06 VITALS — BP 130/74 | HR 101 | Temp 98.0°F | Resp 20 | Ht 72.0 in | Wt 158.5 lb

## 2023-08-06 DIAGNOSIS — R195 Other fecal abnormalities: Secondary | ICD-10-CM

## 2023-08-06 DIAGNOSIS — N401 Enlarged prostate with lower urinary tract symptoms: Secondary | ICD-10-CM | POA: Diagnosis not present

## 2023-08-06 DIAGNOSIS — R350 Frequency of micturition: Secondary | ICD-10-CM

## 2023-08-06 LAB — POCT URINALYSIS DIPSTICK (MANUAL)
Leukocytes, UA: NEGATIVE
Nitrite, UA: NEGATIVE
Poct Bilirubin: NEGATIVE
Poct Glucose: NORMAL mg/dL
Poct Ketones: NEGATIVE
Poct Urobilinogen: NORMAL mg/dL
Spec Grav, UA: 1.025 (ref 1.010–1.025)
pH, UA: 5.5 (ref 5.0–8.0)

## 2023-08-06 MED ORDER — TAMSULOSIN HCL 0.4 MG PO CAPS: 0.8000 mg | ORAL_CAPSULE | Freq: Every day | ORAL | 1 refills | Status: DC

## 2023-08-06 MED ORDER — LOPERAMIDE HCL 2 MG PO CAPS: 2.0000 mg | ORAL_CAPSULE | ORAL | 0 refills | Status: DC | PRN

## 2023-08-06 NOTE — Patient Instructions (Addendum)
 It was a pleasure meeting you today. Thank you for allowing me to take part in your health care.  Our goals for today as we discussed include:  Side effect of Zoloft can be diarrhea.  Please follow up PCP to discuss other options  Can take Imodium for intermittent diarrhea  Urine did not show infection  Increase Flomax to 2 tablets daily.  If no improvement follow up with PCP   This is a list of the screening recommended for you and due dates:  Health Maintenance  Topic Date Due   COVID-19 Vaccine (4 - 2024-25 season) 01/27/2023   Colon Cancer Screening  04/25/2023   Flu Shot  08/26/2023*   Medicare Annual Wellness Visit  07/02/2024   DTaP/Tdap/Td vaccine (5 - Td or Tdap) 06/07/2027   Pneumonia Vaccine  Completed   HPV Vaccine  Aged Out   Hepatitis C Screening  Discontinued   Zoster (Shingles) Vaccine  Discontinued  *Topic was postponed. The date shown is not the original due date.      If you have any questions or concerns, please do not hesitate to call the office at [phone].  I look forward to our next visit and until then take care and stay safe.  Regards,   Dana Allan, MD   [facility]

## 2023-08-06 NOTE — Progress Notes (Unsigned)
 SUBJECTIVE:   Chief Complaint  Patient presents with   Urinary Incontinence   HPI Presents for acute visit  Discussed the use of AI scribe software for clinical note transcription with the patient, who gave verbal consent to proceed.  History of Present Illness He is an 83 year old with urinary incontinence and bowel irregularities who presents with urinary urgency and bowel urgency.  He has been experiencing urinary incontinence and frequent urges to urinate for the past couple of months. The sensation is described as a frequent urge to urinate, but with less output than expected. If he does not urinate when he feels the urge, leakage occurs. He has been on Flomax since 2015 or 2016, taking one tablet daily, initially prescribed following back surgery. He believes he has an enlarged prostate and mentioned this to a urologist in November, but no further evaluation was done at that time. No fever, abdominal pain, or hematuria.  He also experiences bowel urgency with sudden urges to defecate, occurring for a couple of months. Bowel movements are described as 'semi-diarrhea' occurring once a week on average, sometimes more frequently. No constipation, blood in stool, or fever. Episodes can occur 20-30 minutes after eating, potentially triggered by certain foods, though specific triggers have not been identified. Regular bowel movements typically occur mid-morning, with no significant changes in stool consistency or frequency outside of these episodes.  His fluid intake is likely insufficient, as he does not have a strong thirst, but he tries to drink more water. He consumes alcohol, typically a glass or two of Chardonnay at lunch and occasionally in the evening. He has discontinued Ambien, substituting it with melatonin, and only uses tramadol as needed for pain, which he rarely experiences. He takes doxycycline occasionally for tick exposure, but has not used it in months. He also uses Klonopin  for anxiety, though he recently ran out and did not notice. He is unsure about his use of Arimidex, which he does not recall taking.      PERTINENT PMH / PSH: As above  OBJECTIVE:  BP 130/74   Pulse (!) 101   Temp 98 F (36.7 C)   Resp 20   Ht 6' (1.829 m)   Wt 158 lb 8 oz (71.9 kg)   SpO2 97%   BMI 21.50 kg/m    Physical Exam Vitals reviewed.  Constitutional:      General: He is not in acute distress.    Appearance: Normal appearance. He is normal weight. He is not ill-appearing, toxic-appearing or diaphoretic.  Eyes:     General:        Right eye: No discharge.        Left eye: No discharge.  Cardiovascular:     Rate and Rhythm: Normal rate and regular rhythm.     Heart sounds: Normal heart sounds.  Pulmonary:     Effort: Pulmonary effort is normal.     Breath sounds: Normal breath sounds.  Abdominal:     General: Abdomen is flat. Bowel sounds are normal. There is no distension.     Palpations: Abdomen is soft.     Tenderness: There is no abdominal tenderness. There is no right CVA tenderness, left CVA tenderness or guarding.  Musculoskeletal:        General: Normal range of motion.     Cervical back: Normal range of motion.  Skin:    General: Skin is warm and dry.  Neurological:     Mental Status: He is alert  and oriented to person, place, and time. Mental status is at baseline.  Psychiatric:        Mood and Affect: Mood normal.        Behavior: Behavior normal.        Thought Content: Thought content normal.        Judgment: Judgment normal.           08/06/2023    9:29 AM 07/03/2023    9:43 AM 03/22/2023   10:30 AM 07/02/2022   11:04 AM 02/02/2022    9:01 AM  Depression screen PHQ 2/9  Decreased Interest 0 0 0 0 0  Down, Depressed, Hopeless 0 0 1 0 0  PHQ - 2 Score 0 0 1 0 0  Altered sleeping 0 2 1  0  Tired, decreased energy 0 0 0  0  Change in appetite 0  0  0  Feeling bad or failure about yourself  0 0 0  0  Trouble concentrating 0 0 0  0   Moving slowly or fidgety/restless 0 0 0  0  Suicidal thoughts 0 0 0  0  PHQ-9 Score 0 2 2  0  Difficult doing work/chores Not difficult at all Not difficult at all Not difficult at all  Not difficult at all      08/06/2023    9:30 AM 03/22/2023   10:31 AM  GAD 7 : Generalized Anxiety Score  Nervous, Anxious, on Edge 1 1  Control/stop worrying 0 0  Worry too much - different things 0 0  Trouble relaxing 0 1  Restless 0 0  Easily annoyed or irritable 0 1  Afraid - awful might happen 0 0  Total GAD 7 Score 1 3  Anxiety Difficulty Somewhat difficult Somewhat difficult    ASSESSMENT/PLAN:  Benign prostatic hyperplasia with urinary frequency Assessment & Plan: Symptoms likely due to incomplete bladder emptying from enlarged prostate. Current Flomax dosage may be insufficient. - Order urinalysis to rule out urinary tract infection. - Increase Flomax 0.8 mg daily - Follow up with urologist if symptoms persist.  Orders: -     POCT Urinalysis Dip Manual  Loose stools Assessment & Plan: Possible overflow diarrhea or dietary triggers. Imodium may provide relief. -Start Imodium for symptomatic relief. - Monitor dietary intake to identify potential triggers. - Consider referral to a gastroenterologist if symptoms persist.  Orders: -     Loperamide HCl; Take 1 capsule (2 mg total) by mouth as needed for diarrhea or loose stools.  Dispense: 30 capsule; Refill: 0     PDMP reviewed  Return in about 7 weeks (around 09/23/2023) for PCP.  Dana Allan, MD

## 2023-08-06 NOTE — Telephone Encounter (Signed)
 Copied from CRM [id number]. Topic: Clinical - Medication Question >> Aug 06, 2023 10:51 AM Deaijah H wrote: Reason for CRM: [facility]  called in to very tamsulosin (FLOMAX) 0.4 MG CAPS capsule descriptions. Would like to know if its 2 by mouth daily or 1 cap by mouth once daily. Please [phone]

## 2023-08-07 ENCOUNTER — Telehealth (INDEPENDENT_AMBULATORY_CARE_PROVIDER_SITE_OTHER): Admitting: Internal Medicine

## 2023-08-07 ENCOUNTER — Other Ambulatory Visit: Payer: Self-pay | Admitting: Family Medicine

## 2023-08-07 ENCOUNTER — Encounter: Payer: Self-pay | Admitting: Internal Medicine

## 2023-08-07 VITALS — BP 116/77 | Ht 72.0 in | Wt 158.5 lb

## 2023-08-07 DIAGNOSIS — N401 Enlarged prostate with lower urinary tract symptoms: Secondary | ICD-10-CM

## 2023-08-07 DIAGNOSIS — R2689 Other abnormalities of gait and mobility: Secondary | ICD-10-CM

## 2023-08-07 DIAGNOSIS — R030 Elevated blood-pressure reading, without diagnosis of hypertension: Secondary | ICD-10-CM

## 2023-08-07 DIAGNOSIS — E039 Hypothyroidism, unspecified: Secondary | ICD-10-CM

## 2023-08-07 DIAGNOSIS — N138 Other obstructive and reflux uropathy: Secondary | ICD-10-CM | POA: Diagnosis not present

## 2023-08-07 DIAGNOSIS — R195 Other fecal abnormalities: Secondary | ICD-10-CM | POA: Insufficient documentation

## 2023-08-07 MED ORDER — TAMSULOSIN HCL 0.4 MG PO CAPS: 0.8000 mg | ORAL_CAPSULE | Freq: Every day | ORAL | 1 refills | Status: DC

## 2023-08-07 NOTE — Assessment & Plan Note (Addendum)
 He has microalbuminuria and a CORRECTED alb/cr ratio of 64 based on  Feb  2024 test  and has declined use of ARB/ACE I in the past.   Lab Results  Component Value Date   LABMICR See below: 04/23/2022   LABMICR See below: 07/14/2021   MICROALBUR 5.8 (H) 07/02/2022   MICROALBUR 3.0 (H) 07/31/2021

## 2023-08-07 NOTE — Assessment & Plan Note (Signed)
 No fevers  flushing/  Likely food related (lactose intolerance)  or secondary to sertraline.  Advised to suspend sertraline during vacation

## 2023-08-07 NOTE — Patient Instructions (Addendum)
 Suspend zoloft for your trip since it may be causing the diarrhea   Take imodium and lactase (otc pill for lactose intolerance) with you on trip  If diarrhea  resolves,  stay off the zoloft  Agree with doubling the dose of Flomax but schedule appt with [facility] when you get back   Take a  B12 supplement daily  2500 mcg daily ;  we will check your level when you get back

## 2023-08-07 NOTE — Assessment & Plan Note (Signed)
 Advised to increasd Flomax to 0.8 mg daily and  follow up with Urology

## 2023-08-07 NOTE — Telephone Encounter (Signed)
 Returned call to [place] at [facility] to provide clarification of Tamsulosin prescription. Joni Reining verbalized understanding.

## 2023-08-07 NOTE — Progress Notes (Signed)
 Virtual Visit via Caregility   Note   This format is felt to be most appropriate for this patient at this time.  All issues noted in this document were discussed and addressed.  No physical exam was performed (except for noted visual exam findings with Video Visits).   I connected with  Tony Luna  on 08/07/23 at 10:30 AM EDT by a video enabled telemedicine application and verified that I am speaking with the correct person using two identifiers. Location patient: home Location provider: work or home office Persons participating in the virtual visit: patient, provider  I discussed the limitations, risks, security and privacy concerns of performing an evaluation and management service by telephone and the availability of in person appointments. I also discussed with the patient that there may be a patient responsible charge related to this service. The patient expressed understanding and agreed to proceed.   Reason for visit: discuss medications  HPI:  83 yr old male with h/o cirrhosis,  OA,  BPH with LUTS managed with Flomax 0.4 mg daily ,  depression /GAD managed with Zoloft seen yesterday by TW for lower urinary tract symptoms and loose stools   He has voiding small volumes with increased frequency, without dysuria, nausea and fevers for the last several months.  Last appt with urology was Nov 2024   symptoms were present but not as bothersome then.   Loose stools:  has a normal BM every morning, then averages 2 loose stools during the day, with fecal urgency , usually about 30 minutes after eating   Balance has been off ,  but also has a  history of right leg weakness due to a quad tear.  He notes that his knee buckles has had some near falls    ROS: See pertinent positives and negatives per HPI.  Past Medical History:  Diagnosis Date   Allergy    Arthritis    Cirrhosis (HCC)    Complication of anesthesia    :"need catheter"   Dysrhythmia    ? something told by reg dr  unable to say  what it was   Esophageal tear    Hypothyroidism    Pneumonia    hx   Primary osteoarthritis of left knee 06/22/2014   Rotator cuff tear     Past Surgical History:  Procedure Laterality Date   BACK SURGERY     blepharaplasty     CATARACT EXTRACTION W/PHACO Left 02/21/2021   Procedure: CATARACT EXTRACTION PHACO AND INTRAOCULAR LENS PLACEMENT (IOC) LEFT 4.56 00:34.9;  Surgeon: Galen Manila, MD;  Location: [facility];  Service: Ophthalmology;  Laterality: Left;   CAUTERY OF TURBINATES     COLONOSCOPY WITH PROPOFOL N/A 07/19/2015   Procedure: COLONOSCOPY WITH PROPOFOL;  Surgeon: Midge Minium, MD;  Location: [facility];  Service: Endoscopy;  Laterality: N/A;   COLONOSCOPY WITH PROPOFOL N/A 03/21/2021   Procedure: COLONOSCOPY WITH PROPOFOL;  Surgeon: Midge Minium, MD;  Location: [facility];  Service: Endoscopy;  Laterality: N/A;   COLONOSCOPY WITH PROPOFOL N/A 04/18/2021   Procedure: COLONOSCOPY WITH PROPOFOL;  Surgeon: Midge Minium, MD;  Location: [facility] ENDOSCOPY;  Service: Endoscopy;  Laterality: N/A;   COLONOSCOPY WITH PROPOFOL N/A 04/24/2022   Procedure: COLONOSCOPY WITH PROPOFOL;  Surgeon: Midge Minium, MD;  Location: [facility] ENDOSCOPY;  Service: Endoscopy;  Laterality: N/A;   EYE SURGERY     HERNIA REPAIR Left    inguinal herniorrhapy   KNEE SURGERY     ? loose body/ chondral defect 35yrs  ROTATOR CUFF REPAIR     left   SPINE SURGERY     TONSILLECTOMY     TOTAL KNEE ARTHROPLASTY Left 06/22/2014   Procedure: TOTAL KNEE ARTHROPLASTY;  Surgeon: Eulas Post, MD;  Location: [facility] OR;  Service: Orthopedics;  Laterality: Left;    Family History  Problem Relation Age of Onset   Alcohol abuse Mother    Diabetes Mother    Mental retardation Mother     SOCIAL HX:  Social Connections: Moderately Integrated (07/03/2023)   Social Connection and Isolation Panel [NHANES]    Frequency of Communication with Friends and Family: More than three times a week    Frequency of  Social Gatherings with Friends and Family: Twice a week    Attends Religious Services: Never    Database administrator or Organizations: Yes    Attends Engineer, structural: More than 4 times per year    Marital Status: Married      Current Outpatient Medications:    Boron 6 MG TABS, Take by mouth daily., Disp: , Rfl:    loperamide (IMODIUM) 2 MG capsule, Take 1 capsule (2 mg total) by mouth as needed for diarrhea or loose stools., Disp: 30 capsule, Rfl: 0   Multiple Vitamin (MULTIVITAMIN WITH MINERALS) TABS tablet, Take 1 tablet by mouth 2 (two) times daily., Disp: , Rfl:    NP THYROID 60 MG tablet, TAKE ONE TABLET BY MOUTH EVERY DAY, Disp: 90 tablet, Rfl: 1   OVER THE COUNTER MEDICATION, Take 1 capsule by mouth 3 (three) times daily. Instaflex, Disp: , Rfl:    OVER THE COUNTER MEDICATION, Take 250 mg by mouth daily. Bio Magnesium Citrate, Disp: , Rfl:    OVER THE COUNTER MEDICATION, Take 20,000 mcg by mouth daily. Vitamin B12-methylfolate, Disp: , Rfl:    OVER THE COUNTER MEDICATION, Take 800 mcg by mouth daily. Active B Folate, Disp: , Rfl:    OVER THE COUNTER MEDICATION, 2 tablets daily. VitaPrime, Disp: , Rfl:    OVER THE COUNTER MEDICATION, 1,000 mg daily. Super Bio-C buffered, Disp: , Rfl:    OVER THE COUNTER MEDICATION, 1,000 mg daily. L'Arginine, Disp: , Rfl:    Saw Palmetto 160 MG TABS, daily in the afternoon., Disp: , Rfl:    sertraline (ZOLOFT) 50 MG tablet, TAKE ONE TABLET BY MOUTH DAILY, Disp: 30 tablet, Rfl: 3   sildenafil (VIAGRA) 50 MG tablet, USE AS DIRECTED, Disp: 90 tablet, Rfl: 0   tadalafil (CIALIS) 20 MG tablet, TAKE ONE TABLET DAILY AS NEEDED FOR ERECTILE DYSFUNCTION, Disp: 240 tablet, Rfl: 0   tamsulosin (FLOMAX) 0.4 MG CAPS capsule, Take 2 capsules (0.8 mg total) by mouth daily., Disp: 90 capsule, Rfl: 1   Testosterone 75 MG PLLT, by Implant route., Disp: , Rfl:    traMADol (ULTRAM) 50 MG tablet, TAKE ONE TABLET EVERY 6 HOURS AS NEEDED FOR PAIN, Disp: 28  tablet, Rfl: 0  Current Facility-Administered Medications:    [START ON 09/20/2023] denosumab (PROLIA) injection 60 mg, 60 mg, Subcutaneous, Once, Darrick Huntsman, Mar Daring, MD  EXAM:  VITALS per patient if applicable:  GENERAL: alert, oriented, appears well and in no acute distress  HEENT: atraumatic, conjunttiva clear, no obvious abnormalities on inspection of external nose and ears  NECK: normal movements of the head and neck  LUNGS: on inspection no signs of respiratory distress, breathing rate appears normal, no obvious gross SOB, gasping or wheezing  CV: no obvious cyanosis  MS: moves all visible extremities without noticeable  abnormality  PSYCH/NEURO: pleasant and cooperative, no obvious depression or anxiety, speech and thought processing grossly intact  ASSESSMENT AND PLAN: White coat syndrome with high blood pressure without hypertension Assessment & Plan: He has microalbuminuria and a CORRECTED alb/cr ratio of 64 based on  Feb  2024 test  and has declined use of ARB/ACE I in the past.   Lab Results  Component Value Date   LABMICR See below: 04/23/2022   LABMICR See below: 07/14/2021   MICROALBUR 5.8 (H) 07/02/2022   MICROALBUR 3.0 (H) 07/31/2021       Orders: -     Comprehensive metabolic panel; Future -     Lipid panel; Future  Loss of balance -     B12 and Folate Panel; Future  Acquired hypothyroidism -     TSH; Future  Loose stools Assessment & Plan: No fevers  flushing/  Likely food related (lactose intolerance)  or secondary to sertraline.  Advised to suspend sertraline during vacation    Benign prostatic hyperplasia with urinary obstruction Assessment & Plan: Advised to increasd Flomax to 0.8 mg daily and  follow up with Urology        I discussed the assessment and treatment plan with the patient. The patient was provided an opportunity to ask questions and all were answered. The patient agreed with the plan and demonstrated an understanding of the  instructions.   The patient was advised to call back or seek an in-person evaluation if the symptoms worsen or if the condition fails to improve as anticipated.    Sherlene Shams, MD

## 2023-08-08 DIAGNOSIS — Z09 Encounter for follow-up examination after completed treatment for conditions other than malignant neoplasm: Secondary | ICD-10-CM | POA: Diagnosis not present

## 2023-08-08 DIAGNOSIS — Z872 Personal history of diseases of the skin and subcutaneous tissue: Secondary | ICD-10-CM | POA: Diagnosis not present

## 2023-08-08 DIAGNOSIS — D2261 Melanocytic nevi of right upper limb, including shoulder: Secondary | ICD-10-CM | POA: Diagnosis not present

## 2023-08-08 DIAGNOSIS — D2272 Melanocytic nevi of left lower limb, including hip: Secondary | ICD-10-CM | POA: Diagnosis not present

## 2023-08-08 DIAGNOSIS — Z08 Encounter for follow-up examination after completed treatment for malignant neoplasm: Secondary | ICD-10-CM | POA: Diagnosis not present

## 2023-08-08 DIAGNOSIS — D692 Other nonthrombocytopenic purpura: Secondary | ICD-10-CM | POA: Diagnosis not present

## 2023-08-08 DIAGNOSIS — L821 Other seborrheic keratosis: Secondary | ICD-10-CM | POA: Diagnosis not present

## 2023-08-08 DIAGNOSIS — Z85828 Personal history of other malignant neoplasm of skin: Secondary | ICD-10-CM | POA: Diagnosis not present

## 2023-08-08 DIAGNOSIS — D225 Melanocytic nevi of trunk: Secondary | ICD-10-CM | POA: Diagnosis not present

## 2023-08-08 DIAGNOSIS — L570 Actinic keratosis: Secondary | ICD-10-CM | POA: Diagnosis not present

## 2023-08-08 DIAGNOSIS — D2262 Melanocytic nevi of left upper limb, including shoulder: Secondary | ICD-10-CM | POA: Diagnosis not present

## 2023-08-08 DIAGNOSIS — D2271 Melanocytic nevi of right lower limb, including hip: Secondary | ICD-10-CM | POA: Diagnosis not present

## 2023-08-09 ENCOUNTER — Encounter: Payer: Self-pay | Admitting: Family Medicine

## 2023-08-09 DIAGNOSIS — R350 Frequency of micturition: Secondary | ICD-10-CM | POA: Insufficient documentation

## 2023-08-09 DIAGNOSIS — N401 Enlarged prostate with lower urinary tract symptoms: Secondary | ICD-10-CM | POA: Insufficient documentation

## 2023-08-09 NOTE — Assessment & Plan Note (Deleted)
 Symptoms likely due to incomplete bladder emptying from enlarged prostate. Current Flomax dosage may be insufficient. - Order urinalysis to rule out urinary tract infection. - Increase Flomax 0.8 mg daily - Follow up with urologist if symptoms persist.

## 2023-08-09 NOTE — Assessment & Plan Note (Signed)
 Possible overflow diarrhea or dietary triggers. Imodium may provide relief. -Start Imodium for symptomatic relief. - Monitor dietary intake to identify potential triggers. - Consider referral to a gastroenterologist if symptoms persist.

## 2023-08-09 NOTE — Assessment & Plan Note (Signed)
 Symptoms likely due to incomplete bladder emptying from enlarged prostate. Current Flomax dosage may be insufficient. - Order urinalysis to rule out urinary tract infection. - Increase Flomax 0.8 mg daily - Follow up with urologist if symptoms persist.

## 2023-08-15 ENCOUNTER — Encounter: Payer: Self-pay | Admitting: Internal Medicine

## 2023-08-28 ENCOUNTER — Encounter: Payer: Self-pay | Admitting: *Deleted

## 2023-09-03 DIAGNOSIS — R030 Elevated blood-pressure reading, without diagnosis of hypertension: Secondary | ICD-10-CM | POA: Diagnosis not present

## 2023-09-03 DIAGNOSIS — L309 Dermatitis, unspecified: Secondary | ICD-10-CM | POA: Diagnosis not present

## 2023-09-09 DIAGNOSIS — L309 Dermatitis, unspecified: Secondary | ICD-10-CM | POA: Diagnosis not present

## 2023-09-09 DIAGNOSIS — Z09 Encounter for follow-up examination after completed treatment for conditions other than malignant neoplasm: Secondary | ICD-10-CM | POA: Diagnosis not present

## 2023-09-23 ENCOUNTER — Encounter: Payer: Self-pay | Admitting: Internal Medicine

## 2023-09-23 ENCOUNTER — Ambulatory Visit (INDEPENDENT_AMBULATORY_CARE_PROVIDER_SITE_OTHER): Payer: PPO | Admitting: Internal Medicine

## 2023-09-23 VITALS — BP 124/66 | HR 94 | Ht 72.0 in | Wt 160.0 lb

## 2023-09-23 DIAGNOSIS — R030 Elevated blood-pressure reading, without diagnosis of hypertension: Secondary | ICD-10-CM | POA: Diagnosis not present

## 2023-09-23 DIAGNOSIS — N138 Other obstructive and reflux uropathy: Secondary | ICD-10-CM

## 2023-09-23 DIAGNOSIS — R2689 Other abnormalities of gait and mobility: Secondary | ICD-10-CM | POA: Insufficient documentation

## 2023-09-23 DIAGNOSIS — Z636 Dependent relative needing care at home: Secondary | ICD-10-CM | POA: Diagnosis not present

## 2023-09-23 DIAGNOSIS — D696 Thrombocytopenia, unspecified: Secondary | ICD-10-CM | POA: Diagnosis not present

## 2023-09-23 DIAGNOSIS — M818 Other osteoporosis without current pathological fracture: Secondary | ICD-10-CM | POA: Diagnosis not present

## 2023-09-23 DIAGNOSIS — N401 Enlarged prostate with lower urinary tract symptoms: Secondary | ICD-10-CM | POA: Diagnosis not present

## 2023-09-23 DIAGNOSIS — L98491 Non-pressure chronic ulcer of skin of other sites limited to breakdown of skin: Secondary | ICD-10-CM | POA: Diagnosis not present

## 2023-09-23 DIAGNOSIS — E039 Hypothyroidism, unspecified: Secondary | ICD-10-CM | POA: Diagnosis not present

## 2023-09-23 DIAGNOSIS — R7301 Impaired fasting glucose: Secondary | ICD-10-CM | POA: Diagnosis not present

## 2023-09-23 DIAGNOSIS — E785 Hyperlipidemia, unspecified: Secondary | ICD-10-CM

## 2023-09-23 DIAGNOSIS — Z1211 Encounter for screening for malignant neoplasm of colon: Secondary | ICD-10-CM

## 2023-09-23 LAB — COMPREHENSIVE METABOLIC PANEL WITH GFR
ALT: 66 U/L — ABNORMAL HIGH (ref 0–53)
AST: 86 U/L — ABNORMAL HIGH (ref 0–37)
Albumin: 4 g/dL (ref 3.5–5.2)
Alkaline Phosphatase: 134 U/L — ABNORMAL HIGH (ref 39–117)
BUN: 12 mg/dL (ref 6–23)
CO2: 27 meq/L (ref 19–32)
Calcium: 9.3 mg/dL (ref 8.4–10.5)
Chloride: 102 meq/L (ref 96–112)
Creatinine, Ser: 0.78 mg/dL (ref 0.40–1.50)
GFR: 83.98 mL/min (ref >60.00)
Glucose, Bld: 111 mg/dL — ABNORMAL HIGH (ref 70–99)
Potassium: 4.1 meq/L (ref 3.5–5.1)
Sodium: 140 meq/L (ref 135–145)
Total Bilirubin: 1.6 mg/dL — ABNORMAL HIGH (ref 0.2–1.2)
Total Protein: 6.7 g/dL (ref 6.0–8.3)

## 2023-09-23 LAB — CBC WITH DIFFERENTIAL/PLATELET
Basophils Absolute: 0 10*3/uL (ref 0.0–0.1)
Basophils Relative: 1.3 % (ref 0.0–3.0)
Eosinophils Absolute: 0.1 10*3/uL (ref 0.0–0.7)
Eosinophils Relative: 2.6 % (ref 0.0–5.0)
HCT: 35.5 % — ABNORMAL LOW (ref 39.0–52.0)
Hemoglobin: 12.4 g/dL — ABNORMAL LOW (ref 13.0–17.0)
Lymphocytes Relative: 19.1 % (ref 12.0–46.0)
Lymphs Abs: 0.7 10*3/uL (ref 0.7–4.0)
MCHC: 35 g/dL (ref 30.0–36.0)
MCV: 103.9 fl — ABNORMAL HIGH (ref 78.0–100.0)
Monocytes Absolute: 0.4 10*3/uL (ref 0.1–1.0)
Monocytes Relative: 10.1 % (ref 3.0–12.0)
Neutro Abs: 2.5 10*3/uL (ref 1.4–7.7)
Neutrophils Relative %: 66.9 % (ref 43.0–77.0)
Platelets: 137 10*3/uL — ABNORMAL LOW (ref 150.0–400.0)
RBC: 3.42 Mil/uL — ABNORMAL LOW (ref 4.22–5.81)
RDW: 12.4 % (ref 11.5–15.5)
WBC: 3.8 10*3/uL — ABNORMAL LOW (ref 4.0–10.5)

## 2023-09-23 LAB — LIPID PANEL
Cholesterol: 202 mg/dL — ABNORMAL HIGH (ref 0–200)
HDL: 106.6 mg/dL (ref >39.00)
LDL Cholesterol: 84 mg/dL (ref 0–99)
NonHDL: 95.86
Total CHOL/HDL Ratio: 2
Triglycerides: 61 mg/dL (ref 0.0–149.0)
VLDL: 12.2 mg/dL (ref 0.0–40.0)

## 2023-09-23 LAB — LDL CHOLESTEROL, DIRECT: Direct LDL: 74 mg/dL

## 2023-09-23 LAB — B12 AND FOLATE PANEL
Folate: 19.4 ng/mL (ref >5.9)
Vitamin B-12: 557 pg/mL (ref 211–911)

## 2023-09-23 LAB — TSH: TSH: 2 u[IU]/mL (ref 0.35–5.50)

## 2023-09-23 LAB — HEMOGLOBIN A1C: Hgb A1c MFr Bld: 4.4 % — ABNORMAL LOW (ref 4.6–6.5)

## 2023-09-23 NOTE — Assessment & Plan Note (Signed)
 Stable,  Likely secondary to cirrhosis and portal hypertension   Lab Results  Component Value Date   WBC 3.8 (L) 09/23/2023   HGB 12.4 (L) 09/23/2023   HCT 35.5 (L) 09/23/2023   MCV 103.9 (H) 09/23/2023   PLT 137.0 (L) 09/23/2023

## 2023-09-23 NOTE — Patient Instructions (Signed)
 The ulcer on your buttock will heal faster if you can allow some airflow ; try placing a soft square of gauze over it   If no resolution in 1 month, let me know

## 2023-09-23 NOTE — Assessment & Plan Note (Signed)
 Managed with  Testosterone  pellets done by [facility] MD and Prolia  since  Jan 2021. He did not receive his scheduled Prolia  injection today because he did not bring his wallet

## 2023-09-23 NOTE — Assessment & Plan Note (Signed)
 He has microalbuminuria and a CORRECTED alb/cr ratio of 64 based on  Feb  2024 test  ; but he has declined use of ARB/ACE  in the past.   Lab Results  Component Value Date   LABMICR See below: 04/23/2022   LABMICR See below: 07/14/2021   MICROALBUR 5.8 (H) 07/02/2022   MICROALBUR 3.0 (H) 07/31/2021

## 2023-09-23 NOTE — Progress Notes (Signed)
 Subjective:  Patient ID: Tony Luna, male    DOB: 1940-11-02  Age: 83 y.o. MRN: [id number]  CC: The primary encounter diagnosis was Colon cancer screening. 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   HPI ZACHERY FRANKLYN presents for  Chief Complaint  Patient presents with   Medical Management of Chronic Issues    6 month follow up     1) BPH with  LUTS;  at his visit one month ago I advised him to increase his dose of Flomax  to 0.8 mg and follow up with Urology.  He notes no improvement with increased dose of Flomax , but has deferred Urology follow up.    2) GAD:  has stopped sertraline  , feels  no change  without it .  Having the most trouble with Ronny Colas is with him.   Judith stays in [place]  and in [place] to be with grandchildren  and sarah respectively.  Stays with him a few days per month and the interaction is strained because she becomes tearful , agitated . She has a Therapist, sports  in [place].Ronny Colas is undergoing treatment for BRCA   3) Post prandial loose stools : continues to happen sporadically, not daily    4) balance issues:  attributes to weak quad and knee issues  has been restricted from exercising due to Judith's needs .  5) cc:  nonhealing "sore " on right buttock for the past month; states it is painful and  has not responded to steroid and lidocaine  ointment given to him by [facility]. 2-3 weeks ago  right buttock near the natal cleft.   Outpatient Medications Prior to Visit  Medication Sig Dispense Refill   Boron 6 MG TABS Take by mouth daily.      clotrimazole-betamethasone (LOTRISONE) cream Apply topically.     loperamide  (IMODIUM ) 2 MG capsule Take 1 capsule (2 mg total) by mouth as needed for diarrhea or loose stools. 30 capsule 0   Multiple Vitamin (MULTIVITAMIN WITH MINERALS) TABS tablet Take 1 tablet by mouth 2 (two) times daily.     mupirocin ointment (BACTROBAN) 2 % SMARTSIG:1 Application Topical 2-3 Times Daily     NP THYROID  60 MG tablet TAKE ONE TABLET BY MOUTH EVERY DAY 90 tablet 1   OVER THE COUNTER MEDICATION Take 1 capsule by mouth 3 (three) times daily. Instaflex     OVER THE COUNTER MEDICATION Take 250 mg by mouth daily. Bio Magnesium  Citrate     OVER THE COUNTER MEDICATION Take 20,000 mcg by mouth daily. Vitamin B12-methylfolate     OVER THE COUNTER MEDICATION Take 800 mcg by mouth daily. Active B Folate     OVER THE COUNTER MEDICATION 2 tablets daily. VitaPrime     OVER THE COUNTER MEDICATION 1,000 mg daily. Super Bio-C buffered     OVER THE COUNTER MEDICATION 1,000 mg daily. L'Arginine     Saw Palmetto 160 MG TABS daily in the afternoon.     sertraline  (ZOLOFT ) 50 MG tablet TAKE ONE TABLET BY MOUTH DAILY 30 tablet 3   sildenafil  (VIAGRA ) 50 MG tablet USE AS DIRECTED 90 tablet 0   tadalafil  (CIALIS ) 20 MG tablet TAKE ONE TABLET DAILY AS NEEDED  FOR ERECTILE DYSFUNCTION 240 tablet 0   tamsulosin  (FLOMAX ) 0.4 MG CAPS capsule Take 2 capsules (0.8 mg total) by mouth daily. 90 capsule 1   Testosterone  75 MG PLLT by Implant route.     traMADol  (ULTRAM ) 50 MG tablet TAKE ONE TABLET EVERY 6 HOURS AS NEEDED FOR PAIN 28 tablet 0   Facility-Administered Medications Prior to Visit  Medication Dose Route Frequency Provider Last Rate Last Admin   denosumab  (PROLIA ) injection 60 mg  60 mg Subcutaneous Once Tevis Dunavan L, MD        Review of Systems;  Patient denies headache, fevers, malaise, unintentional weight loss, skin rash, eye pain, sinus congestion and sinus pain, sore throat, dysphagia,  hemoptysis , cough, dyspnea,  wheezing, chest pain, palpitations, orthopnea, edema, abdominal pain, nausea, melena, diarrhea, constipation, flank pain, dysuria, hematuria, urinary  Frequency, nocturia, numbness, tingling, seizures,  Focal weakness, Loss of consciousness,  Tremor, insomnia, depression, anxiety, and suicidal ideation.      Objective:  BP 124/66   Pulse 94   Ht 6' (1.829 m)   Wt 160 lb (72.6 kg)   SpO2 97%   BMI 21.70 kg/m   BP Readings from Last 3 Encounters:  09/23/23 124/66  08/07/23 116/77  08/06/23 130/74    Wt Readings from Last 3 Encounters:  09/23/23 160 lb (72.6 kg)  08/07/23 158 lb 8 oz (71.9 kg)  08/06/23 158 lb 8 oz (71.9 kg)    Physical Exam Vitals reviewed.  Constitutional:      General: He is not in acute distress.    Appearance: Normal appearance. He is normal weight. He is not ill-appearing, toxic-appearing or diaphoretic.  HENT:     Head: Normocephalic.  Eyes:     General: No scleral icterus.       Right eye: No discharge.        Left eye: No discharge.     Conjunctiva/sclera: Conjunctivae normal.  Cardiovascular:     Rate and Rhythm: Normal rate and regular rhythm.     Heart sounds: Normal heart sounds.  Pulmonary:     Effort: Pulmonary effort is normal. No respiratory distress.     Breath sounds: Normal breath sounds.  Musculoskeletal:        General: Normal range of motion.     Cervical back: Normal range of motion.  Skin:    General: Skin is warm and dry.     Findings: Wound present.          Comments: Stage 2 sacral ulcer with clean base   Neurological:     General: No focal deficit present.     Mental Status: He is alert and oriented to person, place, and time. Mental status is at baseline.  Psychiatric:        Mood and Affect: Mood normal.        Behavior: Behavior normal.        Thought Content: Thought content normal.        Judgment: Judgment normal.    Lab Results  Component Value Date   HGBA1C 4.4 (L) 09/23/2023   HGBA1C 4.7 03/22/2023    HGBA1C 4.9 07/02/2022    Lab Results  Component Value Date   CREATININE 0.78 09/23/2023   CREATININE 0.91 07/25/2023   CREATININE 1.16 03/22/2023    Lab Results  Component Value Date   WBC 3.8 (L) 09/23/2023   HGB 12.4 (L) 09/23/2023   HCT 35.5 (L) 09/23/2023   PLT 137.0 (L) 09/23/2023  GLUCOSE 111 (H) 09/23/2023   CHOL 202 (H) 09/23/2023   TRIG 61.0 09/23/2023   HDL 106.60 09/23/2023   LDLDIRECT 74.0 09/23/2023   LDLCALC 84 09/23/2023   ALT 66 (H) 09/23/2023   AST 86 (H) 09/23/2023   NA 140 09/23/2023   K 4.1 09/23/2023   CL 102 09/23/2023   CREATININE 0.78 09/23/2023   BUN 12 09/23/2023   CO2 27 09/23/2023   TSH 2.00 09/23/2023   PSA 1.65 07/02/2022   INR 1.0 11/04/2019   HGBA1C 4.4 (L) 09/23/2023   MICROALBUR 5.8 (H) 07/02/2022    US  Abdomen Limited RUQ (LIVER/GB) Result Date: 08/14/2023 CLINICAL DATA:  Cirrhosis, elevated AST EXAM: ULTRASOUND ABDOMEN LIMITED RIGHT UPPER QUADRANT COMPARISON:  Ultrasound 03/28/2023, MRI 04/06/1999 and 23 and CT 06/27/2021 FINDINGS: Gallbladder: No gallstones or wall thickening visualized. No sonographic Murphy sign noted by sonographer. Common bile duct: Diameter: 4.3 mm Liver: Heterogeneous echogenic nodular appearing liver correlate with hepatocellular disease such as cirrhosis. No parenchymal lesions no masses. No biliary dilatation. Portal vein is patent on color Doppler imaging with normal direction of blood flow towards the liver. Other: None. IMPRESSION: Heterogeneous echogenic nodular appearing liver correlate with hepatocellular disease such as cirrhosis. No parenchymal lesions no masses. No biliary dilatation. Findings appear stable since prior examination Electronically Signed   By: Fredrich Jefferson M.D.   On: 08/14/2023 21:03    Assessment & Plan:  .Colon cancer screening -     Ambulatory referral to Gastroenterology  Acquired hypothyroidism -     TSH  Hyperlipidemia, unspecified hyperlipidemia type -     Lipid panel -      LDL cholesterol, direct  Impaired fasting glucose -     Comprehensive metabolic panel with GFR -     Hemoglobin A1c  Iron overload -     CBC with Differential/Platelet  Loss of balance Assessment & Plan: Screening for b12/folate deficiencies negative.  He notes loss of quad strength since he suspended his regular exercise programs and was encouarged to resume strength training now that Ronny Colas is staying with her children  Lab Results  Component Value Date   VITAMINB12 557 09/23/2023   Lab Results  Component Value Date   FOLATE 19.4 09/23/2023     Orders: -     B12 and Folate Panel  Perianal ulcer, limited to breakdown of skin (HCC) -     HSV 2 antibody, IgG  Benign prostatic hyperplasia with urinary obstruction Assessment & Plan: Continue Flomax  0.8 mg daily ; advised to follow up    Caregiver stress Assessment & Plan:  He has discontinued sertraline  for anxiety  and declines restart.  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 He did not receive his scheduled Prolia  injection today because he did not bring his wallet    Thrombocytopenia [facility]) Assessment & Plan: Stable,  Likely secondary to cirrhosis and portal hypertension   Lab Results  Component Value Date   WBC 3.8 (L) 09/23/2023   HGB 12.4 (L) 09/23/2023   HCT 35.5 (L) 09/23/2023   MCV 103.9 (H) 09/23/2023   PLT 137.0 (L) 09/23/2023      White coat syndrome with high blood pressure without hypertension Assessment & Plan: He has microalbuminuria and a CORRECTED alb/cr ratio of 64 based on  Feb  2024 test  ; but he has declined use of ARB/ACE  in the past.   Lab Results  Component Value Date   LABMICR See below: 04/23/2022   LABMICR See below: 07/14/2021   MICROALBUR 5.8 (H) 07/02/2022    MICROALBUR 3.0 (H) 07/31/2021           I spent 34 minutes on the day of this face to face encounter reviewing patient's   prior relevant surgical and non surgical procedures, recent  labs and imaging studies, counseling on caregiver stress/living with a spouse who has dementia ,  reviewing the assessment and plan with patient, and post visit ordering and reviewing of  diagnostics and therapeutics with patient  .   Follow-up: Return in about 4 weeks (around 10/21/2023).   Thersia Flax, MD

## 2023-09-23 NOTE — Assessment & Plan Note (Addendum)
 Advised to use tramadol  prn given history of cirrhosis.

## 2023-09-23 NOTE — Assessment & Plan Note (Signed)
 Screening for b12/folate deficiencies negative.  He notes loss of quad strength since he suspended his regular exercise programs and was encouarged to resume strength training now that Ronny Colas is staying with her children  Lab Results  Component Value Date   VITAMINB12 557 09/23/2023   Lab Results  Component Value Date   FOLATE 19.4 09/23/2023

## 2023-09-23 NOTE — Assessment & Plan Note (Signed)
 He has discontinued sertraline  for anxiety  and declines restart.  Advised to avoid escalating his use of alcohol and using higher doses of clonazepam 

## 2023-09-23 NOTE — Assessment & Plan Note (Signed)
 Continue Flomax  0.8 mg daily ; advised to follow up

## 2023-09-24 ENCOUNTER — Ambulatory Visit: Admitting: Internal Medicine

## 2023-09-24 LAB — HSV 2 ANTIBODY, IGG: HSV 2 Glycoprotein G Ab, IgG: <0.9 {index}

## 2023-10-04 ENCOUNTER — Telehealth: Payer: Self-pay

## 2023-10-04 NOTE — Telephone Encounter (Signed)
 Copied from CRM [id number]. Topic: Clinical - Medication Question >> Oct 04, 2023  4:02 PM Tony Luna wrote: Reason for CRM: Patient called regarding a sore or ulcer on his buttocks. He reports using over-the-counter 5% lidocaine , which has not been effective. Patient is requesting a stronger alternative or something with a higher percentage for relief.

## 2023-10-04 NOTE — Telephone Encounter (Signed)
 Tried to reach patient by phone & left voicemail to check mychart.

## 2023-10-07 NOTE — Telephone Encounter (Signed)
 Pt scheduled an appt for 10/24/2023.

## 2023-10-24 ENCOUNTER — Ambulatory Visit: Admitting: Internal Medicine

## 2023-10-28 ENCOUNTER — Encounter: Payer: Self-pay | Admitting: Internal Medicine

## 2023-10-28 ENCOUNTER — Ambulatory Visit (INDEPENDENT_AMBULATORY_CARE_PROVIDER_SITE_OTHER): Admitting: Internal Medicine

## 2023-10-28 VITALS — BP 126/68 | HR 65 | Ht 72.0 in | Wt 161.6 lb

## 2023-10-28 DIAGNOSIS — M1731 Unilateral post-traumatic osteoarthritis, right knee: Secondary | ICD-10-CM | POA: Diagnosis not present

## 2023-10-28 DIAGNOSIS — M810 Age-related osteoporosis without current pathological fracture: Secondary | ICD-10-CM | POA: Diagnosis not present

## 2023-10-28 DIAGNOSIS — L89312 Pressure ulcer of right buttock, stage 2: Secondary | ICD-10-CM | POA: Diagnosis not present

## 2023-10-28 MED ORDER — THYROID 60 MG PO TABS: 60.0000 mg | ORAL_TABLET | Freq: Every day | ORAL | 1 refills | Status: DC

## 2023-10-28 MED ORDER — DENOSUMAB 60 MG/ML ~~LOC~~ SOSY: 60.0000 mg | PREFILLED_SYRINGE | SUBCUTANEOUS | Status: DC

## 2023-10-28 MED ORDER — CEPHALEXIN 500 MG PO CAPS: 500.0000 mg | ORAL_CAPSULE | Freq: Four times a day (QID) | ORAL | 0 refills | Status: DC

## 2023-10-28 NOTE — Assessment & Plan Note (Signed)
 HE CONTINUES TO HAVE PAIN AND INSTABILTY OF RIGHT KNEE DESPITE HOME PHYSICAL THERAPY TO IMPROVE QUAD STRENGTH .  KNEE I  S GIVING WAY AND HE FEELS UNSTABLE ON UNEVEN GROUND . Agree that future hunting trips are ill advised and should be suspended.  I have completed the from from  [facility] to support cancellation and refund of monies spent on July 5 hunting trip

## 2023-10-28 NOTE — Progress Notes (Signed)
 Subjective:  Patient ID: Tony Luna, male    DOB: 10-Apr-1941  Age: 83 y.o. MRN: [id number]  CC: The primary encounter diagnosis was Post-traumatic osteoarthritis of right knee. Diagnoses of Age-related osteoporosis without current pathological fracture and Pressure injury of right buttock, stage 2 (HCC) were also pertinent to this visit.   HPI Tony Luna presents for  Chief Complaint  Patient presents with   Medical Management of Chronic Issues    4 week follow up on skin ulcer   1) perianal ulcer :  he has been using topical lidocaine  and placing a gauze over the wound   2) nonhealing rough raised place on scalp for the past month located posterior to the crown  3) loss of balance and lower extremity weakness.  .  He has a history of meniscal tear in 2021 and has been trying to strengthen his legs since then but has had episodes of right knee instability and loss of balance on uneven ground. He is scheduled for a hunting trip  in July and does not feel capable or safe hunting with a loaded gun    Outpatient Medications Prior to Visit  Medication Sig Dispense Refill   Boron 6 MG TABS Take by mouth daily.     clotrimazole-betamethasone (LOTRISONE) cream Apply topically.     loperamide  (IMODIUM ) 2 MG capsule Take 1 capsule (2 mg total) by mouth as needed for diarrhea or loose stools. 30 capsule 0   Multiple Vitamin (MULTIVITAMIN WITH MINERALS) TABS tablet Take 1 tablet by mouth 2 (two) times daily.     mupirocin ointment (BACTROBAN) 2 % SMARTSIG:1 Application Topical 2-3 Times Daily     OVER THE COUNTER MEDICATION Take 1 capsule by mouth 3 (three) times daily. Instaflex     OVER THE COUNTER MEDICATION Take 250 mg by mouth daily. Bio Magnesium  Citrate     OVER THE COUNTER MEDICATION Take 20,000 mcg by mouth daily. Vitamin B12-methylfolate     OVER THE COUNTER MEDICATION Take 800 mcg by mouth daily. Active B Folate     OVER THE COUNTER MEDICATION 2 tablets daily. VitaPrime      OVER THE COUNTER MEDICATION 1,000 mg daily. Super Bio-C buffered     OVER THE COUNTER MEDICATION 1,000 mg daily. L'Arginine     Saw Palmetto 160 MG TABS daily in the afternoon.     sertraline  (ZOLOFT ) 50 MG tablet TAKE ONE TABLET BY MOUTH DAILY 30 tablet 3   sildenafil  (VIAGRA ) 50 MG tablet USE AS DIRECTED 90 tablet 0   tadalafil  (CIALIS ) 20 MG tablet TAKE ONE TABLET DAILY AS NEEDED FOR ERECTILE DYSFUNCTION 240 tablet 0   tamsulosin  (FLOMAX ) 0.4 MG CAPS capsule Take 2 capsules (0.8 mg total) by mouth daily. 90 capsule 1   Testosterone  75 MG PLLT by Implant route.     traMADol  (ULTRAM ) 50 MG tablet TAKE ONE TABLET EVERY 6 HOURS AS NEEDED FOR PAIN 28 tablet 0   NP THYROID  60 MG tablet TAKE ONE TABLET BY MOUTH EVERY DAY 90 tablet 1   No facility-administered medications prior to visit.    Review of Systems;  Patient denies headache, fevers, malaise, unintentional weight loss, skin rash, eye pain, sinus congestion and sinus pain, sore throat, dysphagia,  hemoptysis , cough, dyspnea, wheezing, chest pain, palpitations, orthopnea, edema, abdominal pain, nausea, melena, diarrhea, constipation, flank pain, dysuria, hematuria, urinary  Frequency, nocturia, numbness, tingling, seizures,  Focal weakness, Loss of consciousness,  Tremor, insomnia, depression, anxiety, and suicidal ideation.  Objective:  BP 126/68   Pulse 65   Ht 6' (1.829 m)   Wt 161 lb 9.6 oz (73.3 kg)   SpO2 98%   BMI 21.92 kg/m   BP Readings from Last 3 Encounters:  10/28/23 126/68  09/23/23 124/66  08/07/23 116/77    Wt Readings from Last 3 Encounters:  10/28/23 161 lb 9.6 oz (73.3 kg)  09/23/23 160 lb (72.6 kg)  08/07/23 158 lb 8 oz (71.9 kg)    Physical Exam Vitals reviewed.  Constitutional:      General: He is not in acute distress.    Appearance: Normal appearance. He is normal weight. He is not ill-appearing, toxic-appearing or diaphoretic.  HENT:     Head: Normocephalic.  Eyes:     General: No  scleral icterus.       Right eye: No discharge.        Left eye: No discharge.     Conjunctiva/sclera: Conjunctivae normal.  Cardiovascular:     Rate and Rhythm: Normal rate and regular rhythm.     Heart sounds: Normal heart sounds.  Pulmonary:     Effort: Pulmonary effort is normal. No respiratory distress.     Breath sounds: Normal breath sounds.  Musculoskeletal:        General: Normal range of motion.     Cervical back: Normal range of motion.  Skin:    General: Skin is warm and dry.     Findings: Abscess and wound present.          Comments: Wound on right buttock is crusted and dry', new wound on left buttock appears to be due to maceration.  Neurological:     General: No focal deficit present.     Mental Status: He is alert and oriented to person, place, and time. Mental status is at baseline.  Psychiatric:        Mood and Affect: Mood normal.        Behavior: Behavior normal.        Thought Content: Thought content normal.        Judgment: Judgment normal.     Lab Results  Component Value Date   HGBA1C 4.4 (L) 09/23/2023   HGBA1C 4.7 03/22/2023   HGBA1C 4.9 07/02/2022    Lab Results  Component Value Date   CREATININE 0.78 09/23/2023   CREATININE 0.91 07/25/2023   CREATININE 1.16 03/22/2023    Lab Results  Component Value Date   WBC 3.8 (L) 09/23/2023   HGB 12.4 (L) 09/23/2023   HCT 35.5 (L) 09/23/2023   PLT 137.0 (L) 09/23/2023   GLUCOSE 111 (H) 09/23/2023   CHOL 202 (H) 09/23/2023   TRIG 61.0 09/23/2023   HDL 106.60 09/23/2023   LDLDIRECT 74.0 09/23/2023   LDLCALC 84 09/23/2023   ALT 66 (H) 09/23/2023   AST 86 (H) 09/23/2023   NA 140 09/23/2023   K 4.1 09/23/2023   CL 102 09/23/2023   CREATININE 0.78 09/23/2023   BUN 12 09/23/2023   CO2 27 09/23/2023   TSH 2.00 09/23/2023   PSA 1.65 07/02/2022   INR 1.0 11/04/2019   HGBA1C 4.4 (L) 09/23/2023   MICROALBUR 5.8 (H) 07/02/2022    US  Abdomen Limited RUQ (LIVER/GB) Result Date:  08/14/2023 CLINICAL DATA:  Cirrhosis, elevated AST EXAM: ULTRASOUND ABDOMEN LIMITED RIGHT UPPER QUADRANT COMPARISON:  Ultrasound 03/28/2023, MRI 04/06/1999 and 23 and CT 06/27/2021 FINDINGS: Gallbladder: No gallstones or wall thickening visualized. No sonographic Murphy sign noted by sonographer. Common bile duct:  Diameter: 4.3 mm Liver: Heterogeneous echogenic nodular appearing liver correlate with hepatocellular disease such as cirrhosis. No parenchymal lesions no masses. No biliary dilatation. Portal vein is patent on color Doppler imaging with normal direction of blood flow towards the liver. Other: None. IMPRESSION: Heterogeneous echogenic nodular appearing liver correlate with hepatocellular disease such as cirrhosis. No parenchymal lesions no masses. No biliary dilatation. Findings appear stable since prior examination Electronically Signed   By: Fredrich Jefferson M.D.   On: 08/14/2023 21:03    Assessment & Plan:  .Post-traumatic osteoarthritis of right knee Assessment & Plan: HE CONTINUES TO HAVE PAIN AND INSTABILTY OF RIGHT KNEE DESPITE HOME PHYSICAL THERAPY TO IMPROVE QUAD STRENGTH .  KNEE I  S GIVING WAY AND HE FEELS UNSTABLE ON UNEVEN GROUND . Agree that future hunting trips are ill advised and should be suspended.  I have completed the from from  [facility] to support cancellation and refund of monies spent on July 5 hunting trip   Age-related osteoporosis without current pathological fracture -     Denosumab   Pressure injury of right buttock, stage 2 (HCC) Assessment & Plan: With subsequent development of bilateral shallow ulcerations, likely due to think body habitus and excessive moisture.  Continue use of gauze to prevent a=maceration.  Adding keflex empirically    Other orders -     Thyroid ; Take 1 tablet (60 mg total) by mouth daily.  Dispense: 90 tablet; Refill: 1 -     Cephalexin; Take 1 capsule (500 mg total) by mouth 4 (four) times daily.  Dispense: 28 capsule; Refill:  0     I spent 34 minutes on the day of this face to face encounter reviewing patient's  most recent visit with orthopedics ,  prior relevant surgical and non surgical procedures, recent  labs and imaging studies, counseling on wound care ,  reviewing the assessment and plan with patient, and post visit ordering and reviewing of  diagnostics and therapeutics with patient  .   Follow-up: Return in about 4 weeks (around 11/25/2023) for sacral decub ulcer .   Thersia Flax, MD

## 2023-10-28 NOTE — Patient Instructions (Signed)
 Keep the soft gauze placed between your buttocks to allow airflow .  Too much moisutre, not enough air currently   I am adding  generic keflex for one week to cure any skin infection that may be complicating the healing process  Make an appt with Dr Lehman Pummel about the spot on your head

## 2023-10-29 DIAGNOSIS — L89319 Pressure ulcer of right buttock, unspecified stage: Secondary | ICD-10-CM | POA: Insufficient documentation

## 2023-10-29 DIAGNOSIS — R208 Other disturbances of skin sensation: Secondary | ICD-10-CM | POA: Diagnosis not present

## 2023-10-29 DIAGNOSIS — C4442 Squamous cell carcinoma of skin of scalp and neck: Secondary | ICD-10-CM | POA: Diagnosis not present

## 2023-10-29 DIAGNOSIS — D485 Neoplasm of uncertain behavior of skin: Secondary | ICD-10-CM | POA: Diagnosis not present

## 2023-10-29 NOTE — Assessment & Plan Note (Signed)
 With subsequent development of bilateral shallow ulcerations, likely due to think body habitus and excessive moisture.  Continue use of gauze to prevent a=maceration.  Adding keflex empirically

## 2023-11-01 ENCOUNTER — Telehealth: Payer: Self-pay | Admitting: Internal Medicine

## 2023-11-01 NOTE — Telephone Encounter (Signed)
 Patient dropped [facility] physicians statement form to be completed by Dr Madelon Scheuermann. Forms are up front in Dr Berl Breed color folder.

## 2023-11-04 NOTE — Telephone Encounter (Signed)
Placed in red folder for completion.  

## 2023-11-05 DIAGNOSIS — Z0279 Encounter for issue of other medical certificate: Secondary | ICD-10-CM

## 2023-11-06 NOTE — Telephone Encounter (Signed)
 LMTCB. Need to let pt know that paperwork is complete and ready for pick up.

## 2023-11-06 NOTE — Telephone Encounter (Signed)
 Copied from CRM [id number]. Topic: General - Other >> Nov 06, 2023  9:44 AM Albertha Alosa wrote: Reason for CRM: Patient called in regarding paperwork, wanted to know the status and if it has been completed

## 2023-11-07 NOTE — Telephone Encounter (Unsigned)
 Copied from CRM [id number]. Topic: General - Other >> Nov 07, 2023  9:18 AM Clyde Darling P wrote: Reason for CRM: Pt received a missed call- advise paperwork is complete and ready for pickup. Pt advise he will be by shortly to get them.

## 2023-11-07 NOTE — Telephone Encounter (Signed)
 noted

## 2023-11-19 ENCOUNTER — Other Ambulatory Visit: Payer: Self-pay | Admitting: Internal Medicine

## 2023-11-19 DIAGNOSIS — F411 Generalized anxiety disorder: Secondary | ICD-10-CM

## 2023-11-23 ENCOUNTER — Other Ambulatory Visit: Payer: Self-pay | Admitting: Family

## 2023-11-23 ENCOUNTER — Other Ambulatory Visit: Payer: Self-pay | Admitting: Internal Medicine

## 2023-11-23 DIAGNOSIS — N529 Male erectile dysfunction, unspecified: Secondary | ICD-10-CM

## 2023-11-26 NOTE — Telephone Encounter (Signed)
 Refilled: 06/11/2022 Last OV: 09/23/2023 Next OV: 11/28/2023

## 2023-11-28 ENCOUNTER — Ambulatory Visit (INDEPENDENT_AMBULATORY_CARE_PROVIDER_SITE_OTHER): Admitting: Internal Medicine

## 2023-11-28 ENCOUNTER — Encounter: Payer: Self-pay | Admitting: Internal Medicine

## 2023-11-28 VITALS — BP 126/68 | HR 115 | Ht 72.0 in | Wt 155.6 lb

## 2023-11-28 DIAGNOSIS — L89312 Pressure ulcer of right buttock, stage 2: Secondary | ICD-10-CM | POA: Diagnosis not present

## 2023-11-28 DIAGNOSIS — K7460 Unspecified cirrhosis of liver: Secondary | ICD-10-CM | POA: Diagnosis not present

## 2023-11-28 DIAGNOSIS — R630 Anorexia: Secondary | ICD-10-CM | POA: Diagnosis not present

## 2023-11-28 DIAGNOSIS — Z6821 Body mass index (BMI) 21.0-21.9, adult: Secondary | ICD-10-CM

## 2023-11-28 MED ORDER — MIRTAZAPINE 7.5 MG PO TABS: 7.5000 mg | ORAL_TABLET | Freq: Every day | ORAL | 1 refills | Status: DC

## 2023-11-28 MED ORDER — ZINC OXIDE 10 % EX OINT: TOPICAL_OINTMENT | CUTANEOUS | 2 refills | Status: DC

## 2023-11-28 NOTE — Progress Notes (Signed)
 Subjective:  Patient ID: Tony Luna, male    DOB: 22-May-1941  Age: 83 y.o. MRN: [id number]  CC: The primary encounter diagnosis was Pressure injury of right buttock, stage 2 (HCC). Diagnoses of Anorexia, Body mass index (BMI) of 21.0 to 21.9 in adult, and Cirrhosis of liver without ascites, unspecified hepatic cirrhosis type (HCC) were also pertinent to this visit.   HPI KALMEN LOLLAR presents for  Chief Complaint  Patient presents with   Medical Management of Chronic Issues    4 week follow up    Follow up on Sacral decubitus ulcer noted at last visit 4 weeks ago .  He has not noticed any change in the ulcer on his right buttock   Unintentional weight loss of 7 lbs.  He has had a diminished appetite.  Denies abdominal pain.   Cirrhosis:; annual ultrasound done every March    Outpatient Medications Prior to Visit  Medication Sig Dispense Refill   Boron 6 MG TABS Take by mouth daily.     clotrimazole-betamethasone (LOTRISONE) cream Apply topically.     loperamide  (IMODIUM ) 2 MG capsule Take 1 capsule (2 mg total) by mouth as needed for diarrhea or loose stools. 30 capsule 0   Multiple Vitamin (MULTIVITAMIN WITH MINERALS) TABS tablet Take 1 tablet by mouth 2 (two) times daily.     mupirocin ointment (BACTROBAN) 2 % SMARTSIG:1 Application Topical 2-3 Times Daily     OVER THE COUNTER MEDICATION Take 1 capsule by mouth 3 (three) times daily. Instaflex     OVER THE COUNTER MEDICATION Take 250 mg by mouth daily. Bio Magnesium  Citrate     OVER THE COUNTER MEDICATION Take 20,000 mcg by mouth daily. Vitamin B12-methylfolate     OVER THE COUNTER MEDICATION Take 800 mcg by mouth daily. Active B Folate     OVER THE COUNTER MEDICATION 2 tablets daily. VitaPrime     OVER THE COUNTER MEDICATION 1,000 mg daily. Super Bio-C buffered     OVER THE COUNTER MEDICATION 1,000 mg daily. L'Arginine     Saw Palmetto 160 MG TABS daily in the afternoon.     tadalafil  (CIALIS ) 20 MG tablet TAKE ONE TABLET  DAILY AS NEEDED FOR ERECTILE DYSFUNCTION 90 tablet 3   tamsulosin  (FLOMAX ) 0.4 MG CAPS capsule Take 2 capsules (0.8 mg total) by mouth daily. 90 capsule 1   Testosterone  75 MG PLLT by Implant route.     thyroid  (NP THYROID ) 60 MG tablet Take 1 tablet (60 mg total) by mouth daily. 90 tablet 1   traMADol  (ULTRAM ) 50 MG tablet TAKE ONE TABLET EVERY 6 HOURS AS NEEDED FOR PAIN 28 tablet 0   sertraline  (ZOLOFT ) 50 MG tablet TAKE 1 TABLET BY MOUTH DAILY 30 tablet 3   cephALEXin  (KEFLEX ) 500 MG capsule Take 1 capsule (500 mg total) by mouth 4 (four) times daily. 28 capsule 0   Facility-Administered Medications Prior to Visit  Medication Dose Route Frequency Provider Last Rate Last Admin   [START ON 04/28/2024] denosumab  (PROLIA ) injection 60 mg  60 mg Subcutaneous Q6 months Marylynn Verneita CROME, MD        Review of Systems;  Patient denies headache, fevers, malaise, unintentional weight loss, skin rash, eye pain, sinus congestion and sinus pain, sore throat, dysphagia,  hemoptysis , cough, dyspnea, wheezing, chest pain, palpitations, orthopnea, edema, abdominal pain, nausea, melena, diarrhea, constipation, flank pain, dysuria, hematuria, urinary  Frequency, nocturia, numbness, tingling, seizures,  Focal weakness, Loss of consciousness,  Tremor, insomnia, depression,  anxiety, and suicidal ideation.      Objective:  BP 126/68   Pulse (!) 115   Ht 6' (1.829 m)   Wt 155 lb 9.6 oz (70.6 kg)   SpO2 93%   BMI 21.10 kg/m   BP Readings from Last 3 Encounters:  11/28/23 126/68  10/28/23 126/68  09/23/23 124/66    Wt Readings from Last 3 Encounters:  11/28/23 155 lb 9.6 oz (70.6 kg)  10/28/23 161 lb 9.6 oz (73.3 kg)  09/23/23 160 lb (72.6 kg)    Physical Exam Vitals reviewed.  Constitutional:      General: He is not in acute distress.    Appearance: Normal appearance. He is underweight. He is not ill-appearing, toxic-appearing or diaphoretic.  HENT:     Head: Normocephalic.  Eyes:     General:  No scleral icterus.       Right eye: No discharge.        Left eye: No discharge.     Conjunctiva/sclera: Conjunctivae normal.  Cardiovascular:     Rate and Rhythm: Normal rate and regular rhythm.     Heart sounds: Normal heart sounds.  Pulmonary:     Effort: Pulmonary effort is normal. No respiratory distress.     Breath sounds: Normal breath sounds.  Musculoskeletal:        General: Normal range of motion.     Cervical back: Normal range of motion.  Skin:    General: Skin is warm and dry.     Findings: Wound present.      Neurological:     General: No focal deficit present.     Mental Status: He is alert and oriented to person, place, and time. Mental status is at baseline.  Psychiatric:        Mood and Affect: Mood normal.        Behavior: Behavior normal.        Thought Content: Thought content normal.        Judgment: Judgment normal.     Lab Results  Component Value Date   HGBA1C 4.4 (L) 09/23/2023   HGBA1C 4.7 03/22/2023   HGBA1C 4.9 07/02/2022    Lab Results  Component Value Date   CREATININE 0.78 09/23/2023   CREATININE 0.91 07/25/2023   CREATININE 1.16 03/22/2023    Lab Results  Component Value Date   WBC 3.8 (L) 09/23/2023   HGB 12.4 (L) 09/23/2023   HCT 35.5 (L) 09/23/2023   PLT 137.0 (L) 09/23/2023   GLUCOSE 111 (H) 09/23/2023   CHOL 202 (H) 09/23/2023   TRIG 61.0 09/23/2023   HDL 106.60 09/23/2023   LDLDIRECT 74.0 09/23/2023   LDLCALC 84 09/23/2023   ALT 66 (H) 09/23/2023   AST 86 (H) 09/23/2023   NA 140 09/23/2023   K 4.1 09/23/2023   CL 102 09/23/2023   CREATININE 0.78 09/23/2023   BUN 12 09/23/2023   CO2 27 09/23/2023   TSH 2.00 09/23/2023   PSA 1.65 07/02/2022   INR 1.0 11/04/2019   HGBA1C 4.4 (L) 09/23/2023    US  Abdomen Limited RUQ (LIVER/GB) Result Date: 08/14/2023 CLINICAL DATA:  Cirrhosis, elevated AST EXAM: ULTRASOUND ABDOMEN LIMITED RIGHT UPPER QUADRANT COMPARISON:  Ultrasound 03/28/2023, MRI 04/06/1999 and 23 and CT  06/27/2021 FINDINGS: Gallbladder: No gallstones or wall thickening visualized. No sonographic Murphy sign noted by sonographer. Common bile duct: Diameter: 4.3 mm Liver: Heterogeneous echogenic nodular appearing liver correlate with hepatocellular disease such as cirrhosis. No parenchymal lesions no masses. No biliary dilatation.  Portal vein is patent on color Doppler imaging with normal direction of blood flow towards the liver. Other: None. IMPRESSION: Heterogeneous echogenic nodular appearing liver correlate with hepatocellular disease such as cirrhosis. No parenchymal lesions no masses. No biliary dilatation. Findings appear stable since prior examination Electronically Signed   By: Franky Chard M.D.   On: 08/14/2023 21:03    Assessment & Plan:  .Pressure injury of right buttock, stage 2 (HCC) Assessment & Plan: No change to  subsequent development of bilateral shallow ulcerations, likely due to thin body habitus and excessive moisture.  Continue use of gauze to prevent maceration.  referring to wound center   Orders: -     Ambulatory referral to Wound Clinic -     Zinc  Oxide; Apply twice daily to sacral decubitus ulcer  Dispense: 85 g; Refill: 2  Anorexia  Body mass index (BMI) of 21.0 to 21.9 in adult Assessment & Plan:  I have reviewed hi diet and recommended that he increase his protein and fat intake .  Adding remeron  for appetite stimulation.  Stopping zoloft    Cirrhosis of liver without ascites, unspecified hepatic cirrhosis type [facility]) Assessment & Plan: Diagnosed in 2016 .  ETIOLOGY presumed  ETOH,  ALSO HAS IRON OVERLOAD without indication for phlebotomy per hematology.  HH workup negative.  He has reduced his intake of wine . LFTS improving     Lab Results  Component Value Date   ALT 66 (H) 09/23/2023   AST 86 (H) 09/23/2023   ALKPHOS 134 (H) 09/23/2023   BILITOT 1.6 (H) 09/23/2023      Other orders -     Mirtazapine ; Take 1 tablet (7.5 mg total) by mouth at bedtime.   Dispense: 90 tablet; Refill: 1     I spent 34 minutes on the day of this face to face encounter reviewing patient's  most recent visit with cardiology,  nephrology,  and neurology,  prior relevant surgical and non surgical procedures, recent  labs and imaging studies, counseling on weight management,  reviewing the assessment and plan with patient, and post visit ordering and reviewing of  diagnostics and therapeutics with patient  .   Follow-up: Return in about 1 month (around 12/29/2023).   Verneita LITTIE Kettering, MD

## 2023-11-28 NOTE — Patient Instructions (Addendum)
 Stop sertraline  for now.  We're going to try a different antidepressant.   Start mirtazapine at bedtime.  Increase dose to 2  tablets after one week if you need additional help sleeping  , this should stimulate  appetite   When you feel like skipping a meal ,  drink a protein shake instead.  You need the protein to heal your wound   I am recommending that you use an ointment with zinc  oxide in it on your wound ,  and I am making a referral to the Wound Clinic

## 2023-11-30 NOTE — Assessment & Plan Note (Signed)
 I have reviewed hi diet and recommended that he increase his protein and fat intake .  Adding remeron  for appetite stimulation.  Stopping zoloft 

## 2023-11-30 NOTE — Assessment & Plan Note (Signed)
 Diagnosed in 2016 .  ETIOLOGY presumed  ETOH,  ALSO HAS IRON OVERLOAD without indication for phlebotomy per hematology.  HH workup negative.  He has reduced his intake of wine . LFTS improving     Lab Results  Component Value Date   ALT 66 (H) 09/23/2023   AST 86 (H) 09/23/2023   ALKPHOS 134 (H) 09/23/2023   BILITOT 1.6 (H) 09/23/2023

## 2023-11-30 NOTE — Assessment & Plan Note (Addendum)
 No change to  subsequent development of bilateral shallow ulcerations, likely due to thin body habitus and excessive moisture.  Continue use of gauze to prevent maceration.  referring to wound center

## 2023-12-05 ENCOUNTER — Ambulatory Visit: Payer: Self-pay

## 2023-12-05 NOTE — Telephone Encounter (Signed)
 FYI Only or Action Required?: FYI only for provider.  Patient was last seen in primary care on 11/28/2023 by Marylynn Verneita CROME, MD.  Called Nurse Triage reporting No chief complaint on file..  Symptoms began several days ago.  Interventions attempted: Rest, hydration, or home remedies.  Symptoms are: unchanged.  Triage Disposition: See PCP When Office is Open (Within 3 Days)  Patient/caregiver understands and will follow disposition?: Yes    Copied from CRM [id number]. Topic: Clinical - Red Word Triage >> Dec 05, 2023  3:42 PM Tony Luna wrote: Red Word that prompted transfer to Nurse Triage: Patient has been experiencing weakness, low on energy for the last 3-4 days.No other symptoms. Reason for Disposition  [1] MILD weakness (e.g., does not interfere with ability to work, go to school, normal activities) AND [2] persists > 1 week  Answer Assessment - Initial Assessment Questions 1. DESCRIPTION: Describe how you are feeling.     Fatigued, Weakness  2. SEVERITY: How bad is it?  Can you stand and walk?     Yes, able to stand and walk  3. ONSET: When did these symptoms begin? (e.g., hours, days, weeks, months)     Last couple of days  4. CAUSE: What do you think is causing the weakness or fatigue? (e.g., not drinking enough fluids, medical problem, trouble sleeping)     Unsure  5. NEW MEDICINES:  Have you started on any new medicines recently? (e.g., opioid pain medicines, benzodiazepines, muscle relaxants, antidepressants, antihistamines, neuroleptics, beta blockers)     No  6. OTHER SYMPTOMS: Do you have any other symptoms? (e.g., chest pain, fever, cough, SOB, vomiting, diarrhea, bleeding, other areas of pain)     No  Protocols used: Weakness (Generalized) and Fatigue-A-AH

## 2023-12-06 NOTE — Telephone Encounter (Signed)
 Please call pt to get more information regarding weakness, as mentioned above.

## 2023-12-06 NOTE — Telephone Encounter (Signed)
 Called Patient he stated he has general weakness and has not had an exam recently so he would just like to get checked out. Patient denies any falls, swelling, chest pain, shortness of breath, fever or headaches . Patient states he has not been exercising as much so he thinks it may be from that. Patient is aware if he gets any symptoms to let us  know. Patient understands and is agreeable.

## 2023-12-09 ENCOUNTER — Telehealth: Payer: Self-pay

## 2023-12-09 NOTE — Telephone Encounter (Signed)
 Copied from CRM [id number]. Topic: Clinical - Medication Question >> Dec 09, 2023  2:29 PM Chasity T wrote: Reason for CRM: Patient states that the ointment that Dr Marylynn has prescribed to him for back pain is not working and he is wanting to know If he can get sent another kind that will work better.

## 2023-12-11 NOTE — Telephone Encounter (Signed)
 Pt stated that he does not think it is infected but the can not see it. He stated that it is just still painful and doesn't seem to be getting any smaller but also doesn't appear to be getting any bigger.

## 2023-12-11 NOTE — Telephone Encounter (Signed)
 Spoke with pt and he stated that he is aware of the wound care appt. Pt also stated that he will get him a gel cushion and see if that helps with the pain.

## 2023-12-13 ENCOUNTER — Encounter: Payer: Self-pay | Admitting: Emergency Medicine

## 2023-12-13 ENCOUNTER — Emergency Department

## 2023-12-13 ENCOUNTER — Encounter: Payer: Self-pay | Admitting: Nurse Practitioner

## 2023-12-13 ENCOUNTER — Emergency Department
Admission: EM | Admit: 2023-12-13 | Discharge: 2023-12-14 | Disposition: A | Discharge status: Home / Self Care | Source: Ambulatory Visit | Attending: Emergency Medicine | Admitting: Emergency Medicine

## 2023-12-13 ENCOUNTER — Ambulatory Visit

## 2023-12-13 ENCOUNTER — Telehealth: Payer: Self-pay

## 2023-12-13 ENCOUNTER — Ambulatory Visit: Admitting: Nurse Practitioner

## 2023-12-13 ENCOUNTER — Ambulatory Visit: Payer: Self-pay | Admitting: Nurse Practitioner

## 2023-12-13 ENCOUNTER — Other Ambulatory Visit: Payer: Self-pay

## 2023-12-13 VITALS — BP 112/72 | HR 117 | Temp 98.3°F | Ht 72.0 in | Wt 154.8 lb

## 2023-12-13 DIAGNOSIS — S0083XA Contusion of other part of head, initial encounter: Secondary | ICD-10-CM | POA: Diagnosis not present

## 2023-12-13 DIAGNOSIS — R251 Tremor, unspecified: Secondary | ICD-10-CM | POA: Diagnosis not present

## 2023-12-13 DIAGNOSIS — R918 Other nonspecific abnormal finding of lung field: Secondary | ICD-10-CM | POA: Diagnosis not present

## 2023-12-13 DIAGNOSIS — E876 Hypokalemia: Secondary | ICD-10-CM | POA: Insufficient documentation

## 2023-12-13 DIAGNOSIS — M810 Age-related osteoporosis without current pathological fracture: Secondary | ICD-10-CM | POA: Diagnosis not present

## 2023-12-13 DIAGNOSIS — K7030 Alcoholic cirrhosis of liver without ascites: Secondary | ICD-10-CM | POA: Insufficient documentation

## 2023-12-13 DIAGNOSIS — K7031 Alcoholic cirrhosis of liver with ascites: Secondary | ICD-10-CM | POA: Diagnosis not present

## 2023-12-13 DIAGNOSIS — D72819 Decreased white blood cell count, unspecified: Secondary | ICD-10-CM | POA: Diagnosis not present

## 2023-12-13 DIAGNOSIS — R7401 Elevation of levels of liver transaminase levels: Secondary | ICD-10-CM | POA: Diagnosis not present

## 2023-12-13 DIAGNOSIS — K7460 Unspecified cirrhosis of liver: Secondary | ICD-10-CM

## 2023-12-13 DIAGNOSIS — D696 Thrombocytopenia, unspecified: Secondary | ICD-10-CM | POA: Diagnosis not present

## 2023-12-13 DIAGNOSIS — K760 Fatty (change of) liver, not elsewhere classified: Secondary | ICD-10-CM | POA: Insufficient documentation

## 2023-12-13 DIAGNOSIS — L89313 Pressure ulcer of right buttock, stage 3: Secondary | ICD-10-CM | POA: Diagnosis not present

## 2023-12-13 DIAGNOSIS — F10239 Alcohol dependence with withdrawal, unspecified: Secondary | ICD-10-CM | POA: Diagnosis not present

## 2023-12-13 DIAGNOSIS — R9431 Abnormal electrocardiogram [ECG] [EKG]: Secondary | ICD-10-CM | POA: Diagnosis not present

## 2023-12-13 DIAGNOSIS — F10939 Alcohol use, unspecified with withdrawal, unspecified: Secondary | ICD-10-CM

## 2023-12-13 DIAGNOSIS — Y909 Presence of alcohol in blood, level not specified: Secondary | ICD-10-CM | POA: Insufficient documentation

## 2023-12-13 DIAGNOSIS — R7989 Other specified abnormal findings of blood chemistry: Secondary | ICD-10-CM | POA: Insufficient documentation

## 2023-12-13 DIAGNOSIS — F10139 Alcohol abuse with withdrawal, unspecified: Secondary | ICD-10-CM | POA: Diagnosis not present

## 2023-12-13 DIAGNOSIS — X58XXXA Exposure to other specified factors, initial encounter: Secondary | ICD-10-CM | POA: Diagnosis not present

## 2023-12-13 DIAGNOSIS — K5730 Diverticulosis of large intestine without perforation or abscess without bleeding: Secondary | ICD-10-CM | POA: Diagnosis not present

## 2023-12-13 DIAGNOSIS — K4090 Unilateral inguinal hernia, without obstruction or gangrene, not specified as recurrent: Secondary | ICD-10-CM | POA: Diagnosis not present

## 2023-12-13 DIAGNOSIS — R531 Weakness: Secondary | ICD-10-CM | POA: Diagnosis not present

## 2023-12-13 LAB — COMPREHENSIVE METABOLIC PANEL WITH GFR
ALT: 146 U/L — ABNORMAL HIGH (ref 0–53)
ALT: 144 U/L — ABNORMAL HIGH (ref 0–44)
AST: 302 U/L — ABNORMAL HIGH (ref 0–37)
AST: 306 U/L — ABNORMAL HIGH (ref 15–41)
Albumin: 3.5 g/dL (ref 3.5–5.2)
Albumin: 3 g/dL — ABNORMAL LOW (ref 3.5–5.0)
Alkaline Phosphatase: 187 U/L — ABNORMAL HIGH (ref 39–117)
Alkaline Phosphatase: 165 U/L — ABNORMAL HIGH (ref 38–126)
Anion gap: 17 — ABNORMAL HIGH (ref 5–15)
BUN: 11 mg/dL (ref 6–23)
BUN: 13 mg/dL (ref 8–23)
CO2: 27 meq/L (ref 19–32)
CO2: 20 mmol/L — ABNORMAL LOW (ref 22–32)
Calcium: 9.1 mg/dL (ref 8.4–10.5)
Calcium: 9.1 mg/dL (ref 8.9–10.3)
Chloride: 99 meq/L (ref 96–112)
Chloride: 101 mmol/L (ref 98–111)
Creatinine, Ser: 0.7 mg/dL (ref 0.40–1.50)
Creatinine, Ser: 0.67 mg/dL (ref 0.61–1.24)
GFR, Estimated: >60 mL/min (ref >60)
GFR: 86.63 mL/min (ref >60.00)
Glucose, Bld: 99 mg/dL (ref 70–99)
Glucose, Bld: 101 mg/dL — ABNORMAL HIGH (ref 70–99)
Potassium: 3.3 meq/L — ABNORMAL LOW (ref 3.5–5.1)
Potassium: 3.2 mmol/L — ABNORMAL LOW (ref 3.5–5.1)
Sodium: 139 meq/L (ref 135–145)
Sodium: 138 mmol/L (ref 135–145)
Total Bilirubin: 7.2 mg/dL — ABNORMAL HIGH (ref 0.2–1.2)
Total Bilirubin: 7.1 mg/dL — ABNORMAL HIGH (ref 0.0–1.2)
Total Protein: 6.1 g/dL (ref 6.0–8.3)
Total Protein: 6.6 g/dL (ref 6.5–8.1)

## 2023-12-13 LAB — URINALYSIS, ROUTINE W REFLEX MICROSCOPIC
Bilirubin Urine: NEGATIVE
Glucose, UA: NEGATIVE mg/dL
Hgb urine dipstick: NEGATIVE
Ketones, ur: NEGATIVE mg/dL
Leukocytes,Ua: NEGATIVE
Nitrite: NEGATIVE
Protein, ur: NEGATIVE mg/dL
Specific Gravity, Urine: 1.004 — ABNORMAL LOW (ref 1.005–1.030)
pH: 7 (ref 5.0–8.0)

## 2023-12-13 LAB — CBC WITH DIFFERENTIAL/PLATELET
Basophils Absolute: 0.1 K/uL (ref 0.0–0.1)
Basophils Relative: 2.8 % (ref 0.0–3.0)
Eosinophils Absolute: 0.1 K/uL (ref 0.0–0.7)
Eosinophils Relative: 2 % (ref 0.0–5.0)
HCT: 35.3 % — ABNORMAL LOW (ref 39.0–52.0)
Hemoglobin: 12.2 g/dL — ABNORMAL LOW (ref 13.0–17.0)
Lymphocytes Relative: 13.4 % (ref 12.0–46.0)
Lymphs Abs: 0.4 K/uL — ABNORMAL LOW (ref 0.7–4.0)
MCHC: 34.6 g/dL (ref 30.0–36.0)
MCV: 104.6 fl — ABNORMAL HIGH (ref 78.0–100.0)
Monocytes Absolute: 0.4 K/uL (ref 0.1–1.0)
Monocytes Relative: 15 % — ABNORMAL HIGH (ref 3.0–12.0)
Neutro Abs: 1.8 K/uL (ref 1.4–7.7)
Neutrophils Relative %: 66.8 % (ref 43.0–77.0)
Platelets: 110 K/uL — ABNORMAL LOW (ref 150.0–400.0)
RBC: 3.38 Mil/uL — ABNORMAL LOW (ref 4.22–5.81)
RDW: 13.2 % (ref 11.5–15.5)
WBC: 2.7 K/uL — ABNORMAL LOW (ref 4.0–10.5)

## 2023-12-13 LAB — CBC
HCT: 31.8 % — ABNORMAL LOW (ref 39.0–52.0)
Hemoglobin: 11.3 g/dL — ABNORMAL LOW (ref 13.0–17.0)
MCH: 36.1 pg — ABNORMAL HIGH (ref 26.0–34.0)
MCHC: 35.5 g/dL (ref 30.0–36.0)
MCV: 101.6 fL — ABNORMAL HIGH (ref 80.0–100.0)
Platelets: 109 K/uL — ABNORMAL LOW (ref 150–400)
RBC: 3.13 MIL/uL — ABNORMAL LOW (ref 4.22–5.81)
RDW: 12.5 % (ref 11.5–15.5)
WBC: 3.6 K/uL — ABNORMAL LOW (ref 4.0–10.5)
nRBC: 0 % (ref 0.0–0.2)

## 2023-12-13 LAB — PROTIME-INR
INR: 1.3 — ABNORMAL HIGH (ref 0.8–1.2)
Prothrombin Time: 16.6 s — ABNORMAL HIGH (ref 11.4–15.2)

## 2023-12-13 LAB — TROPONIN I (HIGH SENSITIVITY): Troponin I (High Sensitivity): 20 ng/L — ABNORMAL HIGH (ref <18)

## 2023-12-13 LAB — VITAMIN D 25 HYDROXY (VIT D DEFICIENCY, FRACTURES): VITD: >120 ng/mL

## 2023-12-13 LAB — ACETAMINOPHEN LEVEL: Acetaminophen (Tylenol), Serum: 19 ug/mL (ref 10–30)

## 2023-12-13 MED ORDER — SODIUM CHLORIDE 0.9 % IV BOLUS
1000.0000 mL | Freq: Once | INTRAVENOUS | Status: AC
  Administered 2023-12-13: 1000 mL via INTRAVENOUS

## 2023-12-13 MED ORDER — POTASSIUM CHLORIDE CRYS ER 20 MEQ PO TBCR
40.0000 meq | EXTENDED_RELEASE_TABLET | Freq: Once | ORAL | Status: AC
  Administered 2023-12-13: 40 meq via ORAL
  Filled 2023-12-13: qty 2

## 2023-12-13 MED ORDER — THIAMINE HCL 100 MG/ML IJ SOLN
100.0000 mg | Freq: Once | INTRAMUSCULAR | Status: AC
  Administered 2023-12-13: 100 mg via INTRAVENOUS
  Filled 2023-12-13: qty 2

## 2023-12-13 MED ORDER — SULFAMETHOXAZOLE-TRIMETHOPRIM 800-160 MG PO TABS
1.0000 | ORAL_TABLET | Freq: Two times a day (BID) | ORAL | 0 refills | Status: DC
Start: 2023-12-13 — End: 2023-12-22

## 2023-12-13 MED ORDER — IOHEXOL 300 MG/ML  SOLN
100.0000 mL | Freq: Once | INTRAMUSCULAR | Status: AC | PRN
Start: 2023-12-13 — End: 2023-12-13
  Administered 2023-12-13: 100 mL via INTRAVENOUS

## 2023-12-13 MED ORDER — PHENOBARBITAL SODIUM 65 MG/ML IJ SOLN
130.0000 mg | Freq: Once | INTRAMUSCULAR | Status: AC
  Administered 2023-12-13: 130 mg via INTRAVENOUS
  Filled 2023-12-13: qty 2

## 2023-12-13 NOTE — ED Provider Notes (Signed)
 Tony Luna Provider Note    Event Date/Time   First MD Initiated Contact with Patient 12/13/23 2058     (approximate)   History   Abnormal Lab and Weakness   HPI  Tony Luna is a 83 y.o. male with history of chronic sacral wound, alcohol use, senting with abnormal labs.  Was found to have elevated liver enzymes, bilirubin as well as low potassium.  Was sent to the emergency department for further management.  Patient states that he drinks daily, had 2 glasses of wine today.  Denies history of withdrawals.  States his last drink was earlier today.  States that he does have history of cirrhosis.  Denies any abdominal pain.  No chest pain or difficulty breathing, no cough or urinary symptoms.  No nausea vomiting or diarrhea.  Patient is noted to have ecchymosis to his face, states that he hit his face against a door 3 to 4 weeks ago.  Denies falling or hitting his head.  He denies any headache or vision changes, no weakness or numbness focally.  On independent chart review, he was seen by his primary care provider for several days of fatigue and low energy as well as generalized weakness also with persistent pain to his sacral region.  Was started on 10 days of Bactrim .  Labs were drawn then showed a T. bili of 7.2, AST was 302, ALT is 146.     Physical Exam   Triage Vital Signs: ED Triage Vitals  Encounter Vitals Group     BP 12/13/23 1906 138/83     Girls Systolic BP Percentile --      Girls Diastolic BP Percentile --      Boys Systolic BP Percentile --      Boys Diastolic BP Percentile --      Pulse Rate 12/13/23 1906 (!) 107     Resp 12/13/23 1906 16     Temp 12/13/23 1906 97.7 F (36.5 C)     Temp Source 12/13/23 1906 Oral     SpO2 12/13/23 1905 94 %     Weight --      Height --      Head Circumference --      Peak Flow --      Pain Score 12/13/23 1906 0     Pain Loc --      Pain Education --      Exclude from Growth Chart --     Most  recent vital signs: Vitals:   12/13/23 2200 12/13/23 2230  BP: 131/72 (!) 122/104  Pulse: 88 91  Resp: 12 14  Temp:    SpO2: 92% 100%     General: Awake, no distress.  CV:  Good peripheral perfusion.  Resp:  Normal effort.  No tachypnea or respiratory distress Abd:  No distention.  Nontender Other:  Ecchymoses to bilateral zygoma and lower periorbital region, no other palpable skull deformities or tenderness, he is jaundiced, has mild bilateral hand tremors as well as tongue fasciculations, dry mucous membranes   ED Results / Procedures / Treatments   Labs (all labs ordered are listed, but only abnormal results are displayed) Labs Reviewed  COMPREHENSIVE METABOLIC PANEL WITH GFR - Abnormal; Notable for the following components:      Result Value   Potassium 3.2 (*)    CO2 20 (*)    Glucose, Bld 101 (*)    Albumin 3.0 (*)    AST 306 (*)  ALT 144 (*)    Alkaline Phosphatase 165 (*)    Total Bilirubin 7.1 (*)    Anion gap 17 (*)    All other components within normal limits  CBC - Abnormal; Notable for the following components:   WBC 3.6 (*)    RBC 3.13 (*)    Hemoglobin 11.3 (*)    HCT 31.8 (*)    MCV 101.6 (*)    MCH 36.1 (*)    Platelets 109 (*)    All other components within normal limits  TROPONIN I (HIGH SENSITIVITY) - Abnormal; Notable for the following components:   Troponin I (High Sensitivity) 20 (*)    All other components within normal limits  ACETAMINOPHEN  LEVEL  URINALYSIS, ROUTINE W REFLEX MICROSCOPIC  PROTIME-INR  HEPATITIS PANEL, ACUTE  CBG MONITORING, ED  TROPONIN I (HIGH SENSITIVITY)     EKG  EKG shows, sinus rhythm with PACs, rate 97, normal QRS, normal QTc, no obvious ischemic ST elevation, T wave flattening in aVL, not significantly compared to prior   RADIOLOGY On my independent interpretation, ultrasound without obvious gallstones, CBD is normal.   PROCEDURES:  Critical Care performed: Yes, see critical care procedure  note(s)  .Critical Care  Performed by: Waymond Lorelle Cummins, MD Authorized by: Waymond Lorelle Cummins, MD   Critical care provider statement:    Critical care time (minutes):  40   Critical care was necessary to treat or prevent imminent or life-threatening deterioration of the following conditions:  Toxidrome (Alcohol withdrawal)   Critical care was time spent personally by me on the following activities:  Development of treatment plan with patient or surrogate, discussions with consultants, evaluation of patient's response to treatment, examination of patient, ordering and review of laboratory studies, ordering and review of radiographic studies, ordering and performing treatments and interventions, pulse oximetry, re-evaluation of patient's condition and review of old charts    MEDICATIONS ORDERED IN ED: Medications  sodium chloride  0.9 % bolus 1,000 mL (1,000 mLs Intravenous New Bag/Given 12/13/23 2216)  thiamine (VITAMIN B1) injection 100 mg (100 mg Intravenous Given 12/13/23 2216)  PHENObarbital (LUMINAL) injection 130 mg (130 mg Intravenous Given 12/13/23 2217)  potassium chloride  SA (KLOR-CON  M) CR tablet 40 mEq (40 mEq Oral Given 12/13/23 2216)  iohexol  (OMNIPAQUE ) 300 MG/ML solution 100 mL (100 mLs Intravenous Contrast Given 12/13/23 2301)     IMPRESSION / MDM / ASSESSMENT AND PLAN / ED COURSE  I reviewed the triage vital signs and the nursing notes.                              Differential diagnosis includes, but is not limited to, electrolyte derangements, worsening cirrhosis, alcohol hepatitis, choledocholithiasis, mass, UTI, atypical ACS, arrhythmia.  For his tremors and tongue fasciculations consider alcohol withdrawal as well as dehydration.  For the ecchymosis to his face, it appears to be healing,  he has no palpable facial or skull deformities or tenderness, he is not altered at this time, since this occurred weeks ago, will not CT images head or face.  Will give him some IV fluids,  phenobarbital.  Repeat labs, right upper quadrant ultrasound, UA, EKG, troponin, chest x-ray.  Patient's presentation is most consistent with acute presentation with potential threat to life or bodily function.  Independent interpretation of labs and imaging below.  Patient states that he does not have prior history of alcohol withdrawals.  Does have history of cirrhosis but does not  follow with a GI doctor or hepatologist for this.  On reassessment after phenobarbital, tremors have improved.  Patient signed out pending CT imaging study, if no obvious mass or obstruction, able to be discharged with outpatient GI follow-up.  Will also repeat the troponin and PT/INR.  The patient is on the cardiac monitor to evaluate for evidence of arrhythmia and/or significant heart rate changes.   Clinical Course as of 12/13/23 2310  Fri Dec 13, 2023  2142 Independent review of labs, mild leukopenia, thrombocytopenia, potassium is 2.2, will replete, rest electrolyte severely deranged, his LFTs are elevated more so compared to prior.  Troponin is mildly elevated but he has no chest pain at this time, Tylenol  levels are normal. [TT]  2155 US  ABDOMEN LIMITED RUQ (LIVER/GB) Hepatic cirrhosis without focal liver lesions.  [TT]  2212 DG Chest 1 View 1. No acute process.  [TT]    Clinical Course User Index [TT] Waymond Lorelle Cummins, MD     FINAL CLINICAL IMPRESSION(S) / ED DIAGNOSES   Final diagnoses:  Elevated LFTs  Alcohol withdrawal syndrome with complication (HCC)  Hepatic cirrhosis, unspecified hepatic cirrhosis type, unspecified whether ascites present [facility])     Rx / DC Orders   ED Discharge Orders     None        Note:  This document was prepared using Dragon voice recognition software and may include unintentional dictation errors.    Waymond Lorelle Cummins, MD 12/13/23 [id number]

## 2023-12-13 NOTE — ED Triage Notes (Signed)
 Pt reports having bloodwork done today and was sent over by Doctor Tullo for abnormal results, pt unable to recall results. Pt reports having sore on his bottom x 2 months. Pt reports weakness x 1 week. Denies CP or SOB.

## 2023-12-13 NOTE — Assessment & Plan Note (Signed)
 Patient has ulcerative lesions with surrounding erythema and yellowish exudate in the gluteal region. - Advised to cover with nonadherent dressing. -Use tylenol  arthritis 650 mg, two tablets in the morning and two at night for pain. -Will treat with Bactrim  for 10 days Advised to use probiotics. - Suggest using cushion, pillow to alleviate pressure.  - Attempt to expedite wound care appointment.

## 2023-12-13 NOTE — ED Notes (Addendum)
 Pt reports drinking 3 glasses of wine per day, pt reports having 2 glasses today.

## 2023-12-13 NOTE — Progress Notes (Signed)
 Established Patient Office Visit  Subjective:  Patient ID: Tony Luna, male    DOB: [birth year]  Age: 83 y.o. MRN: [id number]  CC:  Chief Complaint  Patient presents with   Acute Visit    Weakness & Fatigue  Discussed the use of a AI scribe software for clinical note transcription with the patient, who gave verbal consent to proceed.   HPI  Tony Luna presents for acute visit. He has been feeling fatigue, low energy from several days. He also mentioned persistent pain from the buttock sore.  Lethargy and fluctuating energy levels are present, attributed to sleep disturbances from the pain. Pain is constant, with a baseline of 3-4 out of 10, increasing with movement  Pain from the buttock sore is excruciating with movement, especially when rising from a seated position, but manageable when sitting or lying down. The sore has persisted for last month. Was treated with keflex  400 mg in June.  Topical treatments are currently in use.  A week ago, they lost balance while carrying laundry and bumped into a door jamb, but did not fall. Have bruising of the face and lateral right hip.   HPI   Past Medical History:  Diagnosis Date   Allergy    Arthritis    Cirrhosis (HCC)    Complication of anesthesia    :need catheter   Dysrhythmia    ? something told by reg dr  unable to say what it was   Esophageal tear    Hypothyroidism    Pneumonia    hx   Primary osteoarthritis of left knee 06/22/2014   Rotator cuff tear    Sleep apnea corrected- dental device    Past Surgical History:  Procedure Laterality Date   BACK SURGERY     blepharaplasty     CATARACT EXTRACTION W/PHACO Left 02/21/2021   Procedure: CATARACT EXTRACTION PHACO AND INTRAOCULAR LENS PLACEMENT (IOC) LEFT 4.56 00:34.9;  Surgeon: Jaye Fallow, MD;  Location: [facility];  Service: Ophthalmology;  Laterality: Left;   CAUTERY OF TURBINATES     COLONOSCOPY WITH PROPOFOL  N/A 07/19/2015   Procedure:  COLONOSCOPY WITH PROPOFOL ;  Surgeon: Rogelia Copping, MD;  Location: [facility];  Service: Endoscopy;  Laterality: N/A;   COLONOSCOPY WITH PROPOFOL  N/A 03/21/2021   Procedure: COLONOSCOPY WITH PROPOFOL ;  Surgeon: Copping Rogelia, MD;  Location: [facility] END[facility];  Service: Endoscopy;  Laterality: N/A;   COLONOSCOPY WITH PROPOFOL  N/A 04/18/2021   Procedure: COLONOSCOPY WITH PROPOFOL ;  Surgeon: Copping Rogelia, MD;  Location: [facility] END[facility]COPY;  Service: Endoscopy;  Laterality: N/A;   COLONOSCOPY WITH PROPOFOL  N/A 04/24/2022   Procedure: COLONOSCOPY WITH PROPOFOL ;  Surgeon: Copping Rogelia, MD;  Location: [facility];  Service: Endoscopy;  Laterality: N/A;   EYE SURGERY     HERNIA REPAIR Left 20+ years ago   inguinal herniorrhapy   JOINT REPLACEMENT  left knee 2016   KNEE SURGERY     ? 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   Procedure: TOTAL KNEE ARTHROPLASTY;  Surgeon: Fonda SHAUNNA Olmsted, MD;  Location: [facility] OR;  Service: Orthopedics;  Laterality: Left;    Family History  Problem Relation Age of Onset   Alcohol abuse Mother    Diabetes Mother    Mental retardation Mother    Arthritis Father    Hearing loss Father  Alcohol abuse Father     Social History   Socioeconomic History   Marital status: Married    Spouse name: Not on file   Number of children: 2   Years of education: Not on file   Highest education level: Bachelor's degree (e.g., BA, AB, BS)  Occupational History   Occupation: textiles  Tobacco Use   Smoking status: Former    Current packs/day: 0.00    Average packs/day: 1 pack/day for 15.0 years (15.0 ttl pk-yrs)    Types: Cigarettes    Start date: 06/14/1968    Quit date: 06/15/1983    Years since quitting: 40.5   Smokeless tobacco: Current    Types: Chew  Vaping Use   Vaping status: Never Used  Substance and Sexual Activity   Alcohol use: Yes     Alcohol/week: 10.0 standard drinks of alcohol    Types: 10 Glasses of wine per week   Drug use: No   Sexual activity: Not Currently  Other Topics Concern   Not on file  Social History Narrative   [employer] graduate   Avid hunter   Very active   Walks 1 hour daily   Social Drivers of Health   Financial Resource Strain: Low Risk  (07/03/2023)   Overall Financial Resource Strain (CARDIA)    Difficulty of Paying Living Expenses: Not hard at all  Food Insecurity: No Food Insecurity (07/03/2023)   Hunger Vital Sign    Worried About Running Out of Food in the Last Year: Never true    Ran Out of Food in the Last Year: Never true  Transportation Needs: No Transportation Needs (07/03/2023)   PRAPARE - Administrator, Civil Service (Medical): No    Lack of Transportation (Non-Medical): No  Physical Activity: Sufficiently Active (07/03/2023)   Exercise Vital Sign    Days of Exercise per Week: 3 days    Minutes of Exercise per Session: 60 min  Stress: No Stress Concern Present (07/03/2023)   [employer] of Occupational Health - Occupational Stress Questionnaire    Feeling of Stress : Not at all  Recent Concern: Stress - Stress Concern Present (05/19/2023)   [employer] of Occupational Health - Occupational Stress Questionnaire    Feeling of Stress : Rather much  Social Connections: Moderately Integrated (07/03/2023)   Social Connection and Isolation Panel    Frequency of Communication with Friends and Family: More than three times a week    Frequency of Social Gatherings with Friends and Family: Twice a week    Attends Religious Services: Never    Database administrator or Organizations: Yes    Attends Engineer, structural: More than 4 times per year    Marital Status: Married  Catering manager Violence: Not At Risk (07/03/2023)   Humiliation, Afraid, Rape, and Kick questionnaire    Fear of Current or Ex-Partner: No    Emotionally Abused: No    Physically Abused: No     Sexually Abused: No     Outpatient Medications Prior to Visit  Medication Sig Dispense Refill   Boron 6 MG TABS Take by mouth daily.     clotrimazole-betamethasone (LOTRISONE) cream Apply topically.     loperamide  (IMODIUM ) 2 MG capsule Take 1 capsule (2 mg total) by mouth as needed for diarrhea or loose stools. 30 capsule 0   mirtazapine  (REMERON ) 7.5 MG tablet Take 1 tablet (7.5 mg total) by mouth at bedtime. 90 tablet 1   Multiple Vitamin (MULTIVITAMIN WITH  MINERALS) TABS tablet Take 1 tablet by mouth 2 (two) times daily.     mupirocin ointment (BACTROBAN) 2 % SMARTSIG:1 Application Topical 2-3 Times Daily     OVER THE COUNTER MEDICATION Take 1 capsule by mouth 3 (three) times daily. Instaflex     OVER THE COUNTER MEDICATION Take 250 mg by mouth daily. Bio Magnesium  Citrate     OVER THE COUNTER MEDICATION Take 20,000 mcg by mouth daily. Vitamin B12-methylfolate     OVER THE COUNTER MEDICATION Take 800 mcg by mouth daily. Active B Folate     OVER THE COUNTER MEDICATION 2 tablets daily. VitaPrime     OVER THE COUNTER MEDICATION 1,000 mg daily. Super Bio-C buffered     OVER THE COUNTER MEDICATION 1,000 mg daily. L'Arginine     Saw Palmetto 160 MG TABS daily in the afternoon.     tadalafil  (CIALIS ) 20 MG tablet TAKE ONE TABLET DAILY AS NEEDED FOR ERECTILE DYSFUNCTION 90 tablet 3   tamsulosin  (FLOMAX ) 0.4 MG CAPS capsule Take 2 capsules (0.8 mg total) by mouth daily. 90 capsule 1   Testosterone  75 MG PLLT by Implant route.     thyroid  (NP THYROID ) 60 MG tablet Take 1 tablet (60 mg total) by mouth daily. 90 tablet 1   traMADol  (ULTRAM ) 50 MG tablet TAKE ONE TABLET EVERY 6 HOURS AS NEEDED FOR PAIN 28 tablet 0   Zinc  Oxide 10 % OINT Apply twice daily to sacral decubitus ulcer 85 g 2   Facility-Administered Medications Prior to Visit  Medication Dose Route Frequency Provider Last Rate Last Admin   [START ON 04/28/2024] denosumab  (PROLIA ) injection 60 mg  60 mg Subcutaneous Q6 months Marylynn Verneita CROME, MD        No Known Allergies  ROS Review of Systems Negative unless indicated in HPI.    Objective:    Physical Exam Constitutional:      Appearance: Normal appearance.  HENT:     Head: Normocephalic.     Mouth/Throat:     Mouth: Mucous membranes are moist.  Cardiovascular:     Rate and Rhythm: Normal rate and regular rhythm.     Pulses: Normal pulses.     Heart sounds: Normal heart sounds.  Pulmonary:     Effort: Pulmonary effort is normal.     Breath sounds: Normal breath sounds. No stridor. No wheezing.  Skin:    Findings: Bruising and wound present.         Comments: Bruising on the face and lateral hip. Pressure ulcer on the right buttock, picture in the media. Lesion with central ulceration and yellowish slough. Surrounding skin erythematous.  Neurological:     General: No focal deficit present.     Mental Status: He is alert and oriented to person, place, and time.  Psychiatric:        Mood and Affect: Mood normal.        Behavior: Behavior normal.     BP 112/72   Pulse (!) 117   Temp 98.3 F (36.8 C)   Ht 6' (1.829 m)   Wt 154 lb 12.8 oz (70.2 kg)   SpO2 93%   BMI 20.99 kg/m  Wt Readings from Last 3 Encounters:  12/13/23 154 lb 12.8 oz (70.2 kg)  11/28/23 155 lb 9.6 oz (70.6 kg)  10/28/23 161 lb 9.6 oz (73.3 kg)     Health Maintenance  Topic Date Due   Colonoscopy  04/25/2023   COVID-19 Vaccine (4 - 2024-25 season)  12/14/2023 (Originally 01/27/2023)   INFLUENZA VACCINE  12/27/2023   Medicare Annual Wellness (AWV)  07/02/2024   DTaP/Tdap/Td (5 - Td or Tdap) 06/07/2027   Pneumococcal Vaccine: 50+ Years  Completed   Hepatitis B Vaccines  Completed   HPV VACCINES  Aged Out   Meningococcal B Vaccine  Aged Out   Hepatitis C Screening  Discontinued   Zoster Vaccines- Shingrix  Discontinued    There are no preventive care reminders to display for this patient.  Lab Results  Component Value Date   TSH 2.00 09/23/2023   Lab Results   Component Value Date   WBC 3.8 (L) 09/23/2023   HGB 12.4 (L) 09/23/2023   HCT 35.5 (L) 09/23/2023   MCV 103.9 (H) 09/23/2023   PLT 137.0 (L) 09/23/2023   Lab Results  Component Value Date   NA 140 09/23/2023   K 4.1 09/23/2023   CO2 27 09/23/2023   GLUCOSE 111 (H) 09/23/2023   BUN 12 09/23/2023   CREATININE 0.78 09/23/2023   BILITOT 1.6 (H) 09/23/2023   ALKPHOS 134 (H) 09/23/2023   AST 86 (H) 09/23/2023   ALT 66 (H) 09/23/2023   PROT 6.7 09/23/2023   ALBUMIN 4.0 09/23/2023   CALCIUM 9.3 09/23/2023   ANIONGAP 16 (H) 07/25/2023   GFR 83.98 09/23/2023   Lab Results  Component Value Date   CHOL 202 (H) 09/23/2023   Lab Results  Component Value Date   HDL 106.60 09/23/2023   Lab Results  Component Value Date   LDLCALC 84 09/23/2023   Lab Results  Component Value Date   TRIG 61.0 09/23/2023   Lab Results  Component Value Date   CHOLHDL 2 09/23/2023   Lab Results  Component Value Date   HGBA1C 4.4 (L) 09/23/2023      Assessment & Plan:  Pressure injury of right buttock, stage 3 (HCC) Assessment & Plan: Patient has ulcerative lesions with surrounding erythema and yellowish exudate in the gluteal region. - Advised to cover with nonadherent dressing. -Use tylenol  arthritis 650 mg, two tablets in the morning and two at night for pain. -Will treat with Bactrim  for 10 days Advised to use probiotics. - Suggest using cushion, pillow to alleviate pressure.  - Attempt to expedite wound care appointment.   Orders: -     CBC with Differential/Platelet -     Comprehensive metabolic panel with GFR  Other orders -     Sulfamethoxazole -Trimethoprim ; Take 1 tablet by mouth 2 (two) times daily.  Dispense: 20 tablet; Refill: 0    Follow-up: Return if symptoms worsen or fail to improve.   Magdaleno Lortie, NP

## 2023-12-13 NOTE — ED Provider Notes (Signed)
-----------------------------------------   10:58 PM on 12/13/2023 -----------------------------------------  Assuming care from Dr. Waymond.  In short, Tony Luna is a 83 y.o. male with a chief complaint of elevated LFTs.  Refer to the original H&P for additional details.  The current plan of care is to follow up CT scan and reassess.  May be appropriate for outpatient GI follow up given no associated symptoms (hx of ETOH abuse).   Clinical Course as of 12/14/23 0028  Fri Dec 13, 2023  2142 Independent review of labs, mild leukopenia, thrombocytopenia, potassium is 2.2, will replete, rest electrolyte severely deranged, his LFTs are elevated more so compared to prior.  Troponin is mildly elevated but he has no chest pain at this time, Tylenol  levels are normal. [TT]  2155 US  ABDOMEN LIMITED RUQ (LIVER/GB) Hepatic cirrhosis without focal liver lesions.  [TT]  2212 DG Chest 1 View 1. No acute process.  [TT]  Sat Dec 14, 2023  0010 Troponin I (High Sensitivity)(!): 21 Stable high-sensitivity troponin.  Will reassess patient [CF]  0025 CT ABDOMEN PELVIS W CONTRAST I independently viewed and interpreted the patient's abd/pelvis CT, as well as reviewing the radiologist's report.  I see no obvious sign of a mass or tumor of any kind.  Radiology confirmed no acute abnormalities in the abdomen and the issue appears to be cirrhosis.  The radiologist also mentions bilateral lower lobe changes that the radiologist thought was more consistent with pneumonia.  However, I reassessed the patient and he has had no respiratory issues at all, has no sign of infection, and has no complaints other than a pressure sore on his buttock about which he has already seen his PCP and received recommendations.  I do not feel that the CT findings represent pneumonia and I mention them to him and he agreed he has having no respiratory issues.  Given the lack of acute/emergent findings, the patient should be appropriate for  outpatient follow-up.  He expressed a strong desire for this and said he was not planning to stay in the hospital anyway.  His adult daughter is at bedside and agrees with the plan for outpatient follow-up.  He has seen Dr. Jinny in the past and would prefer to follow-up with him, so I provided contact information, but I also provided follow-up information with Dr. Unk (currently on-call for GI) if he has any issues reestablishing care with Dr. Jinny.  I also pointed out to him that Dr. Tullo may be able to help him get referred to GI.  The patient and daughter understand and agree with the plan. [CF]    Clinical Course User Index [CF] Gordan Huxley, MD [TT] Waymond Lorelle Cummins, MD     Medications  sodium chloride  0.9 % bolus 1,000 mL (1,000 mLs Intravenous New Bag/Given 12/13/23 2216)  thiamine  (VITAMIN B1) injection 100 mg (100 mg Intravenous Given 12/13/23 2216)  PHENObarbital  (LUMINAL) injection 130 mg (130 mg Intravenous Given 12/13/23 2217)  potassium chloride  SA (KLOR-CON  M) CR tablet 40 mEq (40 mEq Oral Given 12/13/23 2216)  iohexol  (OMNIPAQUE ) 300 MG/ML solution 100 mL (100 mLs Intravenous Contrast Given 12/13/23 2301)     ED Discharge Orders     None      Final diagnoses:  Elevated LFTs  Alcohol withdrawal syndrome with complication (HCC)  Hepatic cirrhosis, unspecified hepatic cirrhosis type, unspecified whether ascites present (HCC)  Alcoholic cirrhosis, unspecified whether ascites present (HCC)     Gordan Huxley, MD 12/14/23 0028

## 2023-12-13 NOTE — Patient Instructions (Signed)
 You visited us  today due to persistent pain from a buttock sore that has been troubling you for a couple of months. We discussed pain management and treatment options to help alleviate your discomfort and improve your quality of life.  YOUR PLAN:  CHRONIC BUTTOCK SORE: You have a chronic sore on your buttock that is causing significant pain and affecting your sleep. Over-the-counter treatments have not provided sufficient relief. -We have prescribed Bactrim  to address any potential infection. -Blood work has been ordered to check for inflammation and infection markers. -You should take Tylenol  Arthritis 650 mg, two tablets in the morning and two at night for pain relief. -Allow the sore to air dry as much as possible and minimize clothing over the area. -Use a wedge pillow to help alleviate pressure on the sore. -We will try to expedite your wound care appointment.

## 2023-12-13 NOTE — Telephone Encounter (Signed)
 Left message with the wound care center to try to get this Patient's 01/02/24 appointment sooner per [facility].

## 2023-12-13 NOTE — Telephone Encounter (Signed)
 CRITICAL VALUE STICKER  CRITICAL VALUE: Vit D > 120  RECEIVER (on-site recipient of call): Sharene ORN, CMA  DATE & TIME NOTIFIED: 12/13/23 @ 4:48pm  MESSENGER (representative from lab): Ernst  MD NOTIFIED: Leron Glance, NP  TIME OF NOTIFICATION: 4:50pm  RESPONSE:  Pt states that he takes several supplements daily.   Per Leron, pt advised to D/C Vit D supplement.   See additional labs: pt was advised to go to ED due to elevate liver enzymes, bilirubin, & low Potassium.  Pt was agreeable to go to ED. Pt lives at [facility].

## 2023-12-13 NOTE — Progress Notes (Signed)
 Please call pt: He has elevated bilirubin and liver enzymes. Low potassium. He need to go to ED for further evaluation and work.

## 2023-12-14 LAB — HEPATITIS PANEL, ACUTE
HCV Ab: NONREACTIVE
Hep A IgM: NONREACTIVE
Hep B C IgM: NONREACTIVE
Hepatitis B Surface Ag: NONREACTIVE

## 2023-12-14 LAB — TROPONIN I (HIGH SENSITIVITY): Troponin I (High Sensitivity): 21 ng/L — ABNORMAL HIGH (ref <18)

## 2023-12-14 NOTE — Discharge Instructions (Signed)
 As we discussed, although your lab work is abnormal, it appears to be due to long-term damage to your liver from alcohol use rather than from a tumor or other more acute issue.  It is important that you follow-up with Dr. Jinny, whom you have seen previously, or with Dr. Unk or another GI specialist.  They may refer you to another major Medical Center such as [facility] where they have liver specialist, but they may also be able to help manage your disease process.  Continue with your regular medications.  Follow-up at the next available opportunity and return to the emergency department if you develop new or worsening symptoms that concern you.

## 2023-12-15 ENCOUNTER — Ambulatory Visit: Payer: Self-pay | Admitting: Internal Medicine

## 2023-12-16 NOTE — Telephone Encounter (Signed)
 Copied from CRM [id number]. Topic: General - Other >> Dec 16, 2023 12:53 PM Robinson H wrote: Reason for CRM: Patient was calling to schedule 2 week follow up appointment with Dr. Marylynn, no availability until September but agent seen office note trying to get patient in sooner, advised patient office will call him back.

## 2023-12-17 ENCOUNTER — Other Ambulatory Visit: Payer: Self-pay | Admitting: Internal Medicine

## 2023-12-17 ENCOUNTER — Other Ambulatory Visit

## 2023-12-17 DIAGNOSIS — K7460 Unspecified cirrhosis of liver: Secondary | ICD-10-CM

## 2023-12-17 DIAGNOSIS — R7401 Elevation of levels of liver transaminase levels: Secondary | ICD-10-CM

## 2023-12-17 NOTE — Telephone Encounter (Signed)
 Called [facility] wound care to see if we could get a sooner appointment and the lady said she just had 2 cancellations and she will call the Patient to see if he can come in on 12/18/23 at 8:30 or next week.

## 2023-12-17 NOTE — Telephone Encounter (Signed)
 Patient was called to schedule labs/per Dr Marylynn.  Patient wanted Dr Marylynn to know that his wound on his rt butt cheek is still very painful. He can't see the wound. He says that he doesn't think if is draining, and does not know what it looks like.  He wanted to know if there is something else he could put on sore for the pain.

## 2023-12-17 NOTE — Telephone Encounter (Signed)
 Left message for Patient to call the office back regarding Dr. Lula message.

## 2023-12-17 NOTE — Telephone Encounter (Signed)
 I called Dorothe at [facility] wound care and let her know the Patient could come in the morning at 8:30. Dorothe states that the Patient has to call and confirm the appointment for in the morning. Left the Patient a message letting him know to call the wound care at [phone] and let them know that he is confirming the appointment.

## 2023-12-17 NOTE — Telephone Encounter (Signed)
 Left message for the Patient to call [facility] wound careat [phone]  and confirm the appointment for 12/18/23 at 8:30.

## 2023-12-17 NOTE — Telephone Encounter (Signed)
 Attempted to call Patient no answer or voicemail to leave a message. Will try to call later today.

## 2023-12-17 NOTE — Telephone Encounter (Signed)
 Noted! Thank you

## 2023-12-17 NOTE — Telephone Encounter (Signed)
 Spoke to Patient and he stated he could go to wound care on 12/18/23 at 8:30.

## 2023-12-17 NOTE — Telephone Encounter (Signed)
 Patient called and confirmed the wound care appointment.

## 2023-12-18 ENCOUNTER — Encounter: Attending: Physician Assistant | Admitting: Physician Assistant

## 2023-12-18 DIAGNOSIS — A419 Sepsis, unspecified organism: Secondary | ICD-10-CM | POA: Diagnosis not present

## 2023-12-18 DIAGNOSIS — R918 Other nonspecific abnormal finding of lung field: Secondary | ICD-10-CM | POA: Diagnosis not present

## 2023-12-18 DIAGNOSIS — L89313 Pressure ulcer of right buttock, stage 3: Secondary | ICD-10-CM | POA: Diagnosis not present

## 2023-12-18 DIAGNOSIS — K7290 Hepatic failure, unspecified without coma: Secondary | ICD-10-CM | POA: Diagnosis not present

## 2023-12-18 DIAGNOSIS — J181 Lobar pneumonia, unspecified organism: Secondary | ICD-10-CM | POA: Diagnosis not present

## 2023-12-18 DIAGNOSIS — L89323 Pressure ulcer of left buttock, stage 3: Secondary | ICD-10-CM | POA: Diagnosis not present

## 2023-12-18 DIAGNOSIS — I700 Atherosclerosis of aorta: Secondary | ICD-10-CM | POA: Diagnosis not present

## 2023-12-18 DIAGNOSIS — J9601 Acute respiratory failure with hypoxia: Secondary | ICD-10-CM | POA: Diagnosis not present

## 2023-12-18 DIAGNOSIS — J479 Bronchiectasis, uncomplicated: Secondary | ICD-10-CM | POA: Diagnosis not present

## 2023-12-18 DIAGNOSIS — R0602 Shortness of breath: Secondary | ICD-10-CM | POA: Diagnosis not present

## 2023-12-18 DIAGNOSIS — J984 Other disorders of lung: Secondary | ICD-10-CM | POA: Diagnosis not present

## 2023-12-20 ENCOUNTER — Telehealth: Payer: Self-pay

## 2023-12-20 ENCOUNTER — Ambulatory Visit: Payer: Self-pay | Admitting: Internal Medicine

## 2023-12-20 ENCOUNTER — Other Ambulatory Visit (INDEPENDENT_AMBULATORY_CARE_PROVIDER_SITE_OTHER)

## 2023-12-20 ENCOUNTER — Inpatient Hospital Stay
Admission: EM | Admit: 2023-12-20 | Discharge: 2023-12-22 | DRG: 871 | Disposition: A | Discharge status: Home Health | Attending: Internal Medicine | Admitting: Internal Medicine

## 2023-12-20 ENCOUNTER — Emergency Department

## 2023-12-20 ENCOUNTER — Other Ambulatory Visit: Payer: Self-pay

## 2023-12-20 DIAGNOSIS — Z66 Do not resuscitate: Secondary | ICD-10-CM | POA: Diagnosis present

## 2023-12-20 DIAGNOSIS — Z8261 Family history of arthritis: Secondary | ICD-10-CM | POA: Diagnosis not present

## 2023-12-20 DIAGNOSIS — K7460 Unspecified cirrhosis of liver: Secondary | ICD-10-CM | POA: Diagnosis not present

## 2023-12-20 DIAGNOSIS — K760 Fatty (change of) liver, not elsewhere classified: Secondary | ICD-10-CM | POA: Diagnosis present

## 2023-12-20 DIAGNOSIS — N138 Other obstructive and reflux uropathy: Secondary | ICD-10-CM | POA: Diagnosis present

## 2023-12-20 DIAGNOSIS — Z9181 History of falling: Secondary | ICD-10-CM | POA: Diagnosis not present

## 2023-12-20 DIAGNOSIS — L8990 Pressure ulcer of unspecified site, unspecified stage: Secondary | ICD-10-CM | POA: Insufficient documentation

## 2023-12-20 DIAGNOSIS — I2489 Other forms of acute ischemic heart disease: Secondary | ICD-10-CM | POA: Diagnosis not present

## 2023-12-20 DIAGNOSIS — W19XXXA Unspecified fall, initial encounter: Secondary | ICD-10-CM | POA: Diagnosis present

## 2023-12-20 DIAGNOSIS — R0602 Shortness of breath: Secondary | ICD-10-CM | POA: Diagnosis not present

## 2023-12-20 DIAGNOSIS — S2020XA Contusion of thorax, unspecified, initial encounter: Secondary | ICD-10-CM

## 2023-12-20 DIAGNOSIS — T148XXA Other injury of unspecified body region, initial encounter: Secondary | ICD-10-CM

## 2023-12-20 DIAGNOSIS — S0083XA Contusion of other part of head, initial encounter: Secondary | ICD-10-CM

## 2023-12-20 DIAGNOSIS — R58 Hemorrhage, not elsewhere classified: Secondary | ICD-10-CM

## 2023-12-20 DIAGNOSIS — J189 Pneumonia, unspecified organism: Secondary | ICD-10-CM

## 2023-12-20 DIAGNOSIS — F1020 Alcohol dependence, uncomplicated: Secondary | ICD-10-CM | POA: Diagnosis not present

## 2023-12-20 DIAGNOSIS — N529 Male erectile dysfunction, unspecified: Secondary | ICD-10-CM | POA: Diagnosis present

## 2023-12-20 DIAGNOSIS — J9601 Acute respiratory failure with hypoxia: Secondary | ICD-10-CM | POA: Diagnosis not present

## 2023-12-20 DIAGNOSIS — F1019 Alcohol abuse with unspecified alcohol-induced disorder: Secondary | ICD-10-CM | POA: Insufficient documentation

## 2023-12-20 DIAGNOSIS — J479 Bronchiectasis, uncomplicated: Secondary | ICD-10-CM | POA: Diagnosis not present

## 2023-12-20 DIAGNOSIS — N401 Enlarged prostate with lower urinary tract symptoms: Secondary | ICD-10-CM | POA: Diagnosis present

## 2023-12-20 DIAGNOSIS — Z833 Family history of diabetes mellitus: Secondary | ICD-10-CM | POA: Diagnosis not present

## 2023-12-20 DIAGNOSIS — Z961 Presence of intraocular lens: Secondary | ICD-10-CM | POA: Diagnosis present

## 2023-12-20 DIAGNOSIS — Z96652 Presence of left artificial knee joint: Secondary | ICD-10-CM | POA: Diagnosis present

## 2023-12-20 DIAGNOSIS — K7031 Alcoholic cirrhosis of liver with ascites: Secondary | ICD-10-CM | POA: Diagnosis present

## 2023-12-20 DIAGNOSIS — J984 Other disorders of lung: Secondary | ICD-10-CM | POA: Diagnosis not present

## 2023-12-20 DIAGNOSIS — K7040 Alcoholic hepatic failure without coma: Secondary | ICD-10-CM | POA: Diagnosis not present

## 2023-12-20 DIAGNOSIS — Y92008 Other place in unspecified non-institutional (private) residence as the place of occurrence of the external cause: Secondary | ICD-10-CM | POA: Diagnosis not present

## 2023-12-20 DIAGNOSIS — D539 Nutritional anemia, unspecified: Secondary | ICD-10-CM | POA: Diagnosis present

## 2023-12-20 DIAGNOSIS — E039 Hypothyroidism, unspecified: Secondary | ICD-10-CM | POA: Diagnosis present

## 2023-12-20 DIAGNOSIS — D6959 Other secondary thrombocytopenia: Secondary | ICD-10-CM | POA: Diagnosis not present

## 2023-12-20 DIAGNOSIS — L89153 Pressure ulcer of sacral region, stage 3: Secondary | ICD-10-CM | POA: Diagnosis not present

## 2023-12-20 DIAGNOSIS — Z822 Family history of deafness and hearing loss: Secondary | ICD-10-CM

## 2023-12-20 DIAGNOSIS — K7010 Alcoholic hepatitis without ascites: Secondary | ICD-10-CM | POA: Diagnosis present

## 2023-12-20 DIAGNOSIS — Z720 Tobacco use: Secondary | ICD-10-CM

## 2023-12-20 DIAGNOSIS — R7401 Elevation of levels of liver transaminase levels: Secondary | ICD-10-CM

## 2023-12-20 DIAGNOSIS — Z9842 Cataract extraction status, left eye: Secondary | ICD-10-CM

## 2023-12-20 DIAGNOSIS — D638 Anemia in other chronic diseases classified elsewhere: Secondary | ICD-10-CM | POA: Diagnosis present

## 2023-12-20 DIAGNOSIS — I700 Atherosclerosis of aorta: Secondary | ICD-10-CM | POA: Diagnosis not present

## 2023-12-20 DIAGNOSIS — S0510XA Contusion of eyeball and orbital tissues, unspecified eye, initial encounter: Secondary | ICD-10-CM

## 2023-12-20 DIAGNOSIS — R918 Other nonspecific abnormal finding of lung field: Secondary | ICD-10-CM | POA: Diagnosis not present

## 2023-12-20 DIAGNOSIS — A419 Sepsis, unspecified organism: Secondary | ICD-10-CM | POA: Diagnosis not present

## 2023-12-20 DIAGNOSIS — Z1152 Encounter for screening for COVID-19: Secondary | ICD-10-CM | POA: Diagnosis not present

## 2023-12-20 DIAGNOSIS — F1090 Alcohol use, unspecified, uncomplicated: Secondary | ICD-10-CM | POA: Insufficient documentation

## 2023-12-20 DIAGNOSIS — F10188 Alcohol abuse with other alcohol-induced disorder: Secondary | ICD-10-CM | POA: Insufficient documentation

## 2023-12-20 DIAGNOSIS — K7030 Alcoholic cirrhosis of liver without ascites: Secondary | ICD-10-CM | POA: Diagnosis not present

## 2023-12-20 DIAGNOSIS — K7290 Hepatic failure, unspecified without coma: Secondary | ICD-10-CM | POA: Diagnosis not present

## 2023-12-20 DIAGNOSIS — R7989 Other specified abnormal findings of blood chemistry: Secondary | ICD-10-CM

## 2023-12-20 DIAGNOSIS — Z810 Family history of intellectual disabilities: Secondary | ICD-10-CM

## 2023-12-20 DIAGNOSIS — R030 Elevated blood-pressure reading, without diagnosis of hypertension: Secondary | ICD-10-CM | POA: Diagnosis present

## 2023-12-20 DIAGNOSIS — J181 Lobar pneumonia, unspecified organism: Secondary | ICD-10-CM | POA: Diagnosis not present

## 2023-12-20 HISTORY — DX: Pneumonia, unspecified organism: J18.9

## 2023-12-20 LAB — BASIC METABOLIC PANEL WITH GFR
Anion gap: 17 — ABNORMAL HIGH (ref 5–15)
BUN: 24 mg/dL — ABNORMAL HIGH (ref 8–23)
CO2: 19 mmol/L — ABNORMAL LOW (ref 22–32)
Calcium: 8.7 mg/dL — ABNORMAL LOW (ref 8.9–10.3)
Chloride: 101 mmol/L (ref 98–111)
Creatinine, Ser: 0.72 mg/dL (ref 0.61–1.24)
GFR, Estimated: >60 mL/min (ref >60)
Glucose, Bld: 115 mg/dL — ABNORMAL HIGH (ref 70–99)
Potassium: 4.4 mmol/L (ref 3.5–5.1)
Sodium: 137 mmol/L (ref 135–145)

## 2023-12-20 LAB — CBC
HCT: 33.8 % — ABNORMAL LOW (ref 39.0–52.0)
Hemoglobin: 11.6 g/dL — ABNORMAL LOW (ref 13.0–17.0)
MCH: 36.6 pg — ABNORMAL HIGH (ref 26.0–34.0)
MCHC: 34.3 g/dL (ref 30.0–36.0)
MCV: 106.6 fL — ABNORMAL HIGH (ref 80.0–100.0)
Platelets: 149 K/uL — ABNORMAL LOW (ref 150–400)
RBC: 3.17 MIL/uL — ABNORMAL LOW (ref 4.22–5.81)
RDW: 13.1 % (ref 11.5–15.5)
WBC: 8.1 K/uL (ref 4.0–10.5)
nRBC: 0 % (ref 0.0–0.2)

## 2023-12-20 LAB — COMPREHENSIVE METABOLIC PANEL WITH GFR
ALT: 121 U/L — ABNORMAL HIGH (ref 0–53)
AST: 266 U/L — ABNORMAL HIGH (ref 0–37)
Albumin: 3.1 g/dL — ABNORMAL LOW (ref 3.5–5.2)
Alkaline Phosphatase: 202 U/L — ABNORMAL HIGH (ref 39–117)
BUN: 23 mg/dL (ref 6–23)
CO2: 22 meq/L (ref 19–32)
Calcium: 8.5 mg/dL (ref 8.4–10.5)
Chloride: 100 meq/L (ref 96–112)
Creatinine, Ser: 0.87 mg/dL (ref 0.40–1.50)
GFR: 81.12 mL/min (ref >60.00)
Glucose, Bld: 110 mg/dL — ABNORMAL HIGH (ref 70–99)
Potassium: 4 meq/L (ref 3.5–5.1)
Sodium: 136 meq/L (ref 135–145)
Total Bilirubin: 10.3 mg/dL — ABNORMAL HIGH (ref 0.2–1.2)
Total Protein: 5.7 g/dL — ABNORMAL LOW (ref 6.0–8.3)

## 2023-12-20 LAB — CBC WITH DIFFERENTIAL/PLATELET
Basophils Absolute: 0 K/uL (ref 0.0–0.1)
Basophils Relative: 0.5 % (ref 0.0–3.0)
Eosinophils Absolute: 0 K/uL (ref 0.0–0.7)
Eosinophils Relative: 0.5 % (ref 0.0–5.0)
HCT: 32.3 % — ABNORMAL LOW (ref 39.0–52.0)
Hemoglobin: 11.2 g/dL — ABNORMAL LOW (ref 13.0–17.0)
Lymphocytes Relative: 7.2 % — ABNORMAL LOW (ref 12.0–46.0)
Lymphs Abs: 0.4 K/uL — ABNORMAL LOW (ref 0.7–4.0)
MCHC: 34.6 g/dL (ref 30.0–36.0)
MCV: 107.1 fl — ABNORMAL HIGH (ref 78.0–100.0)
Monocytes Absolute: 0.4 K/uL (ref 0.1–1.0)
Monocytes Relative: 7.3 % (ref 3.0–12.0)
Neutro Abs: 5.1 K/uL (ref 1.4–7.7)
Neutrophils Relative %: 84.5 % — ABNORMAL HIGH (ref 43.0–77.0)
Platelets: 134 K/uL — ABNORMAL LOW (ref 150.0–400.0)
RBC: 3.02 Mil/uL — ABNORMAL LOW (ref 4.22–5.81)
RDW: 13.5 % (ref 11.5–15.5)
WBC: 6.1 K/uL (ref 4.0–10.5)

## 2023-12-20 LAB — TROPONIN I (HIGH SENSITIVITY)
Troponin I (High Sensitivity): 20 ng/L — ABNORMAL HIGH (ref <18)
Troponin I (High Sensitivity): 20 ng/L — ABNORMAL HIGH (ref <18)

## 2023-12-20 LAB — HEPATIC FUNCTION PANEL
ALT: 132 U/L — ABNORMAL HIGH (ref 0–44)
AST: 318 U/L — ABNORMAL HIGH (ref 15–41)
Albumin: 2.7 g/dL — ABNORMAL LOW (ref 3.5–5.0)
Alkaline Phosphatase: 184 U/L — ABNORMAL HIGH (ref 38–126)
Bilirubin, Direct: 5.9 mg/dL — ABNORMAL HIGH (ref 0.0–0.2)
Indirect Bilirubin: 5.2 mg/dL — ABNORMAL HIGH (ref 0.3–0.9)
Total Bilirubin: 11.1 mg/dL — ABNORMAL HIGH (ref 0.0–1.2)
Total Protein: 6.1 g/dL — ABNORMAL LOW (ref 6.5–8.1)

## 2023-12-20 LAB — SARS CORONAVIRUS 2 BY RT PCR: SARS Coronavirus 2 by RT PCR: NEGATIVE

## 2023-12-20 LAB — LACTIC ACID, PLASMA
Lactic Acid, Venous: 2 mmol/L (ref 0.5–1.9)
Lactic Acid, Venous: 2.4 mmol/L (ref 0.5–1.9)
Lactic Acid, Venous: 2.9 mmol/L (ref 0.5–1.9)

## 2023-12-20 LAB — PROTIME-INR
INR: 1.3 — ABNORMAL HIGH (ref 0.8–1.2)
Prothrombin Time: 17.4 s — ABNORMAL HIGH (ref 11.4–15.2)

## 2023-12-20 LAB — APTT: aPTT: 30 s (ref 24–36)

## 2023-12-20 LAB — MRSA NEXT GEN BY PCR, NASAL: MRSA by PCR Next Gen: NOT DETECTED

## 2023-12-20 LAB — LIPASE, BLOOD: Lipase: 45 U/L (ref 11–51)

## 2023-12-20 MED ORDER — ACETAMINOPHEN 650 MG RE SUPP: 650.0000 mg | Freq: Four times a day (QID) | RECTAL | Status: DC | PRN

## 2023-12-20 MED ORDER — FOLIC ACID 1 MG PO TABS
1.0000 mg | ORAL_TABLET | Freq: Every day | ORAL | Status: DC
  Administered 2023-12-20 – 2023-12-22 (×3): 1 mg via ORAL
  Filled 2023-12-20 (×3): qty 1

## 2023-12-20 MED ORDER — THIAMINE MONONITRATE 100 MG PO TABS
100.0000 mg | ORAL_TABLET | Freq: Every day | ORAL | Status: DC
  Administered 2023-12-20 – 2023-12-21 (×2): 100 mg via ORAL
  Filled 2023-12-20 (×3): qty 1

## 2023-12-20 MED ORDER — SODIUM CHLORIDE 0.9 % IV SOLN
2.0000 g | INTRAVENOUS | Status: DC
  Administered 2023-12-21 – 2023-12-22 (×2): 2 g via INTRAVENOUS
  Filled 2023-12-20 (×2): qty 20

## 2023-12-20 MED ORDER — LORAZEPAM 1 MG PO TABS: 1.0000 mg | ORAL_TABLET | ORAL | Status: DC | PRN

## 2023-12-20 MED ORDER — LORAZEPAM 2 MG/ML IJ SOLN: 1.0000 mg | INTRAMUSCULAR | Status: DC | PRN

## 2023-12-20 MED ORDER — SENNOSIDES-DOCUSATE SODIUM 8.6-50 MG PO TABS: 1.0000 | ORAL_TABLET | Freq: Every evening | ORAL | Status: DC | PRN

## 2023-12-20 MED ORDER — THIAMINE HCL 100 MG/ML IJ SOLN
100.0000 mg | Freq: Every day | INTRAMUSCULAR | Status: DC
  Administered 2023-12-22: 100 mg via INTRAVENOUS
  Filled 2023-12-20: qty 2

## 2023-12-20 MED ORDER — ONDANSETRON HCL 4 MG PO TABS
4.0000 mg | ORAL_TABLET | Freq: Four times a day (QID) | ORAL | Status: DC | PRN
Start: 2023-12-20 — End: 2023-12-25

## 2023-12-20 MED ORDER — SODIUM CHLORIDE 0.9 % IV SOLN
2.0000 g | Freq: Once | INTRAVENOUS | Status: AC
  Administered 2023-12-20: 2 g via INTRAVENOUS
  Filled 2023-12-20: qty 20

## 2023-12-20 MED ORDER — SODIUM CHLORIDE 0.9 % IV SOLN
500.0000 mg | Freq: Once | INTRAVENOUS | Status: AC
  Administered 2023-12-20: 500 mg via INTRAVENOUS
  Filled 2023-12-20: qty 5

## 2023-12-20 MED ORDER — ADULT MULTIVITAMIN W/MINERALS CH
1.0000 | ORAL_TABLET | Freq: Every day | ORAL | Status: DC
  Administered 2023-12-20 – 2023-12-22 (×3): 1 via ORAL
  Filled 2023-12-20 (×3): qty 1

## 2023-12-20 MED ORDER — ACETAMINOPHEN 325 MG PO TABS
650.0000 mg | ORAL_TABLET | Freq: Four times a day (QID) | ORAL | Status: DC | PRN
  Administered 2023-12-21: 650 mg via ORAL
  Filled 2023-12-20: qty 2

## 2023-12-20 MED ORDER — ONDANSETRON HCL 4 MG/2ML IJ SOLN: 4.0000 mg | Freq: Four times a day (QID) | INTRAMUSCULAR | Status: DC | PRN

## 2023-12-20 MED ORDER — SODIUM CHLORIDE 0.9 % IV SOLN
500.0000 mg | INTRAVENOUS | Status: DC
  Administered 2023-12-21 – 2023-12-22 (×2): 500 mg via INTRAVENOUS
  Filled 2023-12-20 (×2): qty 5

## 2023-12-20 MED ORDER — HEPARIN SODIUM (PORCINE) 5000 UNIT/ML IJ SOLN
5000.0000 [IU] | Freq: Three times a day (TID) | INTRAMUSCULAR | Status: DC
  Administered 2023-12-20 – 2023-12-22 (×5): 5000 [IU] via SUBCUTANEOUS
  Filled 2023-12-20 (×5): qty 1

## 2023-12-20 NOTE — Hospital Course (Addendum)
 Mr. Tony Luna is a 83 year old male with history of fatty liver, cirrhosis, alcohol use disorder, hypothyroid, sleep apnea, dysrhythmia, history of esophageal tear, who presents ED for chief concerns of shortness of breath.  Vitals in the ED showed T of 98.1, rr 21, hr 99, blood pressure 145/70, SpO2 97% on room air.  Serum sodium is 137, potassium 4.4, chloride 101, bicarb 19, BUN of 24, serum creatinine 0.72, EGFR greater than 60, nonfasting glucose 115, WBC 8.1, hemoglobin 11.8, platelets of 149.  HS troponin is 20. COVID PCR was negative.  ED treatment: Azithromycin  500 mg IV one-time dose, ceftriaxone 2 g IV one-time dose.

## 2023-12-20 NOTE — Assessment & Plan Note (Addendum)
 Outpatient workup acetaminophen  level was negative, acute hepatitis panel was negative I suspect this is secondary to chronic alcohol use Hepatic function is in process

## 2023-12-20 NOTE — Assessment & Plan Note (Signed)
 Sepsis cannot be ruled out at this time Patient had sinus tachycardia, with hypoxia SpO2 at 96 requiring 2 L nasal cannula Initial lactic acid is elevated at 2.0.   Blood cultures x 2 are in process.

## 2023-12-20 NOTE — Assessment & Plan Note (Addendum)
 Azithromycin  500 mg IV daily, ceftriaxone 2 g IV daily to complete a 5-day course Check MRSA PCR Incentive spirometry, flutter valve

## 2023-12-20 NOTE — Assessment & Plan Note (Signed)
 Query secondary to liver cirrhosis Blood cultures have been ordered MRSA pending collection Continue broad-spectrum per above Repeat third lactic acid at 2030 on day of admission

## 2023-12-20 NOTE — H&P (Addendum)
 History and Physical   Tony Luna FMW:[id number] DOB: November 20, 1940 DOA: 12/20/2023  PCP: Marylynn Verneita CROME, MD  Outpatient Specialists: Dr. Clista, medical oncology/hematology Patient coming from: Shortness of breath  I have personally briefly reviewed patient's old medical records in [facility] EMR.  Chief Concern: Shortness of breath  HPI: Tony Luna is a 83 year old male with history of fatty liver, cirrhosis, alcohol use disorder, hypothyroid, sleep apnea, dysrhythmia, history of esophageal tear, who presents ED for chief concerns of shortness of breath.  Vitals in the ED showed T of 98.1, rr 21, hr 99, blood pressure 145/70, SpO2 97% on room air.  Serum sodium is 137, potassium 4.4, chloride 101, bicarb 19, BUN of 24, serum creatinine 0.72, EGFR greater than 60, nonfasting glucose 115, WBC 8.1, hemoglobin 11.8, platelets of 149.  HS troponin is 20. COVID PCR was negative.  ED treatment: Azithromycin  500 mg IV one-time dose, ceftriaxone 2 g IV one-time dose. ---------------------------------- At bedside, patient able to tell me his first and last name, age, location, current calendar year.  He reports he has been short of breath for the last 2 to 3 days.  He reports no cough, nausea, vomiting, fever, chills, chest pain, abdominal pain, dysuria, hematuria, diarrhea.  He denies swelling of his lower extremity, syncope, loss of consciousness.  He endorsed being unsteady on his feet.  Increased falls over the last few weeks.  He reports no baseline use of oxygen.  He reports no history of tremors if he does not drink alcohol.  He denies history of seizures.  Social history: He lives at home with his wife.  He denies smoking cigarettes.  He endorses chewing tobacco.  He denies recreational drug use.  He drinks alcohol and his preferred beverage is Chardonnay.  He states that he usually starts with 8 to 10 ounces of Chardonnay for lunch and 8 to 10 ounces of Chardonnay at the night  On his porch.  Daughter at bedside states that he does drink bourbon occasionally.  ROS: Constitutional: no weight change, no fever ENT/Mouth: no sore throat, no rhinorrhea Eyes: no eye pain, no vision changes Cardiovascular: no chest pain, + dyspnea,  no edema, no palpitations Respiratory: no cough, no sputum, no wheezing Gastrointestinal: no nausea, no vomiting, no diarrhea, no constipation Genitourinary: no urinary incontinence, no dysuria, no hematuria Musculoskeletal: no arthralgias, no myalgias Skin: no skin lesions, no pruritus, Neuro: + weakness, no loss of consciousness, no syncope Psych: no anxiety, no depression, no decrease appetite Heme/Lymph: no bruising, no bleeding  ED Course: Discussed with EDP, patient requiring hospitalization for chief concerns of acute hypoxic respiratory failure, suspected commune acquired pneumonia.  Assessment/Plan  Principal Problem:   Acute hypoxic respiratory failure (HCC) Active Problems:   CAP (community acquired pneumonia)   LFT elevation   SIRS due to infectious process with acute organ dysfunction (HCC)   Cirrhosis (HCC)   Elevated troponin   ED (erectile dysfunction) of organic origin   White coat syndrome with high blood pressure without hypertension   Ecchymosis of eye, initial encounter   Ecchymosis on examination   Traumatic ecchymosis   Traumatic ecchymosis of cheek   Benign prostatic hyperplasia with urinary obstruction   Alcohol use disorder   Lactic acid blood increased   Pressure injury of skin   Assessment and Plan:  * Acute hypoxic respiratory failure (HCC) Suspect secondary to commune acquired pneumonia in setting of continued alcohol use  Continuous pulse oximetry Continue oxygen supplementation to maintain SpO2  greater than 92% Aspiration precaution, check MRSA PCR Ceftriaxone 2 g IV daily, azithromycin  500 mg IV daily, to complete a 5-day course  SIRS due to infectious process with acute organ dysfunction  (HCC) Sepsis cannot be ruled out at this time Patient had sinus tachycardia, with hypoxia SpO2 at 96 requiring 2 L nasal cannula Initial lactic acid is elevated at 2.0.   Blood cultures x 2 are in process.  LFT elevation Outpatient workup acetaminophen  level was negative, acute hepatitis panel was negative I suspect this is secondary to chronic alcohol use Hepatic function is in process  CAP (community acquired pneumonia) Azithromycin  500 mg IV daily, ceftriaxone 2 g IV daily to complete a 5-day course Check MRSA PCR Incentive spirometry, flutter valve  Elevated troponin Secondary to demand ischemia in setting of acute hypoxic respiratory failure Low clinical suspicion for ACS at this time as patient denies chest pain and troponin mildly elevated with no positive delta  Cirrhosis (HCC) Counseled patient extensively on alcohol cessation Patient reports he is trying to cut back and he endorses understanding and compliance  Traumatic ecchymosis POA, Initial evaluation, patient fell trying to open the garage door about 1 to 2 weeks ago He denies loss of consciousness Appears to be fading and healing  Lactic acid blood increased Query secondary to liver cirrhosis Blood cultures have been ordered MRSA pending collection Continue broad-spectrum per above Repeat third lactic acid at 2030 on day of admission  Alcohol use disorder CIWA precaution initiated on admission  Chart reviewed.   DVT prophylaxis: Heparin 5000 units subcutaneous every 8 hours Code Status: DNR/DNI Diet: Heart healthy Family Communication: Updated daughter, Lauraine at bedside and wife, Rudell at bedside with patient's permission Disposition Plan: Pending clinical course Consults called: PT, OT, TOC Admission status: Telemetry medical, inpatient  Past Medical History:  Diagnosis Date   Allergy    Arthritis    Cirrhosis (HCC)    Complication of anesthesia    :need catheter   Dysrhythmia    ?  something told by reg dr  unable to say what it was   Esophageal tear    Hypothyroidism    Pneumonia    hx   Primary osteoarthritis of left knee 06/22/2014   Rotator cuff tear    Sleep apnea corrected- dental device   Past Surgical History:  Procedure Laterality Date   BACK SURGERY     blepharaplasty     CATARACT EXTRACTION W/PHACO Left 02/21/2021   Procedure: CATARACT EXTRACTION PHACO AND INTRAOCULAR LENS PLACEMENT (IOC) LEFT 4.56 00:34.9;  Surgeon: Jaye Fallow, MD;  Location: [facility];  Service: Ophthalmology;  Laterality: Left;   CAUTERY OF TURBINATES     COLONOSCOPY WITH PROPOFOL  N/A 07/19/2015   Procedure: COLONOSCOPY WITH PROPOFOL ;  Surgeon: Rogelia Copping, MD;  Location: [facility] ENDOSCOPY;  Service: Endoscopy;  Laterality: N/A;   COLONOSCOPY WITH PROPOFOL  N/A 03/21/2021   Procedure: COLONOSCOPY WITH PROPOFOL ;  Surgeon: Copping Rogelia, MD;  Location: [facility] ENDOSCOPY;  Service: Endoscopy;  Laterality: N/A;   COLONOSCOPY WITH PROPOFOL  N/A 04/18/2021   Procedure: COLONOSCOPY WITH PROPOFOL ;  Surgeon: Copping Rogelia, MD;  Location: [facility] ENDOSCOPY;  Service: Endoscopy;  Laterality: N/A;   COLONOSCOPY WITH PROPOFOL  N/A 04/24/2022   Procedure: COLONOSCOPY WITH PROPOFOL ;  Surgeon: Copping Rogelia, MD;  Location: [facility] ENDOSCOPY;  Service: Endoscopy;  Laterality: N/A;   EYE SURGERY     HERNIA REPAIR Left 20+ years ago   inguinal herniorrhapy   JOINT REPLACEMENT  left knee 2016   KNEE  SURGERY     ? 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 His smoking use included cigarettes. He started smoking about 55 years ago. He has a 15 pack-year smoking history. His smokeless tobacco  use includes chew. He reports current alcohol use of about 10.0 standard drinks of alcohol per week. He reports that he does not use drugs.  No Known Allergies Family History  Problem Relation Age of Onset   Alcohol abuse Mother    Diabetes Mother    Mental retardation Mother    Arthritis Father    Hearing loss Father    Alcohol abuse Father    Family history: Family history reviewed and not pertinent.  Prior to Admission medications   Medication Sig Start Date End Date Taking? Authorizing Provider  Boron 6 MG TABS Take by mouth daily.    [provider]  clotrimazole-betamethasone (LOTRISONE) cream Apply topically. 09/03/23   [provider]  loperamide  (IMODIUM ) 2 MG capsule Take 1 capsule (2 mg total) by mouth as needed for diarrhea or loose stools. 08/06/23   Hope Merle, MD  mirtazapine  (REMERON ) 7.5 MG tablet Take 1 tablet (7.5 mg total) by mouth at bedtime. 11/28/23   Tullo, Teresa L, MD  Multiple Vitamin (MULTIVITAMIN WITH MINERALS) TABS tablet Take 1 tablet by mouth 2 (two) times daily.    [provider]  mupirocin ointment (BACTROBAN) 2 % SMARTSIG:1 Application Topical 2-3 Times Daily 09/09/23   [provider]  OVER THE COUNTER MEDICATION Take 1 capsule by mouth 3 (three) times daily. Instaflex    [provider]  OVER THE COUNTER MEDICATION Take 250 mg by mouth daily. Bio Magnesium  Citrate    [provider]  OVER THE COUNTER MEDICATION Take 20,000 mcg by mouth daily. Vitamin B12-methylfolate    [provider]  OVER THE COUNTER MEDICATION Take 800 mcg by mouth daily. Active B Folate    [provider]  OVER THE COUNTER MEDICATION 2 tablets daily. VitaPrime    [provider]  OVER THE COUNTER MEDICATION 1,000 mg daily. Super Bio-C buffered    [provider]  OVER THE COUNTER MEDICATION 1,000 mg daily. L'Arginine    [provider]  Saw Palmetto 160 MG TABS daily in the afternoon.  05/30/19   [provider]  sulfamethoxazole -trimethoprim  (BACTRIM  DS) 800-160 MG tablet Take 1 tablet by mouth 2 (two) times daily. 12/13/23   Kaur, Charanpreet, NP  tadalafil  (CIALIS ) 20 MG tablet TAKE ONE TABLET DAILY AS NEEDED FOR ERECTILE DYSFUNCTION 11/26/23   Marylynn Verneita CROME, MD  tamsulosin  (FLOMAX ) 0.4 MG CAPS capsule Take 2 capsules (0.8 mg total) by mouth daily. 08/07/23   Hope Merle, MD  Testosterone  75 MG PLLT by Implant route.    [provider]  thyroid  (NP THYROID ) 60 MG tablet Take 1 tablet (60 mg total) by mouth daily. 10/28/23   Marylynn Verneita CROME, MD  traMADol  (ULTRAM ) 50 MG tablet TAKE ONE TABLET EVERY 6 HOURS AS NEEDED FOR PAIN 11/15/22   Webb, Padonda B, FNP  Zinc  Oxide 10 % OINT Apply twice daily to sacral decubitus ulcer 11/28/23  Marylynn Verneita CROME, MD   Physical Exam: Vitals:   12/20/23 1430 12/20/23 1500 12/20/23 1710 12/20/23 1800  BP: (!) 138/92 (!) 144/83 130/75   Pulse: 100 91 83   Resp: 12 12 18    Temp:   (!) 97.5 F (36.4 C)   TempSrc:   Oral   SpO2: 100% 99% 98%   Weight:    70.3 kg  Height:    6' (1.829 m)   Constitutional: appears frail, cachectic, malnourished Eyes: PERRL, bilateral scleral icterus ENMT: Mucous membranes are moist. Posterior pharynx clear of any exudate or lesions. Age-appropriate dentition. Hearing appropriate.   Neck: normal, supple, no masses, no thyromegaly Respiratory: clear to auscultation bilaterally, no wheezing, no crackles. Normal respiratory effort. No accessory muscle use.  Cardiovascular: Regular rate and rhythm, no murmurs / rubs / gallops. No extremity edema. 2+ pedal pulses. No carotid bruits.  Abdomen: no tenderness, no masses palpated, no hepatosplenomegaly. Bowel sounds positive.  Musculoskeletal: no clubbing / cyanosis. No joint deformity upper and lower extremities. Good ROM, no contractures, no atrophy. Normal muscle tone.  Bilateral arm extended with wrist extended negative for asterixis Skin: Mild  jaundice, no rashes, lesions, ulcers. No induration Neurologic: Sensation intact. Strength 5/5 in all 4.  Psychiatric: Normal judgment and insight. Alert and oriented x 3. Normal mood.   EKG: independently reviewed, showing sinus tachycardia with rate of 110, QTc 492  Chest x-ray on Admission: I personally reviewed and I agree with radiologist reading as below.  CT Chest Wo Contrast Result Date: 12/20/2023 CLINICAL DATA:  Respiratory illness. EXAM: CT CHEST WITHOUT CONTRAST TECHNIQUE: Multidetector CT imaging of the chest was performed following the standard protocol without IV contrast. RADIATION DOSE REDUCTION: This exam was performed according to the departmental dose-optimization program which includes automated exposure control, adjustment of the mA and/or kV according to patient size and/or use of iterative reconstruction technique. COMPARISON:  Chest radiograph dated 12/20/2023. FINDINGS: Evaluation of this exam is limited in the absence of intravenous contrast. Cardiovascular: There is no cardiomegaly or pericardial effusion. There is 3 vessel coronary vascular calcification. Mild atherosclerotic calcification of the thoracic aorta. No evidence of bowel dilatation. The central pulmonary arteries are grossly unremarkable. Mediastinum/Nodes: No hilar or mediastinal adenopathy. The esophagus is grossly unremarkable. No mediastinal fluid collection. Lungs/Pleura: Mild bilateral lower lobe bronchiectasis. Diffuse ground-glass density in the lower lobes and lower lung fields as well as interstitial and subpleural densities likely represent pneumonia. Underlying interstitial lung disease is not excluded but favored less likely. Biapical calcified pleural plaques. Trace left pleural effusion. No pneumothorax. Upper Abdomen: Fatty liver and cirrhosis. A 3 mm nonobstructing left renal stone. Musculoskeletal: Degenerative changes of the spine. No acute osseous pathology. IMPRESSION: 1. Bilateral lower lobe and  lower lung field predominant densities likely represent pneumonia. Clinical correlation and follow-up recommended. 2. Fatty liver and cirrhosis. 3. A 3 mm nonobstructing left renal stone. 4.  Aortic Atherosclerosis (ICD10-I70.0). Electronically Signed   By: Vanetta Chou M.D.   On: 12/20/2023 14:20   DG Chest 2 View Result Date: 12/20/2023 CLINICAL DATA:  Shortness of breath, liver failure EXAM: CHEST - 2 VIEW COMPARISON:  12/13/2023 FINDINGS: Lower thoracic spondylosis. Atherosclerotic calcification of the aortic arch. Biapical pleuroparenchymal scarring with associated calcifications. Reticular interstitial accentuation especially posteriorly in the lower lobes, although judged to be mildly improved from 12/13/2023. IMPRESSION: 1. Reticular interstitial accentuation especially posteriorly in the lower lobes, although judged to be mildly improved from 12/13/2023. This could reflect resolving noncardiogenic edema or resolving  infection. 2. Lower thoracic spondylosis. 3. Aortic Atherosclerosis (ICD10-I70.0). Electronically Signed   By: Ryan Salvage M.D.   On: 12/20/2023 13:15   Labs on Admission: I have personally reviewed following labs  CBC: Recent Labs  Lab 12/13/23 1912 12/20/23 1054 12/20/23 1232  WBC 3.6* 6.1 8.1  NEUTROABS  --  5.1  --   HGB 11.3* 11.2* 11.6*  HCT 31.8* 32.3* 33.8*  MCV 101.6* 107.1* 106.6*  PLT 109* 134.0* 149*   Basic Metabolic Panel: Recent Labs  Lab 12/13/23 1912 12/20/23 1054 12/20/23 1232  NA 138 136 137  K 3.2* 4.0 4.4  CL 101 100 101  CO2 20* 22 19*  GLUCOSE 101* 110* 115*  BUN 13 23 24*  CREATININE 0.67 0.87 0.72  CALCIUM 9.1 8.5 8.7*   GFR: Estimated Creatinine Clearance: 72 mL/min (by C-G formula based on SCr of 0.72 mg/dL).  Liver Function Tests: Recent Labs  Lab 12/13/23 1912 12/20/23 1054 12/20/23 1232  AST 306* 266* 318*  ALT 144* 121* 132*  ALKPHOS 165* 202* 184*  BILITOT 7.1* 10.3* 11.1*  PROT 6.6 5.7* 6.1*  ALBUMIN 3.0*  3.1* 2.7*   Recent Labs  Lab 12/20/23 1232  LIPASE 45   Coagulation Profile: Recent Labs  Lab 12/13/23 1912 12/20/23 1312  INR 1.3* 1.3*   Urine analysis:    Component Value Date/Time   COLORURINE YELLOW (A) 12/13/2023 2329   APPEARANCEUR CLEAR (A) 12/13/2023 2329   APPEARANCEUR Clear 04/23/2022 1510   LABSPEC 1.004 (L) 12/13/2023 2329   PHURINE 7.0 12/13/2023 2329   GLUCOSEU NEGATIVE 12/13/2023 2329   HGBUR NEGATIVE 12/13/2023 2329   BILIRUBINUR NEGATIVE 12/13/2023 2329   BILIRUBINUR Negative 04/23/2022 1510   KETONESUR NEGATIVE 12/13/2023 2329   PROTEINUR NEGATIVE 12/13/2023 2329   NITRITE NEGATIVE 12/13/2023 2329   LEUKOCYTESUR NEGATIVE 12/13/2023 2329   This document was prepared using Dragon Voice Recognition software and may include unintentional dictation errors.  Dr. Sherre [facility]  If 7PM-7AM, please contact overnight-coverage provider If 7AM-7PM, please contact day attending provider [email or web address]  12/20/2023, 6:05 PM

## 2023-12-20 NOTE — ED Triage Notes (Signed)
 Pt comes in via pov with complaints of SOB. Pt has a history of liver failure, and was seen by his provider whom has concerns of worsening liver failure. Pt has been short of breath the past few days, and has no complaints of chest pain or abdominal pain. Pt 86-88% on RA. Pt placed on 2L of o2. Pt currently has a bedsore on his bottom, which he complains of pain 8/10

## 2023-12-20 NOTE — Telephone Encounter (Signed)
 Pt came in for a lab appt. Upon arrival pt mentioned to lab that he needed a dressing change done. Nursing staff was notified. Pt was placed back into lobby.    I went to grab pt out of the lobby, while walking to room I noticed pt was short of breathe. I asked pt was this a new issue pt stated that it was and he noticed the shortness of breather a few days ago.   I received verbal order from pt's pcp to change dressing. After the dressing was changed I informed pt I was going to check his vitals to ensure they were stabled.  I had pt sent for aprox 5 mins while doing so pt's Oxygen was siting at 88%. I informed pt I was going to inform pcp.  While pt was sitting in exam room highest oxygen level was 94% but would not stay at this level for long.   Pcp and lead Cma both where in exam room with pt. Pt was advised to go to the hospital by pcp but refused and stated he was going home instead.   Sending to PCP for review

## 2023-12-20 NOTE — Assessment & Plan Note (Addendum)
 Secondary to demand ischemia in setting of acute hypoxic respiratory failure Low clinical suspicion for ACS at this time as patient denies chest pain and troponin mildly elevated with no positive delta

## 2023-12-20 NOTE — Assessment & Plan Note (Signed)
 CIWA precaution initiated on admission

## 2023-12-20 NOTE — Assessment & Plan Note (Addendum)
 POA, Initial evaluation, patient fell trying to open the garage door about 1 to 2 weeks ago He denies loss of consciousness Appears to be fading and healing

## 2023-12-20 NOTE — ED Provider Notes (Addendum)
 [facility] Provider Note    Event Date/Time   First MD Initiated Contact with Patient 12/20/23 1236     (approximate)   History   Chief Complaint: Shortness of Breath   HPI  Tony Luna is a 83 y.o. male with a past history of hypothyroidism, cirrhosis who comes ED due to shortness of breath over the past few weeks.  Denies orthopnea.  Does have some dyspnea with exertion.  No chest pain.  He was seen in the ED a week ago, found to have elevated LFTs, decided to go home and plan to follow-up with gastroenterology in the office.  However, symptoms have continued worsening and he has had increased jaundice during this time and was instructed to come back to the ED by his PCP.          Past Medical History:  Diagnosis Date   Allergy    Arthritis    Cirrhosis (HCC)    Complication of anesthesia    :need catheter   Dysrhythmia    ? something told by reg dr  unable to say what it was   Esophageal tear    Hypothyroidism    Pneumonia    hx   Primary osteoarthritis of left knee 06/22/2014   Rotator cuff tear    Sleep apnea corrected- dental device    Current Outpatient Rx   Order #: [id number] Class: Historical Med   Order #: [id number] Class: Historical Med   Order #: [id number] Class: Normal   Order #: [id number] Class: Normal   Order #: [id number] Class: Historical Med   Order #: [id number] Class: Historical Med   Order #: [id number] Class: Historical Med   Order #: [id number] Class: Historical Med   Order #: [id number] Class: Historical Med   Order #: [id number] Class: Historical Med   Order #: [id number] Class: Historical Med   Order #: [id number] Class: Historical Med   Order #: [id number] Class: Historical Med   Order #: [id number] Class: Historical Med   Order #: [id number] Class: Normal   Order #: [id number] Class: Normal   Order #: [id number] Class: Normal   Order #: [id number] Class: Historical Med   Order #: [id number] Class: Normal   Order #: [id number] Class:  Normal   Order #: [id number] Class: Normal    Past Surgical History:  Procedure Laterality Date   BACK SURGERY     blepharaplasty     CATARACT EXTRACTION W/PHACO Left 02/21/2021   Procedure: CATARACT EXTRACTION PHACO AND INTRAOCULAR LENS PLACEMENT (IOC) LEFT 4.56 00:34.9;  Surgeon: Jaye Fallow, MD;  Location: [facility];  Service: Ophthalmology;  Laterality: Left;   CAUTERY OF TURBINATES     COLONOSCOPY WITH PROPOFOL  N/A 07/19/2015   Procedure: COLONOSCOPY WITH PROPOFOL ;  Surgeon: Rogelia Copping, MD;  Location: [facility];  Service: Endoscopy;  Laterality: N/A;   COLONOSCOPY WITH PROPOFOL  N/A 03/21/2021   Procedure: COLONOSCOPY WITH PROPOFOL ;  Surgeon: Copping Rogelia, MD;  Location: [facility];  Service: Endoscopy;  Laterality: N/A;   COLONOSCOPY WITH PROPOFOL  N/A 04/18/2021   Procedure: COLONOSCOPY WITH PROPOFOL ;  Surgeon: Copping Rogelia, MD;  Location: [facility];  Service: Endoscopy;  Laterality: N/A;   COLONOSCOPY WITH PROPOFOL  N/A 04/24/2022   Procedure: COLONOSCOPY WITH PROPOFOL ;  Surgeon: Copping Rogelia, MD;  Location: [facility];  Service: Endoscopy;  Laterality: N/A;   EYE SURGERY     HERNIA REPAIR Left 20+ years ago   inguinal herniorrhapy   JOINT REPLACEMENT  left knee 2016   KNEE SURGERY     ? 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   Procedure: TOTAL KNEE ARTHROPLASTY;  Surgeon: Fonda SHAUNNA Olmsted, MD;  Location: [facility] OR;  Service: Orthopedics;  Laterality: Left;    Physical Exam   Triage Vital Signs: ED Triage Vitals  Encounter Vitals Group     BP 12/20/23 1229 106/67     Girls Systolic BP Percentile --      Girls Diastolic BP Percentile --      Boys Systolic BP Percentile --      Boys Diastolic BP Percentile --      Pulse Rate 12/20/23 1229 (!) 111     Resp 12/20/23 1256 (!) 21     Temp 12/20/23 1256 98.1 F (36.7 C)      Temp src --      SpO2 12/20/23 1229 (!) 87 %     Weight 12/20/23 1258 154 lb 5.2 oz (70 kg)     Height 12/20/23 1258 6' (1.829 m)     Head Circumference --      Peak Flow --      Pain Score 12/20/23 1229 8     Pain Loc --      Pain Education --      Exclude from Growth Chart --     Most recent vital signs: Vitals:   12/20/23 1229 12/20/23 1256  BP: 106/67 (!) 145/70  Pulse: (!) 111 99  Resp:  (!) 21  Temp:  98.1 F (36.7 C)  SpO2: (!) 87% 97%    General: Awake, no distress.  CV:  Good peripheral perfusion.  Regular rate rhythm Resp:  Normal effort.  Diminished breath sounds at the right base. Abd:  No distention.  Soft nontender Other:  No lower extremity edema.  Has multiple bruises over different areas of the body, denies any significant head injury   ED Results / Procedures / Treatments   Labs (all labs ordered are listed, but only abnormal results are displayed) Labs Reviewed  BASIC METABOLIC PANEL WITH GFR - Abnormal; Notable for the following components:      Result Value   CO2 19 (*)    Glucose, Bld 115 (*)    BUN 24 (*)    Calcium 8.7 (*)    Anion gap 17 (*)    All other components within normal limits  CBC - Abnormal; Notable for the following components:   RBC 3.17 (*)    Hemoglobin 11.6 (*)    HCT 33.8 (*)    MCV 106.6 (*)    MCH 36.6 (*)    Platelets 149 (*)    All other components within normal limits  HEPATIC FUNCTION PANEL - Abnormal; Notable for the following components:   Total Protein 6.1 (*)    Albumin 2.7 (*)    AST 318 (*)    ALT 132 (*)    Alkaline Phosphatase 184 (*)    Total Bilirubin 11.1 (*)    Bilirubin, Direct 5.9 (*)    Indirect Bilirubin 5.2 (*)    All other components within normal limits  PROTIME-INR - Abnormal; Notable for the following components:   Prothrombin Time 17.4 (*)    INR 1.3 (*)    All other components within normal limits  TROPONIN I (HIGH SENSITIVITY) - Abnormal; Notable for the following components:    Troponin I (High Sensitivity) 20 (*)    All other components within normal limits  SARS  CORONAVIRUS 2 BY RT PCR  CULTURE, BLOOD (ROUTINE X 2)  CULTURE, BLOOD (ROUTINE X 2)  LIPASE, BLOOD  APTT  LACTIC ACID, PLASMA  LACTIC ACID, PLASMA  TROPONIN I (HIGH SENSITIVITY)     EKG Interpreted by me Sinus tachycardia rate 110.  Normal axis, normal intervals.  Normal QRS ST segments T waves   RADIOLOGY Chest x-ray interpreted by me, unremarkable.  Radiology report reviewed CT chest shows bilateral lower lobe pneumonia.   PROCEDURES:  .Critical Care  Performed by: Viviann Pastor, MD Authorized by: Viviann Pastor, MD   Critical care provider statement:    Critical care time (minutes):  35   Critical care time was exclusive of:  Separately billable procedures and treating other patients   Critical care was necessary to treat or prevent imminent or life-threatening deterioration of the following conditions:  Sepsis, respiratory failure and hepatic failure   Critical care was time spent personally by me on the following activities:  Development of treatment plan with patient or surrogate, discussions with consultants, evaluation of patient's response to treatment, examination of patient, obtaining history from patient or surrogate, ordering and performing treatments and interventions, ordering and review of laboratory studies, ordering and review of radiographic studies, pulse oximetry, re-evaluation of patient's condition and review of old charts   Care discussed with: admitting provider      MEDICATIONS ORDERED IN ED: Medications  cefTRIAXone (ROCEPHIN) 2 g in sodium chloride  0.9 % 100 mL IVPB (has no administration in time range)  azithromycin  (ZITHROMAX ) 500 mg in sodium chloride  0.9 % 250 mL IVPB (has no administration in time range)     IMPRESSION / MDM / ASSESSMENT AND PLAN / ED COURSE  I reviewed the triage vital signs and the nursing notes.  DDx: Pleural effusion,  pulmonary edema, pneumonia, electrolyte derangement, liver failure, anemia, coagulopathy  Patient's presentation is most consistent with acute presentation with potential threat to life or bodily function.  Patient brought to the ED due to worsening shortness of breath over the past several days, found to be hypoxic on room air.  Stabilized on 2 L nasal cannula.  Also has some tachypnea, tachycardia worrisome for possible pneumonia and sepsis versus pleural effusion related to cirrhosis.  LFTs show worsening hyperbilirubinemia.  INR 1.3.  No sign of GI bleed or cytopenia.  CT chest reveals bilateral basilar pneumonia.  Will start antibiotics, plan to admit.   ----------------------------------------- 3:03 PM on 12/20/2023 ----------------------------------------- Case discussed with hospitalist      FINAL CLINICAL IMPRESSION(S) / ED DIAGNOSES   Final diagnoses:  Cirrhosis of liver without ascites, unspecified hepatic cirrhosis type (HCC)  Pneumonia of both lower lobes due to infectious organism  Acute respiratory failure with hypoxia (HCC)     Rx / DC Orders   ED Discharge Orders     None        Note:  This document was prepared using Dragon voice recognition software and may include unintentional dictation errors.   Viviann Pastor, MD 12/20/23 1434    Viviann Pastor, MD 12/20/23 [phone]

## 2023-12-20 NOTE — Assessment & Plan Note (Signed)
 Counseled patient extensively on alcohol cessation Patient reports he is trying to cut back and he endorses understanding and compliance

## 2023-12-20 NOTE — Assessment & Plan Note (Signed)
 Suspect secondary to commune acquired pneumonia in setting of continued alcohol use  Continuous pulse oximetry Continue oxygen supplementation to maintain SpO2 greater than 92% Aspiration precaution, check MRSA PCR Ceftriaxone 2 g IV daily, azithromycin  500 mg IV daily, to complete a 5-day course

## 2023-12-21 DIAGNOSIS — J9601 Acute respiratory failure with hypoxia: Secondary | ICD-10-CM | POA: Diagnosis not present

## 2023-12-21 LAB — CBC
HCT: 26.6 % — ABNORMAL LOW (ref 39.0–52.0)
Hemoglobin: 9.1 g/dL — ABNORMAL LOW (ref 13.0–17.0)
MCH: 36.1 pg — ABNORMAL HIGH (ref 26.0–34.0)
MCHC: 34.2 g/dL (ref 30.0–36.0)
MCV: 105.6 fL — ABNORMAL HIGH (ref 80.0–100.0)
Platelets: 102 K/uL — ABNORMAL LOW (ref 150–400)
RBC: 2.52 MIL/uL — ABNORMAL LOW (ref 4.22–5.81)
RDW: 13.2 % (ref 11.5–15.5)
WBC: 4.7 K/uL (ref 4.0–10.5)
nRBC: 0 % (ref 0.0–0.2)

## 2023-12-21 LAB — BASIC METABOLIC PANEL WITH GFR
Anion gap: 10 (ref 5–15)
BUN: 25 mg/dL — ABNORMAL HIGH (ref 8–23)
CO2: 23 mmol/L (ref 22–32)
Calcium: 8.6 mg/dL — ABNORMAL LOW (ref 8.9–10.3)
Chloride: 103 mmol/L (ref 98–111)
Creatinine, Ser: 0.66 mg/dL (ref 0.61–1.24)
GFR, Estimated: >60 mL/min (ref >60)
Glucose, Bld: 108 mg/dL — ABNORMAL HIGH (ref 70–99)
Potassium: 3.8 mmol/L (ref 3.5–5.1)
Sodium: 136 mmol/L (ref 135–145)

## 2023-12-21 LAB — HEPATIC FUNCTION PANEL
ALT: 112 U/L — ABNORMAL HIGH (ref 0–44)
AST: 243 U/L — ABNORMAL HIGH (ref 15–41)
Albumin: 2.4 g/dL — ABNORMAL LOW (ref 3.5–5.0)
Alkaline Phosphatase: 168 U/L — ABNORMAL HIGH (ref 38–126)
Bilirubin, Direct: 5.1 mg/dL — ABNORMAL HIGH (ref 0.0–0.2)
Indirect Bilirubin: 3.8 mg/dL — ABNORMAL HIGH (ref 0.3–0.9)
Total Bilirubin: 8.9 mg/dL — ABNORMAL HIGH (ref 0.0–1.2)
Total Protein: 5.8 g/dL — ABNORMAL LOW (ref 6.5–8.1)

## 2023-12-21 LAB — PROTIME-INR
INR: 1.4 — ABNORMAL HIGH (ref 0.8–1.2)
Prothrombin Time: 18 s — ABNORMAL HIGH (ref 11.4–15.2)

## 2023-12-21 MED ORDER — TAMSULOSIN HCL 0.4 MG PO CAPS
0.8000 mg | ORAL_CAPSULE | Freq: Every day | ORAL | Status: DC
Start: 2023-12-21 — End: 2023-12-22
  Administered 2023-12-21 – 2023-12-22 (×2): 0.8 mg via ORAL
  Filled 2023-12-21 (×2): qty 2

## 2023-12-21 MED ORDER — COLLAGENASE 250 UNIT/GM EX OINT
TOPICAL_OINTMENT | Freq: Every day | CUTANEOUS | Status: DC
  Filled 2023-12-21: qty 30

## 2023-12-21 MED ORDER — THYROID 60 MG PO TABS
60.0000 mg | ORAL_TABLET | Freq: Every day | ORAL | Status: DC
  Administered 2023-12-21 – 2023-12-22 (×2): 60 mg via ORAL
  Filled 2023-12-21 (×2): qty 1

## 2023-12-21 NOTE — Consult Note (Signed)
 Inpatient Consultation   Patient ID: Tony Luna is a 83 y.o. male.  Requesting Provider: Anthony Pouch, MD  Date of Admission: 12/20/2023  Date of Consult: 12/21/23   Reason for Consultation: Cirrhosis   Patient's Chief Complaint:   Chief Complaint  Patient presents with   Shortness of Breath    83 year old Caucasian male with history of alcohol abuse, compensated cirrhosis, hypothyroidism, sleep apnea presenting to the hospital with acutely worsening respiratory status and found to have bilateral pneumonia with respiratory failure.  GI is consulted at family request for cirrhosis.  Patient has had longstanding cirrhosis secondary to alcohol since at least 2018.  He is loosely followed with GI through this time, but more for colon polyps.  He is not on any active treatment such as diuretics or beta-blockade.  Daughter and wife are at bedside who help provide history.  Patient seems to downplay his alcohol intake where daughter reports he drinks more than he alludes to.  Typically drinks wine or liquor.  No episodes of nausea, vomiting, abdominal pain, abdominal distention, or leg swelling.  No hematemesis or coffee-ground emesis.  He denies hematochezia.  He reports greenish bowel movements over the last week.  He denies any Pepto-Bismol iron or Kaopectate use.  He is jaundiced, but denies pruritus.  Diffuse ecchymosis including on his face. He reports overall his bowel movements are formed, but he occasionally has loose and urgent bowel movements as well.  This seems to be more dietary related as he is cut back now on his dairy and salads with improvement.  Patient was initially sent to the hospital by his PCP for liver failure.  Admitted for aspiration pneumonia.  New oxygen requirement.  Therapy was with him prior to my evaluation with his oxygen saturation decreasing with movement.  While he declined shortness of breath, he is tachypneic upon sitting down from his movement  while wearing supplemental O2.  Known sacral decubitus  400mg  a day for a couple weeks more recently  Denies Anti-plt agents, and anticoagulants Denies family history of gastrointestinal disease and malignancy Previous Endoscopies: Last colonoscopy: 03/2022 - adenomatous polyp, diverticulosis, IH    Past Medical History:  Diagnosis Date   Allergy    Arthritis    Cirrhosis (HCC)    Complication of anesthesia    :need catheter   Dysrhythmia    ? something told by reg dr  unable to say what it was   Esophageal tear    Hypothyroidism    Pneumonia    hx   Primary osteoarthritis of left knee 06/22/2014   Rotator cuff tear    Sleep apnea corrected- dental device    Past Surgical History:  Procedure Laterality Date   BACK SURGERY     blepharaplasty     CATARACT EXTRACTION W/PHACO Left 02/21/2021   Procedure: CATARACT EXTRACTION PHACO AND INTRAOCULAR LENS PLACEMENT (IOC) LEFT 4.56 00:34.9;  Surgeon: Jaye Fallow, MD;  Location: [facility];  Service: Ophthalmology;  Laterality: Left;   CAUTERY OF TURBINATES     COLONOSCOPY WITH PROPOFOL  N/A 07/19/2015   Procedure: COLONOSCOPY WITH PROPOFOL ;  Surgeon: Rogelia Copping, MD;  Location: [facility];  Service: Endoscopy;  Laterality: N/A;   COLONOSCOPY WITH PROPOFOL  N/A 03/21/2021   Procedure: COLONOSCOPY WITH PROPOFOL ;  Surgeon: Copping Rogelia, MD;  Location: [facility];  Service: Endoscopy;  Laterality: N/A;   COLONOSCOPY WITH PROPOFOL  N/A 04/18/2021   Procedure: COLONOSCOPY WITH PROPOFOL ;  Surgeon: Copping Rogelia, MD;  Location: [facility];  Service: Endoscopy;  Laterality: N/A;   COLONOSCOPY WITH PROPOFOL  N/A 04/24/2022   Procedure: COLONOSCOPY WITH PROPOFOL ;  Surgeon: Jinny Carmine, MD;  Location: [facility];  Service: Endoscopy;  Laterality: N/A;   EYE SURGERY     HERNIA REPAIR Left 20+ years ago   inguinal herniorrhapy   JOINT REPLACEMENT  left knee 2016   KNEE SURGERY     ? loose body/ chondral defect  70yrs   ROTATOR CUFF REPAIR     left   SPINE SURGERY  10-12 years ago   TONSILLECTOMY     TOTAL KNEE ARTHROPLASTY Left 06/22/2014   Procedure: TOTAL KNEE ARTHROPLASTY;  Surgeon: Fonda SHAUNNA Olmsted, MD;  Location: [facility];  Service: Orthopedics;  Laterality: Left;    No Known Allergies  Family History  Problem Relation Age of Onset   Alcohol abuse Mother    Diabetes Mother    Mental retardation Mother    Arthritis Father    Hearing loss Father    Alcohol abuse Father     Social History   Tobacco Use   Smoking status: Former    Current packs/day: 0.00    Average packs/day: 1 pack/day for 15.0 years (15.0 ttl pk-yrs)    Types: Cigarettes    Start date: 06/14/1968    Quit date: 06/15/1983    Years since quitting: 40.5   Smokeless tobacco: Current    Types: Chew  Vaping Use   Vaping status: Never Used  Substance Use Topics   Alcohol use: Yes    Alcohol/week: 10.0 standard drinks of alcohol    Types: 10 Glasses of wine per week    Comment: pt reports 3 glasses per day   Drug use: No     Pertinent GI related history and allergies were reviewed with the patient  Review of Systems  Constitutional:  Negative for activity change, appetite change, chills, diaphoresis, fatigue, fever and unexpected weight change.  HENT:  Negative for trouble swallowing and voice change.   Respiratory:  Positive for shortness of breath. Negative for wheezing.   Cardiovascular:  Negative for chest pain, palpitations and leg swelling.  Gastrointestinal:  Positive for diarrhea. Negative for abdominal distention, abdominal pain, anal bleeding, blood in stool, constipation, nausea, rectal pain and vomiting.  Musculoskeletal:  Negative for arthralgias and myalgias.  Skin:  Positive for color change. Negative for pallor.  Neurological:  Negative for dizziness, syncope and weakness.  Hematological:  Bruises/bleeds easily.  Psychiatric/Behavioral:  Negative for confusion. The patient is not nervous/anxious.    All other systems reviewed and are negative.    Medications Home Medications No current facility-administered medications on file prior to encounter.   Current Outpatient Medications on File Prior to Encounter  Medication Sig Dispense Refill   Boron 6 MG TABS Take by mouth daily.     clotrimazole-betamethasone (LOTRISONE) cream Apply topically.     loperamide  (IMODIUM ) 2 MG capsule Take 1 capsule (2 mg total) by mouth as needed for diarrhea or loose stools. 30 capsule 0   mirtazapine  (REMERON ) 7.5 MG tablet Take 1 tablet (7.5 mg total) by mouth at bedtime. 90 tablet 1   Multiple Vitamin (MULTIVITAMIN WITH MINERALS) TABS tablet Take 1 tablet by mouth 2 (two) times daily.     mupirocin ointment (BACTROBAN) 2 % SMARTSIG:1 Application Topical 2-3 Times Daily     OVER THE COUNTER MEDICATION Take 1 capsule by mouth 3 (three) times daily. Instaflex     OVER THE COUNTER MEDICATION Take 250 mg by mouth  daily. Bio Magnesium  Citrate     OVER THE COUNTER MEDICATION Take 20,000 mcg by mouth daily. Vitamin B12-methylfolate     OVER THE COUNTER MEDICATION Take 800 mcg by mouth daily. Active B Folate     OVER THE COUNTER MEDICATION 2 tablets daily. VitaPrime     OVER THE COUNTER MEDICATION 1,000 mg daily. Super Bio-C buffered     OVER THE COUNTER MEDICATION 1,000 mg daily. L'Arginine     Saw Palmetto 160 MG TABS daily in the afternoon.     sulfamethoxazole -trimethoprim  (BACTRIM  DS) 800-160 MG tablet Take 1 tablet by mouth 2 (two) times daily. 20 tablet 0   tadalafil  (CIALIS ) 20 MG tablet TAKE ONE TABLET DAILY AS NEEDED FOR ERECTILE DYSFUNCTION 90 tablet 3   tamsulosin  (FLOMAX ) 0.4 MG CAPS capsule Take 2 capsules (0.8 mg total) by mouth daily. 90 capsule 1   Testosterone  75 MG PLLT by Implant route.     thyroid  (NP THYROID ) 60 MG tablet Take 1 tablet (60 mg total) by mouth daily. 90 tablet 1   traMADol  (ULTRAM ) 50 MG tablet TAKE ONE TABLET EVERY 6 HOURS AS NEEDED FOR PAIN 28 tablet 0   Zinc  Oxide 10 %  OINT Apply twice daily to sacral decubitus ulcer 85 g 2   Pertinent GI related medications were reviewed with the patient  Inpatient Medications  Current Facility-Administered Medications:    acetaminophen  (TYLENOL ) tablet 650 mg, 650 mg, Oral, Q6H PRN **OR** acetaminophen  (TYLENOL ) suppository 650 mg, 650 mg, Rectal, Q6H PRN, Cox, Amy N, DO   azithromycin  (ZITHROMAX ) 500 mg in sodium chloride  0.9 % 250 mL IVPB, 500 mg, Intravenous, Q24H, Cox, Amy N, DO   cefTRIAXone  (ROCEPHIN ) 2 g in sodium chloride  0.9 % 100 mL IVPB, 2 g, Intravenous, Q24H, Cox, Amy N, DO   folic acid  (FOLVITE ) tablet 1 mg, 1 mg, Oral, Daily, Cox, Amy N, DO, 1 mg at 12/20/23 1745   heparin  injection 5,000 Units, 5,000 Units, Subcutaneous, Q8H, Cox, Amy N, DO, 5,000 Units at 12/21/23 0522   [DISCONTINUED] LORazepam  (ATIVAN ) tablet 1-4 mg, 1-4 mg, Oral, Q1H PRN **OR** LORazepam  (ATIVAN ) injection 1-4 mg, 1-4 mg, Intravenous, Q1H PRN, Cox, Amy N, DO   multivitamin with minerals tablet 1 tablet, 1 tablet, Oral, Daily, Cox, Amy N, DO, 1 tablet at 12/20/23 1745   ondansetron  (ZOFRAN ) tablet 4 mg, 4 mg, Oral, Q6H PRN **OR** ondansetron  (ZOFRAN ) injection 4 mg, 4 mg, Intravenous, Q6H PRN, Cox, Amy N, DO   senna-docusate (Senokot-S) tablet 1 tablet, 1 tablet, Oral, QHS PRN, Cox, Amy N, DO   thiamine  (VITAMIN B1) tablet 100 mg, 100 mg, Oral, Daily, 100 mg at 12/20/23 1745 **OR** thiamine  (VITAMIN B1) injection 100 mg, 100 mg, Intravenous, Daily, Cox, Amy N, DO  azithromycin      cefTRIAXone  (ROCEPHIN )  IV      acetaminophen  **OR** acetaminophen , [DISCONTINUED] LORazepam  **OR** LORazepam , ondansetron  **OR** ondansetron  (ZOFRAN ) IV, senna-docusate   Objective   Vitals:   12/20/23 1952 12/21/23 0020 12/21/23 0445 12/21/23 0748  BP: 115/60 (!) 141/86 128/67 115/63  Pulse: 100 (!) 108 94 91  Resp: 18 18 20 18   Temp: (!) 97.4 F (36.3 C) 97.9 F (36.6 C) 97.9 F (36.6 C) 98.3 F (36.8 C)  TempSrc:   Oral Oral  SpO2: 93% 92% 90%  93%  Weight:      Height:         Physical Exam Vitals and nursing note reviewed.  Constitutional:      General: He  is not in acute distress.    Appearance: Normal appearance. He is ill-appearing. He is not toxic-appearing or diaphoretic.  HENT:     Head: Normocephalic and atraumatic.     Nose: Nose normal.     Mouth/Throat:     Mouth: Mucous membranes are moist.     Pharynx: Oropharynx is clear.     Comments: Sublingual jaundice Eyes:     General: Scleral icterus present.     Extraocular Movements: Extraocular movements intact.  Cardiovascular:     Rate and Rhythm: Regular rhythm. Tachycardia present.     Heart sounds: Murmur heard.  Pulmonary:     Effort: No respiratory distress.     Breath sounds: No wheezing, rhonchi or rales.     Comments: Wearing supplemental O2.  Bilaterally diminished lung sounds Abdominal:     General: Bowel sounds are normal. There is no distension.     Palpations: Abdomen is soft.     Tenderness: There is no abdominal tenderness. There is no guarding or rebound.  Musculoskeletal:     Cervical back: Neck supple.     Right lower leg: No edema.     Left lower leg: No edema.  Skin:    General: Skin is warm and dry.     Coloration: Skin is jaundiced. Skin is not pale.     Findings: Bruising present.     Comments: Diffuse ecchymosis including on the face  Neurological:     General: No focal deficit present.     Mental Status: He is alert and oriented to person, place, and time. Mental status is at baseline.     Comments: No asterixis  Psychiatric:        Mood and Affect: Mood normal.        Behavior: Behavior normal.        Thought Content: Thought content normal.        Judgment: Judgment normal.     Laboratory Data Recent Labs  Lab 12/20/23 1054 12/20/23 1232 12/21/23 0429  WBC 6.1 8.1 4.7  HGB 11.2* 11.6* 9.1*  HCT 32.3* 33.8* 26.6*  PLT 134.0* 149* 102*   Recent Labs  Lab 12/20/23 1054 12/20/23 1232 12/21/23 0429  NA 136  137 136  K 4.0 4.4 3.8  CL 100 101 103  CO2 22 19* 23  BUN 23 24* 25*  CALCIUM 8.5 8.7* 8.6*  PROT 5.7* 6.1*  --   BILITOT 10.3* 11.1*  --   ALKPHOS 202* 184*  --   ALT 121* 132*  --   AST 266* 318*  --   GLUCOSE 110* 115* 108*   Recent Labs  Lab 12/20/23 1312  INR 1.3*    Recent Labs    12/20/23 1232  LIPASE 45        Imaging Studies: CT Chest Wo Contrast Result Date: 12/20/2023 CLINICAL DATA:  Respiratory illness. EXAM: CT CHEST WITHOUT CONTRAST TECHNIQUE: Multidetector CT imaging of the chest was performed following the standard protocol without IV contrast. RADIATION DOSE REDUCTION: This exam was performed according to the departmental dose-optimization program which includes automated exposure control, adjustment of the mA and/or kV according to patient size and/or use of iterative reconstruction technique. COMPARISON:  Chest radiograph dated 12/20/2023. FINDINGS: Evaluation of this exam is limited in the absence of intravenous contrast. Cardiovascular: There is no cardiomegaly or pericardial effusion. There is 3 vessel coronary vascular calcification. Mild atherosclerotic calcification of the thoracic aorta. No evidence of bowel dilatation. The central pulmonary arteries  are grossly unremarkable. Mediastinum/Nodes: No hilar or mediastinal adenopathy. The esophagus is grossly unremarkable. No mediastinal fluid collection. Lungs/Pleura: Mild bilateral lower lobe bronchiectasis. Diffuse ground-glass density in the lower lobes and lower lung fields as well as interstitial and subpleural densities likely represent pneumonia. Underlying interstitial lung disease is not excluded but favored less likely. Biapical calcified pleural plaques. Trace left pleural effusion. No pneumothorax. Upper Abdomen: Fatty liver and cirrhosis. A 3 mm nonobstructing left renal stone. Musculoskeletal: Degenerative changes of the spine. No acute osseous pathology. IMPRESSION: 1. Bilateral lower lobe and lower  lung field predominant densities likely represent pneumonia. Clinical correlation and follow-up recommended. 2. Fatty liver and cirrhosis. 3. A 3 mm nonobstructing left renal stone. 4.  Aortic Atherosclerosis (ICD10-I70.0). Electronically Signed   By: Vanetta Chou M.D.   On: 12/20/2023 14:20   DG Chest 2 View Result Date: 12/20/2023 CLINICAL DATA:  Shortness of breath, liver failure EXAM: CHEST - 2 VIEW COMPARISON:  12/13/2023 FINDINGS: Lower thoracic spondylosis. Atherosclerotic calcification of the aortic arch. Biapical pleuroparenchymal scarring with associated calcifications. Reticular interstitial accentuation especially posteriorly in the lower lobes, although judged to be mildly improved from 12/13/2023. IMPRESSION: 1. Reticular interstitial accentuation especially posteriorly in the lower lobes, although judged to be mildly improved from 12/13/2023. This could reflect resolving noncardiogenic edema or resolving infection. 2. Lower thoracic spondylosis. 3. Aortic Atherosclerosis (ICD10-I70.0). Electronically Signed   By: Ryan Salvage M.D.   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  No active signs of GI bleeding  #Lactic acidosis - Secondary to pneumonia, difficulty clearing due to liver disease  #Sleep apnea  #Decubitus ulcer  #History of diverticulosis  #Personal history of colon polyps  # h/o internal hemorrhoids  Plan:  Reviewed previous GI notes, PCP notes,  endoscopy records and imaging as well as labs Patient will need continued outpatient follow-up for his cirrhosis Strongly emphasized multiple times alcohol cessation  Discussed nutrition such as increased protein intake (greater than 2 g/day) and decrease sodium intake less than 2 g/day). Can consider inpatient nutrition consult  Avoid aspirin and NSAIDs (ibuprofen, Motrin, Advil, naproxen, Aleve, Naprosyn, Goody's powder, & BC powder). Okay for up to 2 g of Tylenol  a day  No current signs of liver decompensation or liver failure -INR less than 1.5, no encephalopathy, and currently carries chronic liver disease diagnosis Viral hepatitis and Tylenol  negative  Patient does not qualify for steroids in the setting of alcoholic hepatitis.  MDF less than 32 and dealing with current pneumonia infection  No liver lesion on imaging.  No signs of biliary obstruction Expect his total bilirubin to wax and wane.  It may take several months for this to normalize assuming complete sobriety  Antibiotics as per primary team  Recommend discontinuation of otc supplements as these may contribute to DILI  With his acute hypoxic respiratory failure he is not a sedation candidate at this time.  Would allow for improvement prior to outpatient upper endoscopy.  CIWA protocol  Extensive counseling provided to the family and patient. Questions  answered with verbalized understanding Continue planned outpatient workup/care.  Will eventually need upper endoscopy as outpatient  No further inpatient workup indicated at this time  Management of other medical comorbidities as per primary team  GI to sign off. Available as needed with callback. Please do not hesitate to call regarding questions or concerns.  I personally performed the service.  Thank you for allowing us  to participate in this patient's care.   Elspeth Ozell Jungling, DO [facility]  Portions of the record may have been  created with voice recognition software. Occasional wrong-word or 'sound-a-like' substitutions may have occurred due to the inherent limitations of voice recognition software.  Read the chart carefully and recognize, using context, where substitutions may have occurred.

## 2023-12-21 NOTE — Plan of Care (Signed)

## 2023-12-21 NOTE — Evaluation (Signed)
 Physical Therapy Evaluation Patient Details Name: Tony Luna MRN: [id number] DOB: 1940-06-24 Today's Date: 12/21/2023  History of Present Illness  Pt admitted for ARF with hypoxia with complaints of SOB symptoms. History includes liver failure  Clinical Impression  Pt is a pleasant 83 year old male who was admitted for ARF with hypoxia. Pt currently on acute O2 at this time. Pt performs bed mobility with supervision, transfers with CGA, and ambulation with CGA and no AD. Pt demonstrates deficits with strength/mobility/balance/endurance. All mobility performed on 2L with quick desat with exertion. RN notified. Would benefit from skilled PT to address above deficits and promote optimal return to PLOF. Pt will continue to receive skilled PT services while admitted and will defer to TOC/care team for updates regarding disposition planning.         If plan is discharge home, recommend the following: A little help with walking and/or transfers;Help with stairs or ramp for entrance   Can travel by private vehicle        Equipment Recommendations None recommended by PT  Recommendations for Other Services       Functional Status Assessment Patient has had a recent decline in their functional status and demonstrates the ability to make significant improvements in function in a reasonable and predictable amount of time.     Precautions / Restrictions Precautions Precautions: Fall Recall of Precautions/Restrictions: Intact Restrictions Weight Bearing Restrictions Per Provider Order: No      Mobility  Bed Mobility Overal bed mobility: Needs Assistance Bed Mobility: Supine to Sit     Supine to sit: Supervision     General bed mobility comments: takes extended time, once seated at EOB, upright posture noted. O2 sats at 88% on 2L once seated.    Transfers Overall transfer level: Needs assistance Equipment used: None Transfers: Sit to/from Stand Sit to Stand: Contact guard  assist           General transfer comment: takes extended time, once standing, upright posture.    Ambulation/Gait Ambulation/Gait assistance: Contact guard assist Gait Distance (Feet): 100 Feet Assistive device: None Gait Pattern/deviations: Step-through pattern       General Gait Details: ambulated multiple laps in room due to O2 needs. Slight dyspnea with exertion. O2 sats desat to 82% on 2L taking extended time for recovery.  Stairs            Wheelchair Mobility     Tilt Bed    Modified Rankin (Stroke Patients Only)       Balance Overall balance assessment: Needs assistance, History of Falls Sitting-balance support: No upper extremity supported, Feet supported Sitting balance-Leahy Scale: Good     Standing balance support: No upper extremity supported Standing balance-Leahy Scale: Fair                               Pertinent Vitals/Pain Pain Assessment Pain Assessment: No/denies pain    Home Living Family/patient expects to be discharged to:: Private residence Living Arrangements: Spouse/significant other Available Help at Discharge: Family Type of Home: House Home Access: Stairs to enter Entrance Stairs-Rails: None Entrance Stairs-Number of Steps: 3 Alternate Level Stairs-Number of Steps: flight Home Layout: Two level;Able to live on main level with bedroom/bathroom Home Equipment: None      Prior Function Prior Level of Function : Independent/Modified Independent;Driving;History of Falls (last six months)             Mobility Comments: indep, reports  history of recent 1-2 falls ADLs Comments: indep     Extremity/Trunk Assessment   Upper Extremity Assessment Upper Extremity Assessment: Overall WFL for tasks assessed    Lower Extremity Assessment Lower Extremity Assessment: Generalized weakness (B LE grossly 4/5)       Communication   Communication Communication: No apparent difficulties    Cognition Arousal:  Alert Behavior During Therapy: WFL for tasks assessed/performed   PT - Cognitive impairments: No apparent impairments                         Following commands: Intact       Cueing Cueing Techniques: Verbal cues     General Comments      Exercises     Assessment/Plan    PT Assessment Patient needs continued PT services  PT Problem List Decreased strength;Decreased activity tolerance;Decreased balance;Decreased mobility;Cardiopulmonary status limiting activity       PT Treatment Interventions DME instruction;Gait training;Therapeutic exercise;Balance training    PT Goals (Current goals can be found in the Care Plan section)  Acute Rehab PT Goals Patient Stated Goal: to go home PT Goal Formulation: With patient Time For Goal Achievement: 01/04/24 Potential to Achieve Goals: Good    Frequency Min 1X/week     Co-evaluation               AM-PAC PT 6 Clicks Mobility  Outcome Measure Help needed turning from your back to your side while in a flat bed without using bedrails?: None Help needed moving from lying on your back to sitting on the side of a flat bed without using bedrails?: None Help needed moving to and from a bed to a chair (including a wheelchair)?: None Help needed standing up from a chair using your arms (e.g., wheelchair or bedside chair)?: None Help needed to walk in hospital room?: A Little Help needed climbing 3-5 steps with a railing? : A Little 6 Click Score: 22    End of Session Equipment Utilized During Treatment: Oxygen Activity Tolerance: Patient tolerated treatment well Patient left: in bed (seated at EOB with family present, no recliner available) Nurse Communication: Mobility status PT Visit Diagnosis: Muscle weakness (generalized) (M62.81);Difficulty in walking, not elsewhere classified (R26.2);Unsteadiness on feet (R26.81)    Time: 9177-9158 PT Time Calculation (min) (ACUTE ONLY): 19 min   Charges:   PT  Evaluation $PT Eval Low Complexity: 1 Low   PT General Charges $$ ACUTE PT VISIT: 1 Visit         [name], PT, DPT, GCS [phone]   Tane Biegler 12/21/2023, 9:53 AM

## 2023-12-21 NOTE — Plan of Care (Signed)
 m

## 2023-12-21 NOTE — Progress Notes (Signed)
 PROGRESS NOTE    TOWNES FUHS  FMW:[id number] DOB: 1941-04-12 DOA: 12/20/2023 PCP: Marylynn Verneita CROME, MD   Assessment & Plan:   Principal Problem:   Acute hypoxic respiratory failure (HCC) Active Problems:   CAP (community acquired pneumonia)   LFT elevation   SIRS due to infectious process with acute organ dysfunction (HCC)   Cirrhosis (HCC)   Elevated troponin   ED (erectile dysfunction) of organic origin   White coat syndrome with high blood pressure without hypertension   Ecchymosis of eye, initial encounter   Ecchymosis on examination   Traumatic ecchymosis   Traumatic ecchymosis of cheek   Benign prostatic hyperplasia with urinary obstruction   Alcohol use disorder   Lactic acid blood increased   Pressure injury of skin  Assessment and Plan: Acute hypoxic respiratory failure: found to be 87% on RA. Likely secondary to CAP. Continue on IV abxs. Continue on supplemental oxygen and wean as tolerated   Sepsis: met criteria w/ tachycardia, tachypnea, elevated lactic acid & secondary to pneumonia. Continue on IV rocephin , azithromycin . Blood cxs NGTD    Transaminitis: likely secondary to chronic alcohol abuse. Will continue to monitor    CAP: continue on IV rocephin , azithromycin , bronchodilators & encourage incentive spirometry   Elevated troponin: likely secondary to demand ischemia.   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 Not taking lasix, aldactone, lactulose as per pt's med rec. Pt's daughter requested inpatient GI consult so Dr. Onita was consulted. Has outpatient GI appt in Oct 2025.   Transaminitis: likely secondary to alcohol abuse    Traumatic ecchymosis of the face: present on admission. Patient fell trying to open the garage door about 1 to 2 weeks ago  Alcohol use disorder: received cessation counseling already. 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 No need for a transfusion currently    Thrombocytopenia:  secondary to cirrhosis. No need for a transfusion currently    DVT prophylaxis: heparin   Code Status: DNR Family Communication: discussed pt's care w/ pt's family at bedside and answered her questions Disposition Plan: d/c back home  Level of care: Telemetry Medical  Status is: Inpatient Remains inpatient appropriate because: severity of illness    Consultants:  GI, requested by pt's daughter   Procedures:   Antimicrobials:    Subjective: Pt c/o shortness of breath   Objective: Vitals:   12/20/23 1952 12/21/23 0020 12/21/23 0445 12/21/23 0748  BP: 115/60 (!) 141/86 128/67 115/63  Pulse: 100 (!) 108 94 91  Resp: 18 18 20 18   Temp: (!) 97.4 F (36.3 C) 97.9 F (36.6 C) 97.9 F (36.6 C) 98.3 F (36.8 C)  TempSrc:   Oral Oral  SpO2: 93% 92% 90% 93%  Weight:      Height:        Intake/Output Summary (Last 24 hours) at 12/21/2023 0827 Last data filed at 12/20/2023 1841 Gross per 24 hour  Intake 350 ml  Output --  Net 350 ml   Filed Weights   12/20/23 1258 12/20/23 1800  Weight: 70 kg 70.3 kg    Examination:  General exam: Appears calm and comfortable. Scleral icterus  Respiratory system: decreased breath sounds b/l  Cardiovascular system: S1 & S2 +. No rubs, gallops or clicks.  Gastrointestinal system: Abdomen is nondistended, soft and nontender. Normal bowel sounds heard. Central nervous system: Alert and oriented. Moves all extremities  Psychiatry: Judgement and insight appear normal. Mood & affect appropriate.     Data Reviewed: I have  personally reviewed following labs and imaging studies  CBC: Recent Labs  Lab 12/20/23 1054 12/20/23 1232 12/21/23 0429  WBC 6.1 8.1 4.7  NEUTROABS 5.1  --   --   HGB 11.2* 11.6* 9.1*  HCT 32.3* 33.8* 26.6*  MCV 107.1* 106.6* 105.6*  PLT 134.0* 149* 102*   Basic Metabolic Panel: Recent Labs  Lab 12/20/23 1054 12/20/23 1232 12/21/23 0429  NA 136 137 136  K 4.0 4.4 3.8  CL 100 101 103  CO2 22 19* 23   GLUCOSE 110* 115* 108*  BUN 23 24* 25*  CREATININE 0.87 0.72 0.66  CALCIUM 8.5 8.7* 8.6*   GFR: Estimated Creatinine Clearance: 72 mL/min (by C-G formula based on SCr of 0.66 mg/dL). Liver Function Tests: Recent Labs  Lab 12/20/23 1054 12/20/23 1232  AST 266* 318*  ALT 121* 132*  ALKPHOS 202* 184*  BILITOT 10.3* 11.1*  PROT 5.7* 6.1*  ALBUMIN 3.1* 2.7*   Recent Labs  Lab 12/20/23 1232  LIPASE 45   No results for input(s): AMMONIA in the last 168 hours. Coagulation Profile: Recent Labs  Lab 12/20/23 1312  INR 1.3*   Cardiac Enzymes: No results for input(s): CKTOTAL, CKMB, CKMBINDEX, TROPONINI in the last 168 hours. BNP (last 3 results) No results for input(s): PROBNP in the last 8760 hours. HbA1C: No results for input(s): HGBA1C in the last 72 hours. CBG: No results for input(s): GLUCAP in the last 168 hours. Lipid Profile: No results for input(s): CHOL, HDL, LDLCALC, TRIG, CHOLHDL, LDLDIRECT in the last 72 hours. Thyroid  Function Tests: No results for input(s): TSH, T4TOTAL, FREET4, T3FREE, THYROIDAB in the last 72 hours. Anemia Panel: No results for input(s): VITAMINB12, FOLATE, FERRITIN, TIBC, IRON, RETICCTPCT in the last 72 hours. Sepsis Labs: Recent Labs  Lab 12/20/23 1501 12/20/23 1724 12/20/23 2003  LATICACIDVEN 2.0* 2.4* 2.9*    Recent Results (from the past 240 hours)  SARS Coronavirus 2 by RT PCR (hospital order, performed in [facility] hospital lab) *cepheid single result test* Anterior Nasal Swab     Status: None   Collection Time: 12/20/23  1:45 PM   Specimen: Anterior Nasal Swab  Result Value Ref Range Status   SARS Coronavirus 2 by RT PCR NEGATIVE NEGATIVE Final    Comment: (NOTE) SARS-CoV-2 target nucleic acids are NOT DETECTED.  The SARS-CoV-2 RNA is generally detectable in upper and lower respiratory specimens during the acute phase of infection. The lowest concentration of  SARS-CoV-2 viral copies this assay can detect is 250 copies / mL. A negative result does not preclude SARS-CoV-2 infection and should not be used as the sole basis for treatment or other patient management decisions.  A negative result may occur with improper specimen collection / handling, submission of specimen other than nasopharyngeal swab, presence of viral mutation(s) within the areas targeted by this assay, and inadequate number of viral copies (<250 copies / mL). A negative result must be combined with clinical observations, patient history, and epidemiological information.  Fact Sheet for Patients:   RoadLapTop.co.za  Fact Sheet for Healthcare Providers: http://kim-miller.com/  This test is not yet approved or  cleared by the [place]  FDA and has been authorized for detection and/or diagnosis of SARS-CoV-2 by FDA under an Emergency Use Authorization (EUA).  This EUA will remain in effect (meaning this test can be used) for the duration of the COVID-19 declaration under Section 564(b)(1) of the Act, 21 U.S.C. section 360bbb-3(b)(1), unless the authorization is terminated or revoked sooner.  Performed at  [facility], [address] [place], [place] [address]   Blood Culture (routine x 2)     Status: None (Preliminary result)   Collection Time: 12/20/23  3:01 PM   Specimen: BLOOD  Result Value Ref Range Status   Specimen Description BLOOD BLOOD LEFT ARM  Final   Special Requests   Final    BOTTLES DRAWN AEROBIC AND ANAEROBIC Blood Culture adequate volume   Culture   Final    NO GROWTH < 24 HOURS Performed at [facility], [address], [place], [place] [address]    Report Status PENDING  Incomplete  Blood Culture (routine x 2)     Status: None (Preliminary result)   Collection Time: 12/20/23  3:01 PM   Specimen: BLOOD  Result Value Ref Range Status   Specimen Description BLOOD BLOOD RIGHT ARM   Final   Special Requests   Final    BOTTLES DRAWN AEROBIC AND ANAEROBIC Blood Culture adequate volume   Culture   Final    NO GROWTH < 24 HOURS Performed at [facility], [address], [place], [place] [address]    Report Status PENDING  Incomplete  MRSA Next Gen by PCR, Nasal     Status: None   Collection Time: 12/20/23  9:48 PM   Specimen: Nasal Mucosa; Nasal Swab  Result Value Ref Range Status   MRSA by PCR Next Gen NOT DETECTED NOT DETECTED Final    Comment: (NOTE) The GeneXpert MRSA Assay (FDA approved for NASAL specimens only), is one component of a comprehensive MRSA colonization surveillance program. It is not intended to diagnose MRSA infection nor to guide or monitor treatment for MRSA infections. Test performance is not FDA approved in patients less than 79 years old. Performed at [facility], [address] [place], [place] [address]          Radiology Studies: CT Chest Wo Contrast Result Date: 12/20/2023 CLINICAL DATA:  Respiratory illness. EXAM: CT CHEST WITHOUT CONTRAST TECHNIQUE: Multidetector CT imaging of the chest was performed following the standard protocol without IV contrast. RADIATION DOSE REDUCTION: This exam was performed according to the departmental dose-optimization program which includes automated exposure control, adjustment of the mA and/or kV according to patient size and/or use of iterative reconstruction technique. COMPARISON:  Chest radiograph dated 12/20/2023. FINDINGS: Evaluation of this exam is limited in the absence of intravenous contrast. Cardiovascular: There is no cardiomegaly or pericardial effusion. There is 3 vessel coronary vascular calcification. Mild atherosclerotic calcification of the thoracic aorta. No evidence of bowel dilatation. The central pulmonary arteries are grossly unremarkable. Mediastinum/Nodes: No hilar or mediastinal adenopathy. The esophagus is grossly unremarkable. No mediastinal fluid  collection. Lungs/Pleura: Mild bilateral lower lobe bronchiectasis. Diffuse ground-glass density in the lower lobes and lower lung fields as well as interstitial and subpleural densities likely represent pneumonia. Underlying interstitial lung disease is not excluded but favored less likely. Biapical calcified pleural plaques. Trace left pleural effusion. No pneumothorax. Upper Abdomen: Fatty liver and cirrhosis. A 3 mm nonobstructing left renal stone. Musculoskeletal: Degenerative changes of the spine. No acute osseous pathology. IMPRESSION: 1. Bilateral lower lobe and lower lung field predominant densities likely represent pneumonia. Clinical correlation and follow-up recommended. 2. Fatty liver and cirrhosis. 3. A 3 mm nonobstructing left renal stone. 4.  Aortic Atherosclerosis (ICD10-I70.0). Electronically Signed   By: Vanetta Chou M.D.   On: 12/20/2023 14:20   DG Chest 2 View Result Date: 12/20/2023 CLINICAL DATA:  Shortness of breath, liver  failure EXAM: CHEST - 2 VIEW COMPARISON:  12/13/2023 FINDINGS: Lower thoracic spondylosis. Atherosclerotic calcification of the aortic arch. Biapical pleuroparenchymal scarring with associated calcifications. Reticular interstitial accentuation especially posteriorly in the lower lobes, although judged to be mildly improved from 12/13/2023. IMPRESSION: 1. Reticular interstitial accentuation especially posteriorly in the lower lobes, although judged to be mildly improved from 12/13/2023. This could reflect resolving noncardiogenic edema or resolving infection. 2. Lower thoracic spondylosis. 3. Aortic Atherosclerosis (ICD10-I70.0). Electronically Signed   By: Ryan Salvage M.D.   On: 12/20/2023 13:15        Scheduled Meds:  folic acid   1 mg Oral Daily   heparin   5,000 Units Subcutaneous Q8H   multivitamin with minerals  1 tablet Oral Daily   thiamine   100 mg Oral Daily   Or   thiamine   100 mg Intravenous Daily   Continuous Infusions:  azithromycin       cefTRIAXone  (ROCEPHIN )  IV       LOS: 1 day      Anthony CHRISTELLA Pouch, MD [facility] Pager [phone]  If 7PM-7AM, please contact night-coverage [email or web address] 12/21/2023, 8:27 AM

## 2023-12-21 NOTE — Evaluation (Signed)
 Occupational Therapy Evaluation Patient Details Name: Tony Luna MRN: [id number] DOB: [birth year] Today's Date: 12/21/2023   History of Present Illness   Pt admitted for ARF with hypoxia with complaints of SOB symptoms. History includes liver failure     Clinical Impressions  Pt is a pleasant 83 year old male who was admitted for ARF with hypoxia. Pt currently on 2L  O2 resting in bed O2 sats 92 to 93%.  Heart rate 96. SABRA Pt performs bed mobility to edge of bed independent.  Was able to perform basic ADLs at edge of bed independently with no loss of balance oxygen level staying about 90%.  Sit to stand as well as functional mobility to bathroom as well as in the room to sink contact-guard to supervision because of history of falls but also safety with negotiating oxygen tubing.  No assistive device.  Oxygen levels dropped to 84 to 86% and heart rate increased to 120.  Patient needed extended time to recover with bringing up oxygen levels and decreased heart rate.  Patient showed decreased strength as well as standing balance and functional mobility and ADLs and IADLs.  All activities performed on 2L with quick desat with exertion.  Patient can continue to benefit from skilled OT services to decrease strength, balance and activity tolerance, functional mobility in ADLs and IADLs to return to prior level of function.  Did mention to patient and family referral to pulmonary rehab in the future to discuss with physician.      If plan is discharge home, recommend the following:    Safety with oxygen tubing if patient is going to be discharged home.  Would recommend home health OT PT for safety visit     Functional Status Assessment    Patient has had a recent decline in their functional status and demonstrates the ability to make significant improvements in function in a reasonable and predictable amount of time      Equipment Recommendations    Rails for shower     Recommendations  for Other Services         Precautions/Restrictions   Precautions Precautions: Fall Recall of Precautions/Restrictions: Intact Restrictions Weight Bearing Restrictions Per Provider Order: No     Mobility Bed Mobility Overal bed mobility: Independent Bed Mobility: Supine to Sit     Supine to sit: Supervision     General bed mobility comments: Able to maintain oxygen level at 91 to 92%    Transfers Overall transfer level: Needs assistance Equipment used: None Transfers: Sit to/from Stand Sit to Stand: Contact guard assist           General transfer comment: Patient is contact-guard for safety with negotiating oxygen tube from bed to bathroom from bathroom in the room to sink.  O2 sats drops to 84 to 86%.  With mobility.      Balance Overall balance assessment: Needs assistance, History of Falls Sitting-balance support: Feet supported Sitting balance-Leahy Scale: Normal Sitting balance - Comments: Patient able to perform lower body and upper body ADLs in sitting.  No loss of balance.   Standing balance support: No upper extremity supported Standing balance-Leahy Scale: Fair Standing balance comment: Patient able to perform clothing management and toilet hygiene and standing.  With contact-guard to supervision.  Decreased safety with oxygen tubing.                           ADL either performed or assessed with clinical  judgement   ADL                                         General ADL Comments: Sitting edge of bed independent lower body and upper body dressing.  As well as bathing.  Oxygen drops to 86%.;  Toileting as well as ambulating to sink in room for washing hands and grooming decreased safety negotiating oxygen tubing.  Oxygen drops to 84% and heart rate increased to 120.  Take extended time to recover.     Vision Baseline Vision/History: 1 Wears glasses Patient Visual Report: No change from baseline       Perception          Praxis         Pertinent Vitals/Pain Pain Assessment Pain Assessment:  (discomfort  more than pain)     Extremity/Trunk Assessment Upper Extremity Assessment Upper Extremity Assessment: Overall WFL for tasks assessed   Lower Extremity Assessment Lower Extremity Assessment: Generalized weakness (B LE grossly 4/5)       Communication Communication Communication: No apparent difficulties Factors Affecting Communication:  (wear hearing aids)   Cognition Arousal: Alert Behavior During Therapy: WFL for tasks assessed/performed                                 Following commands: Intact       Cueing  General Comments   Cueing Techniques: Verbal cues      Exercises     Shoulder Instructions      Home Living Family/patient expects to be discharged to:: Private residence Living Arrangements: Spouse/significant other Available Help at Discharge: Family Type of Home: House Home Access: Stairs to enter [employer] of Steps: 3 Entrance Stairs-Rails: None Home Layout: Two level;Able to live on main level with bedroom/bathroom Alternate Level Stairs-Number of Steps: Per daughter pt lives downstairs - live at [employer] Shower/Tub: Producer, television/film/video: Standard     Home Equipment: None   Additional Comments: shower chair inside shower -but no rails per family -      Prior Functioning/Environment Prior Level of Function : Independent/Modified Independent;Driving;History of Falls (last six months)             Mobility Comments: indep, reports history of recent 1-2 falls - ADLs Comments: basic ADLS independent - take laundry to be done , have cleaning service  coming in , drive - eat out or pick up meails    OT Problem List: Decreased strength;Decreased activity tolerance;Impaired balance (sitting and/or standing);Decreased safety awareness   OT Treatment/Interventions: Self-care/ADL training;Therapeutic  exercise;Neuromuscular education;Energy conservation;Patient/family education;DME and/or AE instruction;Balance training      OT Goals(Current goals can be found in the care plan section)   Acute Rehab OT Goals Patient Stated Goal: To get better that I can take care of myself and go home. OT Goal Formulation: With patient/family Time For Goal Achievement: 01/03/24 Potential to Achieve Goals: Good   OT Frequency:  Min 1X/week    Co-evaluation              AM-PAC OT 6 Clicks Daily Activity     Outcome Measure Help from another person eating meals?: None Help from another person taking care of personal grooming?: None Help from another person toileting, which includes using toliet, bedpan, or urinal?:  A Little Help from another person bathing (including washing, rinsing, drying)?: A Little Help from another person to put on and taking off regular upper body clothing?: A Little Help from another person to put on and taking off regular lower body clothing?: A Little 6 Click Score: 20   End of Session Equipment Utilized During Treatment: Gait belt  Activity Tolerance: Patient tolerated treatment well;Other (comment) (Limited by oxygen levels that decreases to 84 to 86% on 2 L) Patient left: in bed;with family/visitor present (Physician)  OT Visit Diagnosis: Muscle weakness (generalized) (M62.81);History of falling (Z91.81)                Time: 8879-8844 OT Time Calculation (min): 35 min Charges:  OT General Charges $OT Visit: 1 Visit OT Evaluation $OT Eval Low Complexity: 1 Low   Evi Mccomb OTR/L,CLT 12/21/2023, 12:54 PM

## 2023-12-21 NOTE — Consult Note (Addendum)
 WOC Nurse Consult Note: Reason for Consult: Assess a pressure ulcer POA. Performed remotely evaluating photos and notes. Wound type: PI stage 3 on bilateral buttock. Pressure Injury POA: Yes Left buttock -  Measurement: 2 wounds round shape, total aprox. 5 cm x 2 cm. 2 cm x 2 xm x 0.1 cm each. Wound bed: 100% yellow Drainage (amount, consistency, odor) Minimum amount, serous. Periwound: intact, roller edges. Dressing procedure/placement/frequency: Cleanse with vashe [id number]. Apply Santyl  on the wound bed, cover with foam dressing. Change daily or PRN.  Note: The foam dressing can be the sacrum foam, covering the both sides with the same one.  Right buttock -  Measurement: 2 cm x 1.5 xm x 0.1 cm aprox. Wound bed: 100% yellow Drainage (amount, consistency, odor) Minimum amount, serous. Periwound: intact, roller edges. Dressing procedure/placement/frequency: Cleanse with vashe [id number]. Apply Santyl  on the wound bed, cover with foam dressing. Change daily or PRN.  WOC team will not plan to follow further. Please reconsult if further assistance is needed. Thank-you,  Lela Holm BSN, CNS, RN, [employer], WOCN  (Phone [phone])

## 2023-12-22 DIAGNOSIS — J9601 Acute respiratory failure with hypoxia: Secondary | ICD-10-CM | POA: Diagnosis not present

## 2023-12-22 LAB — CBC
HCT: 25.3 % — ABNORMAL LOW (ref 39.0–52.0)
Hemoglobin: 8.8 g/dL — ABNORMAL LOW (ref 13.0–17.0)
MCH: 36.5 pg — ABNORMAL HIGH (ref 26.0–34.0)
MCHC: 34.8 g/dL (ref 30.0–36.0)
MCV: 105 fL — ABNORMAL HIGH (ref 80.0–100.0)
Platelets: 101 K/uL — ABNORMAL LOW (ref 150–400)
RBC: 2.41 MIL/uL — ABNORMAL LOW (ref 4.22–5.81)
RDW: 13 % (ref 11.5–15.5)
WBC: 4 K/uL (ref 4.0–10.5)
nRBC: 0 % (ref 0.0–0.2)

## 2023-12-22 LAB — COMPREHENSIVE METABOLIC PANEL WITH GFR
ALT: 92 U/L — ABNORMAL HIGH (ref 0–44)
AST: 192 U/L — ABNORMAL HIGH (ref 15–41)
Albumin: 2.2 g/dL — ABNORMAL LOW (ref 3.5–5.0)
Alkaline Phosphatase: 142 U/L — ABNORMAL HIGH (ref 38–126)
Anion gap: 7 (ref 5–15)
BUN: 25 mg/dL — ABNORMAL HIGH (ref 8–23)
CO2: 22 mmol/L (ref 22–32)
Calcium: 8.1 mg/dL — ABNORMAL LOW (ref 8.9–10.3)
Chloride: 106 mmol/L (ref 98–111)
Creatinine, Ser: 0.6 mg/dL — ABNORMAL LOW (ref 0.61–1.24)
GFR, Estimated: >60 mL/min (ref >60)
Glucose, Bld: 102 mg/dL — ABNORMAL HIGH (ref 70–99)
Potassium: 3.4 mmol/L — ABNORMAL LOW (ref 3.5–5.1)
Sodium: 135 mmol/L (ref 135–145)
Total Bilirubin: 7.5 mg/dL — ABNORMAL HIGH (ref 0.0–1.2)
Total Protein: 5.2 g/dL — ABNORMAL LOW (ref 6.5–8.1)

## 2023-12-22 MED ORDER — AZITHROMYCIN 250 MG PO TABS: 250.0000 mg | ORAL_TABLET | Freq: Every day | ORAL | 0 refills | Status: DC

## 2023-12-22 MED ORDER — AMOXICILLIN-POT CLAVULANATE 875-125 MG PO TABS: 1.0000 | ORAL_TABLET | Freq: Two times a day (BID) | ORAL | 0 refills | Status: DC

## 2023-12-22 NOTE — Progress Notes (Signed)
 SATURATION QUALIFICATIONS: (This note is used to comply with regulatory documentation for home oxygen)  Patient Saturations on Room Air at Rest = 96%  Patient Saturations on Room Air while Ambulating = 76%  Patient Saturations on 2 Liters of oxygen while Ambulating = 90%  Please briefly explain why patient needs home oxygen: Took pt 5 minutes from O2 stats to come back up to above 90% after putting 2LPM back on.

## 2023-12-22 NOTE — TOC CM/SW Note (Signed)
..  Transition of Care [facility]) - Inpatient Brief Assessment   Patient Details  Name: Tony Luna MRN: [id number] Date of Birth: Aug 09, 1940  Transition of Care [facility]) CM/SW Contact:    Edsel [name], LCSW Phone Number: 12/22/2023, 1:33 PM   Clinical Narrative:  SW called pt room to follow up with pt regarding HH agency.  Pt gave phone to son and son took down sw contact information to give to sister regarding which agency pt will be using.   Transition of Care Asessment:

## 2023-12-22 NOTE — Progress Notes (Signed)
 SATURATION QUALIFICATIONS: (This note is used to comply with regulatory documentation for home oxygen)   Patient Saturations on Room Air at Rest = 96%   Patient Saturations on Room Air while Ambulating = 76%   Patient Saturations on 2 Liters of oxygen while Ambulating = 90%   Please briefly explain why patient needs home oxygen: Took pt 5 minutes from O2 stats to come back up to above 90% after putting 2LPM back on.    This is non-billable note

## 2023-12-22 NOTE — Progress Notes (Signed)
  SATURATION QUALIFICATIONS: (This note is used to comply with regulatory documentation for home oxygen)   Patient Saturations on Room Air at Rest = 96%   Patient Saturations on Room Air while Ambulating = 76%   Patient Saturations on 2 Liters of oxygen while Ambulating = 90%   Please briefly explain why patient needs home oxygen: Took pt 5 minutes from O2 stats to come back up to above 90% after putting 2LPM back on.

## 2023-12-22 NOTE — Discharge Summary (Addendum)
 Physician Discharge Summary  Tony Luna FMW:[id number] DOB: 04/13/1941 DOA: 12/20/2023  PCP: Marylynn Verneita CROME, MD  Admit date: 12/20/2023 Discharge date: 12/22/2023  Admitted From: home  Disposition:  home  Recommendations for Outpatient Follow-up:  Follow up with PCP in 1-2 weeks F/u w/ GI at previously scheduled appointment   Home Health:  Equipment/Devices: 2L [place]  Discharge Condition: stable  CODE STATUS: DNR Diet recommendation: Heart Healthy  Brief/Interim Summary: HPI was taken from Dr. Sherre: Mr. Lugene Beougher is a 83 year old male with history of fatty liver, cirrhosis, alcohol use disorder, hypothyroid, sleep apnea, dysrhythmia, history of esophageal tear, who presents ED for chief concerns of shortness of breath.   Vitals in the ED showed T of 98.1, rr 21, hr 99, blood pressure 145/70, SpO2 97% on room air.   Serum sodium is 137, potassium 4.4, chloride 101, bicarb 19, BUN of 24, serum creatinine 0.72, EGFR greater than 60, nonfasting glucose 115, WBC 8.1, hemoglobin 11.8, platelets of 149.   HS troponin is 20. COVID PCR was negative.   ED treatment: Azithromycin  500 mg IV one-time dose, ceftriaxone  2 g IV one-time dose. ---------------------------------- At bedside, patient able to tell me his first and last name, age, location, current calendar year.   He reports he has been short of breath for the last 2 to 3 days.  He reports no cough, nausea, vomiting, fever, chills, chest pain, abdominal pain, dysuria, hematuria, diarrhea.  He denies swelling of his lower extremity, syncope, loss of consciousness.   He endorsed being unsteady on his feet.  Increased falls over the last few weeks.   He reports no baseline use of oxygen.  He reports no history of tremors if he does not drink alcohol.  He denies history of seizures.   Social history: He lives at home with his wife.  He denies smoking cigarettes.  He endorses chewing tobacco.  He denies recreational drug use.  He drinks  alcohol and his preferred beverage is Chardonnay.  He states that he usually starts with 8 to 10 ounces of Chardonnay for lunch and 8 to 10 ounces of Chardonnay at the night On his porch.  Daughter at bedside states that he does drink bourbon occasionally.  Discharge Diagnoses:  Principal Problem:   Acute hypoxic respiratory failure (HCC) Active Problems:   CAP (community acquired pneumonia)   LFT elevation   SIRS due to infectious process with acute organ dysfunction (HCC)   Cirrhosis (HCC)   Elevated troponin   ED (erectile dysfunction) of organic origin   White coat syndrome with high blood pressure without hypertension   Ecchymosis of eye, initial encounter   Ecchymosis on examination   Traumatic ecchymosis   Traumatic ecchymosis of cheek   Benign prostatic hyperplasia with urinary obstruction   Alcohol use disorder   Lactic acid blood increased   Pressure injury of skin  Acute hypoxic respiratory failure: found to be 87% on RA. Likely secondary to CAP. Continue on IV abxs. Unable to wean from supplemental oxygen so d/c home w/ 2L [place].    Sepsis: met criteria w/ tachycardia, tachypnea, elevated lactic acid & secondary to pneumonia. Continue on IV rocephin , azithromycin . Blood cxs NGTD. Sepsis resolved   Transaminitis: likely secondary to chronic alcohol abuse. Will continue to monitor    CAP: continue on IV rocephin , azithromycin  while inpatient & d/c home po augmentin , azithromycin . Continue on bronchodilators & encourage incentive spirometry   Elevated troponin: likely secondary to demand ischemia.   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 Not taking lasix, aldactone, lactulose as per pt's med rec. Pt's daughter requested inpatient GI consult so Dr. Onita was consulted. Has outpatient GI appt in Oct 2025.     Traumatic ecchymosis of the face: present on admission. Patient fell trying to open the garage door about 1 to 2 weeks ago  Alcohol use disorder: received  cessation counseling already. 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 No need for a transfusion currently    Thrombocytopenia: secondary to cirrhosis. No need for a transfusion current  Discharge Instructions  Discharge Instructions     Diet - low sodium heart healthy   Complete by: As directed    Discharge instructions   Complete by: As directed    F/u w/ PCP in 1-2 weeks. PCP can retest you to see if you still need supplemental oxygen. F/u GI at previously scheduled appointment.   Discharge wound care:   Complete by: As directed    Bilateral buttock: Cleanse with vashe #[id number]. Apply Santyl  on the wound bed, cover with foam dressing. Change daily or PRN.   Increase activity slowly   Complete by: As directed       Allergies as of 12/22/2023   No Known Allergies      Medication List     STOP taking these medications    OVER THE COUNTER MEDICATION   OVER THE COUNTER MEDICATION   OVER THE COUNTER MEDICATION   OVER THE COUNTER MEDICATION   OVER THE COUNTER MEDICATION   OVER THE COUNTER MEDICATION   Saw Palmetto 160 MG Tabs   sulfamethoxazole -trimethoprim  800-160 MG tablet Commonly known as: Bactrim  DS   traMADol  50 MG tablet Commonly known as: ULTRAM        TAKE these medications    amoxicillin -clavulanate 875-125 MG tablet Commonly known as: AUGMENTIN  Take 1 tablet by mouth 2 (two) times daily for 4 days.   azithromycin  250 MG tablet Commonly known as: Zithromax  Z-Pak Take 1 tablet (250 mg total) by mouth daily for 4 days.   Boron 6 MG Tabs Take by mouth daily.   clotrimazole-betamethasone cream Commonly known as: LOTRISONE Apply topically.   loperamide  2 MG capsule Commonly known as: IMODIUM  Take 1 capsule (2 mg total) by mouth as needed for diarrhea or loose stools.   mirtazapine  7.5 MG tablet Commonly known as: REMERON  Take 1 tablet (7.5 mg total) by mouth at bedtime.   multivitamin with minerals Tabs  tablet Take 1 tablet by mouth 2 (two) times daily.   mupirocin ointment 2 % Commonly known as: BACTROBAN SMARTSIG:1 Application Topical 2-3 Times Daily   OVER THE COUNTER MEDICATION Take 20,000 mcg by mouth daily. Vitamin B12-methylfolate   sertraline  50 MG tablet Commonly known as: ZOLOFT  Take 50 mg by mouth daily.   tadalafil  20 MG tablet Commonly known as: CIALIS  TAKE ONE TABLET DAILY AS NEEDED FOR ERECTILE DYSFUNCTION   tamsulosin  0.4 MG Caps capsule Commonly known as: FLOMAX  Take 2 capsules (0.8 mg total) by mouth daily.   Testosterone  75 MG Pllt by Implant route.   thyroid  60 MG tablet Commonly known as: NP Thyroid  Take 1 tablet (60 mg total) by mouth daily.   Zinc  Oxide 10 % Oint Apply twice daily to sacral decubitus ulcer               Durable Medical Equipment  (From admission, onward)           Start     Ordered  12/22/23 1156  For home use only DME oxygen  Once       Question Answer Comment  Length of Need 6 Months   Mode or (Route) Nasal cannula   Liters per Minute 2   Frequency Continuous (stationary and portable oxygen unit needed)   Oxygen conserving device Yes   Oxygen delivery system Gas      12/22/23 1155              Discharge Care Instructions  (From admission, onward)           Start     Ordered   12/22/23 0000  Discharge wound care:       Comments: Bilateral buttock: Cleanse with vashe #[id number]. Apply Santyl  on the wound bed, cover with foam dressing. Change daily or PRN.   12/22/23 1242            Follow-up Information     Marylynn Verneita CROME, MD Follow up.   Specialty: Internal Medicine Why: hospital follow up Contact information: [address] [place] [address] [phone]                No Known Allergies  Consultations: GI (as per pt's family request)   Procedures/Studies: CT Chest Wo Contrast Result Date: 12/20/2023 CLINICAL DATA:  Respiratory illness. EXAM: CT  CHEST WITHOUT CONTRAST TECHNIQUE: Multidetector CT imaging of the chest was performed following the standard protocol without IV contrast. RADIATION DOSE REDUCTION: This exam was performed according to the departmental dose-optimization program which includes automated exposure control, adjustment of the mA and/or kV according to patient size and/or use of iterative reconstruction technique. COMPARISON:  Chest radiograph dated 12/20/2023. FINDINGS: Evaluation of this exam is limited in the absence of intravenous contrast. Cardiovascular: There is no cardiomegaly or pericardial effusion. There is 3 vessel coronary vascular calcification. Mild atherosclerotic calcification of the thoracic aorta. No evidence of bowel dilatation. The central pulmonary arteries are grossly unremarkable. Mediastinum/Nodes: No hilar or mediastinal adenopathy. The esophagus is grossly unremarkable. No mediastinal fluid collection. Lungs/Pleura: Mild bilateral lower lobe bronchiectasis. Diffuse ground-glass density in the lower lobes and lower lung fields as well as interstitial and subpleural densities likely represent pneumonia. Underlying interstitial lung disease is not excluded but favored less likely. Biapical calcified pleural plaques. Trace left pleural effusion. No pneumothorax. Upper Abdomen: Fatty liver and cirrhosis. A 3 mm nonobstructing left renal stone. Musculoskeletal: Degenerative changes of the spine. No acute osseous pathology. IMPRESSION: 1. Bilateral lower lobe and lower lung field predominant densities likely represent pneumonia. Clinical correlation and follow-up recommended. 2. Fatty liver and cirrhosis. 3. A 3 mm nonobstructing left renal stone. 4.  Aortic Atherosclerosis (ICD10-I70.0). Electronically Signed   By: Vanetta Chou M.D.   On: 12/20/2023 14:20   DG Chest 2 View Result Date: 12/20/2023 CLINICAL DATA:  Shortness of breath, liver failure EXAM: CHEST - 2 VIEW COMPARISON:  12/13/2023 FINDINGS: Lower  thoracic spondylosis. Atherosclerotic calcification of the aortic arch. Biapical pleuroparenchymal scarring with associated calcifications. Reticular interstitial accentuation especially posteriorly in the lower lobes, although judged to be mildly improved from 12/13/2023. IMPRESSION: 1. Reticular interstitial accentuation especially posteriorly in the lower lobes, although judged to be mildly improved from 12/13/2023. This could reflect resolving noncardiogenic edema or resolving infection. 2. Lower thoracic spondylosis. 3. Aortic Atherosclerosis (ICD10-I70.0). Electronically Signed   By: Ryan Salvage M.D.   On: 12/20/2023 13:15   CT ABDOMEN PELVIS W CONTRAST Result Date: 12/13/2023 EXAM: CT ABDOMEN AND PELVIS WITH CONTRAST 12/13/2023  11:11:54 PM TECHNIQUE: CT of the abdomen and pelvis was performed with the administration of intravenous contrast. Multiplanar reformatted images are provided for review. Automated exposure control, iterative reconstruction, and/or weight based adjustment of the mA/kV was utilized to reduce the radiation dose to as low as reasonably achievable. COMPARISON: MR abdomen dated 04/08/2022. CLINICAL HISTORY: Elevated LFTs, concerns for mass. Pt reports having bloodwork done today and was sent over by Doctor Tullo for abnormal results, pt unable to recall results. Pt reports having sore on his bottom x 2 months. Pt reports weakness x 1 week. Denies CP or SOB. FINDINGS: LOWER CHEST: Subpleural patchy opacities in the bilateral lower lobes favoring mild infection/pneumonia over postinfectious/inflammatory scarring. LIVER: Cirrhosis. Severe hepatic steatosis with scattered areas of focal fatty sparing, including in the posterior right hepatic lobe, although non-mass-like. GALLBLADDER AND BILE DUCTS: Gallbladder is unremarkable. No biliary ductal dilatation. SPLEEN: No acute abnormality. PANCREAS: No acute abnormality. ADRENAL GLANDS: No acute abnormality. KIDNEYS, URETERS AND BLADDER:  No stones in the kidneys or ureters. No hydronephrosis. No perinephric or periureteral stranding. Urinary bladder is unremarkable. GI AND BOWEL: Stomach demonstrates no acute abnormality. There is no bowel obstruction. No bowel wall thickening. Scattered colonic diverticulosis, without evidence of diverticulitis. Appendix is not discretely visualized. PERITONEUM AND RETROPERITONEUM: No ascites. No free air. VASCULATURE: Atherosclerotic calcifications of the abdominal aorta and branch vessels, although patent. Portal vein is patent. LYMPH NODES: No lymphadenopathy. REPRODUCTIVE ORGANS: No acute abnormality. BONES AND SOFT TISSUES: No acute osseous abnormality. Small fat-containing right inguinal hernia. IMPRESSION: 1. Cirrhosis and severe hepatic steatosis. No suspicious hepatic mass. 2. Subpleural patchy opacities in the bilateral lower lobes, favoring mild infection/pneumonia over postinfectious/inflammatory scarring. Electronically signed by: Pinkie Pebbles MD 12/13/2023 11:18 PM EDT RP Workstation: [id number]   DG Chest 1 View Result Date: 12/13/2023 EXAM: 1 VIEW XRAY OF THE CHEST 12/13/2023 09:49:41 PM COMPARISON: 11/04/2019 CLINICAL HISTORY: Weakness. Table formatting from the original note was not included. Pt reports having bloodwork done today and was sent over by Doctor Tullo for abnormal results, pt unable to recall results. Pt reports having sore on his bottom x 2 months. Pt reports weakness x 1 week. FINDINGS: LUNGS AND PLEURA: No focal pulmonary opacity. No pulmonary edema. No pleural effusion. No pneumothorax. HEART AND MEDIASTINUM: No acute abnormality of the cardiac and mediastinal silhouettes. BONES AND SOFT TISSUES: No acute osseous abnormality. IMPRESSION: 1. No acute process. Electronically signed by: Pinkie Pebbles MD 12/13/2023 10:00 PM EDT RP Workstation: [id number]   US  ABDOMEN LIMITED RUQ (LIVER/GB) Result Date: 12/13/2023 CLINICAL DATA:  Transaminitis. EXAM: ULTRASOUND ABDOMEN  LIMITED RIGHT UPPER QUADRANT COMPARISON:  August 01, 2023 FINDINGS: Gallbladder: No gallstones or wall thickening visualized (2.4 mm). No sonographic Murphy sign noted by sonographer. Common bile duct: Diameter: 3.7 mm (partially visualized) Liver: No focal lesion identified. The liver parenchyma is coarse in echotexture and diffusely increased in echogenicity. Portal vein is patent on color Doppler imaging with normal direction of blood flow towards the liver. Other: None. IMPRESSION: Hepatic cirrhosis without focal liver lesions. Electronically Signed   By: Suzen Dials M.D.   On: 12/13/2023 21:48   (Echo, Carotid, EGD, Colonoscopy, ERCP)    Subjective: Pt denies any shortness of breath or cough.   Discharge Exam: Vitals:   12/21/23 1939 12/22/23 0440  BP: (!) 105/59 116/79  Pulse: 94 91  Resp: 18 18  Temp: 98.3 F (36.8 C) 98.2 F (36.8 C)  SpO2: 92% 95%   Vitals:   12/21/23 1234 12/21/23 1814  12/21/23 1939 12/22/23 0440  BP:  111/66 (!) 105/59 116/79  Pulse: 97 95 94 91  Resp:  16 18 18   Temp:  97.7 F (36.5 C) 98.3 F (36.8 C) 98.2 F (36.8 C)  TempSrc:  Oral    SpO2: 93% 92% 92% 95%  Weight:      Height:        General: Pt is alert, awake, not in acute distress. Scleral icterus Cardiovascular: S1/S2 +, no rubs, no gallops Respiratory: decreased breath sounds b/l otherwise clear  Abdominal: Soft, NT, ND, bowel sounds + Extremities: no edema, no cyanosis    The results of significant diagnostics from this hospitalization (including imaging, microbiology, ancillary and laboratory) are listed below for reference.     Microbiology: Recent Results (from the past 240 hours)  SARS Coronavirus 2 by RT PCR (hospital order, performed in [facility] hospital lab) *cepheid single result test* Anterior Nasal Swab     Status: None   Collection Time: 12/20/23  1:45 PM   Specimen: Anterior Nasal Swab  Result Value Ref Range Status   SARS Coronavirus 2 by RT PCR NEGATIVE  NEGATIVE Final    Comment: (NOTE) SARS-CoV-2 target nucleic acids are NOT DETECTED.  The SARS-CoV-2 RNA is generally detectable in upper and lower respiratory specimens during the acute phase of infection. The lowest concentration of SARS-CoV-2 viral copies this assay can detect is 250 copies / mL. A negative result does not preclude SARS-CoV-2 infection and should not be used as the sole basis for treatment or other patient management decisions.  A negative result may occur with improper specimen collection / handling, submission of specimen other than nasopharyngeal swab, presence of viral mutation(s) within the areas targeted by this assay, and inadequate number of viral copies (<250 copies / mL). A negative result must be combined with clinical observations, patient history, and epidemiological information.  Fact Sheet for Patients:   RoadLapTop.co.za  Fact Sheet for Healthcare Providers: http://kim-miller.com/  This test is not yet approved or  cleared by the [place]  FDA and has been authorized for detection and/or diagnosis of SARS-CoV-2 by FDA under an Emergency Use Authorization (EUA).  This EUA will remain in effect (meaning this test can be used) for the duration of the COVID-19 declaration under Section 564(b)(1) of the Act, 21 U.S.C. section 360bbb-3(b)(1), unless the authorization is terminated or revoked sooner.  Performed at [facility], [address] [place], [place] [address]   Blood Culture (routine x 2)     Status: None (Preliminary result)   Collection Time: 12/20/23  3:01 PM   Specimen: BLOOD  Result Value Ref Range Status   Specimen Description BLOOD BLOOD LEFT ARM  Final   Special Requests   Final    BOTTLES DRAWN AEROBIC AND ANAEROBIC Blood Culture adequate volume   Culture   Final    NO GROWTH 2 DAYS Performed at [facility], [address], [place], [place] [address]     Report Status PENDING  Incomplete  Blood Culture (routine x 2)     Status: None (Preliminary result)   Collection Time: 12/20/23  3:01 PM   Specimen: BLOOD  Result Value Ref Range Status   Specimen Description BLOOD BLOOD RIGHT ARM  Final   Special Requests   Final    BOTTLES DRAWN AEROBIC AND ANAEROBIC Blood Culture adequate volume   Culture   Final    NO GROWTH 2 DAYS Performed at [facility], [address]  [place], [place] [address]    Report Status PENDING  Incomplete  MRSA Next Gen by PCR, Nasal     Status: None   Collection Time: 12/20/23  9:48 PM   Specimen: Nasal Mucosa; Nasal Swab  Result Value Ref Range Status   MRSA by PCR Next Gen NOT DETECTED NOT DETECTED Final    Comment: (NOTE) The GeneXpert MRSA Assay (FDA approved for NASAL specimens only), is one component of a comprehensive MRSA colonization surveillance program. It is not intended to diagnose MRSA infection nor to guide or monitor treatment for MRSA infections. Test performance is not FDA approved in patients less than 44 years old. Performed at [facility], [address] [place], [place] [address]      Labs: BNP (last 3 results) No results for input(s): BNP in the last 8760 hours. Basic Metabolic Panel: Recent Labs  Lab 12/20/23 1054 12/20/23 1232 12/21/23 0429 12/22/23 0409  NA 136 137 136 135  K 4.0 4.4 3.8 3.4*  CL 100 101 103 106  CO2 22 19* 23 22  GLUCOSE 110* 115* 108* 102*  BUN 23 24* 25* 25*  CREATININE 0.87 0.72 0.66 0.60*  CALCIUM 8.5 8.7* 8.6* 8.1*   Liver Function Tests: Recent Labs  Lab 12/20/23 1054 12/20/23 1232 12/21/23 1106 12/22/23 0409  AST 266* 318* 243* 192*  ALT 121* 132* 112* 92*  ALKPHOS 202* 184* 168* 142*  BILITOT 10.3* 11.1* 8.9* 7.5*  PROT 5.7* 6.1* 5.8* 5.2*  ALBUMIN 3.1* 2.7* 2.4* 2.2*   Recent Labs  Lab 12/20/23 1232  LIPASE 45   No results for input(s): AMMONIA in the last 168 hours. CBC: Recent Labs  Lab  12/20/23 1054 12/20/23 1232 12/21/23 0429 12/22/23 0409  WBC 6.1 8.1 4.7 4.0  NEUTROABS 5.1  --   --   --   HGB 11.2* 11.6* 9.1* 8.8*  HCT 32.3* 33.8* 26.6* 25.3*  MCV 107.1* 106.6* 105.6* 105.0*  PLT 134.0* 149* 102* 101*   Cardiac Enzymes: No results for input(s): CKTOTAL, CKMB, CKMBINDEX, TROPONINI in the last 168 hours. BNP: Invalid input(s): POCBNP CBG: No results for input(s): GLUCAP in the last 168 hours. D-Dimer No results for input(s): DDIMER in the last 72 hours. Hgb A1c No results for input(s): HGBA1C in the last 72 hours. Lipid Profile No results for input(s): CHOL, HDL, LDLCALC, TRIG, CHOLHDL, LDLDIRECT in the last 72 hours. Thyroid  function studies No results for input(s): TSH, T4TOTAL, T3FREE, THYROIDAB in the last 72 hours.  Invalid input(s): FREET3 Anemia work up No results for input(s): VITAMINB12, FOLATE, FERRITIN, TIBC, IRON, RETICCTPCT in the last 72 hours. Urinalysis    Component Value Date/Time   COLORURINE YELLOW (A) 12/13/2023 2329   APPEARANCEUR CLEAR (A) 12/13/2023 2329   APPEARANCEUR Clear 04/23/2022 1510   LABSPEC 1.004 (L) 12/13/2023 2329   PHURINE 7.0 12/13/2023 2329   GLUCOSEU NEGATIVE 12/13/2023 2329   HGBUR NEGATIVE 12/13/2023 2329   BILIRUBINUR NEGATIVE 12/13/2023 2329   BILIRUBINUR Negative 04/23/2022 1510   KETONESUR NEGATIVE 12/13/2023 2329   PROTEINUR NEGATIVE 12/13/2023 2329   NITRITE NEGATIVE 12/13/2023 2329   LEUKOCYTESUR NEGATIVE 12/13/2023 2329   Sepsis Labs Recent Labs  Lab 12/20/23 1054 12/20/23 1232 12/21/23 0429 12/22/23 0409  WBC 6.1 8.1 4.7 4.0   Microbiology Recent Results (from the past 240 hours)  SARS Coronavirus 2 by RT PCR (hospital order, performed in [facility] hospital lab) *cepheid single result test* Anterior Nasal Swab     Status: None   Collection  Time: 12/20/23  1:45 PM   Specimen: Anterior Nasal Swab  Result Value Ref Range Status   SARS  Coronavirus 2 by RT PCR NEGATIVE NEGATIVE Final    Comment: (NOTE) SARS-CoV-2 target nucleic acids are NOT DETECTED.  The SARS-CoV-2 RNA is generally detectable in upper and lower respiratory specimens during the acute phase of infection. The lowest concentration of SARS-CoV-2 viral copies this assay can detect is 250 copies / mL. A negative result does not preclude SARS-CoV-2 infection and should not be used as the sole basis for treatment or other patient management decisions.  A negative result may occur with improper specimen collection / handling, submission of specimen other than nasopharyngeal swab, presence of viral mutation(s) within the areas targeted by this assay, and inadequate number of viral copies (<250 copies / mL). A negative result must be combined with clinical observations, patient history, and epidemiological information.  Fact Sheet for Patients:   RoadLapTop.co.za  Fact Sheet for Healthcare Providers: http://kim-miller.com/  This test is not yet approved or  cleared by the [place]  FDA and has been authorized for detection and/or diagnosis of SARS-CoV-2 by FDA under an Emergency Use Authorization (EUA).  This EUA will remain in effect (meaning this test can be used) for the duration of the COVID-19 declaration under Section 564(b)(1) of the Act, 21 U.S.C. section 360bbb-3(b)(1), unless the authorization is terminated or revoked sooner.  Performed at [facility], [address] [place], [place] [address]   Blood Culture (routine x 2)     Status: None (Preliminary result)   Collection Time: 12/20/23  3:01 PM   Specimen: BLOOD  Result Value Ref Range Status   Specimen Description BLOOD BLOOD LEFT ARM  Final   Special Requests   Final    BOTTLES DRAWN AEROBIC AND ANAEROBIC Blood Culture adequate volume   Culture   Final    NO GROWTH 2 DAYS Performed at [facility], [address] [place], [place] [address]    Report Status PENDING  Incomplete  Blood Culture (routine x 2)     Status: None (Preliminary result)   Collection Time: 12/20/23  3:01 PM   Specimen: BLOOD  Result Value Ref Range Status   Specimen Description BLOOD BLOOD RIGHT ARM  Final   Special Requests   Final    BOTTLES DRAWN AEROBIC AND ANAEROBIC Blood Culture adequate volume   Culture   Final    NO GROWTH 2 DAYS Performed at [facility], [address] [place], [place] [address]    Report Status PENDING  Incomplete  MRSA Next Gen by PCR, Nasal     Status: None   Collection Time: 12/20/23  9:48 PM   Specimen: Nasal Mucosa; Nasal Swab  Result Value Ref Range Status   MRSA by PCR Next Gen NOT DETECTED NOT DETECTED Final    Comment: (NOTE) The GeneXpert MRSA Assay (FDA approved for NASAL specimens only), is one component of a comprehensive MRSA colonization surveillance program. It is not intended to diagnose MRSA infection nor to guide or monitor treatment for MRSA infections. Test performance is not FDA approved in patients less than 65 years old. Performed at [facility], [address] [place], [place] [address]      Time coordinating discharge: 33 minutes  SIGNED:   Anthony CHRISTELLA Pouch, MD  [facility] 12/22/2023, 12:42 PM Pager   If 7PM-7AM, please contact night-coverage [email or web address]

## 2023-12-22 NOTE — TOC CM/SW Note (Signed)
..  Transition of Care [facility]) - Inpatient Brief Assessment   Patient Details  Name: Tony Luna MRN: [id number] Date of Birth: 04-28-1941  Transition of Care [facility]) CM/SW Contact:    Edsel [name], LCSW Phone Number: 12/22/2023, 1:27 PM   Clinical Narrative:  SW contacted [employer] for DME: Oxygen. Referral complete.  SW contacted pt daughter.  No answer  Transition of Care Asessment:                 i

## 2023-12-23 ENCOUNTER — Telehealth: Payer: Self-pay

## 2023-12-23 ENCOUNTER — Encounter: Payer: Self-pay | Admitting: Internal Medicine

## 2023-12-23 DIAGNOSIS — J189 Pneumonia, unspecified organism: Secondary | ICD-10-CM | POA: Diagnosis not present

## 2023-12-23 DIAGNOSIS — J9601 Acute respiratory failure with hypoxia: Secondary | ICD-10-CM | POA: Diagnosis not present

## 2023-12-23 MED ORDER — AZITHROMYCIN 250 MG PO TABS: 250.0000 mg | ORAL_TABLET | Freq: Every day | ORAL | 0 refills | Status: DC

## 2023-12-23 MED ORDER — AMOXICILLIN-POT CLAVULANATE 875-125 MG PO TABS: 1.0000 | ORAL_TABLET | Freq: Two times a day (BID) | ORAL | 0 refills | Status: DC

## 2023-12-23 NOTE — Addendum Note (Signed)
 Addended by: MARYLYNN VERNEITA CROME on: 12/23/2023 12:14 PM   Modules accepted: Orders

## 2023-12-23 NOTE — Transitions of Care (Post Inpatient/ED Visit) (Unsigned)
   12/23/2023  Name: Tony Luna MRN: [id number] DOB: May 08, 1941  Today's TOC FU Call Status: Today's TOC FU Call Status:: Unsuccessful Call (1st Attempt) Unsuccessful Call (1st Attempt) Date: 12/23/23  Attempted to reach the patient regarding the most recent Inpatient/ED visit.  Follow Up Plan: Additional outreach attempts will be made to reach the patient to complete the Transitions of Care (Post Inpatient/ED visit) call.   Signature Julian Lemmings, LPN [facility] Nurse Health Advisor Direct Dial [phone]

## 2023-12-23 NOTE — Telephone Encounter (Signed)
Pt's daughter is aware that medications have been sent in.

## 2023-12-23 NOTE — Telephone Encounter (Signed)
 Copied from CRM [id number]. Topic: Clinical - Medication Question >> Dec 23, 2023  9:09 AM Antonio H wrote: Reason for CRM: Patient was discharged from the hospital and a written prescription for amoxicillin -clavulanate (AUGMENTIN ) 875-125 MG tablet and azithromycin  (ZITHROMAX  Z-PAK) 250 MG tablet was wrote but patient's daughter Lauraine stated they never received the scripts and is wanting to know if Dr. Marylynn can send the scripts for antibiotics to [facility] or she can come pick them up. She would like a call back from Dr. Lula nurse, her cb number [phone].

## 2023-12-24 NOTE — Transitions of Care (Post Inpatient/ED Visit) (Signed)
 12/24/2023  Name: Tony Luna MRN: [id number] DOB: 02-Feb-1941  Today's TOC FU Call Status: Today's TOC FU Call Status:: Successful TOC FU Call Completed Unsuccessful Call (1st Attempt) Date: 12/23/23 [facility] FU Call Complete Date: 12/24/23 Patient's Name and Date of Birth confirmed.  Transition Care Management Follow-up Telephone Call Date of Discharge: 12/22/23 Discharge Facility: [facility]) Type of Discharge: Inpatient Admission Primary Inpatient Discharge Diagnosis:: RF How have you been since you were released from the hospital?: Better Any questions or concerns?: No  Items Reviewed: Did you receive and understand the discharge instructions provided?: Yes Medications obtained,verified, and reconciled?: Yes (Medications Reviewed) Any new allergies since your discharge?: No Dietary orders reviewed?: Yes Do you have support at home?: Yes Name of Support/Comfort Primary Source: living at [facility]  Medications Reviewed Today: Medications Reviewed Today     Reviewed by Emmitt Pan, LPN (Licensed Practical Nurse) on 12/24/23 at 1239  Med List Status: <None>   Medication Order Taking? Sig Documenting Provider Last Dose Status Informant  amoxicillin -clavulanate (AUGMENTIN ) 875-125 MG tablet [id number] Yes Take 1 tablet by mouth 2 (two) times daily for 4 days. Marylynn Verneita CROME, MD  Active   azithromycin  (ZITHROMAX  Z-PAK) 250 MG tablet [id number] Yes Take 1 tablet (250 mg total) by mouth daily for 4 days. Marylynn Verneita CROME, MD  Active   Boron 6 MG TABS [id number] Yes Take by mouth daily. [provider]  Active Child  clotrimazole-betamethasone (LOTRISONE) cream [id number] Yes Apply topically. [provider]  Active Child  denosumab  (PROLIA ) injection 60 mg [id number]   Tullo, Teresa L, MD  Active   loperamide  (IMODIUM ) 2 MG capsule [id number] Yes Take 1 capsule (2 mg total) by mouth as needed for diarrhea or loose stools. Hope Merle, MD   Active Child  mirtazapine  (REMERON ) 7.5 MG tablet [id number] Yes Take 1 tablet (7.5 mg total) by mouth at bedtime. Marylynn Verneita CROME, MD  Active Child  Multiple Vitamin (MULTIVITAMIN WITH MINERALS) TABS tablet [id number] Yes Take 1 tablet by mouth 2 (two) times daily. [provider]  Active Child           Med Note GERRE ALVIN GORMAN Pablo Jul 23, 2022  3:11 PM)    mupirocin ointment (BACTROBAN) 2 % [id number] Yes SMARTSIG:1 Application Topical 2-3 Times Daily [provider]  Active Child  OVER THE COUNTER MEDICATION [id number] Yes Take 20,000 mcg by mouth daily. Vitamin B12-methylfolate [provider]  Active Child  sertraline  (ZOLOFT ) 50 MG tablet [id number] Yes Take 50 mg by mouth daily. [provider]  Active Child  tadalafil  (CIALIS ) 20 MG tablet [id number] Yes TAKE ONE TABLET DAILY AS NEEDED FOR ERECTILE DYSFUNCTION Marylynn Verneita CROME, MD  Active Child  tamsulosin  (FLOMAX ) 0.4 MG CAPS capsule [id number] Yes Take 2 capsules (0.8 mg total) by mouth daily. Hope Merle, MD  Active Child  Testosterone  75 MG PLLT [id number] Yes by Implant route. [provider]  Active Child  thyroid  (NP THYROID ) 60 MG tablet [id number] Yes Take 1 tablet (60 mg total) by mouth daily. Marylynn Verneita CROME, MD  Active Child  Zinc  Oxide 10 % OINT [id number] Yes Apply twice daily to sacral decubitus ulcer Marylynn Verneita CROME, MD  Active Child            Home Care and Equipment/Supplies: Were Home Health Services Ordered?: NA Any new equipment or medical supplies ordered?: NA  Functional Questionnaire: Do you need assistance with bathing/showering or dressing?: Yes Do  you need assistance with meal preparation?: Yes Do you need assistance with eating?: No Do you have difficulty maintaining continence: No Do you need assistance with getting out of bed/getting out of a chair/moving?: No Do you have difficulty managing or taking your medications?: Yes  Follow up appointments  reviewed: PCP Follow-up appointment confirmed?: Yes Date of PCP follow-up appointment?: 12/31/23 Follow-up Provider: [facility] Follow-up appointment confirmed?: NA Do you need transportation to your follow-up appointment?: No Do you understand care options if your condition(s) worsen?: Yes-patient verbalized understanding    SIGNATURE Julian Lemmings, LPN [facility] Nurse Health Advisor Direct Dial [phone]

## 2023-12-25 ENCOUNTER — Ambulatory Visit: Admitting: Physician Assistant

## 2023-12-25 ENCOUNTER — Encounter: Payer: Self-pay | Admitting: Urology

## 2023-12-25 LAB — CULTURE, BLOOD (ROUTINE X 2)
Culture: NO GROWTH
Culture: NO GROWTH
Special Requests: ADEQUATE
Special Requests: ADEQUATE

## 2023-12-26 ENCOUNTER — Observation Stay: Admitting: Certified Registered"

## 2023-12-26 ENCOUNTER — Inpatient Hospital Stay
Admission: EM | Admit: 2023-12-26 | Discharge: 2024-01-01 | DRG: 377 | Disposition: A | Discharge status: Skilled Nursing Facility | Attending: Student | Admitting: Student

## 2023-12-26 ENCOUNTER — Observation Stay

## 2023-12-26 ENCOUNTER — Encounter: Payer: Self-pay | Admitting: Emergency Medicine

## 2023-12-26 ENCOUNTER — Other Ambulatory Visit: Payer: Self-pay

## 2023-12-26 ENCOUNTER — Encounter
Admission: EM | Disposition: A | Discharge status: Skilled Nursing Facility | Payer: Self-pay | Source: Home / Self Care | Attending: Student

## 2023-12-26 DIAGNOSIS — I2510 Atherosclerotic heart disease of native coronary artery without angina pectoris: Secondary | ICD-10-CM | POA: Diagnosis present

## 2023-12-26 DIAGNOSIS — K766 Portal hypertension: Secondary | ICD-10-CM | POA: Diagnosis present

## 2023-12-26 DIAGNOSIS — Z8261 Family history of arthritis: Secondary | ICD-10-CM

## 2023-12-26 DIAGNOSIS — R0902 Hypoxemia: Secondary | ICD-10-CM | POA: Diagnosis not present

## 2023-12-26 DIAGNOSIS — Z811 Family history of alcohol abuse and dependence: Secondary | ICD-10-CM

## 2023-12-26 DIAGNOSIS — I351 Nonrheumatic aortic (valve) insufficiency: Secondary | ICD-10-CM | POA: Diagnosis present

## 2023-12-26 DIAGNOSIS — N401 Enlarged prostate with lower urinary tract symptoms: Secondary | ICD-10-CM | POA: Diagnosis present

## 2023-12-26 DIAGNOSIS — L89151 Pressure ulcer of sacral region, stage 1: Secondary | ICD-10-CM | POA: Diagnosis present

## 2023-12-26 DIAGNOSIS — K3189 Other diseases of stomach and duodenum: Secondary | ICD-10-CM | POA: Diagnosis present

## 2023-12-26 DIAGNOSIS — K264 Chronic or unspecified duodenal ulcer with hemorrhage: Principal | ICD-10-CM | POA: Diagnosis present

## 2023-12-26 DIAGNOSIS — J189 Pneumonia, unspecified organism: Secondary | ICD-10-CM | POA: Diagnosis not present

## 2023-12-26 DIAGNOSIS — F1090 Alcohol use, unspecified, uncomplicated: Secondary | ICD-10-CM | POA: Diagnosis present

## 2023-12-26 DIAGNOSIS — F1019 Alcohol abuse with unspecified alcohol-induced disorder: Secondary | ICD-10-CM | POA: Diagnosis present

## 2023-12-26 DIAGNOSIS — E876 Hypokalemia: Secondary | ICD-10-CM | POA: Diagnosis present

## 2023-12-26 DIAGNOSIS — Z8719 Personal history of other diseases of the digestive system: Secondary | ICD-10-CM

## 2023-12-26 DIAGNOSIS — D62 Acute posthemorrhagic anemia: Secondary | ICD-10-CM | POA: Diagnosis present

## 2023-12-26 DIAGNOSIS — L03311 Cellulitis of abdominal wall: Secondary | ICD-10-CM | POA: Diagnosis not present

## 2023-12-26 DIAGNOSIS — I10 Essential (primary) hypertension: Secondary | ICD-10-CM | POA: Diagnosis not present

## 2023-12-26 DIAGNOSIS — Z810 Family history of intellectual disabilities: Secondary | ICD-10-CM

## 2023-12-26 DIAGNOSIS — K7030 Alcoholic cirrhosis of liver without ascites: Secondary | ICD-10-CM | POA: Diagnosis present

## 2023-12-26 DIAGNOSIS — E43 Unspecified severe protein-calorie malnutrition: Secondary | ICD-10-CM | POA: Diagnosis present

## 2023-12-26 DIAGNOSIS — L0390 Cellulitis, unspecified: Secondary | ICD-10-CM

## 2023-12-26 DIAGNOSIS — E039 Hypothyroidism, unspecified: Secondary | ICD-10-CM | POA: Diagnosis present

## 2023-12-26 DIAGNOSIS — D6959 Other secondary thrombocytopenia: Secondary | ICD-10-CM | POA: Diagnosis present

## 2023-12-26 DIAGNOSIS — Z833 Family history of diabetes mellitus: Secondary | ICD-10-CM

## 2023-12-26 DIAGNOSIS — F1010 Alcohol abuse, uncomplicated: Secondary | ICD-10-CM | POA: Diagnosis present

## 2023-12-26 DIAGNOSIS — F10188 Alcohol abuse with other alcohol-induced disorder: Secondary | ICD-10-CM | POA: Diagnosis present

## 2023-12-26 DIAGNOSIS — K921 Melena: Principal | ICD-10-CM | POA: Diagnosis present

## 2023-12-26 DIAGNOSIS — D696 Thrombocytopenia, unspecified: Secondary | ICD-10-CM | POA: Diagnosis present

## 2023-12-26 DIAGNOSIS — N138 Other obstructive and reflux uropathy: Secondary | ICD-10-CM | POA: Diagnosis present

## 2023-12-26 DIAGNOSIS — D689 Coagulation defect, unspecified: Secondary | ICD-10-CM | POA: Diagnosis present

## 2023-12-26 DIAGNOSIS — L89313 Pressure ulcer of right buttock, stage 3: Secondary | ICD-10-CM | POA: Diagnosis present

## 2023-12-26 DIAGNOSIS — R58 Hemorrhage, not elsewhere classified: Secondary | ICD-10-CM | POA: Diagnosis not present

## 2023-12-26 DIAGNOSIS — Z96652 Presence of left artificial knee joint: Secondary | ICD-10-CM | POA: Diagnosis present

## 2023-12-26 DIAGNOSIS — K222 Esophageal obstruction: Secondary | ICD-10-CM | POA: Diagnosis not present

## 2023-12-26 DIAGNOSIS — I350 Nonrheumatic aortic (valve) stenosis: Secondary | ICD-10-CM

## 2023-12-26 DIAGNOSIS — L89323 Pressure ulcer of left buttock, stage 3: Secondary | ICD-10-CM | POA: Diagnosis present

## 2023-12-26 DIAGNOSIS — K625 Hemorrhage of anus and rectum: Secondary | ICD-10-CM | POA: Diagnosis not present

## 2023-12-26 DIAGNOSIS — Z822 Family history of deafness and hearing loss: Secondary | ICD-10-CM

## 2023-12-26 DIAGNOSIS — Z6821 Body mass index (BMI) 21.0-21.9, adult: Secondary | ICD-10-CM

## 2023-12-26 DIAGNOSIS — D539 Nutritional anemia, unspecified: Secondary | ICD-10-CM | POA: Diagnosis present

## 2023-12-26 DIAGNOSIS — I700 Atherosclerosis of aorta: Secondary | ICD-10-CM | POA: Diagnosis present

## 2023-12-26 DIAGNOSIS — I352 Nonrheumatic aortic (valve) stenosis with insufficiency: Secondary | ICD-10-CM | POA: Diagnosis present

## 2023-12-26 DIAGNOSIS — Z87891 Personal history of nicotine dependence: Secondary | ICD-10-CM | POA: Diagnosis not present

## 2023-12-26 DIAGNOSIS — R7401 Elevation of levels of liver transaminase levels: Secondary | ICD-10-CM | POA: Diagnosis present

## 2023-12-26 DIAGNOSIS — Z66 Do not resuscitate: Secondary | ICD-10-CM | POA: Diagnosis present

## 2023-12-26 DIAGNOSIS — K922 Gastrointestinal hemorrhage, unspecified: Secondary | ICD-10-CM | POA: Diagnosis present

## 2023-12-26 DIAGNOSIS — R54 Age-related physical debility: Secondary | ICD-10-CM

## 2023-12-26 HISTORY — PX: ESOPHAGOGASTRODUODENOSCOPY: SHX5428

## 2023-12-26 HISTORY — PX: HOT HEMOSTASIS: SHX5433

## 2023-12-26 HISTORY — PX: SUBMUCOSAL INJECTION: SHX5543

## 2023-12-26 LAB — MAGNESIUM: Magnesium: 1.7 mg/dL (ref 1.7–2.4)

## 2023-12-26 LAB — CBC
HCT: 24 % — ABNORMAL LOW (ref 39.0–52.0)
Hemoglobin: 8 g/dL — ABNORMAL LOW (ref 13.0–17.0)
MCH: 37 pg — ABNORMAL HIGH (ref 26.0–34.0)
MCHC: 33.3 g/dL (ref 30.0–36.0)
MCV: 111.1 fL — ABNORMAL HIGH (ref 80.0–100.0)
Platelets: 131 K/uL — ABNORMAL LOW (ref 150–400)
RBC: 2.16 MIL/uL — ABNORMAL LOW (ref 4.22–5.81)
RDW: 13.4 % (ref 11.5–15.5)
WBC: 6.5 K/uL (ref 4.0–10.5)
nRBC: 0 % (ref 0.0–0.2)

## 2023-12-26 LAB — PHOSPHORUS: Phosphorus: 3.1 mg/dL (ref 2.5–4.6)

## 2023-12-26 LAB — COMPREHENSIVE METABOLIC PANEL WITH GFR
ALT: 88 U/L — ABNORMAL HIGH (ref 0–44)
AST: 153 U/L — ABNORMAL HIGH (ref 15–41)
Albumin: 1.9 g/dL — ABNORMAL LOW (ref 3.5–5.0)
Alkaline Phosphatase: 162 U/L — ABNORMAL HIGH (ref 38–126)
Anion gap: 8 (ref 5–15)
BUN: 39 mg/dL — ABNORMAL HIGH (ref 8–23)
CO2: 23 mmol/L (ref 22–32)
Calcium: 9 mg/dL (ref 8.9–10.3)
Chloride: 106 mmol/L (ref 98–111)
Creatinine, Ser: 0.79 mg/dL (ref 0.61–1.24)
GFR, Estimated: >60 mL/min (ref >60)
Glucose, Bld: 114 mg/dL — ABNORMAL HIGH (ref 70–99)
Potassium: 3.3 mmol/L — ABNORMAL LOW (ref 3.5–5.1)
Sodium: 137 mmol/L (ref 135–145)
Total Bilirubin: 6.1 mg/dL — ABNORMAL HIGH (ref 0.0–1.2)
Total Protein: 4.8 g/dL — ABNORMAL LOW (ref 6.5–8.1)

## 2023-12-26 LAB — HEMOGLOBIN AND HEMATOCRIT, BLOOD
HCT: 25.6 % — ABNORMAL LOW (ref 39.0–52.0)
HCT: 29.2 % — ABNORMAL LOW (ref 39.0–52.0)
Hemoglobin: 8.4 g/dL — ABNORMAL LOW (ref 13.0–17.0)
Hemoglobin: 9.6 g/dL — ABNORMAL LOW (ref 13.0–17.0)

## 2023-12-26 LAB — PROTIME-INR
INR: 1.3 — ABNORMAL HIGH (ref 0.8–1.2)
Prothrombin Time: 17.4 s — ABNORMAL HIGH (ref 11.4–15.2)

## 2023-12-26 SURGERY — EGD (ESOPHAGOGASTRODUODENOSCOPY)
Anesthesia: General

## 2023-12-26 MED ORDER — SODIUM CHLORIDE (PF) 0.9 % IJ SOLN
PREFILLED_SYRINGE | INTRAMUSCULAR | Status: DC | PRN
  Administered 2023-12-26: 2 mL

## 2023-12-26 MED ORDER — POTASSIUM CHLORIDE IN NACL 40-0.9 MEQ/L-% IV SOLN
INTRAVENOUS | Status: AC
  Filled 2023-12-26 (×2): qty 1000

## 2023-12-26 MED ORDER — ONDANSETRON HCL 4 MG PO TABS: 4.0000 mg | ORAL_TABLET | Freq: Four times a day (QID) | ORAL | Status: DC | PRN

## 2023-12-26 MED ORDER — SODIUM CHLORIDE 0.9 % IV SOLN
1.0000 g | Freq: Once | INTRAVENOUS | Status: AC
  Administered 2023-12-26: 1 g via INTRAVENOUS
  Filled 2023-12-26: qty 10

## 2023-12-26 MED ORDER — EPINEPHRINE 1 MG/10ML IJ SOSY
PREFILLED_SYRINGE | INTRAMUSCULAR | Status: AC
  Filled 2023-12-26: qty 10

## 2023-12-26 MED ORDER — LIDOCAINE HCL (CARDIAC) PF 100 MG/5ML IV SOSY
PREFILLED_SYRINGE | INTRAVENOUS | Status: DC | PRN
  Administered 2023-12-26: 100 mg via INTRAVENOUS

## 2023-12-26 MED ORDER — SODIUM CHLORIDE 0.9 % IV SOLN: INTRAVENOUS | Status: DC

## 2023-12-26 MED ORDER — PROPOFOL 10 MG/ML IV BOLUS
INTRAVENOUS | Status: AC
  Filled 2023-12-26: qty 20

## 2023-12-26 MED ORDER — SUCCINYLCHOLINE CHLORIDE 200 MG/10ML IV SOSY
PREFILLED_SYRINGE | INTRAVENOUS | Status: DC | PRN
  Administered 2023-12-26: 100 mg via INTRAVENOUS

## 2023-12-26 MED ORDER — VANCOMYCIN HCL 1500 MG/300ML IV SOLN
1500.0000 mg | Freq: Once | INTRAVENOUS | Status: AC
  Administered 2023-12-26: 1500 mg via INTRAVENOUS
  Filled 2023-12-26: qty 300

## 2023-12-26 MED ORDER — ONDANSETRON HCL 4 MG/2ML IJ SOLN: 4.0000 mg | Freq: Four times a day (QID) | INTRAMUSCULAR | Status: DC | PRN

## 2023-12-26 MED ORDER — LABETALOL HCL 5 MG/ML IV SOLN
INTRAVENOUS | Status: DC | PRN
Start: 2023-12-26 — End: 2023-12-26
  Administered 2023-12-26: 5 mg via INTRAVENOUS

## 2023-12-26 MED ORDER — PANTOPRAZOLE SODIUM 40 MG IV SOLR
80.0000 mg | Freq: Once | INTRAVENOUS | Status: AC
  Administered 2023-12-26: 80 mg via INTRAVENOUS
  Filled 2023-12-26: qty 20

## 2023-12-26 MED ORDER — SODIUM CHLORIDE 0.9 % IV SOLN
1.0000 g | INTRAVENOUS | Status: DC
  Administered 2023-12-27 – 2024-01-01 (×6): 1 g via INTRAVENOUS
  Filled 2023-12-26 (×6): qty 10

## 2023-12-26 MED ORDER — PANTOPRAZOLE SODIUM 40 MG IV SOLR
40.0000 mg | Freq: Two times a day (BID) | INTRAVENOUS | Status: DC
  Administered 2023-12-27 – 2024-01-01 (×12): 40 mg via INTRAVENOUS
  Filled 2023-12-26 (×12): qty 10

## 2023-12-26 MED ORDER — PROPOFOL 10 MG/ML IV BOLUS
INTRAVENOUS | Status: DC | PRN
Start: 2023-12-26 — End: 2023-12-26
  Administered 2023-12-26: 100 mg via INTRAVENOUS
  Administered 2023-12-26: 150 ug/kg/min via INTRAVENOUS

## 2023-12-26 MED ORDER — MAGNESIUM SULFATE 2 GM/50ML IV SOLN
2.0000 g | Freq: Once | INTRAVENOUS | Status: AC
  Administered 2023-12-26: 2 g via INTRAVENOUS
  Filled 2023-12-26: qty 50

## 2023-12-26 NOTE — Anesthesia Procedure Notes (Signed)
 Procedure Name: Intubation Date/Time: 12/26/2023 2:11 PM  Performed by: Norleen Alberta HERO., CRNAPre-anesthesia Checklist: Patient identified, Patient being monitored, Timeout performed, Emergency Drugs available and Suction available Patient Re-evaluated:Patient Re-evaluated prior to induction Oxygen Delivery Method: Circle system utilized Preoxygenation: Pre-oxygenation with 100% oxygen Induction Type: IV induction and Rapid sequence Laryngoscope Size: McGrath and 4 Grade View: Grade I Tube type: Oral Tube size: 7.0 mm Number of attempts: 1 Airway Equipment and Method: Stylet Placement Confirmation: ETT inserted through vocal cords under direct vision, positive ETCO2 and breath sounds checked- equal and bilateral Secured at: 21 cm Tube secured with: Tape Dental Injury: Teeth and Oropharynx as per pre-operative assessment

## 2023-12-26 NOTE — Consult Note (Signed)
 Rogelia Copping, MD [facility]  [address], [address] [place], [place] [address] Phone: [phone] Fax : [phone]  Consultation  Referring Provider:     Dr. Adrianna Primary Care Physician:  Marylynn Verneita CROME, MD Primary Gastroenterologist: [facility]         Reason for Consultation:     Melena  Date of Admission:  12/26/2023 Date of Consultation:  12/26/2023         HPI:   Tony Luna is a 83 y.o. male who was discharged from the hospital recently.  Please see the consult note from 4 days ago on 7/26 by Dr. Onita. As per that note the patient has a history of alcohol abuse which the patient states he stopped drinking 6 days ago on Friday.  The patient has a history of compensated cirrhosis with hypothyroidism sleep apnea and was found to have bilateral pneumonia with respiratory failure.  The patient states he is doing much better and is now off of oxygen.  The patient had his wife and daughter at the bedside and reported that he has been having some black stool since he left the hospital and this morning he had black stools with some red blood in it.  There is no report of any nausea vomiting or abdominal pain.  The patient's recent blood work is shown:  Component     Latest Ref Rng 12/20/2023 12/21/2023 12/22/2023 12/26/2023  Hemoglobin     13.0 - 17.0 g/dL 88.3 (L)  9.1 (L)  8.8 (L)  8.0 (L)   Hemoglobin      11.2 (L)      HCT     39.0 - 52.0 % 33.8 (L)  26.6 (L)  25.3 (L)  24.0 (L)   HCT      32.3 (L)       The patient's liver enzymes have also been elevated but have come down since his admission with his labs showing:  Component     Latest Ref Rng 12/20/2023 12/21/2023 12/22/2023 12/26/2023  Total Bilirubin     0.0 - 1.2 mg/dL 88.8 (H)  8.9 (H)  7.5 (H)  6.1 (H)   Total Bilirubin      10.3 (H)      Alkaline Phosphatase     38 - 126 U/L 184 (H)  168 (H)  142 (H)  162 (H)   Alkaline Phosphatase      202 (H)      AST     15 - 41 U/L 318 (H)  243 (H)  192 (H)  153 (H)   AST       266 (H)      ALT     0 - 44 U/L 132 (H)  112 (H)  92 (H)  88 (H)   ALT      121 (H)       The patient's INR has been hovering between 1.3 and 1.4 with today's level being 1.3.   During the last admission the feeling was that the patient had a chronic macrocytic anemia without any sign of active bleeding but now the patient reports blood in his stools and melanotic stools.  The patient's last colonoscopy was done in November 2023 that showed 2 small polyps with diverticulosis in the sigmoid colon and nonbleeding internal hemorrhoids.  Past Medical History:  Diagnosis Date   Allergy    Arthritis    Cirrhosis (HCC)    Complication of anesthesia    :  need catheter   Dysrhythmia    ? something told by reg dr  unable to say what it was   Esophageal tear    Hypothyroidism    Pneumonia    hx   Primary osteoarthritis of left knee 06/22/2014   Rotator cuff tear    Sleep apnea corrected- dental device    Past Surgical History:  Procedure Laterality Date   BACK SURGERY     blepharaplasty     CATARACT EXTRACTION W/PHACO Left 02/21/2021   Procedure: CATARACT EXTRACTION PHACO AND INTRAOCULAR LENS PLACEMENT (IOC) LEFT 4.56 00:34.9;  Surgeon: Jaye Fallow, MD;  Location: [facility];  Service: Ophthalmology;  Laterality: Left;   CAUTERY OF TURBINATES     COLONOSCOPY WITH PROPOFOL  N/A 07/19/2015   Procedure: COLONOSCOPY WITH PROPOFOL ;  Surgeon: Rogelia Copping, MD;  Location: [facility] ENDOSCOPY;  Service: Endoscopy;  Laterality: N/A;   COLONOSCOPY WITH PROPOFOL  N/A 03/21/2021   Procedure: COLONOSCOPY WITH PROPOFOL ;  Surgeon: Copping Rogelia, MD;  Location: [facility] END[facility]COPY;  Service: Endoscopy;  Laterality: N/A;   COLONOSCOPY WITH PROPOFOL  N/A 04/18/2021   Procedure: COLONOSCOPY WITH PROPOFOL ;  Surgeon: Copping Rogelia, MD;  Location: [facility] ENDOSCOPY;  Service: Endoscopy;  Laterality: N/A;   COLONOSCOPY WITH PROPOFOL  N/A 04/24/2022   Procedure: COLONOSCOPY WITH PROPOFOL ;  Surgeon: Copping Rogelia, MD;  Location: [facility] END[facility]COPY;  Service: Endoscopy;  Laterality: N/A;   EYE SURGERY     HERNIA REPAIR Left 20+ years ago   inguinal herniorrhapy   JOINT REPLACEMENT  left knee 2016   KNEE SURGERY     ? 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 Authorizing Provider  amoxicillin -clavulanate (AUGMENTIN ) 875-125 MG tablet Take 1 tablet by mouth 2 (two) times daily for 4 days. 12/23/23 12/27/23  Marylynn Verneita CROME, MD  azithromycin  (ZITHROMAX  Z-PAK) 250 MG tablet Take 1 tablet (250 mg total) by mouth daily for 4 days. 12/23/23 12/27/23  Marylynn Verneita CROME, MD  Boron 6 MG TABS Take by mouth daily.    [provider]  clotrimazole-betamethasone (LOTRISONE) cream Apply topically. 09/03/23   [provider]  loperamide  (IMODIUM ) 2 MG capsule Take 1 capsule (2 mg total) by mouth as needed for diarrhea or loose stools. 08/06/23   Hope Merle, MD  mirtazapine  (REMERON ) 7.5 MG tablet Take 1 tablet (7.5 mg total) by mouth at bedtime. 11/28/23   Tullo, Teresa L, MD  Multiple Vitamin (MULTIVITAMIN WITH MINERALS) TABS tablet Take 1 tablet by mouth 2 (two) times daily.    [provider]  mupirocin ointment (BACTROBAN) 2 % SMARTSIG:1 Application Topical 2-3 Times Daily 09/09/23   [provider]  OVER THE COUNTER MEDICATION Take 20,000 mcg by mouth daily. Vitamin B12-methylfolate    [provider]  sertraline  (ZOLOFT ) 50 MG tablet Take 50 mg by mouth daily.    [provider]  tadalafil  (CIALIS ) 20 MG tablet TAKE ONE TABLET DAILY AS NEEDED FOR ERECTILE DYSFUNCTION 11/26/23   Marylynn Verneita CROME, MD  tamsulosin  (FLOMAX ) 0.4 MG CAPS capsule Take 2  capsules (0.8 mg total) by mouth daily. 08/07/23   Hope Merle, MD  Testosterone  75  MG PLLT by Implant route.    [provider]  thyroid  (NP THYROID ) 60 MG tablet Take 1 tablet (60 mg total) by mouth daily. 10/28/23   Marylynn Verneita CROME, MD  Zinc  Oxide 10 % OINT Apply twice daily to sacral decubitus ulcer 11/28/23   Marylynn Verneita CROME, MD    Family History  Problem Relation Age of Onset   Alcohol abuse Mother    Diabetes Mother    Mental retardation Mother    Arthritis Father    Hearing loss Father    Alcohol abuse Father      Social History   Tobacco Use   Smoking status: Former    Current packs/day: 0.00    Average packs/day: 1 pack/day for 15.0 years (15.0 ttl pk-yrs)    Types: Cigarettes    Start date: 06/14/1968    Quit date: 06/15/1983    Years since quitting: 40.5   Smokeless tobacco: Current    Types: Chew  Vaping Use   Vaping status: Never Used  Substance Use Topics   Alcohol use: Yes    Alcohol/week: 10.0 standard drinks of alcohol    Types: 10 Glasses of wine per week    Comment: pt reports 3 glasses per day   Drug use: No    Allergies as of 12/26/2023   (No Known Allergies)    Review of Systems:    All systems reviewed and negative except where noted in HPI.   Physical Exam:  Vital signs in last 24 hours: Temp:  [98 F (36.7 C)] 98 F (36.7 C) (07/31 1039) Pulse Rate:  [100] 100 (07/31 1039) Resp:  [18] 18 (07/31 1039) BP: (127)/(67) 127/67 (07/31 1039) SpO2:  [96 %] 96 % (07/31 1039) Weight:  [70.3 kg] 70.3 kg (07/31 1031)   General:   Pleasant, cooperative in NAD Head:  Normocephalic and atraumatic. Eyes:   Positive icterus.   Conjunctiva yellow. PERRLA. Ears:  Normal auditory acuity. Rectal:  Not performed. Msk:  Symmetrical without gross deformities.    Extremities:  Without edema, cyanosis or clubbing. Neurologic:  Alert and oriented x3;  grossly normal neurologically. Skin:  Intact without significant lesions or rashes. Psych:  Alert  and cooperative. Normal affect.  LAB RESULTS: Recent Labs    12/26/23 1039  WBC 6.5  HGB 8.0*  HCT 24.0*  PLT 131*   BMET Recent Labs    12/26/23 1039  NA 137  K 3.3*  CL 106  CO2 23  GLUCOSE 114*  BUN 39*  CREATININE 0.79  CALCIUM 9.0   LFT Recent Labs    12/26/23 1039  PROT 4.8*  ALBUMIN 1.9*  AST 153*  ALT 88*  ALKPHOS 162*  BILITOT 6.1*   PT/INR Recent Labs    12/26/23 1142  LABPROT 17.4*  INR 1.3*    STUDIES: No results found.    Impression / Plan:   Assessment: Principal Problem:   Melena Active Problems:   Thrombocytopenia (HCC)   Benign prostatic hyperplasia with urinary obstruction   Transaminitis   Alcohol use disorder   ABLA (acute blood loss anemia)   Macrocytic anemia   Coronary artery calcification   Aortic atherosclerosis (HCC)   Protein-calorie malnutrition, severe (HCC)   Hyperbilirubinemia   Coagulopathy (HCC)   UGI bleed   Tony Luna is a 83 y.o. y/o male with melanotic stools and a slight drop in his hemoglobin with a macrocytic anemia and a history of cirrhosis on imaging.  The patient also states  there is some blood with his bowel movements.  He has had no abdominal pain nausea or vomiting fevers or chills at the present time.  Plan:  The patient will be set up for a upper endoscopy to rule out esophageal varices or peptic ulcer disease as a cause of his bleeding.  The patient and his family have been explained the plan and agree with it.  He will be brought up to the endoscopy unit as soon as possible for the procedure.  Thank you for involving me in the care of this patient.      LOS: 0 days   Rogelia Copping, MD, MD. NOLIA 12/26/2023, 1:17 PM,  Pager [phone] 7am-5pm  Check AMION for 5pm -7am coverage and on weekends   Note: This dictation was prepared with Dragon dictation along with smaller phrase technology. Any transcriptional errors that result from this process are unintentional.

## 2023-12-26 NOTE — H&P (Addendum)
 History and Physical    Patient: Tony Luna FMW:[id number] DOB: 04-21-1941 DOA: 12/26/2023 DOS: the patient was seen and examined on 12/26/2023 PCP: Marylynn Verneita CROME, MD  Patient coming from: Home  Chief Complaint:  Chief Complaint  Patient presents with   Rectal Bleeding   HPI: Tony Luna is a 83 y.o. male with medical history significant of seasonal allergies, arthritis, unspecified arrhythmia, coronary artery calcification, aortic atherosclerosis, hypothyroidism, rotator cuff tear, sleep apnea, EtOH abuse, alcoholic cirrhosis, history of esophageal who was discharged 4 days ago after being admitted for acute respiratory failure with hypoxia in the setting of community-acquired pneumonia and elevated troponin who is coming to the emergency department due to episodes of melanotic stools over the past 3-4 weeks.  He has mild postural lightheadedness. He has been sleeping more than usual and reports feeling more fatigue.  No abdominal pain, nausea, emesis, diarrhea or constipation. No flank pain, dysuria, frequency or hematuria.He denied fever, chills, rhinorrhea, sore throat, wheezing or hemoptysis. No chest pain, palpitations, diaphoresis, PND, orthopnea or pitting edema of the lower extremities. No polyuria, polydipsia, polyphagia or blurred vision.   Lab work: CBC showed a white count of 6.5, hemoglobin 8.0 g/dL (8.8 g/dL 4 days ago and 87.7 g/dL 13 days ago), MCV 888.8 fL and platelets 131.  B12 level and folate level were normal 3 months ago.  PT 17.4 and INR 1.3.  CMP showed 737, potassium 3.3, chloride 106 and CO2 23 mmol/L with a normal anion gap.  Glucose 114, BUN 39, creatinine 0.79 and calcium 9.0 (corrected to albumin level 10.7 mg/dL).  Total protein 4.8 and albumin 1.9 g/dL.  AST 853, ALT 888, alkaline phosphatase 162 units/L and total bilirubin 6.1 mg/dL.   ED course: Initial vital signs were temperature 98 F, pulse 100, respiration 18, BP 127/67 mmHg O2 sat 96% on room air.  The  patient received ceftriaxone  1 g IVPB, pantoprazole  80 mg IVP and vancomycin  1500 mg IVPB.  Review of Systems: As mentioned in the history of present illness. All other systems reviewed and are negative. Past Medical History:  Diagnosis Date   Allergy    Arthritis    Cirrhosis (HCC)    Complication of anesthesia    :need catheter   Dysrhythmia    ? something told by reg dr  unable to say what it was   Esophageal tear    Hypothyroidism    Pneumonia    hx   Primary osteoarthritis of left knee 06/22/2014   Rotator cuff tear    Sleep apnea corrected- dental device   Past Surgical History:  Procedure Laterality Date   BACK SURGERY     blepharaplasty     CATARACT EXTRACTION W/PHACO Left 02/21/2021   Procedure: CATARACT EXTRACTION PHACO AND INTRAOCULAR LENS PLACEMENT (IOC) LEFT 4.56 00:34.9;  Surgeon: Jaye Fallow, MD;  Location: [facility];  Service: Ophthalmology;  Laterality: Left;   CAUTERY OF TURBINATES     COLONOSCOPY WITH PROPOFOL  N/A 07/19/2015   Procedure: COLONOSCOPY WITH PROPOFOL ;  Surgeon: Rogelia Copping, MD;  Location: [facility] ENDOSCOPY;  Service: Endoscopy;  Laterality: N/A;   COLONOSCOPY WITH PROPOFOL  N/A 03/21/2021   Procedure: COLONOSCOPY WITH PROPOFOL ;  Surgeon: Copping Rogelia, MD;  Location: [facility] ENDOSCOPY;  Service: Endoscopy;  Laterality: N/A;   COLONOSCOPY WITH PROPOFOL  N/A 04/18/2021   Procedure: COLONOSCOPY WITH PROPOFOL ;  Surgeon: Copping Rogelia, MD;  Location: [facility] ENDOSCOPY;  Service: Endoscopy;  Laterality: N/A;   COLONOSCOPY WITH PROPOFOL  N/A 04/24/2022   Procedure:  COLONOSCOPY WITH PROPOFOL ;  Surgeon: Jinny Carmine, MD;  Location: [facility] ENDOSCOPY;  Service: Endoscopy;  Laterality: N/A;   EYE SURGERY     HERNIA REPAIR Left 20+ years ago   inguinal herniorrhapy   JOINT REPLACEMENT  left knee 2016   KNEE SURGERY     ? loose body/ chondral defect 70yrs   ROTATOR CUFF REPAIR     left   SPINE SURGERY  10-12 years ago   TONSILLECTOMY     TOTAL KNEE  ARTHROPLASTY Left 06/22/2014   Procedure: TOTAL KNEE ARTHROPLASTY;  Surgeon: Fonda SHAUNNA Olmsted, MD;  Location: [facility] OR;  Service: Orthopedics;  Laterality: Left;   Social History:  reports that he quit smoking about 40 years ago. His smoking use included cigarettes. He started smoking about 55 years ago. He has a 15 pack-year smoking history. His smokeless tobacco use includes chew. He reports current alcohol use of about 10.0 standard drinks of alcohol per week. He reports that he does not use drugs.  No Known Allergies  Family History  Problem Relation Age of Onset   Alcohol abuse Mother    Diabetes Mother    Mental retardation Mother    Arthritis Father    Hearing loss Father    Alcohol abuse Father     Prior to Admission medications   Medication Sig Start Date End Date Taking? Authorizing Provider  amoxicillin -clavulanate (AUGMENTIN ) 875-125 MG tablet Take 1 tablet by mouth 2 (two) times daily for 4 days. 12/23/23 12/27/23  Marylynn Verneita CROME, MD  azithromycin  (ZITHROMAX  Z-PAK) 250 MG tablet Take 1 tablet (250 mg total) by mouth daily for 4 days. 12/23/23 12/27/23  Marylynn Verneita CROME, MD  Boron 6 MG TABS Take by mouth daily.    [provider]  clotrimazole-betamethasone (LOTRISONE) cream Apply topically. 09/03/23   [provider]  loperamide  (IMODIUM ) 2 MG capsule Take 1 capsule (2 mg total) by mouth as needed for diarrhea or loose stools. 08/06/23   Hope Merle, MD  mirtazapine  (REMERON ) 7.5 MG tablet Take 1 tablet (7.5 mg total) by mouth at bedtime. 11/28/23   Tullo, Teresa L, MD  Multiple Vitamin (MULTIVITAMIN WITH MINERALS) TABS tablet Take 1 tablet by mouth 2 (two) times daily.    [provider]  mupirocin ointment (BACTROBAN) 2 % SMARTSIG:1 Application Topical 2-3 Times Daily 09/09/23   [provider]  OVER THE COUNTER MEDICATION Take 20,000 mcg by mouth daily. Vitamin B12-methylfolate    [provider]  sertraline  (ZOLOFT ) 50 MG tablet Take 50 mg by  mouth daily.    [provider]  tadalafil  (CIALIS ) 20 MG tablet TAKE ONE TABLET DAILY AS NEEDED FOR ERECTILE DYSFUNCTION 11/26/23   Marylynn Verneita CROME, MD  tamsulosin  (FLOMAX ) 0.4 MG CAPS capsule Take 2 capsules (0.8 mg total) by mouth daily. 08/07/23   Hope Merle, MD  Testosterone  75 MG PLLT by Implant route.    [provider]  thyroid  (NP THYROID ) 60 MG tablet Take 1 tablet (60 mg total) by mouth daily. 10/28/23   Marylynn Verneita CROME, MD  Zinc  Oxide 10 % OINT Apply twice daily to sacral decubitus ulcer 11/28/23   Marylynn Verneita CROME, MD    Physical Exam: Vitals:   12/26/23 1031 12/26/23 1039  BP:  127/67  Pulse:  100  Resp:  18  Temp:  98 F (36.7 C)  TempSrc:  Oral  SpO2:  96%  Weight: 70.3 kg   Height: 6' (1.829 m)    Physical Exam Constitutional:  General: He is awake. He is not in acute distress.    Appearance: He is normal weight. He is ill-appearing.  HENT:     Head: Normocephalic.     Nose: No rhinorrhea.     Mouth/Throat:     Mouth: Mucous membranes are moist.  Eyes:     General: Scleral icterus present.     Pupils: Pupils are equal, round, and reactive to light.  Neck:     Vascular: No JVD.  Cardiovascular:     Rate and Rhythm: Normal rate and regular rhythm.     Heart sounds: S1 normal and S2 normal. Murmur heard.  Pulmonary:     Effort: Pulmonary effort is normal.     Breath sounds: Normal breath sounds.  Abdominal:     General: Bowel sounds are normal. There is no distension.     Palpations: Abdomen is soft.     Tenderness: There is no abdominal tenderness. There is no right CVA tenderness or left CVA tenderness.  Musculoskeletal:     Cervical back: Neck supple.     Right lower leg: No edema.     Left lower leg: No edema.  Skin:    General: Skin is warm and dry.     Coloration: Skin is jaundiced.     Findings: Bruising and erythema present.  Neurological:     General: No focal deficit present.     Mental Status: He is alert and oriented to  person, place, and time.  Psychiatric:        Mood and Affect: Mood normal.        Behavior: Behavior normal. Behavior is cooperative.     Data Reviewed:  Results are pending, will review when available. IMPRESSIONS    1. Left ventricular ejection fraction, by estimation, is 55 to 60%. The left ventricle has normal function. The left ventricle has no regional wall motion abnormalities. Left ventricular diastolic parameters are consistent with Grade I diastolic dysfunction (impaired relaxation). The average left ventricular global longitudinal strain is -16.8 %.  2. Right ventricular systolic function is normal. The right ventricular size is normal. There is normal pulmonary artery systolic pressure. The estimated right ventricular systolic pressure is 31.4 mmHg.  3. Left atrial size was mildly dilated.  4. The mitral valve is normal in structure. No evidence of mitral valve regurgitation. No evidence of mitral stenosis.  5. The aortic valve is normal in structure. There is moderate calcification of the aortic valve. Aortic valve regurgitation is mild. Mild aortic valve stenosis. Aortic valve mean gradient measures 9.5 mmHg.  6. The inferior vena cava is normal in size with greater than 50% respiratory variability, suggesting right atrial pressure of 3 mmHg.  EKG: 12/20/2023 Vent. rate 110 BPM PR interval 142 ms QRS duration 74 ms QT/QTcB 364/492 ms P-R-T axes 53 -7 53 Sinus tachycardia Minimal voltage criteria for LVH, may be normal variant ( R in aVL ) Cannot rule out Anterior infarct , age undetermined Abnormal ECG  Assessment and Plan: Principal Problem:   Melena Secondary to:   UGI bleed In the setting of:  Alcoholic cirrhosis Resulting in:   ABLA (acute blood loss anemia)   Macrocytic anemia Admit to telemetry/inpatient. Keep NPO for now. Undergoing EGD this p.m. Continue IV fluids. Continue pantoprazole  twice a day. Monitor H&H. Transfuse if hemoglobin  drops below 8.0 g/dL. Had normal B12 and folate recently. GI consult appreciated. - Will follow the recommendations.  Active Problems:   Alcohol use disorder Has  ceased alcohol for the past week. No withdrawal symptoms. Magnesium  sulfate 2 g IVPB x 1.    Alcoholic cirrhosis (HCC) Associated with:   Hyperbilirubinemia,   Transaminitis And   Coagulopathy (HCC) Function numbers seems to be improving. Follow-up CMP with morning labs. Follow-up PT/INR adequate.    Thrombocytopenia (HCC) In the setting of cirrhosis. Monitor platelet count.    Pressure injury of sacral region, stage 1  Continue ceftriaxone  1 g IVPB. Continue prophylactic pressure relieving measures.    Hypokalemia  Replacing. Supplemented with magnesium  sulfate 2 g IVPB.    Coronary artery calcification   Aortic atherosclerosis (HCC) No angina, palpitations or diaphoresis. No recent complaints of lower extremity edema.    Aortic regurgitation    Aortic stenosis  Follow-up with cardiology as needed.    Benign prostatic hyperplasia with urinary obstruction Continue tamsulosin  0.4 mg p.o. daily.    Protein-calorie malnutrition, severe (HCC) In the setting of anemia and cirrhosis. May benefit from protein supplementation. Consider nutritional services evaluation. Follow-up albumin level. Continue alcohol cessation.     Advance Care Planning:   Code Status: Limited: Do not attempt resuscitation (DNR) -DNR-LIMITED -Do Not Intubate/DNI    Consults: Gastroenterology (Dr. Rogelia Copping)  Family Communication:   Severity of Illness: The appropriate patient status for this patient is OBSERVATION. Observation status is judged to be reasonable and necessary in order to provide the required intensity of service to ensure the patient's safety. The patient's presenting symptoms, physical exam findings, and initial radiographic and laboratory data in the context of their medical condition is felt to place them at  decreased risk for further clinical deterioration. Furthermore, it is anticipated that the patient will be medically stable for discharge from the hospital within 2 midnights of admission.   Author: Alm Dorn Castor, MD 12/26/2023 12:24 PM  For on call review [email or web address].   This document was prepared using Dragon voice recognition software and may contain some unintended transcription errors.

## 2023-12-26 NOTE — Transfer of Care (Signed)
 Immediate Anesthesia Transfer of Care Note  Patient: Tony Luna  Procedure(s) Performed: EGD (ESOPHAGOGASTRODUODENOSCOPY)  Patient Location: PACU  Anesthesia Type:General  Level of Consciousness: awake, alert , and oriented  Airway & Oxygen Therapy: Patient Spontanous Breathing and Patient connected to face mask oxygen  Post-op Assessment: Report given to RN and Post -op Vital signs reviewed and stable  Post vital signs: stable, given labetalol  and bp and HR stable.  Last Vitals:  Vitals Value Taken Time  BP 115/68 12/26/23 14:42  Temp    Pulse 91 12/26/23 14:43  Resp 17 12/26/23 14:43  SpO2 99 % 12/26/23 14:43  Vitals shown include unfiled device data.  Last Pain:  Vitals:   12/26/23 1351  TempSrc: Temporal  PainSc: 0-No pain         Complications: No notable events documented.

## 2023-12-26 NOTE — ED Provider Notes (Signed)
 [name] Provider Note    Event Date/Time   First MD Initiated Contact with Patient 12/26/23 1140     (approximate)   History   Rectal Bleeding   HPI  Tony Luna is a 83 y.o. male with history of cirrhosis, no prior EGD, unknown if he has varices, presenting with melanotic stool.  Patient denies any chest pain or shortness of breath, no abdominal pain or lightheadedness, no fever.  Per independent history from daughter, CNA had called saying that they noticed blood on the floor, and that he has been having dark stools.  He is not on any blood thinners.  Does state that he has sores to his gluteal region that they have been treating with antibiotics, thinks the redness around the sores are getting worse.    On independent review, he was admitted in late July for hypoxic respiratory failure secondary to pneumonia, also with history of cirrhosis, no prior EGD in the chart.  Physical Exam   Triage Vital Signs: ED Triage Vitals  Encounter Vitals Group     BP 12/26/23 1039 127/67     Girls Systolic BP Percentile --      Girls Diastolic BP Percentile --      Boys Systolic BP Percentile --      Boys Diastolic BP Percentile --      Pulse Rate 12/26/23 1039 100     Resp 12/26/23 1039 18     Temp 12/26/23 1039 98 F (36.7 C)     Temp Source 12/26/23 1039 Oral     SpO2 12/26/23 1039 96 %     Weight 12/26/23 1031 155 lb (70.3 kg)     Height 12/26/23 1031 6' (1.829 m)     Head Circumference --      Peak Flow --      Pain Score 12/26/23 1031 0     Pain Loc --      Pain Education --      Exclude from Growth Chart --     Most recent vital signs: Vitals:   12/26/23 1444 12/26/23 1454  BP: 121/69 127/82  Pulse: 91 88  Resp: 17 20  Temp:    SpO2: 99% 94%     General: Awake, no distress.  CV:  Good peripheral perfusion.  Resp:  Normal effort.  Abd:  No distention.  Abdomen soft nontender Other:  Rectal exam was done with a chaperone,  melanotic stool in rectal vault that is Hemoccult positive, he does also have some sores to his bilateral gluteal region with surrounding erythema, no purulent drainage.   ED Results / Procedures / Treatments   Labs (all labs ordered are listed, but only abnormal results are displayed) Labs Reviewed  COMPREHENSIVE METABOLIC PANEL WITH GFR - Abnormal; Notable for the following components:      Result Value   Potassium 3.3 (*)    Glucose, Bld 114 (*)    BUN 39 (*)    Total Protein 4.8 (*)    Albumin 1.9 (*)    AST 153 (*)    ALT 88 (*)    Alkaline Phosphatase 162 (*)    Total Bilirubin 6.1 (*)    All other components within normal limits  CBC - Abnormal; Notable for the following components:   RBC 2.16 (*)    Hemoglobin 8.0 (*)    HCT 24.0 (*)    MCV 111.1 (*)    MCH 37.0 (*)  Platelets 131 (*)    All other components within normal limits  PROTIME-INR - Abnormal; Notable for the following components:   Prothrombin Time 17.4 (*)    INR 1.3 (*)    All other components within normal limits  HEMOGLOBIN AND HEMATOCRIT, BLOOD  HEMOGLOBIN AND HEMATOCRIT, BLOOD  MAGNESIUM   PHOSPHORUS  POC OCCULT BLOOD, ED  TYPE AND SCREEN      PROCEDURES:  Critical Care performed: No  Procedures   MEDICATIONS ORDERED IN ED: Medications  ondansetron  (ZOFRAN ) tablet 4 mg ( Oral MAR Unhold 12/26/23 1513)    Or  ondansetron  (ZOFRAN ) injection 4 mg ( Intravenous MAR Unhold 12/26/23 1513)  pantoprazole  (PROTONIX ) injection 40 mg ( Intravenous MAR Unhold 12/26/23 1513)  0.9 % NaCl with KCl 40 mEq / L  infusion (0 mL/hr Intravenous Stopped 12/26/23 1405)  vancomycin  (VANCOREADY) IVPB 1500 mg/300 mL (1,500 mg Intravenous New Bag/Given 12/26/23 1336)  cefTRIAXone  (ROCEPHIN ) 1 g in sodium chloride  0.9 % 100 mL IVPB (has no administration in time range)  pantoprazole  (PROTONIX ) injection 80 mg (80 mg Intravenous Given 12/26/23 1220)  cefTRIAXone  (ROCEPHIN ) 1 g in sodium chloride  0.9 % 100 mL IVPB (0  g Intravenous Stopped 12/26/23 1304)     IMPRESSION / MDM / ASSESSMENT AND PLAN / ED COURSE  I reviewed the triage vital signs and the nursing notes.                              Differential diagnosis includes, but is not limited to, GI bleed, suspect upper GI, he has no reported history of varices but has not had a prior EGD, also considered GERD, peptic ulcer disease, gastritis, anemia.  For the sores with surrounding erythema, considered cellulitis, will start him on IV antibiotics, labs, IV Protonix .  He will need to be admitted for further management and GI evaluation.  Patient's presentation is most consistent with acute presentation with potential threat to life or bodily function.  Independent interpretation of labs and imaging below.  Consult hospitalist who is agreeable to plan for admission will evaluate the patient.  He is admitted.  The patient is on the cardiac monitor to evaluate for evidence of arrhythmia and/or significant heart rate changes.   Clinical Course as of 12/26/23 1517  Thu Dec 26, 2023  1210 Independent review of labs, electrolytes not severely deranged, his LFTs are elevated but they have been elevated in the past, consistent with his history of cirrhosis, his hemoglobin is 8 which is down by 0.8 points compared to 4 days ago. [TT]    Clinical Course User Index [TT] [place], Lorelle Cummins, MD     FINAL CLINICAL IMPRESSION(S) / ED DIAGNOSES   Final diagnoses:  Melena  Cellulitis, unspecified cellulitis site     Rx / DC Orders   ED Discharge Orders     None        Note:  This document was prepared using Dragon voice recognition software and may include unintentional dictation errors.    [place] Lorelle Cummins, MD 12/26/23 [phone]

## 2023-12-26 NOTE — ED Triage Notes (Addendum)
 Pt via [facility] from [facility]. Pt c/o dark tarry stool since last night. Denies any constipation or diarrhea. Recently in the hospital for cirrhosis. Denies blood thinner.Pt is A&OX4 and NAD  EMS reports 100/70 BP, 119.66 was most recent, 97% on RA, 98 HR, 97.3 orally, 22 RR, 26 ETCO2, 126 CBG. EMS gave bolus

## 2023-12-26 NOTE — Op Note (Signed)
 [facility] Gastroenterology Patient Name: Tony Luna Procedure Date: 12/26/2023 1:15 PM MRN: [id number] Account #: [id number] Date of Birth: 07-Jan-1941 Admit Type: Inpatient Age: 83 Room: [facility] ENDO ROOM 1 Gender: Male Note Status: Finalized Instrument Name: [id number] [id number] Procedure:             Upper GI endoscopy Indications:           Melena Providers:             Rogelia Copping MD, MD Medicines:             General Anesthesia Complications:         No immediate complications. Procedure:             Pre-Anesthesia Assessment:                        - Prior to the procedure, a History and Physical was                         performed, and patient medications and allergies were                         reviewed. The patient's tolerance of previous                         anesthesia was also reviewed. The risks and benefits                         of the procedure and the sedation options and risks                         were discussed with the patient. All questions were                         answered, and informed consent was obtained. Prior                         Anticoagulants: The patient has taken no anticoagulant                         or antiplatelet agents. ASA Grade Assessment: III - A                         patient with severe systemic disease. After reviewing                         the risks and benefits, the patient was deemed in                         satisfactory condition to undergo the procedure.                        After obtaining informed consent, the endoscope was                         passed under direct vision. Throughout the procedure,                         the patient's blood pressure, pulse, and  oxygen                         saturations were monitored continuously. The Endoscope                         was introduced through the mouth, and advanced to the                         second part of duodenum. The upper GI  endoscopy was                         accomplished without difficulty. The patient tolerated                         the procedure well. Findings:      The examined esophagus was normal.      Moderate portal hypertensive gastropathy was found in the entire       examined stomach.      Many oozing cratered duodenal ulcers with a visible vessel were found in       the duodenal bulb. Area was successfully injected with 3 mL of a 0.1       mg/mL solution of epinephrine  for hemostasis. Coagulation for hemostasis       using bipolar probe was successful. Impression:            - Normal esophagus.                        - Portal hypertensive gastropathy.                        - Oozing duodenal ulcers with a visible vessel.                         Injected. Treated with bipolar cautery.                        - No specimens collected. Recommendation:        - Return patient to hospital ward for ongoing care.                        - Clear liquid diet.                        - Continue present medications.                        - If any further bleeding then need to consult                         vascular surgery for embolization                        - Use a proton pump inhibitor PO BID. 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 All rights reserved. The codes documented in this report are preliminary and upon coder review may  be revised to meet current compliance requirements. Rogelia Copping MD, MD 12/26/2023 2:31:15 PM This report has been signed electronically. Number of Addenda: 0 Note  Initiated On: 12/26/2023 1:15 PM Estimated Blood Loss:  Estimated blood loss: none. Estimated blood loss was                         minimal.      [facility]

## 2023-12-26 NOTE — Progress Notes (Signed)
  Progress Note   Patient: Tony Luna FMW:[id number] DOB: [birth year] DOA: 12/26/2023     0  The nursing staff communicated to me that the patient is on 2 LPM via nasal cannula after having hypoxia of 88%.  He was recently admitted for community-acquired pneumonia.  Will obtain a portable chest radiograph to further evaluate.  Portable chest x-ray still shows bilateral pneumonia which was treated with ceftriaxone  and azithromycin  originally.  He is back on ceftriaxone  for cellulitis.  He just underwent EGD this afternoon.  No fever, WBC is normal and clinically better.  Will continue oxygen and albuterol nebs for now.  If the patient develops fever, worsening hypoxia or leukocytosis will escalate antibiotic therapy.  Author: Alm Dorn Castor, MD 12/26/2023 3:48 PM

## 2023-12-26 NOTE — Anesthesia Preprocedure Evaluation (Addendum)
 Anesthesia Evaluation  Patient identified by MRN, date of birth, ID band Patient awake    Reviewed: Allergy & Precautions, NPO status , Patient's Chart, lab work & pertinent test results  Airway Mallampati: II  TM Distance: >3 FB Neck ROM: full    Dental  (+) Poor Dentition   Pulmonary asthma , sleep apnea , former smoker   Pulmonary exam normal breath sounds clear to auscultation       Cardiovascular Exercise Tolerance: Poor hypertension, + CAD  negative cardio ROS Normal cardiovascular exam Rhythm:Regular Rate:Normal  Aortic valve stenosis   Neuro/Psych negative neurological ROS  negative psych ROS   GI/Hepatic negative GI ROS, Neg liver ROS,,,(+) Cirrhosis     substance abuse  alcohol use  Endo/Other  negative endocrine ROSHypothyroidism    Renal/GU      Musculoskeletal   Abdominal   Peds  Hematology negative hematology ROS (+) Blood dyscrasia, anemia   Anesthesia Other Findings Past Medical History: No date: Allergy No date: Arthritis No date: Cirrhosis (HCC) No date: Complication of anesthesia     Comment:  :need catheter No date: Dysrhythmia     Comment:  ? something told by reg dr  unable to say what it was No date: Esophageal tear No date: Hypothyroidism No date: Pneumonia     Comment:  hx 06/22/2014: Primary osteoarthritis of left knee No date: Rotator cuff tear corrected- dental device: Sleep apnea  Past Surgical History: No date: BACK SURGERY No date: blepharaplasty 02/21/2021: CATARACT EXTRACTION W/PHACO; Left     Comment:  Procedure: CATARACT EXTRACTION PHACO AND INTRAOCULAR               LENS PLACEMENT (IOC) LEFT 4.56 00:34.9;  Surgeon:               Jaye Fallow, MD;  Location: [facility];                Service: Ophthalmology;  Laterality: Left; No date: CAUTERY OF TURBINATES 07/19/2015: COLONOSCOPY WITH PROPOFOL ; N/A     Comment:  Procedure: COLONOSCOPY WITH  PROPOFOL ;  Surgeon: Rogelia Copping, MD;  Location: [facility] END[facility]COPY;  Service: Endoscopy;              Laterality: N/A; 03/21/2021: COLONOSCOPY WITH PROPOFOL ; N/A     Comment:  Procedure: COLONOSCOPY WITH PROPOFOL ;  Surgeon: Copping Rogelia, MD;  Location: [facility] END[facility];  Service:               Endoscopy;  Laterality: N/A; 04/18/2021: COLONOSCOPY WITH PROPOFOL ; N/A     Comment:  Procedure: COLONOSCOPY WITH PROPOFOL ;  Surgeon: Copping Rogelia, MD;  Location: [facility] ENDOSCOPY;  Service:               Endoscopy;  Laterality: N/A; 04/24/2022: COLONOSCOPY WITH PROPOFOL ; N/A     Comment:  Procedure: COLONOSCOPY WITH PROPOFOL ;  Surgeon: Copping Rogelia, MD;  Location: [facility] ENDOSCOPY;  Service:               Endoscopy;  Laterality: N/A; No date: EYE SURGERY 20+ years ago: HERNIA REPAIR; Left     Comment:  inguinal herniorrhapy left knee 2016: JOINT REPLACEMENT No date: KNEE SURGERY  Comment:  ? loose body/ chondral defect 22yrs No date: ROTATOR CUFF REPAIR     Comment:  left 10-12 years ago: SPINE SURGERY No date: TONSILLECTOMY 06/22/2014: TOTAL KNEE ARTHROPLASTY; Left     Comment:  Procedure: TOTAL KNEE ARTHROPLASTY;  Surgeon: Fonda SHAUNNA Olmsted, MD;  Location: [facility] OR;  Service: Orthopedics;                Laterality: Left;  BMI    Body Mass Index: 21.02 kg/m      Reproductive/Obstetrics negative OB ROS                              Anesthesia Physical Anesthesia Plan  ASA: 3  Anesthesia Plan: General   Post-op Pain Management:    Induction: Intravenous  PONV Risk Score and Plan: Ondansetron , Dexamethasone , Midazolam  and Treatment may vary due to age or medical condition  Airway Management Planned: Oral ETT  Additional Equipment:   Intra-op Plan:   Post-operative Plan: Extubation in OR  Informed Consent: I have reviewed the patients History and Physical, chart, labs and discussed the  procedure including the risks, benefits and alternatives for the proposed anesthesia with the patient or authorized representative who has indicated his/her understanding and acceptance.     Dental Advisory Given  Plan Discussed with: CRNA and Surgeon  Anesthesia Plan Comments:          Anesthesia Quick Evaluation

## 2023-12-26 NOTE — Progress Notes (Signed)
 ED Pharmacy Antibiotic Sign Off An antibiotic consult was received from an ED provider for vancomcyin per pharmacy dosing. A chart review was completed to assess appropriateness.   The following one time order(s) were placed:  Vancomycin  1500mg   Further antibiotic and/or antibiotic pharmacy consults should be ordered by the admitting provider if indicated.   Thank you for allowing pharmacy to be a part of this patient's care.   Estill CHRISTELLA Lutes, PharmD, BCPS Clinical Pharmacist 12/26/2023 12:47 PM

## 2023-12-27 ENCOUNTER — Encounter
Admission: EM | Disposition: A | Discharge status: Skilled Nursing Facility | Payer: Self-pay | Source: Home / Self Care | Attending: Student

## 2023-12-27 DIAGNOSIS — I352 Nonrheumatic aortic (valve) stenosis with insufficiency: Secondary | ICD-10-CM | POA: Diagnosis not present

## 2023-12-27 DIAGNOSIS — I2510 Atherosclerotic heart disease of native coronary artery without angina pectoris: Secondary | ICD-10-CM | POA: Diagnosis not present

## 2023-12-27 DIAGNOSIS — N401 Enlarged prostate with lower urinary tract symptoms: Secondary | ICD-10-CM | POA: Diagnosis not present

## 2023-12-27 DIAGNOSIS — K922 Gastrointestinal hemorrhage, unspecified: Secondary | ICD-10-CM

## 2023-12-27 DIAGNOSIS — K269 Duodenal ulcer, unspecified as acute or chronic, without hemorrhage or perforation: Secondary | ICD-10-CM | POA: Diagnosis not present

## 2023-12-27 DIAGNOSIS — D689 Coagulation defect, unspecified: Secondary | ICD-10-CM | POA: Diagnosis not present

## 2023-12-27 DIAGNOSIS — E43 Unspecified severe protein-calorie malnutrition: Secondary | ICD-10-CM | POA: Diagnosis not present

## 2023-12-27 DIAGNOSIS — Z96652 Presence of left artificial knee joint: Secondary | ICD-10-CM | POA: Diagnosis not present

## 2023-12-27 DIAGNOSIS — L89313 Pressure ulcer of right buttock, stage 3: Secondary | ICD-10-CM | POA: Diagnosis not present

## 2023-12-27 DIAGNOSIS — E039 Hypothyroidism, unspecified: Secondary | ICD-10-CM | POA: Diagnosis not present

## 2023-12-27 DIAGNOSIS — K3189 Other diseases of stomach and duodenum: Secondary | ICD-10-CM | POA: Diagnosis not present

## 2023-12-27 DIAGNOSIS — D6959 Other secondary thrombocytopenia: Secondary | ICD-10-CM | POA: Diagnosis not present

## 2023-12-27 DIAGNOSIS — K766 Portal hypertension: Secondary | ICD-10-CM | POA: Diagnosis not present

## 2023-12-27 DIAGNOSIS — L89323 Pressure ulcer of left buttock, stage 3: Secondary | ICD-10-CM | POA: Diagnosis not present

## 2023-12-27 DIAGNOSIS — Z9181 History of falling: Secondary | ICD-10-CM | POA: Diagnosis not present

## 2023-12-27 DIAGNOSIS — K264 Chronic or unspecified duodenal ulcer with hemorrhage: Secondary | ICD-10-CM | POA: Diagnosis not present

## 2023-12-27 DIAGNOSIS — R2681 Unsteadiness on feet: Secondary | ICD-10-CM | POA: Diagnosis not present

## 2023-12-27 DIAGNOSIS — K921 Melena: Secondary | ICD-10-CM | POA: Diagnosis not present

## 2023-12-27 DIAGNOSIS — L89151 Pressure ulcer of sacral region, stage 1: Secondary | ICD-10-CM | POA: Diagnosis not present

## 2023-12-27 DIAGNOSIS — Z66 Do not resuscitate: Secondary | ICD-10-CM | POA: Diagnosis not present

## 2023-12-27 DIAGNOSIS — N138 Other obstructive and reflux uropathy: Secondary | ICD-10-CM | POA: Diagnosis not present

## 2023-12-27 DIAGNOSIS — M6281 Muscle weakness (generalized): Secondary | ICD-10-CM | POA: Diagnosis not present

## 2023-12-27 DIAGNOSIS — Z87891 Personal history of nicotine dependence: Secondary | ICD-10-CM | POA: Diagnosis not present

## 2023-12-27 DIAGNOSIS — F1010 Alcohol abuse, uncomplicated: Secondary | ICD-10-CM | POA: Diagnosis not present

## 2023-12-27 DIAGNOSIS — R2689 Other abnormalities of gait and mobility: Secondary | ICD-10-CM | POA: Diagnosis not present

## 2023-12-27 DIAGNOSIS — D696 Thrombocytopenia, unspecified: Secondary | ICD-10-CM | POA: Diagnosis not present

## 2023-12-27 DIAGNOSIS — I700 Atherosclerosis of aorta: Secondary | ICD-10-CM | POA: Diagnosis not present

## 2023-12-27 DIAGNOSIS — Z833 Family history of diabetes mellitus: Secondary | ICD-10-CM | POA: Diagnosis not present

## 2023-12-27 DIAGNOSIS — D62 Acute posthemorrhagic anemia: Secondary | ICD-10-CM | POA: Diagnosis not present

## 2023-12-27 DIAGNOSIS — D539 Nutritional anemia, unspecified: Secondary | ICD-10-CM | POA: Diagnosis not present

## 2023-12-27 DIAGNOSIS — E876 Hypokalemia: Secondary | ICD-10-CM | POA: Diagnosis not present

## 2023-12-27 DIAGNOSIS — K7030 Alcoholic cirrhosis of liver without ascites: Secondary | ICD-10-CM | POA: Diagnosis not present

## 2023-12-27 LAB — COMPREHENSIVE METABOLIC PANEL WITH GFR
ALT: 89 U/L — ABNORMAL HIGH (ref 0–44)
AST: 148 U/L — ABNORMAL HIGH (ref 15–41)
Albumin: 1.8 g/dL — ABNORMAL LOW (ref 3.5–5.0)
Alkaline Phosphatase: 135 U/L — ABNORMAL HIGH (ref 38–126)
Anion gap: 7 (ref 5–15)
BUN: 29 mg/dL — ABNORMAL HIGH (ref 8–23)
CO2: 22 mmol/L (ref 22–32)
Calcium: 8 mg/dL — ABNORMAL LOW (ref 8.9–10.3)
Chloride: 111 mmol/L (ref 98–111)
Creatinine, Ser: 0.83 mg/dL (ref 0.61–1.24)
GFR, Estimated: >60 mL/min (ref >60)
Glucose, Bld: 90 mg/dL (ref 70–99)
Potassium: 3.5 mmol/L (ref 3.5–5.1)
Sodium: 140 mmol/L (ref 135–145)
Total Bilirubin: 5.1 mg/dL — ABNORMAL HIGH (ref 0.0–1.2)
Total Protein: 4.5 g/dL — ABNORMAL LOW (ref 6.5–8.1)

## 2023-12-27 LAB — CBC
HCT: 21.9 % — ABNORMAL LOW (ref 39.0–52.0)
Hemoglobin: 7.5 g/dL — ABNORMAL LOW (ref 13.0–17.0)
MCH: 37.7 pg — ABNORMAL HIGH (ref 26.0–34.0)
MCHC: 34.2 g/dL (ref 30.0–36.0)
MCV: 110.1 fL — ABNORMAL HIGH (ref 80.0–100.0)
Platelets: 126 K/uL — ABNORMAL LOW (ref 150–400)
RBC: 1.99 MIL/uL — ABNORMAL LOW (ref 4.22–5.81)
RDW: 13.6 % (ref 11.5–15.5)
WBC: 4.6 K/uL (ref 4.0–10.5)
nRBC: 0 % (ref 0.0–0.2)

## 2023-12-27 LAB — MISC LABCORP TEST (SEND OUT): Labcorp test code: 791584

## 2023-12-27 SURGERY — EMBOLIZATION
Anesthesia: Moderate Sedation

## 2023-12-27 NOTE — Progress Notes (Signed)
 Progress Note   Patient: Tony Luna FMW:[id number] DOB: 11-29-1940 DOA: 12/26/2023     0 DOS: the patient was seen and examined on 12/27/2023   Brief hospital course:  83 y.o. male with medical history significant of seasonal allergies, arthritis, unspecified arrhythmia, coronary artery calcification, aortic atherosclerosis, hypothyroidism, rotator cuff tear, sleep apnea, EtOH abuse, alcoholic cirrhosis, history of esophageal who was discharged 4 days ago after being admitted for acute respiratory failure with hypoxia in the setting of community-acquired pneumonia and elevated troponin who is coming to the emergency department due to episodes of melanotic stools over the past 3-4 weeks.  He has mild postural lightheadedness. He has been sleeping more than usual and reports feeling more fatigue.  No abdominal pain, nausea, emesis, diarrhea or constipation. No flank pain, dysuria, frequency or hematuria.He denied fever, chills, rhinorrhea, sore throat, wheezing or hemoptysis. No chest pain, palpitations, diaphoresis, PND, orthopnea or pitting edema of the lower extremities. No polyuria, polydipsia, polyphagia or blurred vision.    Lab work: CBC showed a white count of 6.5, hemoglobin 8.0 g/dL (8.8 g/dL 4 days ago and 87.7 g/dL 13 days ago), MCV 888.8 fL and platelets 131.  B12 level and folate level were normal 3 months ago.  PT 17.4 and INR 1.3.  CMP showed 737, potassium 3.3, chloride 106 and CO2 23 mmol/L with a normal anion gap.  Glucose 114, BUN 39, creatinine 0.79 and calcium 9.0 (corrected to albumin level 10.7 mg/dL).  Total protein 4.8 and albumin 1.9 g/dL.  AST 853, ALT 888, alkaline phosphatase 162 units/L and total bilirubin 6.1 mg/dL.   ED course: Initial vital signs were temperature 98 F, pulse 100, respiration 18, BP 127/67 mmHg O2 sat 96% on room air.  The patient received ceftriaxone  1 g IVPB, pantoprazole  80 mg IVP and vancomycin  1500 mg IVPB.  8/1 : Patient was seen by GI underwent  EGD was found to have a large duodenal ulcer with a visible vessel actively bleeding vessel was cauterized and injected with epinephrine .  Bleeding stopped.  Concern for possible  vessel bleed despite intervention Vascular surgery consulted for possible embolization if needed.     Assessment and Plan:  Melena /UGI bleed In the setting nq:Jornynopr cirrhosi Resulting pw:JAOJ (acute blood loss anemia) Macrocytic anemia  Admit to telemetry/inpatient. Diet advanced to clear liquids,  Continue IV fluids. Continue pantoprazole  twice a day. Monitor H&H. Transfuse if hemoglobin drops below 8.0 g/dL. Had normal B12 and folate recently. - Underwent EGD was found to have an ulcer.  Ulcer was cauterized.  Vascular surgery consulted for possible embolization if patient continues to bleed.    Alcohol use disorder Has ceased alcohol for the past week. No withdrawal symptoms. Magnesium  sulfate 2 g IVPB x 1.   Alcoholic cirrhosis (HCC) Associated with:   Hyperbilirubinemia,   Transaminitis And   Coagulopathy (HCC) Function numbers seems to be improving. Follow-up CMP with morning labs. Follow-up PT/INR adequate.   Thrombocytopenia  In the setting of cirrhosis. Monitor platelet count.   Pressure injury of sacral region, stage 1  Continue ceftriaxone  1 g IVPB. Continue prophylactic pressure relieving measures.   Hypokalemia  Replacing. Supplemented with magnesium  sulfate 2 g IVPB.   Coronary artery calcification Aortic atherosclerosis  No angina, palpitations or diaphoresis. No recent complaints of lower extremity edema.   Aortic regurgitation  Aortic stenosis  Follow-up with cardiology as needed.   Benign prostatic hyperplasia with urinary obstruction Continue tamsulosin  0.4 mg p.o. daily.   Protein-calorie malnutrition, severe  In the setting of anemia and cirrhosis. May benefit from protein supplementation. Consider nutritional services evaluation. Follow-up albumin  level. Continue alcohol cessation.    Advance Care Planning:   Code Status: Limited: Do not attempt resuscitation (DNR) -DNR-LIMITED -Do Not Intubate/DNI       Subjective: Patient seen and examined in the morning rounds. No overnight events. Stable h/h with no acute drop. Daughter updated about the status.   Physical Exam: Vitals:   12/27/23 0001 12/27/23 0330 12/27/23 0536 12/27/23 0848  BP: (!) 105/47 (!) 111/57 (!) 100/57 119/68  Pulse: 98 75 88 79  Resp: 20 16 18 19   Temp: 98.2 F (36.8 C) 98 F (36.7 C) 98.3 F (36.8 C) (!) 97.4 F (36.3 C)  TempSrc:      SpO2: 95% 97% 100% 96%  Weight:      Height:       Physical Exam HENT:     Head: Normocephalic.  Cardiovascular:     Rate and Rhythm: Normal rate and regular rhythm.     Heart sounds: No murmur heard. Abdominal:     General: There is no distension.     Palpations: Abdomen is soft. There is no mass.     Tenderness: There is no guarding or rebound.  Musculoskeletal:        General: Normal range of motion.     Cervical back: Normal range of motion.  Skin:    General: Skin is warm.  Neurological:     General: No focal deficit present.     Mental Status: He is alert and oriented to person, place, and time.     Data Reviewed:  There are no new results to review at this time.  Family Communication: daughter updated by bedside   Disposition: Status is: Observation The patient will require care spanning > 2 midnights and should be moved to inpatient because: GI bleeding   Planned Discharge Destination: Home    Time spent: 31 minutes  Author: Albina Sor, MD 12/27/2023 8:58 AM  For on call review [email or web address].

## 2023-12-27 NOTE — Consult Note (Signed)
 Hospital Consult    Reason for Consult:  GI Bleeding  Requesting Physician:  Dr Albina Sor MD  MRN #:  [id number]  History of Present Illness: This is a 83 y.o. male  who was discharged from the hospital recently.  Please see the consult note from 4 days ago on 7/26 by Dr. Onita. As per that note the patient has a history of alcohol abuse which the patient states he stopped drinking 6 days ago on Friday.  The patient has a history of compensated cirrhosis with hypothyroidism sleep apnea and was found to have bilateral pneumonia with respiratory failure.  The patient states he is doing much better and is now off of oxygen.  The patient had his wife and daughter at the bedside and reported that he has been having some black stool since he left the hospital and this morning he had black stools with some red blood in it.  Upon workup patient underwent upper endoscopy.  He is noted to have a large duodenal ulcer with a visible vessel actively bleeding in the duodenum with extensive cautery and injection of epinephrine  in the area had cessation of bleeding at the time.  However it appears that the patient's had a drop in hemoglobin overnight.  He was stable at 8.0 and 8.4 yesterday.  This morning he is down to 7.5.  GI recommends vascular surgery for embolization.  Vascular surgery consulted to evaluate.  Past Medical History:  Diagnosis Date   Allergy    Arthritis    Cirrhosis (HCC)    Complication of anesthesia    :need catheter   Dysrhythmia    ? something told by reg dr  unable to say what it was   Esophageal tear    Hypothyroidism    Pneumonia    hx   Primary osteoarthritis of left knee 06/22/2014   Rotator cuff tear    Sleep apnea corrected- dental device    Past Surgical History:  Procedure Laterality Date   BACK SURGERY     blepharaplasty     CATARACT EXTRACTION W/PHACO Left 02/21/2021   Procedure: CATARACT EXTRACTION PHACO AND INTRAOCULAR LENS PLACEMENT (IOC) LEFT 4.56  00:34.9;  Surgeon: Jaye Fallow, MD;  Location: [facility];  Service: Ophthalmology;  Laterality: Left;   CAUTERY OF TURBINATES     COLONOSCOPY WITH PROPOFOL  N/A 07/19/2015   Procedure: COLONOSCOPY WITH PROPOFOL ;  Surgeon: Rogelia Copping, MD;  Location: [facility];  Service: Endoscopy;  Laterality: N/A;   COLONOSCOPY WITH PROPOFOL  N/A 03/21/2021   Procedure: COLONOSCOPY WITH PROPOFOL ;  Surgeon: Copping Rogelia, MD;  Location: [facility];  Service: Endoscopy;  Laterality: N/A;   COLONOSCOPY WITH PROPOFOL  N/A 04/18/2021   Procedure: COLONOSCOPY WITH PROPOFOL ;  Surgeon: Copping Rogelia, MD;  Location: [facility] END[facility]COPY;  Service: Endoscopy;  Laterality: N/A;   COLONOSCOPY WITH PROPOFOL  N/A 04/24/2022   Procedure: COLONOSCOPY WITH PROPOFOL ;  Surgeon: Copping Rogelia, MD;  Location: [facility] END[facility];  Service: Endoscopy;  Laterality: N/A;   ESOPHAGOGASTRODUODENOSCOPY N/A 12/26/2023   Procedure: EGD (ESOPHAGOGASTRODUODENOSCOPY);  Surgeon: Copping Rogelia, MD;  Location: [facility] END[facility];  Service: Endoscopy;  Laterality: N/A;   EYE SURGERY     HERNIA REPAIR Left 20+ years ago   inguinal herniorrhapy   HOT HEMOSTASIS  12/26/2023   Procedure: EGD, WITH ARGON PLASMA COAGULATION;  Surgeon: Copping Rogelia, MD;  Location: [facility] END[facility];  Service: Endoscopy;;   JOINT REPLACEMENT  left knee 2016   KNEE SURGERY     ? loose body/ chondral defect 46yrs  ROTATOR CUFF REPAIR     left   SPINE SURGERY  10-12 years ago   SUBMUCOSAL INJECTION  12/26/2023   Procedure: INJECTION, SUBMUCOSAL;  Surgeon: Jinny Carmine, MD;  Location: [facility] END[facility];  Service: Endoscopy;;   TONSILLECTOMY     TOTAL KNEE ARTHROPLASTY Left 06/22/2014   Procedure: TOTAL KNEE ARTHROPLASTY;  Surgeon: Fonda SHAUNNA Olmsted, MD;  Location: [facility];  Service: Orthopedics;  Laterality: Left;    No Known Allergies  Prior to Admission medications   Medication Sig Start Date End Date Taking? Authorizing Provider  amoxicillin -clavulanate (AUGMENTIN )  875-125 MG tablet Take 1 tablet by mouth 2 (two) times daily for 4 days. 12/23/23 12/27/23 Yes Marylynn Verneita CROME, MD  azithromycin  (ZITHROMAX  Z-PAK) 250 MG tablet Take 1 tablet (250 mg total) by mouth daily for 4 days. 12/23/23 12/27/23 Yes Tullo, Teresa L, MD  clotrimazole-betamethasone (LOTRISONE) cream Apply topically. 09/03/23  Yes [provider]  mirtazapine  (REMERON ) 7.5 MG tablet Take 1 tablet (7.5 mg total) by mouth at bedtime. 11/28/23  Yes Marylynn Verneita CROME, MD  tadalafil  (CIALIS ) 20 MG tablet TAKE ONE TABLET DAILY AS NEEDED FOR ERECTILE DYSFUNCTION 11/26/23  Yes Marylynn Verneita CROME, MD  tamsulosin  (FLOMAX ) 0.4 MG CAPS capsule Take 2 capsules (0.8 mg total) by mouth daily. 08/07/23  Yes Hope Merle, MD  Testosterone  75 MG PLLT by Implant route.   Yes [provider]  thyroid  (NP THYROID ) 60 MG tablet Take 1 tablet (60 mg total) by mouth daily. 10/28/23  Yes Marylynn Verneita CROME, MD  Zinc  Oxide 10 % OINT Apply twice daily to sacral decubitus ulcer 11/28/23  Yes Marylynn Verneita CROME, MD  Boron 6 MG TABS Take by mouth daily.    [provider]  loperamide  (IMODIUM ) 2 MG capsule Take 1 capsule (2 mg total) by mouth as needed for diarrhea or loose stools. Patient not taking: Reported on 12/26/2023 08/06/23   Hope Merle, MD  Multiple Vitamin (MULTIVITAMIN WITH MINERALS) TABS tablet Take 1 tablet by mouth 2 (two) times daily. Patient not taking: Reported on 12/26/2023    [provider]  mupirocin ointment (BACTROBAN) 2 % SMARTSIG:1 Application Topical 2-3 Times Daily Patient not taking: Reported on 12/26/2023 09/09/23   [provider]  OVER THE COUNTER MEDICATION Take 20,000 mcg by mouth daily. Vitamin B12-methylfolate Patient not taking: Reported on 12/26/2023    [provider]  sertraline  (ZOLOFT ) 50 MG tablet Take 50 mg by mouth daily.    [provider]    Social History   Socioeconomic History   Marital status: Married    Spouse name: Not on file    Number of children: 2   Years of education: Not on file   Highest education level: Bachelor's degree (e.g., BA, AB, BS)  Occupational History   Occupation: textiles  Tobacco Use   Smoking status: Former    Current packs/day: 0.00    Average packs/day: 1 pack/day for 15.0 years (15.0 ttl pk-yrs)    Types: Cigarettes    Start date: 06/14/1968    Quit date: 06/15/1983    Years since quitting: 40.5   Smokeless tobacco: Current    Types: Chew  Vaping Use   Vaping status: Never Used  Substance and Sexual Activity   Alcohol use: Yes    Alcohol/week: 10.0 standard drinks of alcohol    Types: 10 Glasses of wine per week    Comment: pt reports 3 glasses per day   Drug use: No   Sexual activity:  Not Currently  Other Topics Concern   Not on file  Social History Narrative   [employer] graduate   Avid hunter   Very active   Walks 1 hour daily   Social Drivers of Health   Financial Resource Strain: Low Risk  (07/03/2023)   Overall Financial Resource Strain (CARDIA)    Difficulty of Paying Living Expenses: Not hard at all  Food Insecurity: No Food Insecurity (12/26/2023)   Hunger Vital Sign    Worried About Running Out of Food in the Last Year: Never true    Ran Out of Food in the Last Year: Never true  Transportation Needs: No Transportation Needs (12/26/2023)   PRAPARE - Administrator, Civil Service (Medical): No    Lack of Transportation (Non-Medical): No  Physical Activity: Sufficiently Active (07/03/2023)   Exercise Vital Sign    Days of Exercise per Week: 3 days    Minutes of Exercise per Session: 60 min  Stress: No Stress Concern Present (07/03/2023)   [employer] of Occupational Health - Occupational Stress Questionnaire    Feeling of Stress : Not at all  Recent Concern: Stress - Stress Concern Present (05/19/2023)   [employer] of Occupational Health - Occupational Stress Questionnaire    Feeling of Stress : Rather much  Social Connections: Moderately  Integrated (12/26/2023)   Social Connection and Isolation Panel    Frequency of Communication with Friends and Family: More than three times a week    Frequency of Social Gatherings with Friends and Family: More than three times a week    Attends Religious Services: Never    Database administrator or Organizations: Yes    Attends Engineer, structural: More than 4 times per year    Marital Status: Married  Catering manager Violence: Not At Risk (12/26/2023)   Humiliation, Afraid, Rape, and Kick questionnaire    Fear of Current or Ex-Partner: No    Emotionally Abused: No    Physically Abused: No    Sexually Abused: No     Family History  Problem Relation Age of Onset   Alcohol abuse Mother    Diabetes Mother    Mental retardation Mother    Arthritis Father    Hearing loss Father    Alcohol abuse Father     ROS: Otherwise negative unless mentioned in HPI  Physical Examination  Vitals:   12/27/23 0330 12/27/23 0536  BP: (!) 111/57 (!) 100/57  Pulse: 75 88  Resp: 16 18  Temp: 98 F (36.7 C) 98.3 F (36.8 C)  SpO2: 97% 100%   Body mass index is 21.02 kg/m.  General:  WDWN in NAD Gait: Not observed HENT: WNL, normocephalic Pulmonary: normal non-labored breathing, without Rales, rhonchi,  wheezing Cardiac: regular, without  Murmurs, rubs or gallops; without carotid bruits Abdomen: Positive bowel sounds throughout, soft, NT/ND, no masses Skin: without rashes Vascular Exam/Pulses: Palpable pulses throughout in all extremities are warm. Extremities: without ischemic changes, without Gangrene , without cellulitis; without open wounds;  Musculoskeletal: no muscle wasting or atrophy  Neurologic: A&O X 3;  No focal weakness or paresthesias are detected; speech is fluent/normal Psychiatric:  The pt has Normal affect. Lymph:  Unremarkable  CBC    Component Value Date/Time   WBC 4.6 12/27/2023 0454   RBC 1.99 (L) 12/27/2023 0454   HGB 7.5 (L) 12/27/2023 0454   HGB  13.5 01/21/2023 0950   HCT 21.9 (L) 12/27/2023 0454   PLT 126 (L) 12/27/2023  0454   PLT 119 (L) 01/21/2023 0950   MCV 110.1 (H) 12/27/2023 0454   MCH 37.7 (H) 12/27/2023 0454   MCHC 34.2 12/27/2023 0454   RDW 13.6 12/27/2023 0454   LYMPHSABS 0.4 (L) 12/20/2023 1054   MONOABS 0.4 12/20/2023 1054   EOSABS 0.0 12/20/2023 1054   BASOSABS 0.0 12/20/2023 1054    BMET    Component Value Date/Time   NA 140 12/27/2023 0454   NA 134 (A) 04/30/2019 0000   NA 142 04/30/2019 0000   K 3.5 12/27/2023 0454   CL 111 12/27/2023 0454   CO2 22 12/27/2023 0454   GLUCOSE 90 12/27/2023 0454   BUN 29 (H) 12/27/2023 0454   BUN 21 04/30/2019 0000   CREATININE 0.83 12/27/2023 0454   CALCIUM 8.0 (L) 12/27/2023 0454   GFRNONAA >60 12/27/2023 0454   GFRAA 90 04/30/2019 0000    COAGS: Lab Results  Component Value Date   INR 1.3 (H) 12/26/2023   INR 1.4 (H) 12/21/2023   INR 1.3 (H) 12/20/2023     Non-Invasive Vascular Imaging:   Upper Endoscopy performed By Gastroenterology  Statin:  No. Beta Blocker:  No. Aspirin:  No. ACEI:  No. ARB:  No. CCB use:  No Other antiplatelets/anticoagulants:  No.    ASSESSMENT/PLAN: This is a 83 y.o. male who was hospitalized and discharged just 4 days ago for pneumonia returns to [facility] emergency department after having multiple episodes of melanotic stools over the last 3 to 4 weeks.  Patient underwent upper endoscopy yesterday and was noted to have a duodenal ulcer that was bleeding.  It was cauterized and injected with epinephrine  but continues to bleed.  Patient's hemoglobin was down from 8.4-7.5 today.  Patient endorses he has not vomited any blood but continues to have dark tarry stools.  Vascular surgery consulted to embolize.  Vascular surgery plans on taking the patient to the vascular lab later today on 12/27/2023 for embolization of the duodenal ulcer.  Patient was seen by gastroenterology who performed an upper endoscopy and found a duodenal ulcer.   Ulcer was treated with cauterization and injections of epinephrine  but continues to bleed.  I discussed in detail at the bedside this morning with the patient the procedure, benefits, risk, complications.  Patient verbalizes understanding wishes to proceed.  I answered all his questions this morning.  Patient was made n.p.o. immediately.  Patient had not eaten breakfast today.    -I discussed the case in detail with Dr. Cordella Shawl MD and he agrees with the plan.   Gwendlyn JONELLE Shank [facility] 12/27/2023 8:45 AM

## 2023-12-27 NOTE — Progress Notes (Signed)
 Tony Copping, MD [facility]   [address], [address] [place], [place] [address] Phone: [phone] Fax : [phone]   Subjective: The patient had an episode of dark stools yesterday around midnight and again this morning.  The patient's hemoglobin when he came in was 8.0 and then was 9.6 without a blood transfusion and then is down to 7.5 today.  The patient had a upper endoscopy with a large ulcer covering the duodenum and a bleeding vessel in the duodenal bulb.  The area was extensively cauterized and injected with epinephrine .   Objective: Vital signs in last 24 hours: Vitals:   12/26/23 2005 12/27/23 0001 12/27/23 0330 12/27/23 0536  BP: 110/69 (!) 105/47 (!) 111/57 (!) 100/57  Pulse: 75 98 75 88  Resp: 18 20 16 18   Temp: (!) 97.5 F (36.4 C) 98.2 F (36.8 C) 98 F (36.7 C) 98.3 F (36.8 C)  TempSrc:      SpO2: 100% 95% 97% 100%  Weight:      Height:       Weight change:   Intake/Output Summary (Last 24 hours) at 12/27/2023 0720 Last data filed at 12/27/2023 0500 Gross per 24 hour  Intake 1224.1 ml  Output --  Net 1224.1 ml     Exam: Heart:: Regular rate and rhythm or without murmur or extra heart sounds Lungs: normal and clear to auscultation and percussion Abdomen: soft, nontender, normal bowel sounds   Lab Results: @LABTEST2 @ Micro Results: Recent Results (from the past 240 hours)  SARS Coronavirus 2 by RT PCR (hospital order, performed in [facility] hospital lab) *cepheid single result test* Anterior Nasal Swab     Status: None   Collection Time: 12/20/23  1:45 PM   Specimen: Anterior Nasal Swab  Result Value Ref Range Status   SARS Coronavirus 2 by RT PCR NEGATIVE NEGATIVE Final    Comment: (NOTE) SARS-CoV-2 target nucleic acids are NOT DETECTED.  The SARS-CoV-2 RNA is generally detectable in upper and lower respiratory specimens during the acute phase of infection. The lowest concentration of SARS-CoV-2 viral copies this assay can detect is  250 copies / mL. A negative result does not preclude SARS-CoV-2 infection and should not be used as the sole basis for treatment or other patient management decisions.  A negative result may occur with improper specimen collection / handling, submission of specimen other than nasopharyngeal swab, presence of viral mutation(s) within the areas targeted by this assay, and inadequate number of viral copies (<250 copies / mL). A negative result must be combined with clinical observations, patient history, and epidemiological information.  Fact Sheet for Patients:   RoadLapTop.co.za  Fact Sheet for Healthcare Providers: http://kim-miller.com/  This test is not yet approved or  cleared by the [place]  FDA and has been authorized for detection and/or diagnosis of SARS-CoV-2 by FDA under an Emergency Use Authorization (EUA).  This EUA will remain in effect (meaning this test can be used) for the duration of the COVID-19 declaration under Section 564(b)(1) of the Act, 21 U.S.C. section 360bbb-3(b)(1), unless the authorization is terminated or revoked sooner.  Performed at [facility], [address] [place], [place] [address]   Blood Culture (routine x 2)     Status: None   Collection Time: 12/20/23  3:01 PM   Specimen: BLOOD  Result Value Ref Range Status   Specimen Description BLOOD BLOOD LEFT ARM  Final   Special Requests   Final    BOTTLES DRAWN AEROBIC AND ANAEROBIC  Blood Culture adequate volume   Culture   Final    NO GROWTH 5 DAYS Performed at [facility], [address] [place], [place] [address]    Report Status 12/25/2023 FINAL  Final  Blood Culture (routine x 2)     Status: None   Collection Time: 12/20/23  3:01 PM   Specimen: BLOOD  Result Value Ref Range Status   Specimen Description BLOOD BLOOD RIGHT ARM  Final   Special Requests   Final    BOTTLES DRAWN AEROBIC AND ANAEROBIC Blood Culture  adequate volume   Culture   Final    NO GROWTH 5 DAYS Performed at [facility], [address], [place], [place] [address]    Report Status 12/25/2023 FINAL  Final  MRSA Next Gen by PCR, Nasal     Status: None   Collection Time: 12/20/23  9:48 PM   Specimen: Nasal Mucosa; Nasal Swab  Result Value Ref Range Status   MRSA by PCR Next Gen NOT DETECTED NOT DETECTED Final    Comment: (NOTE) The GeneXpert MRSA Assay (FDA approved for NASAL specimens only), is one component of a comprehensive MRSA colonization surveillance program. It is not intended to diagnose MRSA infection nor to guide or monitor treatment for MRSA infections. Test performance is not FDA approved in patients less than 34 years old. Performed at [facility], [address], [place], [place] [address]    Studies/Results: [facility] Chest Port 1 View Result Date: 12/26/2023 CLINICAL DATA:  Hypoxia. EXAM: PORTABLE CHEST 1 VIEW COMPARISON:  Chest radiograph dated 12/20/2023. FINDINGS: Shallow inspiration. Bilateral lower lung field opacities, new since the prior radiograph most consistent with pneumonia. No pleural effusion or pneumothorax. Biapical calcified plaques. Stable cardiac silhouette no acute osseous pathology. IMPRESSION: Bilateral lower lung field pneumonia. Electronically Signed   By: Vanetta Chou M.D.   On: 12/26/2023 16:21   Medications: I have reviewed the patient's current medications. Scheduled Meds:  pantoprazole  (PROTONIX ) IV  40 mg Intravenous Q12H   Continuous Infusions:  0.9 % NaCl with KCl 40 mEq / L 50 mL/hr at 12/27/23 0037   cefTRIAXone  (ROCEPHIN )  IV     PRN Meds:.ondansetron  **OR** ondansetron  (ZOFRAN ) IV   Assessment: Principal Problem:   Melena Active Problems:   Thrombocytopenia (HCC)   Benign prostatic hyperplasia with urinary obstruction   Transaminitis   Alcohol use disorder   ABLA (acute blood loss anemia)   Macrocytic anemia   Coronary artery calcification    Aortic atherosclerosis (HCC)   Protein-calorie malnutrition, severe (HCC)   Hyperbilirubinemia   Coagulopathy (HCC)   UGI bleed   Alcoholic cirrhosis (HCC)   Pressure injury of sacral region, stage 1   Aortic regurgitation   Aortic stenosis   Hypokalemia    Plan: This patient had a large duodenal ulcer with a visible vessel actively bleeding in the duodenum with extensive cautery and injection of epinephrine  in the area with cessation of the bleeding at that time.  It appears that the patient has had a drop in hemoglobin but unlikely from the 9.6 yesterday since the patient did not get a blood transfusion and that 9.6 seems to be abnormally high.  I would consider the 8.0 or the 8.4 hemoglobin yesterday to be more consistent with his true value which has gone down to 7.5 today.  I would recommend a vascular surgery consultation with possible embolization of the lesion since any further cautery would highly risk a perforation.  I have sent  a message to the hospitalist and I have discussed this with the patient.  Dr. Maryruth will be covering this weekend.   LOS: 0 days   Tony Copping, MD.FACG 12/27/2023, 7:20 AM Pager [phone] 7am-5pm  Check AMION for 5pm -7am coverage and on weekends

## 2023-12-27 NOTE — Plan of Care (Signed)
 Patient has rested throughout current shift without incident  noted to have had 2 episodes of bloody stool, small amount.  Patient remains asymptomatic.  Currently receiving IVF, per MD order of 0.9% NS with 40Meq KCL,  via PIV.  Patient noted to tolerate IV therapy without distress.   Dressing to sacral site replaced.  Wound sites exhibit non-blanchable wound bed, noted to have pink. Moist appearance.  Peri wound site remains intact upon inspection.  Noted to have scattered bruises to skin surface.  Patient has verbally denied any pain/discomfort during current shift.  Patient to continue to be monitored by hospital staff, until discharged.

## 2023-12-28 DIAGNOSIS — K922 Gastrointestinal hemorrhage, unspecified: Secondary | ICD-10-CM | POA: Diagnosis not present

## 2023-12-28 LAB — COMPREHENSIVE METABOLIC PANEL WITH GFR
ALT: 99 U/L — ABNORMAL HIGH (ref 0–44)
AST: 170 U/L — ABNORMAL HIGH (ref 15–41)
Albumin: 1.7 g/dL — ABNORMAL LOW (ref 3.5–5.0)
Alkaline Phosphatase: 128 U/L — ABNORMAL HIGH (ref 38–126)
Anion gap: 5 (ref 5–15)
BUN: 20 mg/dL (ref 8–23)
CO2: 25 mmol/L (ref 22–32)
Calcium: 7.4 mg/dL — ABNORMAL LOW (ref 8.9–10.3)
Chloride: 108 mmol/L (ref 98–111)
Creatinine, Ser: 0.79 mg/dL (ref 0.61–1.24)
GFR, Estimated: >60 mL/min (ref >60)
Glucose, Bld: 103 mg/dL — ABNORMAL HIGH (ref 70–99)
Potassium: 3.7 mmol/L (ref 3.5–5.1)
Sodium: 138 mmol/L (ref 135–145)
Total Bilirubin: 5.4 mg/dL — ABNORMAL HIGH (ref 0.0–1.2)
Total Protein: 4.1 g/dL — ABNORMAL LOW (ref 6.5–8.1)

## 2023-12-28 LAB — HEMOGLOBIN AND HEMATOCRIT, BLOOD
HCT: 31.6 % — ABNORMAL LOW (ref 39.0–52.0)
Hemoglobin: 10.7 g/dL — ABNORMAL LOW (ref 13.0–17.0)

## 2023-12-28 LAB — PHOSPHORUS: Phosphorus: 2 mg/dL — ABNORMAL LOW (ref 2.5–4.6)

## 2023-12-28 LAB — CBC
HCT: 20.6 % — ABNORMAL LOW (ref 39.0–52.0)
Hemoglobin: 6.9 g/dL — ABNORMAL LOW (ref 13.0–17.0)
MCH: 37.9 pg — ABNORMAL HIGH (ref 26.0–34.0)
MCHC: 33.5 g/dL (ref 30.0–36.0)
MCV: 113.2 fL — ABNORMAL HIGH (ref 80.0–100.0)
Platelets: 130 K/uL — ABNORMAL LOW (ref 150–400)
RBC: 1.82 MIL/uL — ABNORMAL LOW (ref 4.22–5.81)
RDW: 13.5 % (ref 11.5–15.5)
WBC: 3.8 K/uL — ABNORMAL LOW (ref 4.0–10.5)
nRBC: 0 % (ref 0.0–0.2)

## 2023-12-28 LAB — MAGNESIUM: Magnesium: 1.7 mg/dL (ref 1.7–2.4)

## 2023-12-28 LAB — PREPARE RBC (CROSSMATCH)

## 2023-12-28 MED ORDER — POTASSIUM PHOSPHATES 15 MMOLE/5ML IV SOLN
30.0000 mmol | Freq: Once | INTRAVENOUS | Status: AC
  Administered 2023-12-28: 30 mmol via INTRAVENOUS
  Filled 2023-12-28: qty 10

## 2023-12-28 MED ORDER — MIRTAZAPINE 15 MG PO TABS
7.5000 mg | ORAL_TABLET | Freq: Every day | ORAL | Status: DC
Start: 2023-12-28 — End: 2024-01-01
  Administered 2023-12-28 – 2023-12-31 (×4): 7.5 mg via ORAL
  Filled 2023-12-28 (×4): qty 1

## 2023-12-28 MED ORDER — THYROID 60 MG PO TABS
60.0000 mg | ORAL_TABLET | Freq: Every day | ORAL | Status: DC
  Administered 2023-12-28 – 2024-01-01 (×5): 60 mg via ORAL
  Filled 2023-12-28 (×6): qty 1

## 2023-12-28 MED ORDER — TAMSULOSIN HCL 0.4 MG PO CAPS
0.8000 mg | ORAL_CAPSULE | Freq: Every day | ORAL | Status: DC
  Administered 2023-12-28 – 2023-12-31 (×4): 0.8 mg via ORAL
  Filled 2023-12-28 (×4): qty 2

## 2023-12-28 MED ORDER — SUCRALFATE 1 GM/10ML PO SUSP
1.0000 g | Freq: Three times a day (TID) | ORAL | Status: DC
  Administered 2023-12-28 – 2024-01-01 (×16): 1 g via ORAL
  Filled 2023-12-28 (×16): qty 10

## 2023-12-28 MED ORDER — SODIUM CHLORIDE 0.9% IV SOLUTION: Freq: Once | INTRAVENOUS | Status: DC

## 2023-12-28 NOTE — Evaluation (Signed)
 Physical Therapy Evaluation Patient Details Name: Tony Luna MRN: [id number] DOB: 27-May-1941 Today's Date: 12/28/2023  History of Present Illness  83 y.o. male with medical history significant of seasonal allergies, arthritis, unspecified arrhythmia, coronary artery calcification, aortic atherosclerosis, hypothyroidism, rotator cuff tear, sleep apnea, EtOH abuse, alcoholic cirrhosis, history of esophageal who was just recently discharged after being admitted for acute respiratory failure with hypoxia in the setting of community-acquired pneumonia and elevated troponin who is now here due to episodes of melanotic stools over the past 3-4 weeks.  Clinical Impression  Pt had transfusion earlier today (Hgb was 6.9) but feel well and with some energy, eager to try some ambulation.  Ultimately he did well doing 2 bouts of 113ft, with and w/o AD.  He has some history of multiple knee surgeries/replacements and had some very low grade buckling that he easily self arrested.  Pt on 2L O2 on arrival with SpO2 99%, tried room air and stayed high 90s laying, but dropped to high 80s getting to EOB and donning socks.  Remained on 2L t/o remainder of session.  Pt is not at his baseline and will need continued PT to address functional issues.        If plan is discharge home, recommend the following: A little help with walking and/or transfers;Help with stairs or ramp for entrance   Can travel by private vehicle        Equipment Recommendations None recommended by PT  Recommendations for Other Services       Functional Status Assessment Patient has had a recent decline in their functional status and demonstrates the ability to make significant improvements in function in a reasonable and predictable amount of time.     Precautions / Restrictions Precautions Precautions: Fall Recall of Precautions/Restrictions: Intact Restrictions Weight Bearing Restrictions Per Provider Order: No      Mobility   Bed Mobility Overal bed mobility: Independent Bed Mobility: Supine to Sit     Supine to sit: Supervision          Transfers Overall transfer level: Modified independent Equipment used: Rolling walker (2 wheels) Transfers: Sit to/from Stand Sit to Stand: Supervision           General transfer comment: cuing for appropriate walker use (does not typically use) but able to rise w/o physical assist    Ambulation/Gait Ambulation/Gait assistance: Contact guard assist Gait Distance (Feet): 100 Feet Assistive device: Rolling walker (2 wheels), 1 person hand held assist, None         General Gait Details: 2 bouts of 100 ft with brief seated rest break.  Pt starts with the walker, but was minimally reliant on it and did ~75 ft w/o AD using either no UE assist or incidental use of hallway rail.  He did have some SOB/fatigue but was able to converse the entire time.  SpO2 down to mid 80s on 2L during ambulation, quickly up to mid 90s with seated rest and next 100 ft with walker as knees did have occsional mild buckling (no LOBs or need for assist)  Stairs            Wheelchair Mobility     Tilt Bed    Modified Rankin (Stroke Patients Only)       Balance Overall balance assessment: Needs assistance, History of Falls Sitting-balance support: Feet supported Sitting balance-Leahy Scale: Normal Sitting balance - Comments: Patient able to don socks in sitting.  No loss of balance.   Standing balance support:  No upper extremity supported Standing balance-Leahy Scale: Fair Standing balance comment: good balance with walker, fair w/o.                             Pertinent Vitals/Pain Pain Assessment Pain Assessment: 0-10 Pain Score: 1  Pain Location: his bottom    Home Living Family/patient expects to be discharged to:: Private residence Living Arrangements: Alone Available Help at Discharge: Family Type of Home: House (recently moved into [place]) Home Access: Level entry       Home Layout: One level        Prior Function Prior Level of Function : Independent/Modified Independent;Driving;History of Falls (last six months)             Mobility Comments: indep, reports history of recent 1-2 falls - ADLs Comments: basic ADLS independent - take laundry to be done , have cleaning service  coming in , drive - eat out or pick up meails     Extremity/Trunk Assessment   Upper Extremity Assessment Upper Extremity Assessment: Overall WFL for tasks assessed;Generalized weakness    Lower Extremity Assessment Lower Extremity Assessment: Overall WFL for tasks assessed;Generalized weakness       Communication   Communication Communication: Impaired Factors Affecting Communication: Hearing impaired (assisted with setting up new hearing aides)    Cognition Arousal: Alert Behavior During Therapy: WFL for tasks assessed/performed   PT - Cognitive impairments: No apparent impairments                         Following commands: Intact       Cueing Cueing Techniques: Verbal cues     General Comments      Exercises     Assessment/Plan    PT Assessment Patient needs continued PT services  PT Problem List Decreased strength;Decreased activity tolerance;Decreased balance;Decreased mobility;Cardiopulmonary status limiting activity       PT Treatment Interventions DME instruction;Gait training;Therapeutic exercise;Balance training    PT Goals (Current goals can be found in the Care Plan section)  Acute Rehab PT Goals Patient Stated Goal: to go home PT Goal Formulation: With patient Time For Goal Achievement: 01/10/24 Potential to Achieve Goals: Good    Frequency Min 2X/week     Co-evaluation               AM-PAC PT 6 Clicks Mobility  Outcome Measure Help needed turning from your back to your side while in a flat bed without using bedrails?: None Help needed moving from lying on your  back to sitting on the side of a flat bed without using bedrails?: None Help needed moving to and from a bed to a chair (including a wheelchair)?: None Help needed standing up from a chair using your arms (e.g., wheelchair or bedside chair)?: None Help needed to walk in hospital room?: A Little Help needed climbing 3-5 steps with a railing? : A Little 6 Click Score: 22    End of Session Equipment Utilized During Treatment: Oxygen Activity Tolerance: Patient tolerated treatment well Patient left: with call bell/phone within reach;in chair Nurse Communication: Mobility status PT Visit Diagnosis: Muscle weakness (generalized) (M62.81);Difficulty in walking, not elsewhere classified (R26.2);Unsteadiness on feet (R26.81)    Time: 8386-8349 PT Time Calculation (min) (ACUTE ONLY): 37 min   Charges:   PT Evaluation $PT Eval Low Complexity: 1 Low PT Treatments $Gait Training: 8-22 mins PT General Charges $$ ACUTE PT  VISIT: 1 Visit         Carmin JONELLE Deed, DPT 12/28/2023, 5:40 PM

## 2023-12-28 NOTE — Progress Notes (Signed)
 [facility] Progress Note  Patient: Tony Luna    FMW:[id number]  DOA: 12/26/2023     Date of Service: the patient was seen and examined on 12/28/2023  Chief Complaint  Patient presents with   Rectal Bleeding   Brief hospital course: 83 y.o. male with medical history significant of seasonal allergies, arthritis, unspecified arrhythmia, coronary artery calcification, aortic atherosclerosis, hypothyroidism, rotator cuff tear, sleep apnea, EtOH abuse, alcoholic cirrhosis, history of esophageal who was discharged 4 days ago after being admitted for acute respiratory failure with hypoxia in the setting of community-acquired pneumonia and elevated troponin who is coming to the emergency department due to episodes of melanotic stools over the past 3-4 weeks.  He has mild postural lightheadedness. He has been sleeping more than usual and reports feeling more fatigue.  No abdominal pain, nausea, emesis, diarrhea or constipation. No flank pain, dysuria, frequency or hematuria.He denied fever, chills, rhinorrhea, sore throat, wheezing or hemoptysis. No chest pain, palpitations, diaphoresis, PND, orthopnea or pitting edema of the lower extremities. No polyuria, polydipsia, polyphagia or blurred vision.    Lab work: CBC showed a white count of 6.5, hemoglobin 8.0 g/dL (8.8 g/dL 4 days ago and 87.7 g/dL 13 days ago), MCV 888.8 fL and platelets 131.  B12 level and folate level were normal 3 months ago.  PT 17.4 and INR 1.3.  CMP showed 737, potassium 3.3, chloride 106 and CO2 23 mmol/L with a normal anion gap.  Glucose 114, BUN 39, creatinine 0.79 and calcium 9.0 (corrected to albumin level 10.7 mg/dL).  Total protein 4.8 and albumin 1.9 g/dL.  AST 853, ALT 888, alkaline phosphatase 162 units/L and total bilirubin 6.1 mg/dL.   ED course: Initial vital signs were temperature 98 F, pulse 100, respiration 18, BP 127/67 mmHg O2 sat 96% on room air.  The patient received ceftriaxone  1 g IVPB, pantoprazole  80 mg  IVP and vancomycin  1500 mg IVPB.    Assessment and Plan:  # Melena /UGI bleed In the setting nq:Jornynopr cirrhosi Resulting pw:JAOJ (acute blood loss anemia) Macrocytic anemia  s/p IV fluids. Continue pantoprazole  twice a day. Monitor H&H. Transfuse if hemoglobin drops below 8.0 g/dL. Had normal B12 and folate recently. - Underwent EGD was found to have an ulcer.  Ulcer was cauterized.  Vascular surgery consulted for possible embolization if patient continues to bleed. Continue pantoprazole  40 mg IV twice daily, transition to oral PPI at discharge 8/2 started Carafate  8/2 Hb 6.9, transfuse 1 unit of PRBC.  Monitor H&H     Alcohol use disorder Has ceased alcohol for the past week. No withdrawal symptoms. Magnesium  sulfate 2 g IVPB x 1.   Alcoholic cirrhosis (HCC) Associated with:   Hyperbilirubinemia,   Transaminitis And   Coagulopathy (HCC) Function numbers seems to be improving. Follow-up CMP with morning labs. Follow-up PT/INR adequate.   Thrombocytopenia  In the setting of cirrhosis. Monitor platelet count.   Pressure injury of sacral region, stage 1  Continue ceftriaxone  1 g IVPB. Continue prophylactic pressure relieving measures.   Hypokalemia  Replacing. Supplemented with magnesium  sulfate 2 g IVPB.   Hypophosphatemia, Phos repleted. Monitor and replete as needed.  Coronary artery calcification Aortic atherosclerosis  No angina, palpitations or diaphoresis. No recent complaints of lower extremity edema.   Aortic regurgitation  Aortic stenosis  Follow-up with cardiology as needed.   Benign prostatic hyperplasia with urinary obstruction Continue tamsulosin  0.8 mg p.o. qhs.   Protein-calorie malnutrition, severe  In the setting of anemia and cirrhosis. May  benefit from protein supplementation. Consider nutritional services evaluation. Follow-up albumin level. Continue alcohol cessation.   Body mass index is 21.02 kg/m.  Interventions:  Diet:  CLD DVT Prophylaxis: SCDs due to GI bleeding    Advance goals of care discussion: DNR/DNI-limited  Family Communication: family was present at bedside, at the time of interview.  The pt provided permission to discuss medical plan with the family. Opportunity was given to ask question and all questions were answered satisfactorily.   Disposition:  Pt is from home, admitted with GI bleeding, still has low hemoglobin and risk of GI bleeding, which precludes a safe discharge. Discharge to home, when stable, may need few days to improve.  Subjective: No significant events overnight, patient denies any active bleeding at this time, no chest pain or repressing, no shortness of breath.  No abdominal pain.  Physical Exam: General: NAD, lying comfortably Appear in no distress, affect appropriate Eyes: PERRLA ENT: Oral Mucosa Clear, moist  Neck: no JVD,  Cardiovascular: S1 and S2 Present, no Murmur,  Respiratory: good respiratory effort, Bilateral Air entry equal and Decreased, no Crackles, no wheezes Abdomen: Bowel Sound present, Soft and no tenderness,  Skin: no rashes Extremities: no Pedal edema, no calf tenderness Neurologic: without any new focal findings Gait not checked due to patient safety concerns  Vitals:   12/28/23 0758 12/28/23 1141 12/28/23 1145 12/28/23 1200  BP: 113/69 126/80 126/80 111/73  Pulse: 79 60 87 73  Resp: 16  18 18   Temp: 98.2 F (36.8 C) 97.6 F (36.4 C) 97.6 F (36.4 C) 97.6 F (36.4 C)  TempSrc:  Oral Oral   SpO2: 99% 98% 99%   Weight:      Height:        Intake/Output Summary (Last 24 hours) at 12/28/2023 1527 Last data filed at 12/28/2023 0900 Gross per 24 hour  Intake 820 ml  Output 350 ml  Net 470 ml   Filed Weights   12/26/23 1031 12/26/23 1351  Weight: 70.3 kg 70.3 kg    Data Reviewed: I have personally reviewed and interpreted daily labs, tele strips, imagings as discussed above. I reviewed all nursing notes, pharmacy notes, vitals,  pertinent old records I have discussed plan of care as described above with RN and patient/family.  CBC: Recent Labs  Lab 12/22/23 0409 12/26/23 1039 12/26/23 1619 12/26/23 2122 12/27/23 0454 12/28/23 0544  WBC 4.0 6.5  --   --  4.6 3.8*  HGB 8.8* 8.0* 8.4* 9.6* 7.5* 6.9*  HCT 25.3* 24.0* 25.6* 29.2* 21.9* 20.6*  MCV 105.0* 111.1*  --   --  110.1* 113.2*  PLT 101* 131*  --   --  126* 130*   Basic Metabolic Panel: Recent Labs  Lab 12/22/23 0409 12/26/23 1036 12/26/23 1039 12/27/23 0454 12/28/23 0544  NA 135  --  137 140 138  K 3.4*  --  3.3* 3.5 3.7  CL 106  --  106 111 108  CO2 22  --  23 22 25   GLUCOSE 102*  --  114* 90 103*  BUN 25*  --  39* 29* 20  CREATININE 0.60*  --  0.79 0.83 0.79  CALCIUM 8.1*  --  9.0 8.0* 7.4*  MG  --  1.7  --   --  1.7  PHOS  --  3.1  --   --  2.0*    Studies: No results found.  Scheduled Meds:  sodium chloride    Intravenous Once   mirtazapine   7.5 mg  Oral QHS   pantoprazole  (PROTONIX ) IV  40 mg Intravenous Q12H   sucralfate   1 g Oral TID WC & HS   tamsulosin   0.8 mg Oral QPC supper   thyroid   60 mg Oral Q0600   Continuous Infusions:  cefTRIAXone  (ROCEPHIN )  IV 1 g (12/28/23 0944)   potassium PHOSPHATE  IVPB (in mmol)     PRN Meds: ondansetron  **OR** ondansetron  (ZOFRAN ) IV  Time spent: 55 minutes  Author: ELVAN SOR. MD [facility] 12/28/2023 3:27 PM  To reach On-call, see care teams to locate the attending and reach out to them via [email or web address]. If 7PM-7AM, please contact night-coverage If you still have difficulty reaching the attending provider, please page the [facility] (Director on Call) for [facility] on amion for assistance.

## 2023-12-28 NOTE — Progress Notes (Signed)
 [facility]  Daily Progress Note   Subjective  -   Patient still somewhat sore particular from his sacral ulcer.  No bright red blood per rectum.  Has not had a bowel movement yet this morning. Hemoglobin this morning 6.9.  Was 7.5 yesterday.  Daughter states he is to get a blood transfusion today.  Objective Vitals:   12/27/23 1944 12/28/23 0000 12/28/23 0447 12/28/23 0758  BP: 114/61 125/71 117/65 113/69  Pulse: 90 91 88 79  Resp: 18 18 18 16   Temp: 98.7 F (37.1 C) 99.4 F (37.4 C) 97.8 F (36.6 C) 98.2 F (36.8 C)  TempSrc:  Oral    SpO2: 100% 99% 99% 99%  Weight:      Height:        Intake/Output Summary (Last 24 hours) at 12/28/2023 1117 Last data filed at 12/28/2023 0900 Gross per 24 hour  Intake 820 ml  Output 350 ml  Net 470 ml    PULM  CTAB CV  RRR ABD   soft, minimally tender  Laboratory CBC    Component Value Date/Time   WBC 3.8 (L) 12/28/2023 0544   HGB 6.9 (L) 12/28/2023 0544   HGB 13.5 01/21/2023 0950   HCT 20.6 (L) 12/28/2023 0544   PLT 130 (L) 12/28/2023 0544   PLT 119 (L) 01/21/2023 0950    BMET    Component Value Date/Time   NA 138 12/28/2023 0544   NA 134 (A) 04/30/2019 0000   NA 142 04/30/2019 0000   K 3.7 12/28/2023 0544   CL 108 12/28/2023 0544   CO2 25 12/28/2023 0544   GLUCOSE 103 (H) 12/28/2023 0544   BUN 20 12/28/2023 0544   BUN 21 04/30/2019 0000   CREATININE 0.79 12/28/2023 0544   CALCIUM 7.4 (L) 12/28/2023 0544   GFRNONAA >60 12/28/2023 0544   GFRAA 90 04/30/2019 0000    Assessment/Planning:   Upper GI bleed.  Largely due to duodenal ulcer.  Had treatment by endoscopy 2 days ago. Hemoglobin continues to drift slowly.  He is to get 1 unit of packed red blood cells today for hemoglobin of 6.9.  Hemoglobin was 7.5 yesterday Discussed with he and his family that we will observe how he responds to the blood transfusion today and check CBC tonight and tomorrow. If he continues to drift down,  consideration for gastroduodenal artery embolization can be given and likely should be performed No urgent need for embolization today and if his hemoglobin stabilizes we can hold on embolization for now. Have discussed with primary service Will follow.   Selinda Gu  12/28/2023, 11:17 AM

## 2023-12-28 NOTE — Consult Note (Signed)
 Please note that the [facility] nursing team is utilizing a standardized work plan to manage patient consults. We are triaging consults and will try to see the patients within 48 hours. Wound photos in the patient's chart allow us  to consult on the patient in the most efficient and timely manner.    Thank-you,  Lela Holm BSN, CNS, RN, [employer], WOCN  (Phone [phone])

## 2023-12-29 ENCOUNTER — Encounter: Payer: Self-pay | Admitting: Internal Medicine

## 2023-12-29 DIAGNOSIS — K922 Gastrointestinal hemorrhage, unspecified: Secondary | ICD-10-CM | POA: Diagnosis not present

## 2023-12-29 DIAGNOSIS — K921 Melena: Secondary | ICD-10-CM | POA: Diagnosis not present

## 2023-12-29 LAB — CBC
HCT: 28 % — ABNORMAL LOW (ref 39.0–52.0)
Hemoglobin: 9.5 g/dL — ABNORMAL LOW (ref 13.0–17.0)
MCH: 35.4 pg — ABNORMAL HIGH (ref 26.0–34.0)
MCHC: 33.9 g/dL (ref 30.0–36.0)
MCV: 104.5 fL — ABNORMAL HIGH (ref 80.0–100.0)
Platelets: 136 K/uL — ABNORMAL LOW (ref 150–400)
RBC: 2.68 MIL/uL — ABNORMAL LOW (ref 4.22–5.81)
RDW: 18.6 % — ABNORMAL HIGH (ref 11.5–15.5)
WBC: 4.7 K/uL (ref 4.0–10.5)
nRBC: 0 % (ref 0.0–0.2)

## 2023-12-29 LAB — HEPATIC FUNCTION PANEL
ALT: 113 U/L — ABNORMAL HIGH (ref 0–44)
AST: 182 U/L — ABNORMAL HIGH (ref 15–41)
Albumin: 2 g/dL — ABNORMAL LOW (ref 3.5–5.0)
Alkaline Phosphatase: 153 U/L — ABNORMAL HIGH (ref 38–126)
Bilirubin, Direct: 3.1 mg/dL — ABNORMAL HIGH (ref 0.0–0.2)
Indirect Bilirubin: 2.9 mg/dL — ABNORMAL HIGH (ref 0.3–0.9)
Total Bilirubin: 6 mg/dL — ABNORMAL HIGH (ref 0.0–1.2)
Total Protein: 5.3 g/dL — ABNORMAL LOW (ref 6.5–8.1)

## 2023-12-29 LAB — BASIC METABOLIC PANEL WITH GFR
Anion gap: 8 (ref 5–15)
BUN: 15 mg/dL (ref 8–23)
CO2: 21 mmol/L — ABNORMAL LOW (ref 22–32)
Calcium: 7.5 mg/dL — ABNORMAL LOW (ref 8.9–10.3)
Chloride: 109 mmol/L (ref 98–111)
Creatinine, Ser: 0.67 mg/dL (ref 0.61–1.24)
GFR, Estimated: >60 mL/min (ref >60)
Glucose, Bld: 101 mg/dL — ABNORMAL HIGH (ref 70–99)
Potassium: 3.5 mmol/L (ref 3.5–5.1)
Sodium: 138 mmol/L (ref 135–145)

## 2023-12-29 LAB — HEMOGLOBIN AND HEMATOCRIT, BLOOD
HCT: 29.6 % — ABNORMAL LOW (ref 39.0–52.0)
Hemoglobin: 9.7 g/dL — ABNORMAL LOW (ref 13.0–17.0)

## 2023-12-29 LAB — PHOSPHORUS: Phosphorus: 2.7 mg/dL (ref 2.5–4.6)

## 2023-12-29 LAB — MAGNESIUM: Magnesium: 1.7 mg/dL (ref 1.7–2.4)

## 2023-12-29 MED ORDER — COLLAGENASE 250 UNIT/GM EX OINT
TOPICAL_OINTMENT | Freq: Every day | CUTANEOUS | Status: DC
  Filled 2023-12-29: qty 30

## 2023-12-29 MED ORDER — ZINC OXIDE 40 % EX OINT
TOPICAL_OINTMENT | Freq: Two times a day (BID) | CUTANEOUS | Status: DC
  Administered 2023-12-29 – 2023-12-30 (×2): 1 via TOPICAL
  Filled 2023-12-29: qty 113

## 2023-12-29 MED ORDER — MEDIHONEY WOUND/BURN DRESSING EX PSTE
1.0000 | PASTE | Freq: Every day | CUTANEOUS | Status: DC
  Administered 2023-12-29 – 2024-01-01 (×4): 1 via TOPICAL
  Filled 2023-12-29: qty 44

## 2023-12-29 NOTE — Progress Notes (Signed)
 [facility]  Daily Progress Note   Subjective  -   No bleeding overnight.  Had 1 normal bowel movement yesterday that was not dark or bloody.  Not having any pain today.  Received 1 unit of packed red blood cells yesterday and hemoglobin was 6.9 prior to transfusion and is 9.5 this morning.  Objective Vitals:   12/28/23 2000 12/29/23 0022 12/29/23 0438 12/29/23 0833  BP: (!) 113/55 (!) 113/58 (!) 116/59 137/84  Pulse: 80 93 95 96  Resp: 19 18 18 18   Temp: 98.5 F (36.9 C) 98.2 F (36.8 C) (!) 97.5 F (36.4 C) 97.8 F (36.6 C)  TempSrc: Oral     SpO2: 100% 100% 98% 100%  Weight:      Height:        Intake/Output Summary (Last 24 hours) at 12/29/2023 0853 Last data filed at 12/28/2023 1900 Gross per 24 hour  Intake 1130 ml  Output --  Net 1130 ml    PULM  CTAB CV  RRR   Laboratory CBC    Component Value Date/Time   WBC 4.7 12/29/2023 0515   HGB 9.5 (L) 12/29/2023 0515   HGB 13.5 01/21/2023 0950   HCT 28.0 (L) 12/29/2023 0515   PLT 136 (L) 12/29/2023 0515   PLT 119 (L) 01/21/2023 0950    BMET    Component Value Date/Time   NA 138 12/29/2023 0515   NA 134 (A) 04/30/2019 0000   NA 142 04/30/2019 0000   K 3.5 12/29/2023 0515   CL 109 12/29/2023 0515   CO2 21 (L) 12/29/2023 0515   GLUCOSE 101 (H) 12/29/2023 0515   BUN 15 12/29/2023 0515   BUN 21 04/30/2019 0000   CREATININE 0.67 12/29/2023 0515   CALCIUM 7.5 (L) 12/29/2023 0515   GFRNONAA >60 12/29/2023 0515   GFRAA 90 04/30/2019 0000    Assessment/Planning:   Upper GI bleed.  Hemoglobin rose more than would be expected from 1 unit of blood yesterday.  Hemoglobin was stable at 9.5 this morning. His only bowel movement yesterday was nonbloody and nonmelenic.  Says it was a normal bowel movement. No role for embolization as there does not appear to be active bleeding at this point, but if he rebleeds embolization of the gastroduodenal artery would be appropriate. Okay to advance diet  from my standpoint.   Selinda Gu  12/29/2023, 8:53 AM

## 2023-12-29 NOTE — Evaluation (Signed)
 Occupational Therapy Evaluation Patient Details Name: Tony Luna MRN: [id number] DOB: 1940-10-10 Today's Date: 12/29/2023   History of Present Illness   83 y.o. male with medical history significant of seasonal allergies, arthritis, unspecified arrhythmia, coronary artery calcification, aortic atherosclerosis, hypothyroidism, rotator cuff tear, sleep apnea, EtOH abuse, alcoholic cirrhosis, history of esophageal who was just recently discharged after being admitted for acute respiratory failure with hypoxia in the setting of community-acquired pneumonia and elevated troponin who is now here due to episodes of melanotic stools over the past 3-4 weeks.     Clinical Impressions Patient presenting with decreased Ind in self care,balance, functional mobility/transfers, endurance, and safety awareness.  Patient reports recently moving to [place] and being Ind at baseline. Pt's family present during evaluation. Pt performs bed mobility without assistance and stands with supervision.Pt ambulates and pushes IV poles to sink to brush teeth and wash face. Pt then ambulates 100' in hallway with supervision - CGA as pt begins to fatigue. Pt on RA during session and is at 89% saturation when returning to bed and placed on 1L at end of session. Pt educated on use of incentive spirometer and he demonstrated with multiple reps this session. Call bell and all needed items within reach upon exiting the room.  Patient will benefit from acute OT to increase overall independence in the areas of ADLs, functional mobility, and safety awareness in order to safely discharge.     If plan is discharge home, recommend the following:   A little help with walking and/or transfers;A little help with bathing/dressing/bathroom;Assistance with cooking/housework     Functional Status Assessment   Patient has had a recent decline in their functional status and demonstrates the ability to make significant improvements in  function in a reasonable and predictable amount of time.     Equipment Recommendations   None recommended by OT      Precautions/Restrictions   Precautions Precautions: Fall Recall of Precautions/Restrictions: Intact     Mobility Bed Mobility Overal bed mobility: Independent Bed Mobility: Supine to Sit, Sit to Supine     Supine to sit: Supervision Sit to supine: Supervision        Transfers Overall transfer level: Modified independent Equipment used: 1 person hand held assist Transfers: Sit to/from Stand Sit to Stand: Supervision                  Balance Overall balance assessment: Needs assistance, History of Falls Sitting-balance support: Feet supported Sitting balance-Leahy Scale: Normal     Standing balance support: No upper extremity supported Standing balance-Leahy Scale: Fair                             ADL either performed or assessed with clinical judgement   ADL Overall ADL's : Needs assistance/impaired     Grooming: Wash/dry hands;Wash/dry face;Oral care;Standing;Supervision/safety                   Toilet Transfer: Supervision/safety;Ambulation Toilet Transfer Details (indicate cue type and reason): simulated         Functional mobility during ADLs: Supervision/safety       Vision Baseline Vision/History: 1 Wears glasses Patient Visual Report: No change from baseline              Pertinent Vitals/Pain Pain Assessment Pain Assessment: Faces Faces Pain Scale: Hurts a little bit Pain Location: his bottom Pain Descriptors / Indicators: Discomfort Pain Intervention(s): Monitored during session,  Repositioned     Extremity/Trunk Assessment Upper Extremity Assessment Upper Extremity Assessment: Overall WFL for tasks assessed;Generalized weakness   Lower Extremity Assessment Lower Extremity Assessment: Overall WFL for tasks assessed;Generalized weakness       Communication  Communication Communication: Impaired Factors Affecting Communication: Hearing impaired   Cognition Arousal: Alert Behavior During Therapy: WFL for tasks assessed/performed Cognition: No apparent impairments                               Following commands: Intact       Cueing  General Comments   Cueing Techniques: Verbal cues              Home Living Family/patient expects to be discharged to:: Private residence Living Arrangements: Alone Available Help at Discharge: Family Type of Home: House (moved to [place]) Home Access: Level entry Secretary/administrator of Steps: 3 Entrance Stairs-Rails: None Home Layout: One level Alternate Level Stairs-Number of Steps: Per daughter pt lives downstairs - live at [employer] Shower/Tub: Producer, television/film/video: Standard     Home Equipment: None   Additional Comments: shower chair inside shower -but no rails per family -      Prior Functioning/Environment Prior Level of Function : Independent/Modified Independent;Driving;History of Falls (last six months)             Mobility Comments: indep, reports history of recent 1-2 falls - ADLs Comments: basic ADLS independent - take laundry to be done , have cleaning service  coming in , drive - eat out or pick up meails    OT Problem List: Decreased strength;Decreased activity tolerance;Impaired balance (sitting and/or standing);Decreased safety awareness   OT Treatment/Interventions: Self-care/ADL training;Therapeutic exercise;Neuromuscular education;Energy conservation;Patient/family education;DME and/or AE instruction;Balance training      OT Goals(Current goals can be found in the care plan section)   Acute Rehab OT Goals Patient Stated Goal: to go home OT Goal Formulation: With patient/family Time For Goal Achievement: 01/12/24 Potential to Achieve Goals: Good ADL Goals Pt Will Perform Grooming: with modified  independence;standing Pt Will Perform Lower Body Dressing: with modified independence;sit to/from stand Pt Will Transfer to Toilet: with modified independence;ambulating Pt Will Perform Toileting - Clothing Manipulation and hygiene: with modified independence;sit to/from stand   OT Frequency:  Min 2X/week       AM-PAC OT 6 Clicks Daily Activity     Outcome Measure Help from another person eating meals?: None Help from another person taking care of personal grooming?: None Help from another person toileting, which includes using toliet, bedpan, or urinal?: A Little Help from another person bathing (including washing, rinsing, drying)?: A Little Help from another person to put on and taking off regular upper body clothing?: A Little Help from another person to put on and taking off regular lower body clothing?: A Little 6 Click Score: 20   End of Session Equipment Utilized During Treatment: Oxygen Nurse Communication: Mobility status (incentive spirometer and O2 decreased)  Activity Tolerance: Patient tolerated treatment well Patient left: in bed;with family/visitor present  OT Visit Diagnosis: Muscle weakness (generalized) (M62.81);History of falling (Z91.81)                Time: 8870-8841 OT Time Calculation (min): 29 min Charges:  OT General Charges $OT Visit: 1 Visit OT Evaluation $OT Eval Low Complexity: 1 Low OT Treatments $Self Care/Home Management : 8-22 mins  Izetta Claude, MS, OTR/L ,  CBIS ascom [phone]  12/29/23, 12:34 PM

## 2023-12-29 NOTE — Progress Notes (Signed)
 [facility] Progress Note  Patient: Tony Luna    FMW:[id number]  DOA: 12/26/2023     Date of Service: the patient was seen and examined on 12/29/2023  Chief Complaint  Patient presents with   Rectal Bleeding   Brief hospital course: 83 y.o. male with medical history significant of seasonal allergies, arthritis, unspecified arrhythmia, coronary artery calcification, aortic atherosclerosis, hypothyroidism, rotator cuff tear, sleep apnea, EtOH abuse, alcoholic cirrhosis, history of esophageal who was discharged 4 days ago after being admitted for acute respiratory failure with hypoxia in the setting of community-acquired pneumonia and elevated troponin who is coming to the emergency department due to episodes of melanotic stools over the past 3-4 weeks.  He has mild postural lightheadedness. He has been sleeping more than usual and reports feeling more fatigue.  No abdominal pain, nausea, emesis, diarrhea or constipation. No flank pain, dysuria, frequency or hematuria.He denied fever, chills, rhinorrhea, sore throat, wheezing or hemoptysis. No chest pain, palpitations, diaphoresis, PND, orthopnea or pitting edema of the lower extremities. No polyuria, polydipsia, polyphagia or blurred vision.    Lab work: CBC showed a white count of 6.5, hemoglobin 8.0 g/dL (8.8 g/dL 4 days ago and 87.7 g/dL 13 days ago), MCV 888.8 fL and platelets 131.  B12 level and folate level were normal 3 months ago.  PT 17.4 and INR 1.3.  CMP showed 737, potassium 3.3, chloride 106 and CO2 23 mmol/L with a normal anion gap.  Glucose 114, BUN 39, creatinine 0.79 and calcium 9.0 (corrected to albumin level 10.7 mg/dL).  Total protein 4.8 and albumin 1.9 g/dL.  AST 853, ALT 888, alkaline phosphatase 162 units/L and total bilirubin 6.1 mg/dL.   ED course: Initial vital signs were temperature 98 F, pulse 100, respiration 18, BP 127/67 mmHg O2 sat 96% on room air.  The patient received ceftriaxone  1 g IVPB, pantoprazole  80 mg  IVP and vancomycin  1500 mg IVPB.    Assessment and Plan:  # Melena /UGI bleed In the setting nq:Jornynopr cirrhosi Resulting pw:JAOJ (acute blood loss anemia) Macrocytic anemia  s/p IV fluids. Continue pantoprazole  twice a day. Monitor H&H. Transfuse if hemoglobin drops below 8.0 g/dL. Had normal B12 and folate recently. - Underwent EGD was found to have an ulcer.  Ulcer was cauterized.  Vascular surgery consulted for possible embolization if patient continues to bleed. Continue pantoprazole  40 mg IV twice daily, transition to oral PPI at discharge 8/2 started Carafate  8/2 Hb 6.9, transfuse 1 unit of PRBC.  Monitor H&H 8/3 Hb 9.7, stable, received transfusion yesterday.  No active bleeding   # Alcohol use disorder Has ceased alcohol for the past week. No withdrawal symptoms. Magnesium  sulfate 2 g IVPB x 1.   Alcoholic cirrhosis (HCC) Associated with:   Hyperbilirubinemia,   Transaminitis And   Coagulopathy (HCC) Function numbers seems to be improving. Follow-up CMP with morning labs. Follow-up PT/INR adequate.   Thrombocytopenia  In the setting of cirrhosis. Monitor platelet count.   Pressure injury of sacral region, stage 1  Continue ceftriaxone  1 g IVPB. Continue prophylactic pressure relieving measures.   Hypokalemia  Replacing. Supplemented with magnesium  sulfate 2 g IVPB.   Hypophosphatemia, Phos repleted. Monitor and replete as needed.  Coronary artery calcification Aortic atherosclerosis  No angina, palpitations or diaphoresis. No recent complaints of lower extremity edema.   Aortic regurgitation  Aortic stenosis  Follow-up with cardiology as needed.   Benign prostatic hyperplasia with urinary obstruction Continue tamsulosin  0.8 mg p.o. qhs.   Protein-calorie malnutrition,  severe  In the setting of anemia and cirrhosis. May benefit from protein supplementation. Consider nutritional services evaluation. Follow-up albumin level. Continue alcohol  cessation.   Body mass index is 21.02 kg/m.  Interventions:  Diet: CLD>> soft diet started on 8/3 DVT Prophylaxis: SCDs due to GI bleeding    Advance goals of care discussion: DNR/DNI-limited  Family Communication: family was present at bedside, at the time of interview.  The pt provided permission to discuss medical plan with the family. Opportunity was given to ask question and all questions were answered satisfactorily.   Disposition:  Pt is from home, admitted with GI bleeding, still has low hemoglobin and risk of GI bleeding, which precludes a safe discharge. Discharge to home, when stable, may need few days to improve.  Subjective: No significant events overnight, patient had 1 BM last night and 1 BM today morning, greenish color, no visible bleeding.  Complaining of pain due to bedsores, no any other complaints.  Patient was advised to change position every 12 hourly.   Physical Exam: General: NAD, lying comfortably Appear in no distress, affect appropriate Eyes: PERRLA ENT: Oral Mucosa Clear, moist  Neck: no JVD,  Cardiovascular: S1 and S2 Present, no Murmur,  Respiratory: good respiratory effort, Bilateral Air entry equal and Decreased, no Crackles, no wheezes Abdomen: Bowel Sound present, Soft and no tenderness,  Skin: Multiple pressure ulcers, pictures saved in the media. Extremities: no Pedal edema, no calf tenderness Neurologic: without any new focal findings Gait not checked due to patient safety concerns  Vitals:   12/29/23 0438 12/29/23 0833 12/29/23 1217 12/29/23 1604  BP: (!) 116/59 137/84 123/70 129/85  Pulse: 95 96 86 86  Resp: 18 18 18 18   Temp: (!) 97.5 F (36.4 C) 97.8 F (36.6 C) 97.6 F (36.4 C) (!) 97.5 F (36.4 C)  TempSrc:      SpO2: 98% 100% 97% 98%  Weight:      Height:        Intake/Output Summary (Last 24 hours) at 12/29/2023 1737 Last data filed at 12/29/2023 1500 Gross per 24 hour  Intake 1180 ml  Output 500 ml  Net 680 ml    Filed Weights   12/26/23 1031 12/26/23 1351  Weight: 70.3 kg 70.3 kg    Data Reviewed: I have personally reviewed and interpreted daily labs, tele strips, imagings as discussed above. I reviewed all nursing notes, pharmacy notes, vitals, pertinent old records I have discussed plan of care as described above with RN and patient/family.  CBC: Recent Labs  Lab 12/26/23 1039 12/26/23 1619 12/27/23 0454 12/28/23 0544 12/28/23 1657 12/29/23 0515 12/29/23 1353  WBC 6.5  --  4.6 3.8*  --  4.7  --   HGB 8.0*   < > 7.5* 6.9* 10.7* 9.5* 9.7*  HCT 24.0*   < > 21.9* 20.6* 31.6* 28.0* 29.6*  MCV 111.1*  --  110.1* 113.2*  --  104.5*  --   PLT 131*  --  126* 130*  --  136*  --    < > = values in this interval not displayed.   Basic Metabolic Panel: Recent Labs  Lab 12/26/23 1036 12/26/23 1039 12/27/23 0454 12/28/23 0544 12/29/23 0515  NA  --  137 140 138 138  K  --  3.3* 3.5 3.7 3.5  CL  --  106 111 108 109  CO2  --  23 22 25  21*  GLUCOSE  --  114* 90 103* 101*  BUN  --  39* 29* 20 15  CREATININE  --  0.79 0.83 0.79 0.67  CALCIUM  --  9.0 8.0* 7.4* 7.5*  MG 1.7  --   --  1.7 1.7  PHOS 3.1  --   --  2.0* 2.7    Studies: No results found.  Scheduled Meds:  sodium chloride    Intravenous Once   leptospermum manuka honey  1 Application Topical Daily   liver oil-zinc  oxide   Topical BID   mirtazapine   7.5 mg Oral QHS   pantoprazole  (PROTONIX ) IV  40 mg Intravenous Q12H   sucralfate   1 g Oral TID WC & HS   tamsulosin   0.8 mg Oral QPC supper   thyroid   60 mg Oral Q0600   Continuous Infusions:  cefTRIAXone  (ROCEPHIN )  IV 1 g (12/29/23 1052)   PRN Meds: ondansetron  **OR** ondansetron  (ZOFRAN ) IV  Time spent: 40 minutes  Author: ELVAN SOR. MD [facility] 12/29/2023 5:37 PM  To reach On-call, see care teams to locate the attending and reach out to them via [email or web address]. If 7PM-7AM, please contact night-coverage If you still have difficulty reaching the  attending provider, please page the [facility] (Director on Call) for [facility] on amion for assistance.

## 2023-12-29 NOTE — Consult Note (Addendum)
 WOC Nurse Consult Note: patient known to WOC team previous admission (11/2023) with Stage 3 Pressure Injuries to buttocks; santyl  started at that visit  Reason for Consult: pressure ulcers  Wound type: Stage 3 Pressure Injuries Buttocks  Pressure Injury POA: Yes Measurement: see nursing flowsheet; appears to be 4 separate areas noted to buttocks/upper buttocks sacrum  Wound bed:100% tan  Drainage (amount, consistency, odor) see nursing flowsheet  Periwound: erythema  Dressing procedure/placement/frequency:  Cleanse buttocks wounds with NS, apply Medihoney to wound beds daily, cover with dry gauze and secure with silicone foam or ABD pads whichever is preferred.   Will write for Desitin 2 times a day to surrounding intact skin of buttocks/sacrum/posterior thighs.   POC discussed with bedside nurse.  Appreciate R. Justice, Rn assistance with this consult.   WOC team will not follow. Re-consult if further needs arise.   Thank you,    Powell Bar MSN, RN-BC, [employer]

## 2023-12-29 NOTE — Plan of Care (Signed)

## 2023-12-30 DIAGNOSIS — K921 Melena: Secondary | ICD-10-CM | POA: Diagnosis not present

## 2023-12-30 LAB — MAGNESIUM: Magnesium: 1.6 mg/dL — ABNORMAL LOW (ref 1.7–2.4)

## 2023-12-30 LAB — BASIC METABOLIC PANEL WITH GFR
Anion gap: 9 (ref 5–15)
BUN: 14 mg/dL (ref 8–23)
CO2: 21 mmol/L — ABNORMAL LOW (ref 22–32)
Calcium: 7.5 mg/dL — ABNORMAL LOW (ref 8.9–10.3)
Chloride: 106 mmol/L (ref 98–111)
Creatinine, Ser: 0.62 mg/dL (ref 0.61–1.24)
GFR, Estimated: >60 mL/min (ref >60)
Glucose, Bld: 101 mg/dL — ABNORMAL HIGH (ref 70–99)
Potassium: 3.8 mmol/L (ref 3.5–5.1)
Sodium: 136 mmol/L (ref 135–145)

## 2023-12-30 LAB — CBC
HCT: 30.5 % — ABNORMAL LOW (ref 39.0–52.0)
Hemoglobin: 10 g/dL — ABNORMAL LOW (ref 13.0–17.0)
MCH: 35.3 pg — ABNORMAL HIGH (ref 26.0–34.0)
MCHC: 32.8 g/dL (ref 30.0–36.0)
MCV: 107.8 fL — ABNORMAL HIGH (ref 80.0–100.0)
Platelets: 160 K/uL (ref 150–400)
RBC: 2.83 MIL/uL — ABNORMAL LOW (ref 4.22–5.81)
RDW: 17.8 % — ABNORMAL HIGH (ref 11.5–15.5)
WBC: 6.5 K/uL (ref 4.0–10.5)
nRBC: 0 % (ref 0.0–0.2)

## 2023-12-30 LAB — HEPATIC FUNCTION PANEL
ALT: 108 U/L — ABNORMAL HIGH (ref 0–44)
AST: 171 U/L — ABNORMAL HIGH (ref 15–41)
Albumin: 1.9 g/dL — ABNORMAL LOW (ref 3.5–5.0)
Alkaline Phosphatase: 164 U/L — ABNORMAL HIGH (ref 38–126)
Bilirubin, Direct: 3.2 mg/dL — ABNORMAL HIGH (ref 0.0–0.2)
Indirect Bilirubin: 3.1 mg/dL — ABNORMAL HIGH (ref 0.3–0.9)
Total Bilirubin: 6.3 mg/dL — ABNORMAL HIGH (ref 0.0–1.2)
Total Protein: 5 g/dL — ABNORMAL LOW (ref 6.5–8.1)

## 2023-12-30 LAB — PHOSPHORUS: Phosphorus: 1.9 mg/dL — ABNORMAL LOW (ref 2.5–4.6)

## 2023-12-30 MED ORDER — MAGNESIUM SULFATE 2 GM/50ML IV SOLN
2.0000 g | Freq: Once | INTRAVENOUS | Status: AC
  Administered 2023-12-30: 2 g via INTRAVENOUS
  Filled 2023-12-30: qty 50

## 2023-12-30 MED ORDER — POTASSIUM PHOSPHATES 15 MMOLE/5ML IV SOLN
30.0000 mmol | Freq: Once | INTRAVENOUS | Status: AC
  Administered 2023-12-30: 30 mmol via INTRAVENOUS
  Filled 2023-12-30: qty 10

## 2023-12-30 NOTE — Anesthesia Postprocedure Evaluation (Signed)
 Anesthesia Post Note  Patient: Tony Luna  Procedure(s) Performed: EGD (ESOPHAGOGASTRODUODENOSCOPY) INJECTION, SUBMUCOSAL EGD, WITH ARGON PLASMA COAGULATION  Patient location during evaluation: PACU Anesthesia Type: General Level of consciousness: awake Pain management: satisfactory to patient Vital Signs Assessment: post-procedure vital signs reviewed and stable Respiratory status: spontaneous breathing Cardiovascular status: blood pressure returned to baseline Anesthetic complications: no   There were no known notable events for this encounter.   Last Vitals:  Vitals:   12/30/23 0011 12/30/23 0427  BP: 122/71 113/70  Pulse: (!) 102 88  Resp: 20 20  Temp: 37.3 C 37 C  SpO2: 96% 97%    Last Pain:  Vitals:   12/30/23 0427  TempSrc: Oral  PainSc:                  VAN STAVEREN,Wyett Narine

## 2023-12-30 NOTE — Progress Notes (Signed)
 This patient underwent an upper endoscopy with a visible vessel and a large duodenal ulcer.  The vessel was cauterized extensively and the bleeding had stopped but the blood count had trended down therefore vascular surgery was consulted and have been following the patient.  Repeat endoscopy on this patient would be high risk of perforation since a significant amount of the ulcer was burned in the previous EGD.  Nothing further to do from a GI point of view.  I will sign off.  Please call if any further GI concerns or questions.  We would like to thank you for the opportunity to participate in the care of Tony Luna.

## 2023-12-30 NOTE — Plan of Care (Signed)
 Patient remains free from any noted signs of acute distress.  Will continue to be monitored by hospital staff.

## 2023-12-30 NOTE — TOC Initial Note (Signed)
 Transition of Care [facility]) - Initial/Assessment Note    Patient Details  Name: Tony Luna MRN: [id number] Date of Birth: [birth year]  Transition of Care [facility]) CM/SW Contact:    Dalia GORMAN Fuse, RN Phone Number: 12/30/2023, 3:13 PM  Clinical Narrative:                 Patient is from [facility] ILF. Therapy recommending home with [facility] PT/OT. The facility requested the patient be worked up for [employer] TOC explained the patient amb 75 ft without AD and ambulated another 100 ft with RW and assistance. The patient is not appropriate for SNF at this time.  TOC reached out to MD to request [facility] PT/OT orders. Referral for [facility] PT/OT sent to [facility], Shaun accepted the referral.   TOC will continue to follow.  Expected Discharge Plan: Home w Home Health Services Barriers to Discharge: Continued Medical Work up   Patient Goals and CMS Choice     Choice offered to / list presented to : Patient      Expected Discharge Plan and Services   Discharge Planning Services: CM Consult Post Acute Care Choice: Home Health Living arrangements for the past 2 months: Independent Living Facility                                      Prior Living Arrangements/Services Living arrangements for the past 2 months: Independent Living Facility Lives with:: Spouse                   Activities of Daily Living   ADL Screening (condition at time of admission) Independently performs ADLs?: Yes (appropriate for developmental age) Is the patient deaf or have difficulty hearing?: No Does the patient have difficulty seeing, even when wearing glasses/contacts?: No Does the patient have difficulty concentrating, remembering, or making decisions?: No  Permission Sought/Granted                  Emotional Assessment              Admission diagnosis:  Melena [K92.1] Patient Active Problem List   Diagnosis Date Noted   Melena 12/26/2023   ABLA (acute blood loss anemia) 12/26/2023    Macrocytic anemia 12/26/2023   Coronary artery calcification 12/26/2023   Aortic atherosclerosis (HCC) 12/26/2023   Protein-calorie malnutrition, severe (HCC) 12/26/2023   Hyperbilirubinemia 12/26/2023   Coagulopathy (HCC) 12/26/2023   UGI bleed 12/26/2023   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   Pressure injury of sacral region, stage 1 12/26/2023   Aortic regurgitation 12/26/2023   Aortic stenosis 12/26/2023   Hypokalemia 12/26/2023   Acute hypoxic respiratory failure (HCC) 12/20/2023   CAP (community acquired pneumonia) 12/20/2023   LFT elevation 12/20/2023   Ecchymosis of eye, initial encounter 12/20/2023   Ecchymosis on examination 12/20/2023   Traumatic ecchymosis 12/20/2023   Traumatic ecchymosis of cheek 12/20/2023   SIRS due to infectious process with acute organ dysfunction (HCC) 12/20/2023   Elevated troponin 12/20/2023   Alcohol use disorder 12/20/2023   Lactic acid blood increased 12/20/2023   Pressure injury of skin 12/20/2023   Decubitus ulcer of right buttock 10/29/2023   Loss of balance 09/23/2023   Loose stools 08/07/2023   Elevated ferritin 07/26/2023   Transaminitis 07/26/2023   Polyp of ascending colon 04/24/2022   Iron overload 02/04/2022   Caregiver stress 02/02/2022   History of colonic polyps  Polyp of transverse colon    Encounter for preventative adult health care exam with abnormal findings 04/02/2020   Educated about COVID-19 virus infection 11/01/2018   Insomnia 11/01/2018   Prostate cancer screening 04/23/2017   Osteoporosis 03/29/2016   Stress fracture of metatarsal due to multiple or repetitive stress 01/08/2016   White coat syndrome with high blood pressure without hypertension 12/15/2015   Tubular adenoma of colon    Arthritis 04/13/2015   Body mass index (BMI) of 21.0 to 21.9 in adult 04/13/2015   Cirrhosis (HCC) 04/13/2015   Cardiac murmur 04/13/2015   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 [place] [address] Phone: [phone] Fax: [phone]     Social Drivers of Health (SDOH) Social History: SDOH Screenings   Food Insecurity: No Food Insecurity (12/26/2023)  Housing: Low Risk  (12/26/2023)  Transportation Needs: No Transportation Needs (12/26/2023)  Utilities: Not At Risk (12/26/2023)  Alcohol Screen: Low Risk  (07/03/2023)  Depression (PHQ2-9): Low Risk  (12/13/2023)  Financial Resource Strain: Low Risk  (07/03/2023)  Physical Activity: Sufficiently Active (07/03/2023)  Social Connections: Moderately Integrated (12/26/2023)  Stress: No Stress Concern Present (07/03/2023)  Recent Concern: Stress - Stress Concern Present (05/19/2023)  Tobacco Use: High Risk (12/26/2023)  Health Literacy: Adequate Health Literacy (07/03/2023)   SDOH Interventions:     Readmission Risk Interventions     No data to display

## 2023-12-30 NOTE — Progress Notes (Signed)
 [facility] Progress Note  Patient: Tony Luna    FMW:[id number]  DOA: 12/26/2023     Date of Service: the patient was seen and examined on 12/30/2023  Chief Complaint  Patient presents with   Rectal Bleeding   Brief hospital course: 83 y.o. male with medical history significant of seasonal allergies, arthritis, unspecified arrhythmia, coronary artery calcification, aortic atherosclerosis, hypothyroidism, rotator cuff tear, sleep apnea, EtOH abuse, alcoholic cirrhosis, history of esophageal who was discharged 4 days ago after being admitted for acute respiratory failure with hypoxia in the setting of community-acquired pneumonia and elevated troponin who is coming to the emergency department due to episodes of melanotic stools over the past 3-4 weeks.  He has mild postural lightheadedness. He has been sleeping more than usual and reports feeling more fatigue.  No abdominal pain, nausea, emesis, diarrhea or constipation. No flank pain, dysuria, frequency or hematuria.He denied fever, chills, rhinorrhea, sore throat, wheezing or hemoptysis. No chest pain, palpitations, diaphoresis, PND, orthopnea or pitting edema of the lower extremities. No polyuria, polydipsia, polyphagia or blurred vision.    Lab work: CBC showed a white count of 6.5, hemoglobin 8.0 g/dL (8.8 g/dL 4 days ago and 87.7 g/dL 13 days ago), MCV 888.8 fL and platelets 131.  B12 level and folate level were normal 3 months ago.  PT 17.4 and INR 1.3.  CMP showed 737, potassium 3.3, chloride 106 and CO2 23 mmol/L with a normal anion gap.  Glucose 114, BUN 39, creatinine 0.79 and calcium 9.0 (corrected to albumin level 10.7 mg/dL).  Total protein 4.8 and albumin 1.9 g/dL.  AST 853, ALT 888, alkaline phosphatase 162 units/L and total bilirubin 6.1 mg/dL.   ED course: Initial vital signs were temperature 98 F, pulse 100, respiration 18, BP 127/67 mmHg O2 sat 96% on room air.  The patient received ceftriaxone  1 g IVPB, pantoprazole  80 mg  IVP and vancomycin  1500 mg IVPB.    Assessment and Plan:  # Melena /UGI bleed In the setting nq:Jornynopr cirrhosi Resulting pw:JAOJ (acute blood loss anemia) Macrocytic anemia  s/p IV fluids. Continue pantoprazole  twice a day. Monitor H&H. Transfuse if hemoglobin drops below 8.0 g/dL. Had normal B12 and folate recently. - Underwent EGD was found to have an ulcer.  Ulcer was cauterized.  Vascular surgery consulted for possible embolization if patient continues to bleed. Continue pantoprazole  40 mg IV twice daily, transition to oral PPI at discharge 8/2 started Carafate  8/2 Hb 6.9, transfuse 1 unit of PRBC.  Monitor H&H 8/3 Hb 9.7, stable, received transfusion yesterday.  No active bleeding 8/4 Hb 10.6, stable, no active bleeding   # Alcohol use disorder Has ceased alcohol for the past week. No withdrawal symptoms. Magnesium  sulfate 2 g IVPB x 1.   Alcoholic cirrhosis (HCC) Associated with:   Hyperbilirubinemia,   Transaminitis And   Coagulopathy (HCC) Function numbers seems to be improving. Follow-up CMP with morning labs. Follow-up PT/INR adequate.   Thrombocytopenia  In the setting of cirrhosis. Monitor platelet count.   Pressure injury of sacral region, stage 1  Continue ceftriaxone  1 g IVPB. Continue prophylactic pressure relieving measures.   # Hypokalemia.  Potassium repleted and resolved # Hypomagnesemia, mag repleted. # Hypophosphatemia, Phos repleted. Monitor and replete as needed.  Coronary artery calcification Aortic atherosclerosis  No angina, palpitations or diaphoresis. No recent complaints of lower extremity edema.   Aortic regurgitation  Aortic stenosis  Follow-up with cardiology as needed.   Benign prostatic hyperplasia with urinary obstruction Continue tamsulosin  0.8  mg p.o. qhs.   Protein-calorie malnutrition, severe  In the setting of anemia and cirrhosis. May benefit from protein supplementation. Consider nutritional services  evaluation. Albumin level 1.9, low as expected Continue alcohol cessation.   Body mass index is 21.02 kg/m.  Interventions:  Diet: CLD>> soft diet started on 8/3 DVT Prophylaxis: SCDs due to GI bleeding    Advance goals of care discussion: DNR/DNI-limited  Family Communication: family was present at bedside, at the time of interview.  The pt provided permission to discuss medical plan with the family. Opportunity was given to ask question and all questions were answered satisfactorily.   Disposition:  Pt is from home, admitted with GI bleeding, still has low hemoglobin and risk of GI bleeding, which precludes a safe discharge. Discharge to home, when stable, most likely tomorrow a.m.   Subjective: No significant events overnight, patient had a greenish-yellow bowel movement, no active bleeding. Patient is complaining of pain in the pressure ulcers, no nasal complaints.   Physical Exam: General: NAD, lying comfortably Appear in no distress, affect appropriate Eyes: PERRLA ENT: Oral Mucosa Clear, moist  Neck: no JVD,  Cardiovascular: S1 and S2 Present, no Murmur,  Respiratory: good respiratory effort, Bilateral Air entry equal and Decreased, no Crackles, no wheezes Abdomen: Bowel Sound present, Soft and no tenderness,  Skin: Multiple pressure ulcers, pictures saved in the media. Extremities: no Pedal edema, no calf tenderness Neurologic: without any new focal findings Gait not checked due to patient safety concerns  Vitals:   12/30/23 0427 12/30/23 0754 12/30/23 1213 12/30/23 1601  BP: 113/70 126/79 112/74 106/64  Pulse: 88 87 100 85  Resp: 20 16 16 16   Temp: 98.6 F (37 C) 98.1 F (36.7 C) 98.1 F (36.7 C) 97.6 F (36.4 C)  TempSrc: Oral     SpO2: 97% 97% 98% 95%  Weight:      Height:        Intake/Output Summary (Last 24 hours) at 12/30/2023 1831 Last data filed at 12/30/2023 0900 Gross per 24 hour  Intake 560 ml  Output --  Net 560 ml   Filed Weights    12/26/23 1031 12/26/23 1351  Weight: 70.3 kg 70.3 kg    Data Reviewed: I have personally reviewed and interpreted daily labs, tele strips, imagings as discussed above. I reviewed all nursing notes, pharmacy notes, vitals, pertinent old records I have discussed plan of care as described above with RN and patient/family.  CBC: Recent Labs  Lab 12/26/23 1039 12/26/23 1619 12/27/23 0454 12/28/23 0544 12/28/23 1657 12/29/23 0515 12/29/23 1353 12/30/23 0549  WBC 6.5  --  4.6 3.8*  --  4.7  --  6.5  HGB 8.0*   < > 7.5* 6.9* 10.7* 9.5* 9.7* 10.0*  HCT 24.0*   < > 21.9* 20.6* 31.6* 28.0* 29.6* 30.5*  MCV 111.1*  --  110.1* 113.2*  --  104.5*  --  107.8*  PLT 131*  --  126* 130*  --  136*  --  160   < > = values in this interval not displayed.   Basic Metabolic Panel: Recent Labs  Lab 12/26/23 1036 12/26/23 1039 12/27/23 0454 12/28/23 0544 12/29/23 0515 12/30/23 0549  NA  --  137 140 138 138 136  K  --  3.3* 3.5 3.7 3.5 3.8  CL  --  106 111 108 109 106  CO2  --  23 22 25  21* 21*  GLUCOSE  --  114* 90 103* 101* 101*  BUN  --  39* 29* 20 15 14   CREATININE  --  0.79 0.83 0.79 0.67 0.62  CALCIUM  --  9.0 8.0* 7.4* 7.5* 7.5*  MG 1.7  --   --  1.7 1.7 1.6*  PHOS 3.1  --   --  2.0* 2.7 1.9*    Studies: No results found.  Scheduled Meds:  sodium chloride    Intravenous Once   leptospermum manuka honey  1 Application Topical Daily   liver oil-zinc  oxide   Topical BID   mirtazapine   7.5 mg Oral QHS   pantoprazole  (PROTONIX ) IV  40 mg Intravenous Q12H   sucralfate   1 g Oral TID WC & HS   tamsulosin   0.8 mg Oral QPC supper   thyroid   60 mg Oral Q0600   Continuous Infusions:  cefTRIAXone  (ROCEPHIN )  IV 1 g (12/30/23 1126)   PRN Meds: ondansetron  **OR** ondansetron  (ZOFRAN ) IV  Time spent: 40 minutes  Author: ELVAN SOR. MD [facility] 12/30/2023 6:31 PM  To reach On-call, see care teams to locate the attending and reach out to them via [email or web address]. If 7PM-7AM,  please contact night-coverage If you still have difficulty reaching the attending provider, please page the [facility] (Director on Call) for [facility] on amion for assistance.

## 2023-12-30 NOTE — Progress Notes (Signed)
 PT Cancellation Note  Patient Details Name: Tony Luna MRN: [id number] DOB: 02/24/1941   Cancelled Treatment:    Reason Eval/Treat Not Completed: Other (comment). Session attempted, however pt just returned back to bed after toileting and is requesting to take a nap. Education given on benefits of mobility as he is reporting back pain. Pt continues to decline therapy session. Will re-attempt another date.   Yassin Scales 12/30/2023, 2:19 PM [name], PT, DPT, GCS [phone]

## 2023-12-30 NOTE — Care Management Important Message (Signed)
 Important Message  Patient Details  Name: Tony Luna MRN: [id number] Date of Birth: 1941-03-04   Important Message Given:  Yes - Medicare IM     Maylie Ashton W, CMA 12/30/2023, 9:36 AM

## 2023-12-31 ENCOUNTER — Ambulatory Visit: Admitting: Internal Medicine

## 2023-12-31 DIAGNOSIS — K921 Melena: Secondary | ICD-10-CM | POA: Diagnosis not present

## 2023-12-31 DIAGNOSIS — K922 Gastrointestinal hemorrhage, unspecified: Secondary | ICD-10-CM | POA: Diagnosis not present

## 2023-12-31 LAB — CBC
HCT: 28.9 % — ABNORMAL LOW (ref 39.0–52.0)
Hemoglobin: 9.9 g/dL — ABNORMAL LOW (ref 13.0–17.0)
MCH: 35.9 pg — ABNORMAL HIGH (ref 26.0–34.0)
MCHC: 34.3 g/dL (ref 30.0–36.0)
MCV: 104.7 fL — ABNORMAL HIGH (ref 80.0–100.0)
Platelets: 143 K/uL — ABNORMAL LOW (ref 150–400)
RBC: 2.76 MIL/uL — ABNORMAL LOW (ref 4.22–5.81)
RDW: 17 % — ABNORMAL HIGH (ref 11.5–15.5)
WBC: 5.8 K/uL (ref 4.0–10.5)
nRBC: 0 % (ref 0.0–0.2)

## 2023-12-31 LAB — HEPATIC FUNCTION PANEL
ALT: 101 U/L — ABNORMAL HIGH (ref 0–44)
AST: 156 U/L — ABNORMAL HIGH (ref 15–41)
Albumin: 2.1 g/dL — ABNORMAL LOW (ref 3.5–5.0)
Alkaline Phosphatase: 162 U/L — ABNORMAL HIGH (ref 38–126)
Bilirubin, Direct: 3.2 mg/dL — ABNORMAL HIGH (ref 0.0–0.2)
Indirect Bilirubin: 2.6 mg/dL — ABNORMAL HIGH (ref 0.3–0.9)
Total Bilirubin: 5.8 mg/dL — ABNORMAL HIGH (ref 0.0–1.2)
Total Protein: 5.2 g/dL — ABNORMAL LOW (ref 6.5–8.1)

## 2023-12-31 LAB — PHOSPHORUS: Phosphorus: 2.2 mg/dL — ABNORMAL LOW (ref 2.5–4.6)

## 2023-12-31 LAB — BASIC METABOLIC PANEL WITH GFR
Anion gap: 3 — ABNORMAL LOW (ref 5–15)
BUN: 14 mg/dL (ref 8–23)
CO2: 23 mmol/L (ref 22–32)
Calcium: 7.5 mg/dL — ABNORMAL LOW (ref 8.9–10.3)
Chloride: 107 mmol/L (ref 98–111)
Creatinine, Ser: 0.59 mg/dL — ABNORMAL LOW (ref 0.61–1.24)
GFR, Estimated: >60 mL/min (ref >60)
Glucose, Bld: 100 mg/dL — ABNORMAL HIGH (ref 70–99)
Potassium: 3.9 mmol/L (ref 3.5–5.1)
Sodium: 133 mmol/L — ABNORMAL LOW (ref 135–145)

## 2023-12-31 LAB — MAGNESIUM: Magnesium: 1.8 mg/dL (ref 1.7–2.4)

## 2023-12-31 LAB — GLUCOSE, CAPILLARY: Glucose-Capillary: 129 mg/dL — ABNORMAL HIGH (ref 70–99)

## 2023-12-31 MED ORDER — VITAMIN C 500 MG PO TABS
500.0000 mg | ORAL_TABLET | Freq: Every day | ORAL | Status: DC
  Administered 2023-12-31 – 2024-01-01 (×2): 500 mg via ORAL
  Filled 2023-12-31 (×2): qty 1

## 2023-12-31 MED ORDER — SODIUM PHOSPHATES 45 MMOLE/15ML IV SOLN
30.0000 mmol | Freq: Once | INTRAVENOUS | Status: AC
  Administered 2023-12-31: 30 mmol via INTRAVENOUS
  Filled 2023-12-31: qty 10

## 2023-12-31 NOTE — Progress Notes (Signed)
 [facility] Progress Note  Patient: Tony Luna    FMW:[id number]  DOA: 12/26/2023     Date of Service: the patient was seen and examined on 12/31/2023  Chief Complaint  Patient presents with   Rectal Bleeding   Brief hospital course: 83 y.o. male with medical history significant of seasonal allergies, arthritis, unspecified arrhythmia, coronary artery calcification, aortic atherosclerosis, hypothyroidism, rotator cuff tear, sleep apnea, EtOH abuse, alcoholic cirrhosis, history of esophageal who was discharged 4 days ago after being admitted for acute respiratory failure with hypoxia in the setting of community-acquired pneumonia and elevated troponin who is coming to the emergency department due to episodes of melanotic stools over the past 3-4 weeks.  He has mild postural lightheadedness. He has been sleeping more than usual and reports feeling more fatigue.  No abdominal pain, nausea, emesis, diarrhea or constipation. No flank pain, dysuria, frequency or hematuria.He denied fever, chills, rhinorrhea, sore throat, wheezing or hemoptysis. No chest pain, palpitations, diaphoresis, PND, orthopnea or pitting edema of the lower extremities. No polyuria, polydipsia, polyphagia or blurred vision.    Lab work: CBC showed a white count of 6.5, hemoglobin 8.0 g/dL (8.8 g/dL 4 days ago and 87.7 g/dL 13 days ago), MCV 888.8 fL and platelets 131.  B12 level and folate level were normal 3 months ago.  PT 17.4 and INR 1.3.  CMP showed 737, potassium 3.3, chloride 106 and CO2 23 mmol/L with a normal anion gap.  Glucose 114, BUN 39, creatinine 0.79 and calcium 9.0 (corrected to albumin level 10.7 mg/dL).  Total protein 4.8 and albumin 1.9 g/dL.  AST 853, ALT 888, alkaline phosphatase 162 units/L and total bilirubin 6.1 mg/dL.   ED course: Initial vital signs were temperature 98 F, pulse 100, respiration 18, BP 127/67 mmHg O2 sat 96% on room air.  The patient received ceftriaxone  1 g IVPB, pantoprazole  80 mg  IVP and vancomycin  1500 mg IVPB.    Assessment and Plan:  # Melena /UGI bleed In the setting nq:Jornynopr cirrhosi Resulting pw:JAOJ (acute blood loss anemia) Macrocytic anemia  s/p IV fluids. Continue pantoprazole  twice a day. Monitor H&H. Transfuse if hemoglobin drops below 8.0 g/dL. Had normal B12 and folate recently. - Underwent EGD was found to have an ulcer.  Ulcer was cauterized.  Vascular surgery consulted for possible embolization if patient continues to bleed. Continue pantoprazole  40 mg IV twice daily, transition to oral PPI at discharge 8/2 started Carafate  8/2 Hb 6.9, transfuse 1 unit of PRBC.  Monitor H&H 8/3 Hb 9.7, stable, received transfusion yesterday.  No active bleeding 8/5 Hb 9.9, stable, no active bleeding   # Alcohol use disorder Has ceased alcohol for the past week. No withdrawal symptoms. Magnesium  sulfate 2 g IVPB x 1.   Alcoholic cirrhosis (HCC) Associated with:   Hyperbilirubinemia,   Transaminitis And   Coagulopathy (HCC) Function numbers seems to be improving. Follow-up CMP with morning labs. Follow-up PT/INR adequate.   Thrombocytopenia  In the setting of cirrhosis. Monitor platelet count.   Pressure injury of sacral region, stage 1  ceftriaxone  1 g IVPB for possible infection Continue prophylactic pressure relieving measures.   # Hypokalemia.  Potassium repleted and resolved # Hypomagnesemia, mag repleted.  Resolved # Hypophosphatemia, Phos repleted. Monitor and replete as needed.  Coronary artery calcification Aortic atherosclerosis  No angina, palpitations or diaphoresis. No recent complaints of lower extremity edema.   Aortic regurgitation  Aortic stenosis  Follow-up with cardiology as needed.   Benign prostatic hyperplasia with urinary  obstruction Continue tamsulosin  0.8 mg p.o. qhs.   Protein-calorie malnutrition, severe  In the setting of anemia and cirrhosis. May benefit from protein supplementation. Consider  nutritional services evaluation. Albumin level 1.9, low as expected Continue alcohol cessation.   Body mass index is 21.02 kg/m.  Interventions:  Diet: CLD>> soft diet started on 8/3 DVT Prophylaxis: SCDs due to GI bleeding    Advance goals of care discussion: DNR/DNI-limited  Family Communication: family was present at bedside, at the time of interview.  The pt provided permission to discuss medical plan with the family. Opportunity was given to ask question and all questions were answered satisfactorily.   Disposition:  Pt is from ALF, admitted with GI bleeding, status post EGD, cauterization done, bleeding stopped.  H&H stable.  Still has electrolyte imbalance, phosphorus low, which was given IV. Clinically stable, patient can be discharged to ALF tomorrow a.m. if remains stable.    Subjective: No significant events overnight, patient had a BM yesterday and today, did not notice any bleeding. Just complaining of pain in the back sores, no any other active issues.   Physical Exam: General: NAD, lying comfortably Appear in no distress, affect appropriate Eyes: PERRLA ENT: Oral Mucosa Clear, moist  Neck: no JVD,  Cardiovascular: S1 and S2 Present, no Murmur,  Respiratory: good respiratory effort, Bilateral Air entry equal and Decreased, no Crackles, no wheezes Abdomen: Bowel Sound present, Soft and no tenderness,  Skin: Multiple pressure ulcers, pictures saved in the media. Extremities: no Pedal edema, no calf tenderness Neurologic: without any new focal findings Gait not checked due to patient safety concerns  Vitals:   12/31/23 0033 12/31/23 0426 12/31/23 0758 12/31/23 1129  BP: 114/70 125/74 128/73 101/73  Pulse: 88 98 86 (!) 103  Resp: 20 20 18 18   Temp: 98.8 F (37.1 C) 97.8 F (36.6 C) 98 F (36.7 C) 97.6 F (36.4 C)  TempSrc:  Oral    SpO2: 94% 97% 95% 99%  Weight:      Height:        Intake/Output Summary (Last 24 hours) at 12/31/2023 1600 Last data  filed at 12/31/2023 1000 Gross per 24 hour  Intake 340 ml  Output 400 ml  Net -60 ml   Filed Weights   12/26/23 1031 12/26/23 1351  Weight: 70.3 kg 70.3 kg    Data Reviewed: I have personally reviewed and interpreted daily labs, tele strips, imagings as discussed above. I reviewed all nursing notes, pharmacy notes, vitals, pertinent old records I have discussed plan of care as described above with RN and patient/family.  CBC: Recent Labs  Lab 12/27/23 0454 12/28/23 0544 12/28/23 1657 12/29/23 0515 12/29/23 1353 12/30/23 0549 12/31/23 0450  WBC 4.6 3.8*  --  4.7  --  6.5 5.8  HGB 7.5* 6.9* 10.7* 9.5* 9.7* 10.0* 9.9*  HCT 21.9* 20.6* 31.6* 28.0* 29.6* 30.5* 28.9*  MCV 110.1* 113.2*  --  104.5*  --  107.8* 104.7*  PLT 126* 130*  --  136*  --  160 143*   Basic Metabolic Panel: Recent Labs  Lab 12/26/23 1036 12/26/23 1039 12/27/23 0454 12/28/23 0544 12/29/23 0515 12/30/23 0549 12/31/23 0450  NA  --    < > 140 138 138 136 133*  K  --    < > 3.5 3.7 3.5 3.8 3.9  CL  --    < > 111 108 109 106 107  CO2  --    < > 22 25 21* 21* 23  GLUCOSE  --    < >  90 103* 101* 101* 100*  BUN  --    < > 29* 20 15 14 14   CREATININE  --    < > 0.83 0.79 0.67 0.62 0.59*  CALCIUM  --    < > 8.0* 7.4* 7.5* 7.5* 7.5*  MG 1.7  --   --  1.7 1.7 1.6* 1.8  PHOS 3.1  --   --  2.0* 2.7 1.9* 2.2*   < > = values in this interval not displayed.    Studies: No results found.  Scheduled Meds:  sodium chloride    Intravenous Once   vitamin C   500 mg Oral Daily   leptospermum manuka honey  1 Application Topical Daily   liver oil-zinc  oxide   Topical BID   mirtazapine   7.5 mg Oral QHS   pantoprazole  (PROTONIX ) IV  40 mg Intravenous Q12H   sucralfate   1 g Oral TID WC & HS   tamsulosin   0.8 mg Oral QPC supper   thyroid   60 mg Oral Q0600   Continuous Infusions:  cefTRIAXone  (ROCEPHIN )  IV 1 g (12/31/23 0929)   sodium PHOSPHATE  IVPB (in mmol) 30 mmol (12/31/23 1319)   PRN Meds: ondansetron  **OR**  ondansetron  (ZOFRAN ) IV  Time spent: 40 minutes  Author: ELVAN SOR. MD [facility] 12/31/2023 4:00 PM  To reach On-call, see care teams to locate the attending and reach out to them via [email or web address]. If 7PM-7AM, please contact night-coverage If you still have difficulty reaching the attending provider, please page the [facility] (Director on Call) for [facility] on amion for assistance.

## 2023-12-31 NOTE — Progress Notes (Signed)
 Physical Therapy Treatment Patient Details Name: Tony Luna MRN: [id number] DOB: 1940-07-11 Today's Date: 12/31/2023   History of Present Illness 83 y.o. male with medical history significant of seasonal allergies, arthritis, unspecified arrhythmia, coronary artery calcification, aortic atherosclerosis, hypothyroidism, rotator cuff tear, sleep apnea, EtOH abuse, alcoholic cirrhosis, history of esophageal who was just recently discharged after being admitted for acute respiratory failure with hypoxia in the setting of community-acquired pneumonia and elevated troponin who is now here due to episodes of melanotic stools over the past 3-4 weeks.    PT Comments  Pt is making gradual progress towards goals with ability to ambulate short distances in room. OT handoff as pt finishing lunch tray upon arrival. Pt reports soreness on buttock skin sore, education given on position changes and pillow placement for sitting tolerance. When returned back to bed, pt in sidelying for pressure relief. Will continue to progress as able.    If plan is discharge home, recommend the following: A little help with walking and/or transfers;Help with stairs or ramp for entrance   Can travel by private vehicle        Equipment Recommendations  None recommended by PT    Recommendations for Other Services       Precautions / Restrictions Precautions Precautions: Fall Recall of Precautions/Restrictions: Intact Restrictions Weight Bearing Restrictions Per Provider Order: No     Mobility  Bed Mobility Overal bed mobility: Modified Independent Bed Mobility: Supine to Sit     Supine to sit: Modified independent (Device/Increase time) Sit to supine: Modified independent (Device/Increase time)   General bed mobility comments: safe technique with ease of transfer    Transfers Overall transfer level: Needs assistance Equipment used: None Transfers: Sit to/from Stand Sit to Stand: Supervision            General transfer comment: upright posture, no AD. All mobility performed on RA    Ambulation/Gait Ambulation/Gait assistance: Contact guard assist Gait Distance (Feet): 80 Feet Assistive device: None Gait Pattern/deviations: Step-through pattern       General Gait Details: ambulated in room with reciprocal gait pattern. Does tend to reach for IV pole. Needs cues for safety. Reports fatigue. Attempted to get pulse ox reading, however poor circulation.   Stairs             Wheelchair Mobility     Tilt Bed    Modified Rankin (Stroke Patients Only)       Balance Overall balance assessment: Needs assistance, History of Falls Sitting-balance support: Feet supported Sitting balance-Leahy Scale: Normal     Standing balance support: No upper extremity supported Standing balance-Leahy Scale: Fair                              Hotel manager: Impaired  Cognition Arousal: Alert Behavior During Therapy: WFL for tasks assessed/performed   PT - Cognitive impairments: No apparent impairments                       PT - Cognition Comments: pleasant, only agreeable to limited intervention Following commands: Intact      Cueing Cueing Techniques: Verbal cues  Exercises      General Comments        Pertinent Vitals/Pain Pain Assessment Pain Assessment: Faces Faces Pain Scale: Hurts a little bit Pain Location: his bottom Pain Descriptors / Indicators: Discomfort Pain Intervention(s): Limited activity within patient's tolerance, Repositioned    Home  Living                          Prior Function            PT Goals (current goals can now be found in the care plan section) Acute Rehab PT Goals Patient Stated Goal: to go home PT Goal Formulation: With patient Time For Goal Achievement: 01/10/24 Potential to Achieve Goals: Good Progress towards PT goals: Progressing toward goals    Frequency     Min 2X/week      PT Plan      Co-evaluation              AM-PAC PT 6 Clicks Mobility   Outcome Measure  Help needed turning from your back to your side while in a flat bed without using bedrails?: None Help needed moving from lying on your back to sitting on the side of a flat bed without using bedrails?: None Help needed moving to and from a bed to a chair (including a wheelchair)?: None Help needed standing up from a chair using your arms (e.g., wheelchair or bedside chair)?: None Help needed to walk in hospital room?: A Little Help needed climbing 3-5 steps with a railing? : A Little 6 Click Score: 22    End of Session Equipment Utilized During Treatment: Gait belt Activity Tolerance: Patient tolerated treatment well Patient left: in bed;with bed alarm set Nurse Communication: Mobility status PT Visit Diagnosis: Muscle weakness (generalized) (M62.81);Difficulty in walking, not elsewhere classified (R26.2);Unsteadiness on feet (R26.81)     Time: 1425-1440 PT Time Calculation (min) (ACUTE ONLY): 15 min  Charges:    $Gait Training: 8-22 mins PT General Charges $$ ACUTE PT VISIT: 1 Visit                     Tony Luna, PT, DPT, GCS [phone]    Tony Luna 12/31/2023, 3:22 PM

## 2023-12-31 NOTE — Progress Notes (Signed)
 Mobility Specialist - Progress Note   Pre-mobility: SpO2(92) on RA During mobility: SpO2(85) on RA and 88 after standing still for 1 minute Post-mobility: SPO2(93)     12/31/23 1345  Mobility  Activity Stood at bedside;Ambulated with assistance;Dangled on edge of bed  Level of Assistance Standby assist, set-up cues, supervision of patient - no hands on  Assistive Device Front wheel walker  Distance Ambulated (ft) 160 ft  Range of Motion/Exercises Active  Activity Response Tolerated well  Mobility Referral Yes  Mobility visit 1 Mobility  Mobility Specialist Start Time (ACUTE ONLY) 1332  Mobility Specialist Stop Time (ACUTE ONLY) 1351  Mobility Specialist Time Calculation (min) (ACUTE ONLY) 19 min   Pt resting in bed on RA upon entry. Pt STS and ambulates to hallway around NS SBA with RW. MS monitors lines and leads. Pt takes x1 standing rest break for 1 minute. Pt endorses SOB. Pt returned to bed and left with needs in reach and bed alarm activated. RN notified of desat.   Guido Rumble Mobility Specialist 12/31/23, 2:03 PM

## 2023-12-31 NOTE — Progress Notes (Signed)
 Occupational Therapy Treatment Patient Details Name: Tony Luna MRN: [id number] DOB: 1940/06/03 Today's Date: 12/31/2023   History of present illness 83 y.o. male with medical history significant of seasonal allergies, arthritis, unspecified arrhythmia, coronary artery calcification, aortic atherosclerosis, hypothyroidism, rotator cuff tear, sleep apnea, EtOH abuse, alcoholic cirrhosis, history of esophageal who was just recently discharged after being admitted for acute respiratory failure with hypoxia in the setting of community-acquired pneumonia and elevated troponin who is now here due to episodes of melanotic stools over the past 3-4 weeks.   OT comments  Pt seen for OT tx. Pt eating lunch, agreeable to session. OT facilitated problem solving for ADL/IADL participation and safety with emphasis on instruction in ECS including activity pacing, work simplification, AE/DME, and prioritizing better management of energy use across the day to prevent over exertion, increased falls risk, or SOB. Pt verbalized understanding. Pt completed bed mobility with supv/increased time/effort but no direct assist and stood with supervision EOB to apply pillow underneath his buttocks for improved comfort while seated EOB for additional meal intake. Educated in benefits of repositioning and sitting EOB for ADL like grooming and meals to improve overall activity tolerance. Pt verbalized understanding. Left with PT for additional therapy.       If plan is discharge home, recommend the following:  A little help with walking and/or transfers;A little help with bathing/dressing/bathroom;Assistance with cooking/housework   Equipment Recommendations  None recommended by OT    Recommendations for Other Services      Precautions / Restrictions Precautions Precautions: Fall Recall of Precautions/Restrictions: Intact Restrictions Weight Bearing Restrictions Per Provider Order: No       Mobility Bed  Mobility Overal bed mobility: Modified Independent Bed Mobility: Supine to Sit           General bed mobility comments: increased time/effort but no direct assist required    Transfers Overall transfer level: Needs assistance Equipment used: None Transfers: Sit to/from Stand Sit to Stand: Supervision           General transfer comment: STS EOB with no LOB     Balance Overall balance assessment: Needs assistance, History of Falls Sitting-balance support: Feet supported Sitting balance-Leahy Scale: Normal     Standing balance support: No upper extremity supported Standing balance-Leahy Scale: Fair                             ADL either performed or assessed with clinical judgement   ADL                                              Extremity/Trunk Assessment              Vision       Perception     Praxis     Communication     Cognition Arousal: Alert Behavior During Therapy: WFL for tasks assessed/performed Cognition: No apparent impairments                               Following commands: Intact        Cueing   Cueing Techniques: Verbal cues  Exercises Other Exercises Other Exercises: OT facilitated problem solving for ADL/IADL participation and safety with emphasis on instruction in ECS including activity pacing, work simplification,  AE/DME, and prioritizing better management of energy use across the day to prevent over exertion, increased falls risk, or SOB. Pt verbalized understanding.    Shoulder Instructions       General Comments      Pertinent Vitals/ Pain       Pain Assessment Pain Assessment: No/denies pain  Home Living                                          Prior Functioning/Environment              Frequency  Min 2X/week        Progress Toward Goals  OT Goals(current goals can now be found in the care plan section)  Progress towards OT goals:  Progressing toward goals  Acute Rehab OT Goals Patient Stated Goal: go home OT Goal Formulation: With patient/family Time For Goal Achievement: 01/12/24 Potential to Achieve Goals: Good  Plan      Co-evaluation                 AM-PAC OT 6 Clicks Daily Activity     Outcome Measure   Help from another person eating meals?: None Help from another person taking care of personal grooming?: None Help from another person toileting, which includes using toliet, bedpan, or urinal?: A Little Help from another person bathing (including washing, rinsing, drying)?: A Little Help from another person to put on and taking off regular upper body clothing?: A Little Help from another person to put on and taking off regular lower body clothing?: A Little 6 Click Score: 20    End of Session    OT Visit Diagnosis: Muscle weakness (generalized) (M62.81);History of falling (Z91.81)   Activity Tolerance Patient tolerated treatment well   Patient Left in bed;with call bell/phone within reach;Other (comment) (seated EOB with PT)   Nurse Communication          Time: 8584-8570 OT Time Calculation (min): 14 min  Charges: OT General Charges $OT Visit: 1 Visit OT Treatments $Self Care/Home Management : 8-22 mins  Warren SAUNDERS., MPH, MS, OTR/L ascom [phone] 12/31/23, 3:00 PM

## 2023-12-31 NOTE — Plan of Care (Signed)
 Patient noted to be resting in bed.  Remains free from any noted signs of acute distress.  Has not required any additional medical interventions.  Patient to continue to be monitored by hospital staff until discharge.

## 2023-12-31 NOTE — Progress Notes (Signed)
 Progress Note    12/31/2023 10:47 AM 5 Days Post-Op  Subjective:  Tony Luna is an 83 yo male who presented to [facility] emergency department with black tarry stools and generalized weakness.  Patient was seen in the hospital prior on 726 by Dr. Onita of gastroenterology.  Per that note the patient has history of alcohol abuse and compensated cirrhosis with hypothyroidism.  Upon workup he underwent upper endoscopy. He was noted to have a large duodenal ulcer with a visible vessel actively bleeding in the duodenum.  This was extensively cauterized and injected with epinephrine  to the area stopping the bleeding at that time.  However he had a drop in hemoglobin overnight.  His highest at 8 4 dropped to 8.0 and then down to 7.5.  Vascular surgery was consulted to evaluate.  At this point we elected to observe.  Patient's hemoglobin has risen since that time.   Vitals:   12/31/23 0426 12/31/23 0758  BP: 125/74 128/73  Pulse: 98 86  Resp: 20 18  Temp: 97.8 F (36.6 C) 98 F (36.7 C)  SpO2: 97% 95%   Physical Exam: Cardiac:  RRR, normal S1 and S2 no murmurs. Lungs: Lungs clear throughout on auscultation.  No rales rhonchi or wheezing. Incisions: none Extremities: Palpable pulses throughout.  All extremities warm to touch. Abdomen: Positive bowel sounds throughout, soft, nontender nondistended. Neurologic: Alert and oriented x 3, answers all questions follows commands appropriately.  CBC    Component Value Date/Time   WBC 5.8 12/31/2023 0450   RBC 2.76 (L) 12/31/2023 0450   HGB 9.9 (L) 12/31/2023 0450   HGB 13.5 01/21/2023 0950   HCT 28.9 (L) 12/31/2023 0450   PLT 143 (L) 12/31/2023 0450   PLT 119 (L) 01/21/2023 0950   MCV 104.7 (H) 12/31/2023 0450   MCH 35.9 (H) 12/31/2023 0450   MCHC 34.3 12/31/2023 0450   RDW 17.0 (H) 12/31/2023 0450   LYMPHSABS 0.4 (L) 12/20/2023 1054   MONOABS 0.4 12/20/2023 1054   EOSABS 0.0 12/20/2023 1054   BASOSABS 0.0 12/20/2023 1054    BMET     Component Value Date/Time   NA 133 (L) 12/31/2023 0450   NA 134 (A) 04/30/2019 0000   NA 142 04/30/2019 0000   K 3.9 12/31/2023 0450   CL 107 12/31/2023 0450   CO2 23 12/31/2023 0450   GLUCOSE 100 (H) 12/31/2023 0450   BUN 14 12/31/2023 0450   BUN 21 04/30/2019 0000   CREATININE 0.59 (L) 12/31/2023 0450   CALCIUM 7.5 (L) 12/31/2023 0450   GFRNONAA >60 12/31/2023 0450   GFRAA 90 04/30/2019 0000    INR    Component Value Date/Time   INR 1.3 (H) 12/26/2023 1142     Intake/Output Summary (Last 24 hours) at 12/31/2023 1047 Last data filed at 12/31/2023 1000 Gross per 24 hour  Intake 340 ml  Output 400 ml  Net -60 ml     Assessment/Plan:  83 y.o. male is s/p upper endoscopy by gastroenterology.  5 Days Post-Op   Patient underwent upper endoscopy by gastroenterology.  He was found to have a large duodenal ulcer that was cauterized and injected with epinephrine .  There was question of whether or not the patient continue to bleed after that procedure due to dark tarry stools.  We continue to monitor and patient's hemoglobin has risen since its lowest point.  Patient's hemoglobin over the last 4 days has been stable at 10.  Therefore at this time vascular surgery will sign  off as there is no need for any type of intervention or embolization of GI bleed.   DVT prophylaxis: None   Gwendlyn JONELLE [facility]ank [facility] 12/31/2023 10:47 AM

## 2023-12-31 NOTE — Plan of Care (Signed)

## 2023-12-31 NOTE — TOC Progression Note (Signed)
 Transition of Care [facility]) - Progression Note    Patient Details  Name: Tony Luna MRN: [id number] Date of Birth: [birth year]  Transition of Care [facility]) CM/SW Contact  Dalia GORMAN Fuse, RN Phone Number: 12/31/2023, 2:29 PM  Clinical Narrative:    TOC spoke with the patient in his room. His preference is to return to his home in [facility] and participate in outpatient rehab at the facility. TOC outreached to [name] Ouch at [facility] and an order will be needed for Outpatient PT/OT at the facility. TOC spoke with the hospitalist who agreed to enter the order. TOC  outreached to Shaun with [facility] to make him aware that the patient will receive Outpatient PT/OT, but will still need [facility] RN.  TOC outreached to [employer] at [facility] to get info for transportation, transportation wont be able to transport the patient after 6:00 pm. TOC made the MD aware. The plan is to discharge the patient tomorrow.    Expected Discharge Plan: Home w Home Health Services Barriers to Discharge: Continued Medical Work up               Expected Discharge Plan and Services   Discharge Planning Services: CM Consult Post Acute Care Choice: Home Health Living arrangements for the past 2 months: Independent Living Facility                                       Social Drivers of Health (SDOH) Interventions SDOH Screenings   Food Insecurity: No Food Insecurity (12/26/2023)  Housing: Low Risk  (12/26/2023)  Transportation Needs: No Transportation Needs (12/26/2023)  Utilities: Not At Risk (12/26/2023)  Alcohol Screen: Low Risk  (07/03/2023)  Depression (PHQ2-9): Low Risk  (12/13/2023)  Financial Resource Strain: Low Risk  (07/03/2023)  Physical Activity: Sufficiently Active (07/03/2023)  Social Connections: Moderately Integrated (12/26/2023)  Stress: No Stress Concern Present (07/03/2023)  Recent Concern: Stress - Stress Concern Present (05/19/2023)  Tobacco Use: High Risk (12/26/2023)  Health  Literacy: Adequate Health Literacy (07/03/2023)    Readmission Risk Interventions     No data to display

## 2024-01-01 ENCOUNTER — Telehealth: Payer: Self-pay | Admitting: Student

## 2024-01-01 DIAGNOSIS — D539 Nutritional anemia, unspecified: Secondary | ICD-10-CM | POA: Diagnosis not present

## 2024-01-01 DIAGNOSIS — D696 Thrombocytopenia, unspecified: Secondary | ICD-10-CM | POA: Diagnosis not present

## 2024-01-01 DIAGNOSIS — R2689 Other abnormalities of gait and mobility: Secondary | ICD-10-CM | POA: Diagnosis not present

## 2024-01-01 DIAGNOSIS — L89151 Pressure ulcer of sacral region, stage 1: Secondary | ICD-10-CM | POA: Diagnosis not present

## 2024-01-01 DIAGNOSIS — K269 Duodenal ulcer, unspecified as acute or chronic, without hemorrhage or perforation: Secondary | ICD-10-CM | POA: Diagnosis not present

## 2024-01-01 DIAGNOSIS — K6289 Other specified diseases of anus and rectum: Secondary | ICD-10-CM | POA: Diagnosis not present

## 2024-01-01 DIAGNOSIS — D489 Neoplasm of uncertain behavior, unspecified: Secondary | ICD-10-CM | POA: Diagnosis not present

## 2024-01-01 DIAGNOSIS — L89302 Pressure ulcer of unspecified buttock, stage 2: Secondary | ICD-10-CM | POA: Diagnosis not present

## 2024-01-01 DIAGNOSIS — R17 Unspecified jaundice: Secondary | ICD-10-CM | POA: Diagnosis not present

## 2024-01-01 DIAGNOSIS — K921 Melena: Secondary | ICD-10-CM | POA: Diagnosis not present

## 2024-01-01 DIAGNOSIS — N401 Enlarged prostate with lower urinary tract symptoms: Secondary | ICD-10-CM | POA: Diagnosis not present

## 2024-01-01 DIAGNOSIS — E039 Hypothyroidism, unspecified: Secondary | ICD-10-CM | POA: Diagnosis not present

## 2024-01-01 DIAGNOSIS — Z9181 History of falling: Secondary | ICD-10-CM | POA: Diagnosis not present

## 2024-01-01 DIAGNOSIS — K7030 Alcoholic cirrhosis of liver without ascites: Secondary | ICD-10-CM | POA: Diagnosis not present

## 2024-01-01 DIAGNOSIS — K7460 Unspecified cirrhosis of liver: Secondary | ICD-10-CM | POA: Diagnosis not present

## 2024-01-01 DIAGNOSIS — K760 Fatty (change of) liver, not elsewhere classified: Secondary | ICD-10-CM | POA: Diagnosis not present

## 2024-01-01 DIAGNOSIS — N138 Other obstructive and reflux uropathy: Secondary | ICD-10-CM | POA: Diagnosis not present

## 2024-01-01 DIAGNOSIS — D62 Acute posthemorrhagic anemia: Secondary | ICD-10-CM | POA: Diagnosis not present

## 2024-01-01 DIAGNOSIS — R7989 Other specified abnormal findings of blood chemistry: Secondary | ICD-10-CM | POA: Diagnosis not present

## 2024-01-01 DIAGNOSIS — D649 Anemia, unspecified: Secondary | ICD-10-CM | POA: Diagnosis present

## 2024-01-01 DIAGNOSIS — M6281 Muscle weakness (generalized): Secondary | ICD-10-CM | POA: Diagnosis not present

## 2024-01-01 DIAGNOSIS — E43 Unspecified severe protein-calorie malnutrition: Secondary | ICD-10-CM | POA: Diagnosis not present

## 2024-01-01 DIAGNOSIS — R2681 Unsteadiness on feet: Secondary | ICD-10-CM | POA: Diagnosis not present

## 2024-01-01 LAB — BASIC METABOLIC PANEL WITH GFR
Anion gap: 4 — ABNORMAL LOW (ref 5–15)
BUN: 19 mg/dL (ref 8–23)
CO2: 25 mmol/L (ref 22–32)
Calcium: 7.7 mg/dL — ABNORMAL LOW (ref 8.9–10.3)
Chloride: 106 mmol/L (ref 98–111)
Creatinine, Ser: 0.69 mg/dL (ref 0.61–1.24)
GFR, Estimated: >60 mL/min (ref >60)
Glucose, Bld: 118 mg/dL — ABNORMAL HIGH (ref 70–99)
Potassium: 3.5 mmol/L (ref 3.5–5.1)
Sodium: 135 mmol/L (ref 135–145)

## 2024-01-01 LAB — HEPATIC FUNCTION PANEL
ALT: 90 U/L — ABNORMAL HIGH (ref 0–44)
AST: 134 U/L — ABNORMAL HIGH (ref 15–41)
Albumin: 1.8 g/dL — ABNORMAL LOW (ref 3.5–5.0)
Alkaline Phosphatase: 193 U/L — ABNORMAL HIGH (ref 38–126)
Bilirubin, Direct: 2.8 mg/dL — ABNORMAL HIGH (ref 0.0–0.2)
Indirect Bilirubin: 2.4 mg/dL — ABNORMAL HIGH (ref 0.3–0.9)
Total Bilirubin: 5.2 mg/dL — ABNORMAL HIGH (ref 0.0–1.2)
Total Protein: 4.8 g/dL — ABNORMAL LOW (ref 6.5–8.1)

## 2024-01-01 LAB — CBC
HCT: 29.3 % — ABNORMAL LOW (ref 39.0–52.0)
Hemoglobin: 9.9 g/dL — ABNORMAL LOW (ref 13.0–17.0)
MCH: 35.7 pg — ABNORMAL HIGH (ref 26.0–34.0)
MCHC: 33.8 g/dL (ref 30.0–36.0)
MCV: 105.8 fL — ABNORMAL HIGH (ref 80.0–100.0)
Platelets: 144 K/uL — ABNORMAL LOW (ref 150–400)
RBC: 2.77 MIL/uL — ABNORMAL LOW (ref 4.22–5.81)
RDW: 16.8 % — ABNORMAL HIGH (ref 11.5–15.5)
WBC: 6.7 K/uL (ref 4.0–10.5)
nRBC: 0 % (ref 0.0–0.2)

## 2024-01-01 LAB — PHOSPHORUS: Phosphorus: 2.9 mg/dL (ref 2.5–4.6)

## 2024-01-01 LAB — MAGNESIUM: Magnesium: 1.7 mg/dL (ref 1.7–2.4)

## 2024-01-01 MED ORDER — PANTOPRAZOLE SODIUM 40 MG PO TBEC: DELAYED_RELEASE_TABLET | ORAL | Status: DC

## 2024-01-01 MED ORDER — SUCRALFATE 1 GM/10ML PO SUSP: 1.0000 g | Freq: Three times a day (TID) | ORAL | Status: DC

## 2024-01-01 MED ORDER — ASCORBIC ACID 500 MG PO TABS: 500.0000 mg | ORAL_TABLET | Freq: Every day | ORAL | Status: DC

## 2024-01-01 NOTE — TOC Progression Note (Signed)
 Transition of Care [facility]) - Progression Note    Patient Details  Name: Tony Luna MRN: [id number] Date of Birth: 1940/08/28  Transition of Care [facility]) CM/SW Contact  Dalia GORMAN Fuse, RN Phone Number: 01/01/2024, 8:40 AM  Clinical Narrative:    TOC spoke with the patient's daughter [name], the patient and family are choosing SNF at [facility]. TOC explained the patient ambulated household distances with therapy and is not currently receiving IV abx or have complex wound care needs. TOC explained that insurance likely wont cover a SNF for STR because the patient doesn't have skilled needs. The needs she described are custodial in nature. TOC offered the number for [facility] to assist with additional supports. The family chooses for the patient to private pay at [facility] for STR.  TOC spoke with [name] at [facility] and she will arrange for transport once we have a discharge time.    Expected Discharge Plan: Home w Home Health Services Barriers to Discharge: Continued Medical Work up               Expected Discharge Plan and Services   Discharge Planning Services: CM Consult Post Acute Care Choice: Home Health Living arrangements for the past 2 months: Independent Living Facility                                       Social Drivers of Health (SDOH) Interventions SDOH Screenings   Food Insecurity: No Food Insecurity (12/26/2023)  Housing: Low Risk  (12/26/2023)  Transportation Needs: No Transportation Needs (12/26/2023)  Utilities: Not At Risk (12/26/2023)  Alcohol Screen: Low Risk  (07/03/2023)  Depression (PHQ2-9): Low Risk  (12/13/2023)  Financial Resource Strain: Low Risk  (07/03/2023)  Physical Activity: Sufficiently Active (07/03/2023)  Social Connections: Moderately Integrated (12/26/2023)  Stress: No Stress Concern Present (07/03/2023)  Recent Concern: Stress - Stress Concern Present (05/19/2023)  Tobacco Use: High Risk (12/26/2023)   Health Literacy: Adequate Health Literacy (07/03/2023)    Readmission Risk Interventions     No data to display

## 2024-01-01 NOTE — Progress Notes (Deleted)
 PHARMACIST - PHYSICIAN COMMUNICATION  CONCERNING: IV to Oral Route Change Policy  RECOMMENDATION: This patient is receiving pantoprazole  by the intravenous route.  Based on criteria approved by the Pharmacy and Therapeutics Committee, the intravenous medication(s) is/are being converted to the equivalent oral dose form(s).  DESCRIPTION: These criteria include: The patient is eating (either orally or via tube) and/or has been taking other orally administered medications for a least 24 hours The patient has no evidence of active gastrointestinal bleeding or impaired GI absorption (gastrectomy, short bowel, patient on TNA or NPO).  If you have questions about this conversion, please contact the Pharmacy Department  []   [phone] )  [facility] [x]   [phone] )  [facility] []   [phone] )  [facility] []   [phone] )  [facility] []   [phone] )  [facility]    Thank you for involving pharmacy in this patient's care.   Damien Napoleon, PharmD Clinical Pharmacist 01/01/2024 10:26 AM

## 2024-01-01 NOTE — TOC Transition Note (Signed)
 Transition of Care [facility]) - Discharge Note   Patient Details  Name: Tony Luna MRN: [id number] Date of Birth: 09-30-1940  [facility] Care [facility]) CM/SW Contact:  Dalia GORMAN Fuse, RN Phone Number: 01/01/2024, 11:00 AM   Clinical Narrative:    The patient is medically clear to discharge to [facility] for [employer] (private pay). [facility] will transport the patient and will be here around 2:00 PM to pick him up. The patient and his daughter [name] are agreeable with the discharge plan.   Final next level of care: Skilled Nursing Facility Barriers to Discharge: Barriers Resolved   Patient Goals and CMS Choice     Choice offered to / list presented to : Patient      Discharge Placement              Patient chooses bed at: [facility] Patient to be transferred to facility by: [facility] Additional resources added to the After Visit Summary for     Discharge Planning Services: CM Consult Post Acute Care Choice: Home Health                               Social Drivers of Health (SDOH) Interventions SDOH Screenings   Food Insecurity: No Food Insecurity (12/26/2023)  Housing: Low Risk  (12/26/2023)  Transportation Needs: No Transportation Needs (12/26/2023)  Utilities: Not At Risk (12/26/2023)  Alcohol Screen: Low Risk  (07/03/2023)  Depression (PHQ2-9): Low Risk  (12/13/2023)  Financial Resource Strain: Low Risk  (07/03/2023)  Physical Activity: Sufficiently Active (07/03/2023)  Social Connections: Moderately Integrated (12/26/2023)  Stress: No Stress Concern Present (07/03/2023)  Recent Concern: Stress - Stress Concern Present (05/19/2023)  Tobacco Use: High Risk (12/26/2023)  Health Literacy: Adequate Health Literacy (07/03/2023)     Readmission Risk Interventions     No data to display

## 2024-01-01 NOTE — Plan of Care (Signed)

## 2024-01-01 NOTE — Discharge Summary (Signed)
 [facility] Discharge Summary   Patient: Tony Luna FMW:[id number]  PCP: Marylynn Verneita CROME, MD  Date of admission: 12/26/2023   Date of discharge:  01/01/2024     Discharge Diagnoses:  Principal Problem:   Melena Active Problems:   Thrombocytopenia (HCC)   Benign prostatic hyperplasia with urinary obstruction   Transaminitis   Alcohol use disorder   ABLA (acute blood loss anemia)   Macrocytic anemia   Coronary artery calcification   Aortic atherosclerosis (HCC)   Protein-calorie malnutrition, severe (HCC)   Hyperbilirubinemia   Coagulopathy (HCC)   UGI bleed   Alcoholic cirrhosis (HCC)   Pressure injury of sacral region, stage 1   Aortic regurgitation   Aortic stenosis   Hypokalemia   Admitted From: SNF Disposition:  SNF   Recommendations for Outpatient Follow-up:  Follow-up with the PCP in 1 week, you also have monitor BP and titrate medications accordingly. Follow-up follow-up with GI to evaluate for continued ulcer and liver cirrhosis management as an outpatient Follow up LABS/TEST:  Repeat BMP, LFTs and CBC after 1 week   Follow-up Information     Marylynn Verneita CROME, MD Follow up.   Specialty: Internal Medicine Why: hospital follow up Contact information: [address] [place] [address] [phone]                Diet recommendation: Cardiac diet  Activity: The patient is advised to gradually reintroduce usual activities, as tolerated  Discharge Condition: stable  Code Status: DNR -Limited  History of present illness: As per the H and P dictated on admission  Hospital Course:  83 y.o. male with medical history significant of seasonal allergies, arthritis, unspecified arrhythmia, coronary artery calcification, aortic atherosclerosis, hypothyroidism, rotator cuff tear, sleep apnea, EtOH abuse, alcoholic cirrhosis, history of esophageal who was discharged 4 days ago after being admitted for acute respiratory failure with hypoxia  in the setting of community-acquired pneumonia and elevated troponin who is coming to the emergency department due to episodes of melanotic stools over the past 3-4 weeks.  He has mild postural lightheadedness. He has been sleeping more than usual and reports feeling more fatigue.  No abdominal pain, nausea, emesis, diarrhea or constipation. No flank pain, dysuria, frequency or hematuria.He denied fever, chills, rhinorrhea, sore throat, wheezing or hemoptysis. No chest pain, palpitations, diaphoresis, PND, orthopnea or pitting edema of the lower extremities. No polyuria, polydipsia, polyphagia or blurred vision.     Lab work: CBC showed a white count of 6.5, hemoglobin 8.0 g/dL (8.8 g/dL 4 days ago and 87.7 g/dL 13 days ago), MCV 888.8 fL and platelets 131.  B12 level and folate level were normal 3 months ago.  PT 17.4 and INR 1.3.  CMP showed 737, potassium 3.3, chloride 106 and CO2 23 mmol/L with a normal anion gap.  Glucose 114, BUN 39, creatinine 0.79 and calcium 9.0 (corrected to albumin level 10.7 mg/dL).  Total protein 4.8 and albumin 1.9 g/dL.  AST 853, ALT 888, alkaline phosphatase 162 units/L and total bilirubin 6.1 mg/dL.   ED course: Initial vital signs were temperature 98 F, pulse 100, respiration 18, BP 127/67 mmHg O2 sat 96% on room air.  The patient received ceftriaxone  1 g IVPB, pantoprazole  80 mg IVP and vancomycin  1500 mg IVPB.     Assessment and Plan:   # Melena /UGI bleed In the setting nq:Jornynopr cirrhosis Resulting pw:JAOJ (acute blood loss anemia) Macrocytic anemia.  s/p IV fluids, and IV PPI. Normal B12 and folate recently. -  Underwent EGD was found to have an ulcer.  Ulcer was cauterized.  Vascular surgery consulted for possible embolization if patient continues to bleed. Continue pantoprazole  40 mg PO twice daily for 30 days and then 40 mg po daily.  8/2 started Carafate , continue for 30 days as well.   8/2 Hb 6.9, transfuse 1 unit of PRBC.  Monitor H&H 8/3 Hb 9.7,  stable, received transfusion on 8/2.  No active bleeding 8/6 Hb 9.9, stable, no active bleeding.  Patient is stable for discharge.  Repeat CBC after 1 week.     # Alcohol use disorder: Has ceased alcohol for the past week. No withdrawal symptoms. Mag sulfate 2 g IVPB x 1.   # Alcoholic cirrhosis, Associated with: Hyperbilirubinemia, Transaminitis And   Coagulopathy. LFTs remained stable.  Recommended to follow-up with GI as an outpatient for further management.   # Thrombocytopenia: In the setting of cirrhosis. Stable    # Pressure injury of sacral region, stage 1  S/p ceftriaxone  1 g IVPB for possible infection.  Continue prophylactic pressure relieving measures. No more need of antibiotics  # Hypokalemia.  Potassium repleted and resolved # Hypomagnesemia, mag repleted.  Resolved # Hypophosphatemia, Phos repleted.  Resolved  # Coronary artery calcification # Aortic atherosclerosis  No angina, palpitations or diaphoresis. No recent complaints of lower extremity edema.   Aortic regurgitation, Aortic stenosis  Follow-up with cardiology as needed.   Benign prostatic hyperplasia with urinary obstruction Continue tamsulosin  home dose    Protein-calorie malnutrition, severe  In the setting of anemia and cirrhosis. May benefit from protein supplementation. Consider nutritional services evaluation. Albumin level 1.9, low as expected Continue alcohol cessation.   Body mass index is 21.02 kg/m.  Nutrition Interventions:  - Patient was instructed, not to drive, operate heavy machinery, perform activities at heights, swimming or participation in water  activities or provide baby sitting services while on Pain, Sleep and Anxiety Medications; until his outpatient Physician has advised to do so again.  - Also recommended to not to take more than prescribed Pain, Sleep and Anxiety Medications.  Patient was seen by physical therapy, who recommended Therapy, which was arranged. On the  day of the discharge the patient's vitals were stable, and no other acute medical condition were reported by patient. the patient was felt safe to be discharge at SNF with Therapy.  Consultants: GI and vascular surgery Procedures: EGD s/p Portal hypertensive gastropathy.                        - Oozing duodenal ulcers with a visible vessel.                         Injected. Treated with bipolar cautery.  Discharge Exam: General: Appear in no distress, no Rash; Oral Mucosa Clear, moist. Cardiovascular: S1 and S2 Present, no Murmur, Respiratory: normal respiratory effort, Bilateral Air entry present and no Crackles, no wheezes Abdomen: Bowel Sound present, Soft and no tenderness, no hernia Extremities: no Pedal edema, no calf tenderness Neurology: alert and oriented to time, place, and person affect appropriate.  Filed Weights   12/26/23 1031 12/26/23 1351  Weight: 70.3 kg 70.3 kg   Vitals:   01/01/24 0352 01/01/24 0740  BP: 103/71 129/81  Pulse: (!) 108 (!) 101  Resp:  16  Temp: 98.2 F (36.8 C) 98.6 F (37 C)  SpO2: 95% 94%    DISCHARGE MEDICATION: Allergies as of 01/01/2024  No Known Allergies      Medication List     STOP taking these medications    amoxicillin -clavulanate 875-125 MG tablet Commonly known as: AUGMENTIN    azithromycin  250 MG tablet Commonly known as: Zithromax  Z-Pak   loperamide  2 MG capsule Commonly known as: IMODIUM    sertraline  50 MG tablet Commonly known as: ZOLOFT        TAKE these medications    ascorbic acid  500 MG tablet Commonly known as: VITAMIN C  Take 1 tablet (500 mg total) by mouth daily. Start taking on: January 02, 2024   Boron 6 MG Tabs Take by mouth daily.   clotrimazole-betamethasone cream Commonly known as: LOTRISONE Apply topically.   mirtazapine  7.5 MG tablet Commonly known as: REMERON  Take 1 tablet (7.5 mg total) by mouth at bedtime.   multivitamin with minerals Tabs tablet Take 1 tablet by mouth 2 (two)  times daily.   mupirocin ointment 2 % Commonly known as: BACTROBAN SMARTSIG:1 Application Topical 2-3 Times Daily   OVER THE COUNTER MEDICATION Take 20,000 mcg by mouth daily. Vitamin B12-methylfolate   pantoprazole  40 MG tablet Commonly known as: Protonix  Take 1 tablet (40 mg total) by mouth 2 (two) times daily for 30 days, THEN 1 tablet (40 mg total) daily. Start taking on: January 01, 2024   sucralfate  1 GM/10ML suspension Commonly known as: CARAFATE  Take 10 mLs (1 g total) by mouth 4 (four) times daily -  with meals and at bedtime.   tadalafil  20 MG tablet Commonly known as: CIALIS  TAKE ONE TABLET DAILY AS NEEDED FOR ERECTILE DYSFUNCTION   tamsulosin  0.4 MG Caps capsule Commonly known as: FLOMAX  Take 2 capsules (0.8 mg total) by mouth daily.   Testosterone  75 MG Pllt by Implant route.   thyroid  60 MG tablet Commonly known as: NP Thyroid  Take 1 tablet (60 mg total) by mouth daily.   Zinc  Oxide 10 % Oint Apply twice daily to sacral decubitus ulcer               Discharge Care Instructions  (From admission, onward)           Start     Ordered   01/01/24 0000  Discharge wound care:       Comments: As above   01/01/24 1042           No Known Allergies Discharge Instructions     Ambulatory referral to Physical Therapy   Complete by: As directed    Call MD for:   Complete by: As directed    GI bleeding   Call MD for:  difficulty breathing, headache or visual disturbances   Complete by: As directed    Call MD for:  extreme fatigue   Complete by: As directed    Call MD for:  persistant dizziness or light-headedness   Complete by: As directed    Call MD for:  persistant nausea and vomiting   Complete by: As directed    Call MD for:  severe uncontrolled pain   Complete by: As directed    Call MD for:  temperature >100.4   Complete by: As directed    Diet - low sodium heart healthy   Complete by: As directed    Discharge instructions   Complete  by: As directed    Follow-up with the PCP in 1 week, you also have monitor BP and titrate medications accordingly. Repeat BMP and CBC after 1 week Follow-up follow-up with GI to evaluate for continued ulcer and liver  cirrhosis management as an outpatient   Discharge wound care:   Complete by: As directed    As above   Increase activity slowly   Complete by: As directed        The results of significant diagnostics from this hospitalization (including imaging, microbiology, ancillary and laboratory) are listed below for reference.    Significant Diagnostic Studies: DG Chest Port 1 View Result Date: 12/26/2023 CLINICAL DATA:  Hypoxia. EXAM: PORTABLE CHEST 1 VIEW COMPARISON:  Chest radiograph dated 12/20/2023. FINDINGS: Shallow inspiration. Bilateral lower lung field opacities, new since the prior radiograph most consistent with pneumonia. No pleural effusion or pneumothorax. Biapical calcified plaques. Stable cardiac silhouette no acute osseous pathology. IMPRESSION: Bilateral lower lung field pneumonia. Electronically Signed   By: Vanetta Chou M.D.   On: 12/26/2023 16:21   CT Chest Wo Contrast Result Date: 12/20/2023 CLINICAL DATA:  Respiratory illness. EXAM: CT CHEST WITHOUT CONTRAST TECHNIQUE: Multidetector CT imaging of the chest was performed following the standard protocol without IV contrast. RADIATION DOSE REDUCTION: This exam was performed according to the departmental dose-optimization program which includes automated exposure control, adjustment of the mA and/or kV according to patient size and/or use of iterative reconstruction technique. COMPARISON:  Chest radiograph dated 12/20/2023. FINDINGS: Evaluation of this exam is limited in the absence of intravenous contrast. Cardiovascular: There is no cardiomegaly or pericardial effusion. There is 3 vessel coronary vascular calcification. Mild atherosclerotic calcification of the thoracic aorta. No evidence of bowel dilatation. The  central pulmonary arteries are grossly unremarkable. Mediastinum/Nodes: No hilar or mediastinal adenopathy. The esophagus is grossly unremarkable. No mediastinal fluid collection. Lungs/Pleura: Mild bilateral lower lobe bronchiectasis. Diffuse ground-glass density in the lower lobes and lower lung fields as well as interstitial and subpleural densities likely represent pneumonia. Underlying interstitial lung disease is not excluded but favored less likely. Biapical calcified pleural plaques. Trace left pleural effusion. No pneumothorax. Upper Abdomen: Fatty liver and cirrhosis. A 3 mm nonobstructing left renal stone. Musculoskeletal: Degenerative changes of the spine. No acute osseous pathology. IMPRESSION: 1. Bilateral lower lobe and lower lung field predominant densities likely represent pneumonia. Clinical correlation and follow-up recommended. 2. Fatty liver and cirrhosis. 3. A 3 mm nonobstructing left renal stone. 4.  Aortic Atherosclerosis (ICD10-I70.0). Electronically Signed   By: Vanetta Chou M.D.   On: 12/20/2023 14:20   DG Chest 2 View Result Date: 12/20/2023 CLINICAL DATA:  Shortness of breath, liver failure EXAM: CHEST - 2 VIEW COMPARISON:  12/13/2023 FINDINGS: Lower thoracic spondylosis. Atherosclerotic calcification of the aortic arch. Biapical pleuroparenchymal scarring with associated calcifications. Reticular interstitial accentuation especially posteriorly in the lower lobes, although judged to be mildly improved from 12/13/2023. IMPRESSION: 1. Reticular interstitial accentuation especially posteriorly in the lower lobes, although judged to be mildly improved from 12/13/2023. This could reflect resolving noncardiogenic edema or resolving infection. 2. Lower thoracic spondylosis. 3. Aortic Atherosclerosis (ICD10-I70.0). Electronically Signed   By: Ryan Salvage M.D.   On: 12/20/2023 13:15   CT ABDOMEN PELVIS W CONTRAST Result Date: 12/13/2023 EXAM: CT ABDOMEN AND PELVIS WITH CONTRAST  12/13/2023 11:11:54 PM TECHNIQUE: CT of the abdomen and pelvis was performed with the administration of intravenous contrast. Multiplanar reformatted images are provided for review. Automated exposure control, iterative reconstruction, and/or weight based adjustment of the mA/kV was utilized to reduce the radiation dose to as low as reasonably achievable. COMPARISON: MR abdomen dated 04/08/2022. CLINICAL HISTORY: Elevated LFTs, concerns for mass. Pt reports having bloodwork done today and was sent over by Doctor Tullo  for abnormal results, pt unable to recall results. Pt reports having sore on his bottom x 2 months. Pt reports weakness x 1 week. Denies CP or SOB. FINDINGS: LOWER CHEST: Subpleural patchy opacities in the bilateral lower lobes favoring mild infection/pneumonia over postinfectious/inflammatory scarring. LIVER: Cirrhosis. Severe hepatic steatosis with scattered areas of focal fatty sparing, including in the posterior right hepatic lobe, although non-mass-like. GALLBLADDER AND BILE DUCTS: Gallbladder is unremarkable. No biliary ductal dilatation. SPLEEN: No acute abnormality. PANCREAS: No acute abnormality. ADRENAL GLANDS: No acute abnormality. KIDNEYS, URETERS AND BLADDER: No stones in the kidneys or ureters. No hydronephrosis. No perinephric or periureteral stranding. Urinary bladder is unremarkable. GI AND BOWEL: Stomach demonstrates no acute abnormality. There is no bowel obstruction. No bowel wall thickening. Scattered colonic diverticulosis, without evidence of diverticulitis. Appendix is not discretely visualized. PERITONEUM AND RETROPERITONEUM: No ascites. No free air. VASCULATURE: Atherosclerotic calcifications of the abdominal aorta and branch vessels, although patent. Portal vein is patent. LYMPH NODES: No lymphadenopathy. REPRODUCTIVE ORGANS: No acute abnormality. BONES AND SOFT TISSUES: No acute osseous abnormality. Small fat-containing right inguinal hernia. IMPRESSION: 1. Cirrhosis and  severe hepatic steatosis. No suspicious hepatic mass. 2. Subpleural patchy opacities in the bilateral lower lobes, favoring mild infection/pneumonia over postinfectious/inflammatory scarring. Electronically signed by: Pinkie Pebbles MD 12/13/2023 11:18 PM EDT RP Workstation: [id number]   DG Chest 1 View Result Date: 12/13/2023 EXAM: 1 VIEW XRAY OF THE CHEST 12/13/2023 09:49:41 PM COMPARISON: 11/04/2019 CLINICAL HISTORY: Weakness. Table formatting from the original note was not included. Pt reports having bloodwork done today and was sent over by Doctor Tullo for abnormal results, pt unable to recall results. Pt reports having sore on his bottom x 2 months. Pt reports weakness x 1 week. FINDINGS: LUNGS AND PLEURA: No focal pulmonary opacity. No pulmonary edema. No pleural effusion. No pneumothorax. HEART AND MEDIASTINUM: No acute abnormality of the cardiac and mediastinal silhouettes. BONES AND SOFT TISSUES: No acute osseous abnormality. IMPRESSION: 1. No acute process. Electronically signed by: Pinkie Pebbles MD 12/13/2023 10:00 PM EDT RP Workstation: [id number]   US  ABDOMEN LIMITED RUQ (LIVER/GB) Result Date: 12/13/2023 CLINICAL DATA:  Transaminitis. EXAM: ULTRASOUND ABDOMEN LIMITED RIGHT UPPER QUADRANT COMPARISON:  August 01, 2023 FINDINGS: Gallbladder: No gallstones or wall thickening visualized (2.4 mm). No sonographic Murphy sign noted by sonographer. Common bile duct: Diameter: 3.7 mm (partially visualized) Liver: No focal lesion identified. The liver parenchyma is coarse in echotexture and diffusely increased in echogenicity. Portal vein is patent on color Doppler imaging with normal direction of blood flow towards the liver. Other: None. IMPRESSION: Hepatic cirrhosis without focal liver lesions. Electronically Signed   By: Suzen Dials M.D.   On: 12/13/2023 21:48    Microbiology: No results found for this or any previous visit (from the past 240 hours).   Labs: CBC: Recent Labs  Lab  12/28/23 0544 12/28/23 1657 12/29/23 0515 12/29/23 1353 12/30/23 0549 12/31/23 0450 01/01/24 0423  WBC 3.8*  --  4.7  --  6.5 5.8 6.7  HGB 6.9*   < > 9.5* 9.7* 10.0* 9.9* 9.9*  HCT 20.6*   < > 28.0* 29.6* 30.5* 28.9* 29.3*  MCV 113.2*  --  104.5*  --  107.8* 104.7* 105.8*  PLT 130*  --  136*  --  160 143* 144*   < > = values in this interval not displayed.   Basic Metabolic Panel: Recent Labs  Lab 12/28/23 0544 12/29/23 0515 12/30/23 0549 12/31/23 0450 01/01/24 0423  NA 138 138 136 133* 135  K 3.7 3.5 3.8 3.9 3.5  CL 108 109 106 107 106  CO2 25 21* 21* 23 25  GLUCOSE 103* 101* 101* 100* 118*  BUN 20 15 14 14 19   CREATININE 0.79 0.67 0.62 0.59* 0.69  CALCIUM 7.4* 7.5* 7.5* 7.5* 7.7*  MG 1.7 1.7 1.6* 1.8 1.7  PHOS 2.0* 2.7 1.9* 2.2* 2.9   Liver Function Tests: Recent Labs  Lab 12/28/23 0544 12/29/23 0515 12/30/23 0549 12/31/23 0450 01/01/24 0423  AST 170* 182* 171* 156* 134*  ALT 99* 113* 108* 101* 90*  ALKPHOS 128* 153* 164* 162* 193*  BILITOT 5.4* 6.0* 6.3* 5.8* 5.2*  PROT 4.1* 5.3* 5.0* 5.2* 4.8*  ALBUMIN 1.7* 2.0* 1.9* 2.1* 1.8*   No results for input(s): LIPASE, AMYLASE in the last 168 hours. No results for input(s): AMMONIA in the last 168 hours. Cardiac Enzymes: No results for input(s): CKTOTAL, CKMB, CKMBINDEX, TROPONINI in the last 168 hours. BNP (last 3 results) No results for input(s): BNP in the last 8760 hours. CBG: Recent Labs  Lab 12/31/23 1130  GLUCAP 129*    Time spent: 35 minutes  Signed:  Elvan Sor  [facility] 01/01/2024 10:45 AM

## 2024-01-01 NOTE — Telephone Encounter (Signed)
 Patient to admit to facility today.

## 2024-01-01 NOTE — [place] FL2 (Addendum)
 [place]  MEDICAID FL2 LEVEL OF CARE FORM     IDENTIFICATION  Patient Name: Tony Luna Birthdate: November 10, 1940 Sex: male Admission Date (Current Location): 12/26/2023  [facility] and [place] Number:  Chiropodist and Address:  [facility], [address], [place], [place] [address]      Provider Number: [id number]  Attending Physician Name and Address:  Von Bellis, MD  Relative Name and Phone Number:  Debra Calabretta [phone]    Current Level of Care: Hospital Recommended Level of Care: Skilled Nursing Facility Prior Approval Number:    Date Approved/Denied:   PASRR Number:  [id number]  Discharge Plan: SNF    Current Diagnoses: Patient Active Problem List   Diagnosis Date Noted   Melena 12/26/2023   ABLA (acute blood loss anemia) 12/26/2023   Macrocytic anemia 12/26/2023   Coronary artery calcification 12/26/2023   Aortic atherosclerosis (HCC) 12/26/2023   Protein-calorie malnutrition, severe (HCC) 12/26/2023   Hyperbilirubinemia 12/26/2023   Coagulopathy (HCC) 12/26/2023   UGI bleed 12/26/2023   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   Pressure injury of sacral region, stage 1 12/26/2023   Aortic regurgitation 12/26/2023   Aortic stenosis 12/26/2023   Hypokalemia 12/26/2023   Acute hypoxic respiratory failure (HCC) 12/20/2023   CAP (community acquired pneumonia) 12/20/2023   LFT elevation 12/20/2023   Ecchymosis of eye, initial encounter 12/20/2023   Ecchymosis on examination 12/20/2023   Traumatic ecchymosis 12/20/2023   Traumatic ecchymosis of cheek 12/20/2023   SIRS due to infectious process with acute organ dysfunction (HCC) 12/20/2023   Elevated troponin 12/20/2023   Alcohol use disorder 12/20/2023   Lactic acid blood increased 12/20/2023   Pressure injury of skin 12/20/2023   Decubitus ulcer of right buttock 10/29/2023   Loss of balance 09/23/2023   Loose stools 08/07/2023   Elevated ferritin 07/26/2023    Transaminitis 07/26/2023   Polyp of ascending colon 04/24/2022   Iron overload 02/04/2022   Caregiver stress 02/02/2022   History of colonic polyps    Polyp of transverse colon    Encounter for preventative adult health care exam with abnormal findings 04/02/2020   Educated about COVID-19 virus infection 11/01/2018   Insomnia 11/01/2018   Prostate cancer screening 04/23/2017   Osteoporosis 03/29/2016   Stress fracture of metatarsal due to multiple or repetitive stress 01/08/2016   White coat syndrome with high blood pressure without hypertension 12/15/2015   Tubular adenoma of colon    Arthritis 04/13/2015   Body mass index (BMI) of 21.0 to 21.9 in adult 04/13/2015   Cirrhosis (HCC) 04/13/2015   Cardiac murmur 04/13/2015   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   Enlargement of spleen 04/13/2015   Thrombocytopenia (HCC) 04/13/2015   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/25/2015   ED (erectile dysfunction) of organic origin 03/25/2015   Knee osteoarthritis 06/22/2014   SCC (squamous cell carcinoma), 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   Disorder of bursae and tendons in shoulder region 08/31/2008   Osteoarthritis 03/03/2008   Post-traumatic osteoarthritis of right knee 03/03/2008    Orientation RESPIRATION BLADDER Height & Weight     Self, Time    Continent Weight: 70.3 kg Height:  6' (182.9 cm)  BEHAVIORAL SYMPTOMS/MOOD NEUROLOGICAL BOWEL NUTRITION STATUS      Continent Diet (Room Service)  AMBULATORY STATUS COMMUNICATION OF NEEDS Skin   Limited Assist Verbally PU Stage and Appropriate Care (R and L buttock stage III  R and L buttock stage II)   PU Stage 2 Dressing: Daily PU Stage 3 Dressing:  Daily                 Personal Care Assistance Level of Assistance  Feeding, Bathing, Dressing Bathing Assistance: Limited assistance   Dressing Assistance: Limited assistance     Functional Limitations Info  Hearing, Speech, Sight          SPECIAL CARE FACTORS FREQUENCY  PT  (By licensed PT), OT (By licensed OT)     PT Frequency: 2 x week OT Frequency: 2 x week            Contractures      Additional Factors Info  Code Status Code Status Info: DNR             Current Medications (01/01/2024):  This is the current hospital active medication list Current Facility-Administered Medications  Medication Dose Route Frequency Provider Last Rate Last Admin   0.9 %  sodium chloride  infusion (Manually program via Guardrails IV Fluids)   Intravenous Once Von Bellis, MD       ascorbic acid  (VITAMIN C ) tablet 500 mg  500 mg Oral Daily Von Bellis, MD   500 mg at 01/01/24 0854   cefTRIAXone  (ROCEPHIN ) 1 g in sodium chloride  0.9 % 100 mL IVPB  1 g Intravenous Q24H Celinda Alm Lot, MD   Stopped at 12/31/23 1905   leptospermum manuka honey (MEDIHONEY) paste 1 Application  1 Application Topical Daily Von Bellis, MD   1 Application at 01/01/24 [phone]   liver oil-zinc  oxide (DESITIN) 40 % ointment   Topical BID Von Bellis, MD   Given at 01/01/24 9144   mirtazapine  (REMERON ) tablet 7.5 mg  7.5 mg Oral QHS Von Bellis, MD   7.5 mg at 12/31/23 2144   ondansetron  (ZOFRAN ) tablet 4 mg  4 mg Oral Q6H PRN Celinda Alm Lot, MD       Or   ondansetron  (ZOFRAN ) injection 4 mg  4 mg Intravenous Q6H PRN Celinda Alm Lot, MD       pantoprazole  (PROTONIX ) injection 40 mg  40 mg Intravenous Q12H Celinda Alm Lot, MD   40 mg at 01/01/24 0854   sucralfate  (CARAFATE ) 1 GM/10ML suspension 1 g  1 g Oral TID WC & HS Von Bellis, MD   1 g at 01/01/24 9145   tamsulosin  (FLOMAX ) capsule 0.8 mg  0.8 mg Oral QPC supper Von Bellis, MD   0.8 mg at 12/31/23 1859   thyroid  (ARMOUR) tablet 60 mg  60 mg Oral Q0600 Von Bellis, MD   60 mg at 01/01/24 9491     Discharge Medications: Please see discharge summary for a list of discharge medications.  Relevant Imaging Results:  Relevant Lab Results:   Additional Information SSN    [id number]  Dalia GORMAN Fuse,  RN

## 2024-01-02 ENCOUNTER — Ambulatory Visit: Admitting: Physician Assistant

## 2024-01-03 ENCOUNTER — Encounter: Payer: Self-pay | Admitting: Student

## 2024-01-03 ENCOUNTER — Non-Acute Institutional Stay (SKILLED_NURSING_FACILITY): Payer: Self-pay | Admitting: Student

## 2024-01-03 DIAGNOSIS — K269 Duodenal ulcer, unspecified as acute or chronic, without hemorrhage or perforation: Secondary | ICD-10-CM | POA: Diagnosis not present

## 2024-01-03 DIAGNOSIS — K760 Fatty (change of) liver, not elsewhere classified: Secondary | ICD-10-CM

## 2024-01-03 DIAGNOSIS — L89302 Pressure ulcer of unspecified buttock, stage 2: Secondary | ICD-10-CM | POA: Diagnosis not present

## 2024-01-03 DIAGNOSIS — E43 Unspecified severe protein-calorie malnutrition: Secondary | ICD-10-CM | POA: Diagnosis not present

## 2024-01-03 DIAGNOSIS — R7401 Elevation of levels of liver transaminase levels: Secondary | ICD-10-CM

## 2024-01-03 DIAGNOSIS — K7030 Alcoholic cirrhosis of liver without ascites: Secondary | ICD-10-CM | POA: Diagnosis not present

## 2024-01-03 DIAGNOSIS — J9601 Acute respiratory failure with hypoxia: Secondary | ICD-10-CM | POA: Diagnosis not present

## 2024-01-03 DIAGNOSIS — Z7189 Other specified counseling: Secondary | ICD-10-CM

## 2024-01-03 DIAGNOSIS — D62 Acute posthemorrhagic anemia: Secondary | ICD-10-CM | POA: Diagnosis not present

## 2024-01-03 DIAGNOSIS — D696 Thrombocytopenia, unspecified: Secondary | ICD-10-CM | POA: Diagnosis not present

## 2024-01-03 DIAGNOSIS — S1121XS Laceration without foreign body of pharynx and cervical esophagus, sequela: Secondary | ICD-10-CM

## 2024-01-03 DIAGNOSIS — I700 Atherosclerosis of aorta: Secondary | ICD-10-CM | POA: Diagnosis not present

## 2024-01-03 NOTE — Progress Notes (Signed)
 Provider:  Dr. Richerd Brigham Location:  Other [place].  Nursing Home Room Number: [facility] [id number] Place of Service:  SNF (31)  PCP: Marylynn Verneita CROME, MD Patient Care Team: Marylynn Verneita CROME, MD as PCP - General (Internal Medicine) Darliss Rogue, MD as PCP - Cardiology (Cardiology) Jinny Carmine, MD as Consulting Physician (Gastroenterology) Melanee Annah BROCKS, MD as Consulting Physician (Oncology)  Extended Emergency Contact Information Primary Emergency Contact: Ralls,Thomas judson Mobile Phone: [phone] Relation: Son Interpreter needed? No Secondary Emergency Contact: Grose,sarah Mobile Phone: [phone] Relation: Daughter  Code Status: DNR Goals of Care: Advanced Directive information    12/26/2023   10:30 PM  Advanced Directives  Type of Advance Directive Healthcare Power of Attorney  Does patient want to make changes to medical advance directive? No - Patient declined      Chief Complaint  Patient presents with   New Admit To SNF    Admission.     HPI: Patient is a 83 y.o. male seen today for admission to [facility]  History of Present Illness The patient, with a history of bleeding ulcer and pneumonia, presents for follow-up care. He is accompanied by his wife, Rudell, and [place], who assists with transportation.  He was recently hospitalized due to a bleeding ulcer and pneumonia. The ulcer caused a significant drop in hemoglobin to 6.9, requiring a blood transfusion. He is currently on Protonix  40 mg twice daily for 30 days, then reducing to once daily. He notes improvement in symptoms but still experiences low energy levels, which he attributes to anemia. No chest pain, but he has slight shortness of breath and a tight feeling when taking deep breaths, which he attributes to residual effects of pneumonia.  He has a 'butt sore' that disrupts his sleep and appetite, leading to decreased energy and inability to perform regular physical workouts. The  sore has not fully healed, and he is working on improving his protein intake to aid in recovery. He mentions eating three meals a day and focusing on protein-rich foods.  He has a history of cirrhosis and is aware of the need for ongoing monitoring of his liver function. He recently stopped alcohol consumption a couple of weeks ago, having previously consumed a couple of glasses of wine daily. He is addressing his nutrition by increasing protein intake.  He uses a walker occasionally as a precaution and reports a near fall in March when he tripped over a rug. He has not had any falls since then. He does not drive currently due to his health issues but mentions he can perform daily activities independently.  He is on several medications including ascorbic acid , mirtazapine  for appetite and mood, sucralfate  before meals, Protonix , Flomax , and NP thyroid . He has been on Flomax  and thyroid  medication for a long time, while the others were added recently due to his current health issues.  Past Medical History - Bleeding gastric ulcer - Pneumonia - Anemia secondary to bleeding ulcer - Cirrhosis with severe hepatic steatosis - Stage 2 gluteal cleft pressure injury with surrounding stage 1 pressure injury - Severe protein deficiency - Hypothyroidism - History of tobacco use disorder, chewing tobacco    Medications - Protonix  - Vitamin C  - Mirtazapine  - Remeron  - Sulfa  - Flomax  - NP thyroid   Social History - Tobacco: Former smoker (quit 40 years ago). Chews tobacco for 30-40 years - Alcohol: Recently quit; previously consumed a couple of glasses of wine daily - Employment: Ran a Programmer, systems - Partner Status:  Married - Living Situation: Lives with wife, transitioning between a villa and a house   Physical Exam ABDOMEN: Abdomen firm and distended. RECTAL: Gluteal cleft with stage 2 pressure injury and surrounding stage 1.  Results LABS Hb: 6.9  RADIOLOGY CT abdomen and pelvis:  Cirrhosis, severe hepatic steatosis, no suspicious masses, no ascites (12/12/2021)  Assessment and Plan   Past Medical History:  Diagnosis Date   Allergy    Arthritis    Cirrhosis (HCC)    Complication of anesthesia    :need catheter   Dysrhythmia    ? something told by reg dr  unable to say what it was   Esophageal tear    Hypothyroidism    Pneumonia    hx   Primary osteoarthritis of left knee 06/22/2014   Rotator cuff tear    Sleep apnea corrected- dental device   Past Surgical History:  Procedure Laterality Date   BACK SURGERY     blepharaplasty     CATARACT EXTRACTION W/PHACO Left 02/21/2021   Procedure: CATARACT EXTRACTION PHACO AND INTRAOCULAR LENS PLACEMENT (IOC) LEFT 4.56 00:34.9;  Surgeon: Jaye Fallow, MD;  Location: [facility];  Service: Ophthalmology;  Laterality: Left;   CAUTERY OF TURBINATES     COLONOSCOPY WITH PROPOFOL  N/A 07/19/2015   Procedure: COLONOSCOPY WITH PROPOFOL ;  Surgeon: Rogelia Copping, MD;  Location: [facility] ENDOSCOPY;  Service: Endoscopy;  Laterality: N/A;   COLONOSCOPY WITH PROPOFOL  N/A 03/21/2021   Procedure: COLONOSCOPY WITH PROPOFOL ;  Surgeon: Copping Rogelia, MD;  Location: [facility] ENDOSCOPY;  Service: Endoscopy;  Laterality: N/A;   COLONOSCOPY WITH PROPOFOL  N/A 04/18/2021   Procedure: COLONOSCOPY WITH PROPOFOL ;  Surgeon: Copping Rogelia, MD;  Location: [facility] ENDOSCOPY;  Service: Endoscopy;  Laterality: N/A;   COLONOSCOPY WITH PROPOFOL  N/A 04/24/2022   Procedure: COLONOSCOPY WITH PROPOFOL ;  Surgeon: Copping Rogelia, MD;  Location: [facility] ENDOSCOPY;  Service: Endoscopy;  Laterality: N/A;   ESOPHAGOGASTRODUODENOSCOPY N/A 12/26/2023   Procedure: EGD (ESOPHAGOGASTRODUODENOSCOPY);  Surgeon: Copping Rogelia, MD;  Location: [facility] ENDOSCOPY;  Service: Endoscopy;  Laterality: N/A;   EYE SURGERY     HERNIA REPAIR Left 20+ years ago   inguinal herniorrhapy   HOT HEMOSTASIS  12/26/2023   Procedure: EGD, WITH ARGON PLASMA COAGULATION;  Surgeon: Copping Rogelia, MD;   Location: [facility] ENDOSCOPY;  Service: Endoscopy;;   JOINT REPLACEMENT  left knee 2016   KNEE SURGERY     ? loose body/ chondral defect 54yrs   ROTATOR CUFF REPAIR     left   SPINE SURGERY  10-12 years ago   SUBMUCOSAL INJECTION  12/26/2023   Procedure: INJECTION, SUBMUCOSAL;  Surgeon: Copping Rogelia, MD;  Location: [facility] ENDOSCOPY;  Service: Endoscopy;;   TONSILLECTOMY     TOTAL KNEE ARTHROPLASTY Left 06/22/2014   Procedure: TOTAL KNEE ARTHROPLASTY;  Surgeon: Fonda SHAUNNA Olmsted, MD;  Location: [facility] OR;  Service: Orthopedics;  Laterality: Left;    reports that he quit smoking about 40 years ago. His smoking use included cigarettes. He started smoking about 55 years ago. He has a 15 pack-year smoking history. His smokeless tobacco use includes chew. He reports current alcohol use of about 10.0 standard drinks of alcohol per week. He reports that he does not use drugs. Social History   Socioeconomic History   Marital status: Married    Spouse name: Not on file   Number of children: 2   Years of education: Not on file   Highest education level: Bachelor's degree (e.g., BA, AB, BS)  Occupational History   Occupation:  textiles  Tobacco Use   Smoking status: Former    Current packs/day: 0.00    Average packs/day: 1 pack/day for 15.0 years (15.0 ttl pk-yrs)    Types: Cigarettes    Start date: 06/14/1968    Quit date: 06/15/1983    Years since quitting: 40.5   Smokeless tobacco: Current    Types: Chew  Vaping Use   Vaping status: Never Used  Substance and Sexual Activity   Alcohol use: Yes    Alcohol/week: 10.0 standard drinks of alcohol    Types: 10 Glasses of wine per week    Comment: pt reports 3 glasses per day   Drug use: No   Sexual activity: Not Currently  Other Topics Concern   Not on file  Social History Narrative   [employer] graduate   Avid hunter   Very active   Walks 1 hour daily   Social Drivers of Health   Financial Resource Strain: Low Risk  (07/03/2023)   Overall Financial  Resource Strain (CARDIA)    Difficulty of Paying Living Expenses: Not hard at all  Food Insecurity: No Food Insecurity (12/26/2023)   Hunger Vital Sign    Worried About Running Out of Food in the Last Year: Never true    Ran Out of Food in the Last Year: Never true  Transportation Needs: No Transportation Needs (12/26/2023)   PRAPARE - Administrator, Civil Service (Medical): No    Lack of Transportation (Non-Medical): No  Physical Activity: Sufficiently Active (07/03/2023)   Exercise Vital Sign    Days of Exercise per Week: 3 days    Minutes of Exercise per Session: 60 min  Stress: No Stress Concern Present (07/03/2023)   [employer] of Occupational Health - Occupational Stress Questionnaire    Feeling of Stress : Not at all  Recent Concern: Stress - Stress Concern Present (05/19/2023)   [employer] of Occupational Health - Occupational Stress Questionnaire    Feeling of Stress : Rather much  Social Connections: Moderately Integrated (12/26/2023)   Social Connection and Isolation Panel    Frequency of Communication with Friends and Family: More than three times a week    Frequency of Social Gatherings with Friends and Family: More than three times a week    Attends Religious Services: Never    Database administrator or Organizations: Yes    Attends Engineer, structural: More than 4 times per year    Marital Status: Married  Catering manager Violence: Not At Risk (12/26/2023)   Humiliation, Afraid, Rape, and Kick questionnaire    Fear of Current or Ex-Partner: No    Emotionally Abused: No    Physically Abused: No    Sexually Abused: No    Functional Status Survey:    Family History  Problem Relation Age of Onset   Alcohol abuse Mother    Diabetes Mother    Mental retardation Mother    Arthritis Father    Hearing loss Father    Alcohol abuse Father     Health Maintenance  Topic Date Due   COVID-19 Vaccine (4 - 2024-25 season) 01/27/2023    Colonoscopy  04/25/2023   INFLUENZA VACCINE  12/27/2023   Medicare Annual Wellness (AWV)  07/02/2024   DTaP/Tdap/Td (5 - Td or Tdap) 06/07/2027   Pneumococcal Vaccine: 50+ Years  Completed   Hepatitis B Vaccines  Completed   HPV VACCINES  Aged Out   Meningococcal B Vaccine  Aged Out   Hepatitis  C Screening  Discontinued   Zoster Vaccines- Shingrix  Discontinued    No Known Allergies  Outpatient Encounter Medications as of 01/03/2024  Medication Sig   acetaminophen  (TYLENOL ) 325 MG tablet Take 650 mg by mouth every 4 (four) hours as needed.   ascorbic acid  (VITAMIN C ) 500 MG tablet Take 1 tablet (500 mg total) by mouth daily.   Boron 6 MG TABS Take by mouth daily.   cyanocobalamin  (VITAMIN B12) 1000 MCG tablet Take 1,000 mcg by mouth daily.   mirtazapine  (REMERON ) 7.5 MG tablet Take 1 tablet (7.5 mg total) by mouth at bedtime.   pantoprazole  (PROTONIX ) 40 MG tablet Take 1 tablet (40 mg total) by mouth 2 (two) times daily for 30 days, THEN 1 tablet (40 mg total) daily. (Patient taking differently: Take one tablet by mouth twice daily. )   sucralfate  (CARAFATE ) 1 GM/10ML suspension Take 10 mLs (1 g total) by mouth 4 (four) times daily -  with meals and at bedtime.   tamsulosin  (FLOMAX ) 0.4 MG CAPS capsule Take 2 capsules (0.8 mg total) by mouth daily.   thyroid  (NP THYROID ) 60 MG tablet Take 1 tablet (60 mg total) by mouth daily.   Zinc  Oxide 10 % OINT Apply twice daily to sacral decubitus ulcer   clotrimazole-betamethasone (LOTRISONE) cream Apply topically. (Patient not taking: Reported on 01/03/2024)   OVER THE COUNTER MEDICATION Take 20,000 mcg by mouth daily. Vitamin B12-methylfolate (Patient not taking: Reported on 01/03/2024)   tadalafil  (CIALIS ) 20 MG tablet TAKE ONE TABLET DAILY AS NEEDED FOR ERECTILE DYSFUNCTION (Patient not taking: Reported on 01/03/2024)   Testosterone  75 MG PLLT by Implant route. (Patient not taking: Reported on 01/03/2024)   Facility-Administered Encounter Medications  as of 01/03/2024  Medication   [START ON 04/28/2024] denosumab  (PROLIA ) injection 60 mg    Review of Systems  Vitals:   01/03/24 0807  BP: (!) 152/94  Pulse: (!) 105  Resp: 20  Temp: 97.7 F (36.5 C)  SpO2: 95%  Weight: 160 lb 12.8 oz (72.9 kg)  Height: 6' (1.829 m)   Body mass index is 21.81 kg/m. Physical Exam Cardiovascular:     Rate and Rhythm: Normal rate.     Pulses: Normal pulses.  Pulmonary:     Effort: Pulmonary effort is normal.  Abdominal:     Comments: Distended  Skin:    Coloration: Skin is jaundiced.  Neurological:     Mental Status: He is alert and oriented to person, place, and time.     Labs reviewed: Basic Metabolic Panel: Recent Labs    12/30/23 0549 12/31/23 0450 01/01/24 0423  NA 136 133* 135  K 3.8 3.9 3.5  CL 106 107 106  CO2 21* 23 25  GLUCOSE 101* 100* 118*  BUN 14 14 19   CREATININE 0.62 0.59* 0.69  CALCIUM 7.5* 7.5* 7.7*  MG 1.6* 1.8 1.7  PHOS 1.9* 2.2* 2.9   Liver Function Tests: Recent Labs    12/30/23 0549 12/31/23 0450 01/01/24 0423  AST 171* 156* 134*  ALT 108* 101* 90*  ALKPHOS 164* 162* 193*  BILITOT 6.3* 5.8* 5.2*  PROT 5.0* 5.2* 4.8*  ALBUMIN 1.9* 2.1* 1.8*   Recent Labs    12/20/23 1232  LIPASE 45   No results for input(s): AMMONIA in the last 8760 hours. CBC: Recent Labs    09/23/23 0914 12/13/23 1205 12/13/23 1912 12/20/23 1054 12/20/23 1232 12/30/23 0549 12/31/23 0450 01/01/24 0423  WBC 3.8* 2.7*   < > 6.1   < >  6.5 5.8 6.7  NEUTROABS 2.5 1.8  --  5.1  --   --   --   --   HGB 12.4* 12.2*   < > 11.2*   < > 10.0* 9.9* 9.9*  HCT 35.5* 35.3*   < > 32.3*   < > 30.5* 28.9* 29.3*  MCV 103.9* 104.6*   < > 107.1*   < > 107.8* 104.7* 105.8*  PLT 137.0* 110.0*   < > 134.0*   < > 160 143* 144*   < > = values in this interval not displayed.   Cardiac Enzymes: No results for input(s): CKTOTAL, CKMB, CKMBINDEX, TROPONINI in the last 8760 hours. BNP: Invalid input(s): POCBNP Lab Results   Component Value Date   HGBA1C 4.4 (L) 09/23/2023   Lab Results  Component Value Date   TSH 2.00 09/23/2023   Lab Results  Component Value Date   VITAMINB12 557 09/23/2023   Lab Results  Component Value Date   FOLATE 19.4 09/23/2023   Lab Results  Component Value Date   IRON 183 (H) 07/25/2023   TIBC 217 (L) 07/25/2023   FERRITIN 2,221 (H) 07/25/2023    Imaging and Procedures obtained prior to SNF admission: DG Chest Port 1 View Result Date: 12/26/2023 CLINICAL DATA:  Hypoxia. EXAM: PORTABLE CHEST 1 VIEW COMPARISON:  Chest radiograph dated 12/20/2023. FINDINGS: Shallow inspiration. Bilateral lower lung field opacities, new since the prior radiograph most consistent with pneumonia. No pleural effusion or pneumothorax. Biapical calcified plaques. Stable cardiac silhouette no acute osseous pathology. IMPRESSION: Bilateral lower lung field pneumonia. Electronically Signed   By: Vanetta Chou M.D.   On: 12/26/2023 16:21    Assessment/Plan Pressure injuries of bilateral gluteal region, stage 1 and 2 Stage 2 pressure injuries in the gluteal cleft with surrounding stage 1 pressure injury. Concerns about nutritional status and skin organ function due to existing wounds. Emphasis on the importance of protein intake for wound healing. - Encourage protein intake to support wound healing - Encourage sitting up for meals to reduce pressure on wounds  Protein-calorie malnutrition Severe protein deficiency likely contributing to pressure injuries and overall health status. Emphasis on increasing protein intake to address malnutrition and support healing. 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 Concerns about potential ascites due to abdominal distension. Previous CT scan showed no ascites, but an abdominal ultrasound  is planned to assess for fluid collection. Medical decision making includes considering diuretics before invasive procedures due to risks of bleeding and infection. Patient is currently without signs of bleeding, however, is high risk for complications. Transaminitis slightly improving. Jaundice present.  - Order abdominal ultrasound to assess for ascites - Consider diuretics like furosemide and spironolactone if ascites is present - Monitor liver function tests closely  Duodenal ulcer with recent gastrointestinal bleeding Recent gastric ulcer with gastrointestinal bleeding leading to hospitalization. Protonix  prescribed to prevent further bleeding. Hemoglobin dropped to 6.9, requiring transfusion. No recent bloody stools reported. - Continue Protonix  40 mg twice daily for 30 days, then reduce to once daily  Acute hypoxic Respiratory failure Pneumonia Periodic SOB and desat with exercise. Continue to optimize pulmonary toilet.   Anemia secondary to gastrointestinal bleeding Anemia secondary to gastrointestinal bleeding from a gastric ulcer. Hemoglobin was critically low at 6.9, requiring transfusion. Current energy levels are low, likely due to anemia. Per physical therapy patient O2 has dropped as low  as 85% with ambulation.  - Monitor hemoglobin levels - Encourage nutritional intake to support recovery from anemia - PRN o2 for exercise  Goals of Care He has expressed a desire not to be resuscitated and prefers not to be transferred to the hospital unless absolutely necessary. Wishes to avoid frequent hospitalizations and prefers care to be managed at the current facility as much as possible. He is open to one hospital transfer if necessary but prefers interventions to be managed at the facility first. - Ensure DNR status is documented and communicated to all care providers - Discuss and document preferences for hospital transfer only if necessary  Family/ staff Communication: Nursing,  Daughter, wife  Labs/tests ordered: CMP, CBC  I spent greater than 45  minutes for the care of this patient in face to face time, chart review, clinical documentation, patient education. I spent an additional 16 minutes discussing goals of care and advanced care planning.

## 2024-01-04 ENCOUNTER — Encounter: Payer: Self-pay | Admitting: Student

## 2024-01-04 DIAGNOSIS — R188 Other ascites: Secondary | ICD-10-CM | POA: Diagnosis not present

## 2024-01-06 ENCOUNTER — Non-Acute Institutional Stay (SKILLED_NURSING_FACILITY): Payer: Self-pay | Admitting: Student

## 2024-01-06 DIAGNOSIS — K7460 Unspecified cirrhosis of liver: Secondary | ICD-10-CM

## 2024-01-06 DIAGNOSIS — D539 Nutritional anemia, unspecified: Secondary | ICD-10-CM

## 2024-01-06 DIAGNOSIS — Z79899 Other long term (current) drug therapy: Secondary | ICD-10-CM | POA: Diagnosis not present

## 2024-01-06 LAB — BASIC METABOLIC PANEL WITH GFR
BUN: 23 — AB (ref 4–21)
CO2: 29 — AB (ref 13–22)
Chloride: 105 (ref 99–108)
Creatinine: 0.7 (ref 0.6–1.3)
Glucose: 88
Potassium: 4.2 meq/L (ref 3.5–5.1)
Sodium: 140 (ref 137–147)

## 2024-01-06 LAB — BPAM RBC
Blood Product Expiration Date: 202508292359
ISSUE DATE / TIME: 202508021152
Unit Type and Rh: 5100

## 2024-01-06 LAB — TYPE AND SCREEN
ABO/RH(D): O NEG
Antibody Screen: NEGATIVE
Unit division: 0

## 2024-01-06 LAB — CBC AND DIFFERENTIAL
HCT: 29 — AB (ref 41–53)
Hemoglobin: 10.2 — AB (ref 13.5–17.5)
Neutrophils Absolute: 4385
Platelets: 142 K/uL — AB (ref 150–400)
WBC: 5.8

## 2024-01-06 LAB — COMPREHENSIVE METABOLIC PANEL WITH GFR
Calcium: 7.9 — AB (ref 8.7–10.7)
eGFR: 92

## 2024-01-06 LAB — CBC: RBC: 2.88 — AB (ref 3.87–5.11)

## 2024-01-07 ENCOUNTER — Encounter: Payer: Self-pay | Admitting: Nurse Practitioner

## 2024-01-07 ENCOUNTER — Non-Acute Institutional Stay (SKILLED_NURSING_FACILITY): Payer: Self-pay | Admitting: Nurse Practitioner

## 2024-01-07 DIAGNOSIS — K6289 Other specified diseases of anus and rectum: Secondary | ICD-10-CM

## 2024-01-07 NOTE — Progress Notes (Signed)
 Location:  Other [facility].  Nursing Home Room Number: [facility] Place of Service:  SNF [phone]) Tony Luna  PCP: Marylynn Verneita CROME, MD  Patient Care Team: Marylynn Verneita CROME, MD as PCP - General (Internal Medicine) Darliss Rogue, MD as PCP - Cardiology (Cardiology) Jinny Carmine, MD as Consulting Physician (Gastroenterology) Melanee Annah BROCKS, MD as Consulting Physician (Oncology)  Extended Emergency Contact Information Primary Emergency Contact: Demello,Thomas judson Mobile Phone: [phone] Relation: Son Interpreter needed? No Secondary Emergency Contact: Demicco,sarah Mobile Phone: [phone] Relation: Daughter  Goals of care: Advanced Directive information    12/26/2023   10:30 PM  Advanced Directives  Type of Advance Directive Healthcare Power of Attorney  Does patient want to make changes to medical advance directive? No - Patient declined     Chief Complaint  Patient presents with   Nodule     Nodule Above Rectum    HPI:  Pt is a 83 y.o. male seen today for an acute visit for area noted by the rectum Pt with hx of alcoholic cirrhosis recently hospitalized for pneumonia and acute resp failure.  He is now at [facility] for rehab.  Staff has been monitoring pressure injury on sacrum and noted area around rectum Pt denies pain Reports he has hx of hemorrhoids but has not had one for over 20 years.  He states he noticed area last week but has not caused any discomfort or effected him when having BM Denies constipation or diarrhea.    Past Medical History:  Diagnosis Date   Allergy    Arthritis    Cirrhosis (HCC)    Complication of anesthesia    :need catheter   Dysrhythmia    ? something told by reg dr  unable to say what it was   Esophageal tear    Hypothyroidism    Pneumonia    hx   Primary osteoarthritis of left knee 06/22/2014   Rotator cuff tear    Sleep apnea corrected- dental device   Past Surgical History:  Procedure  Laterality Date   BACK SURGERY     blepharaplasty     CATARACT EXTRACTION W/PHACO Left 02/21/2021   Procedure: CATARACT EXTRACTION PHACO AND INTRAOCULAR LENS PLACEMENT (IOC) LEFT 4.56 00:34.9;  Surgeon: Jaye Fallow, MD;  Location: [facility];  Service: Ophthalmology;  Laterality: Left;   CAUTERY OF TURBINATES     COLONOSCOPY WITH PROPOFOL  N/A 07/19/2015   Procedure: COLONOSCOPY WITH PROPOFOL ;  Surgeon: Carmine Jinny, MD;  Location: [facility];  Service: Endoscopy;  Laterality: N/A;   COLONOSCOPY WITH PROPOFOL  N/A 03/21/2021   Procedure: COLONOSCOPY WITH PROPOFOL ;  Surgeon: Jinny Carmine, MD;  Location: [facility];  Service: Endoscopy;  Laterality: N/A;   COLONOSCOPY WITH PROPOFOL  N/A 04/18/2021   Procedure: COLONOSCOPY WITH PROPOFOL ;  Surgeon: Jinny Carmine, MD;  Location: [facility] END[facility];  Service: Endoscopy;  Laterality: N/A;   COLONOSCOPY WITH PROPOFOL  N/A 04/24/2022   Procedure: COLONOSCOPY WITH PROPOFOL ;  Surgeon: Jinny Carmine, MD;  Location: [facility] END[facility];  Service: Endoscopy;  Laterality: N/A;   ESOPHAGOGASTRODUODENOSCOPY N/A 12/26/2023   Procedure: EGD (ESOPHAGOGASTRODUODENOSCOPY);  Surgeon: Jinny Carmine, MD;  Location: [facility] END[facility];  Service: Endoscopy;  Laterality: N/A;   EYE SURGERY     HERNIA REPAIR Left 20+ years ago   inguinal herniorrhapy   HOT HEMOSTASIS  12/26/2023   Procedure: EGD, WITH ARGON PLASMA COAGULATION;  Surgeon: Jinny Carmine, MD;  Location: [facility] ENDOSCOPY;  Service: Endoscopy;;   JOINT REPLACEMENT  left knee 2016  KNEE SURGERY     ? loose body/ chondral defect 51yrs   ROTATOR CUFF REPAIR     left   SPINE SURGERY  10-12 years ago   SUBMUCOSAL INJECTION  12/26/2023   Procedure: INJECTION, SUBMUCOSAL;  Surgeon: Jinny Carmine, MD;  Location: [facility] END[facility];  Service: Endoscopy;;   TONSILLECTOMY     TOTAL KNEE ARTHROPLASTY Left 06/22/2014   Procedure: TOTAL KNEE ARTHROPLASTY;  Surgeon: Fonda SHAUNNA Olmsted, MD;  Location: [facility] OR;  Service:  Orthopedics;  Laterality: Left;    No Known Allergies  Outpatient Encounter Medications as of 01/07/2024  Medication Sig   acetaminophen  (TYLENOL ) 325 MG tablet Take 650 mg by mouth every 4 (four) hours as needed.   ascorbic acid  (VITAMIN C ) 500 MG tablet Take 1 tablet (500 mg total) by mouth daily.   Boron 6 MG TABS Take by mouth daily.   cyanocobalamin  (VITAMIN B12) 1000 MCG tablet Take 1,000 mcg by mouth daily.   furosemide (LASIX) 20 MG tablet Take 20 mg by mouth daily.   mirtazapine  (REMERON ) 7.5 MG tablet Take 1 tablet (7.5 mg total) by mouth at bedtime.   pantoprazole  (PROTONIX ) 40 MG tablet Take 1 tablet (40 mg total) by mouth 2 (two) times daily for 30 days, THEN 1 tablet (40 mg total) daily. (Patient taking differently: Take one tablet by mouth twice daily. )   spironolactone (ALDACTONE) 50 MG tablet Take 50 mg by mouth daily.   sucralfate  (CARAFATE ) 1 GM/10ML suspension Take 10 mLs (1 g total) by mouth 4 (four) times daily -  with meals and at bedtime.   tamsulosin  (FLOMAX ) 0.4 MG CAPS capsule Take 2 capsules (0.8 mg total) by mouth daily.   thyroid  (NP THYROID ) 60 MG tablet Take 1 tablet (60 mg total) by mouth daily.   Zinc  Oxide 10 % OINT Apply twice daily to sacral decubitus ulcer   clotrimazole-betamethasone (LOTRISONE) cream Apply topically. (Patient not taking: Reported on 01/07/2024)   OVER THE COUNTER MEDICATION Take 20,000 mcg by mouth daily. Vitamin B12-methylfolate (Patient not taking: Reported on 01/07/2024)   tadalafil  (CIALIS ) 20 MG tablet TAKE ONE TABLET DAILY AS NEEDED FOR ERECTILE DYSFUNCTION (Patient not taking: Reported on 01/07/2024)   Testosterone  75 MG PLLT by Implant route. (Patient not taking: Reported on 01/07/2024)   Facility-Administered Encounter Medications as of 01/07/2024  Medication   [START ON 04/28/2024] denosumab  (PROLIA ) injection 60 mg    Review of Systems  Gastrointestinal:  Negative for abdominal distention, abdominal pain, anal bleeding,  constipation, diarrhea, nausea and rectal pain.    Immunization History  Administered Date(s) Administered   Fluad Quad(high Dose 65+) 04/27/2019, 03/31/2020, 01/31/2021   Hep A / Hep B 12/13/2015, 01/17/2016, 06/26/2016   Influenza Whole 02/26/2008   Influenza, High Dose Seasonal PF 04/23/2018, 02/02/2022   Influenza,inj,Quad PF,6+ Mos 03/12/2013, 02/24/2014   Influenza-Unspecified 02/09/2016   PFIZER(Purple Top)SARS-COV-2 Vaccination 06/24/2019, 07/15/2019, 04/28/2020   PNEUMOCOCCAL CONJUGATE-20 01/31/2021   Pneumococcal Conjugate-13 08/06/2013   Pneumococcal Polysaccharide-23 05/24/2011   Polio, Unspecified 09/27/2000   Td 05/28/2004, 09/23/2015   Tdap 02/13/2006, 06/06/2017   Typhoid Live 08/30/1995, 10/04/1995   Zoster Recombinant(Shingrix) 02/24/2022, 07/03/2023   Zoster, Live 05/30/2007   Pertinent  Health Maintenance Due  Topic Date Due   Colonoscopy  04/25/2023   INFLUENZA VACCINE  12/27/2023      07/03/2023    9:37 AM 08/06/2023    9:29 AM 09/23/2023    8:37 AM 11/28/2023   11:59 AM 12/13/2023   11:16 AM  Fall  Risk  Falls in the past year? 0 0 1 0 1  Was there an injury with Fall? 0 0 0 0 0  Fall Risk Category Calculator 0 0 1 0 1  Patient at Risk for Falls Due to No Fall Risks No Fall Risks History of fall(s) No Fall Risks History of fall(s)  Fall risk Follow up Falls prevention discussed;Falls evaluation completed Falls evaluation completed;Education provided Falls evaluation completed Falls evaluation completed Falls evaluation completed   Functional Status Survey:    Vitals:   01/07/24 1312  BP: 126/78  Pulse: 86  Resp: 18  Temp: (!) 97.1 F (36.2 C)  SpO2: 94%  Weight: 160 lb 12.8 oz (72.9 kg)  Height: 6' (1.829 m)   Body mass index is 21.81 kg/m. Physical Exam Constitutional:      Appearance: Normal appearance.  Genitourinary:    Rectum: Mass present. No external hemorrhoid. Normal anal tone.     Comments: Round mass noted at anal opening. Small  ulceration noted in mass No erythema, drainage or tenderness.    Neurological:     Mental Status: He is alert. Mental status is at baseline.  Psychiatric:        Mood and Affect: Mood normal.     Labs reviewed: Recent Labs    12/30/23 0549 12/31/23 0450 01/01/24 0423 01/06/24 0000  NA 136 133* 135 140  K 3.8 3.9 3.5 4.2  CL 106 107 106 105  CO2 21* 23 25 29*  GLUCOSE 101* 100* 118*  --   BUN 14 14 19  23*  CREATININE 0.62 0.59* 0.69 0.7  CALCIUM 7.5* 7.5* 7.7* 7.9*  MG 1.6* 1.8 1.7  --   PHOS 1.9* 2.2* 2.9  --    Recent Labs    12/30/23 0549 12/31/23 0450 01/01/24 0423  AST 171* 156* 134*  ALT 108* 101* 90*  ALKPHOS 164* 162* 193*  BILITOT 6.3* 5.8* 5.2*  PROT 5.0* 5.2* 4.8*  ALBUMIN 1.9* 2.1* 1.8*   Recent Labs    12/13/23 1205 12/13/23 1912 12/20/23 1054 12/20/23 1232 12/30/23 0549 12/31/23 0450 01/01/24 0423 01/06/24 0000  WBC 2.7*   < > 6.1   < > 6.5 5.8 6.7 5.8  NEUTROABS 1.8  --  5.1  --   --   --   --  4,385.00  HGB 12.2*   < > 11.2*   < > 10.0* 9.9* 9.9* 10.2*  HCT 35.3*   < > 32.3*   < > 30.5* 28.9* 29.3* 29*  MCV 104.6*   < > 107.1*   < > 107.8* 104.7* 105.8*  --   PLT 110.0*   < > 134.0*   < > 160 143* 144* 142*   < > = values in this interval not displayed.   Lab Results  Component Value Date   TSH 2.00 09/23/2023   Lab Results  Component Value Date   HGBA1C 4.4 (L) 09/23/2023   Lab Results  Component Value Date   CHOL 202 (H) 09/23/2023   HDL 106.60 09/23/2023   LDLCALC 84 09/23/2023   LDLDIRECT 74.0 09/23/2023   TRIG 61.0 09/23/2023   CHOLHDL 2 09/23/2023    Significant Diagnostic Results in last 30 days:  DG Chest Port 1 View Result Date: 12/26/2023 CLINICAL DATA:  Hypoxia. EXAM: PORTABLE CHEST 1 VIEW COMPARISON:  Chest radiograph dated 12/20/2023. FINDINGS: Shallow inspiration. Bilateral lower lung field opacities, new since the prior radiograph most consistent with pneumonia. No pleural effusion or pneumothorax. Biapical  calcified plaques. Stable cardiac silhouette no acute osseous pathology. IMPRESSION: Bilateral lower lung field pneumonia. Electronically Signed   By: Vanetta Chou M.D.   On: 12/26/2023 16:21   CT Chest Wo Contrast Result Date: 12/20/2023 CLINICAL DATA:  Respiratory illness. EXAM: CT CHEST WITHOUT CONTRAST TECHNIQUE: Multidetector CT imaging of the chest was performed following the standard protocol without IV contrast. RADIATION DOSE REDUCTION: This exam was performed according to the departmental dose-optimization program which includes automated exposure control, adjustment of the mA and/or kV according to patient size and/or use of iterative reconstruction technique. COMPARISON:  Chest radiograph dated 12/20/2023. FINDINGS: Evaluation of this exam is limited in the absence of intravenous contrast. Cardiovascular: There is no cardiomegaly or pericardial effusion. There is 3 vessel coronary vascular calcification. Mild atherosclerotic calcification of the thoracic aorta. No evidence of bowel dilatation. The central pulmonary arteries are grossly unremarkable. Mediastinum/Nodes: No hilar or mediastinal adenopathy. The esophagus is grossly unremarkable. No mediastinal fluid collection. Lungs/Pleura: Mild bilateral lower lobe bronchiectasis. Diffuse ground-glass density in the lower lobes and lower lung fields as well as interstitial and subpleural densities likely represent pneumonia. Underlying interstitial lung disease is not excluded but favored less likely. Biapical calcified pleural plaques. Trace left pleural effusion. No pneumothorax. Upper Abdomen: Fatty liver and cirrhosis. A 3 mm nonobstructing left renal stone. Musculoskeletal: Degenerative changes of the spine. No acute osseous pathology. IMPRESSION: 1. Bilateral lower lobe and lower lung field predominant densities likely represent pneumonia. Clinical correlation and follow-up recommended. 2. Fatty liver and cirrhosis. 3. A 3 mm nonobstructing  left renal stone. 4.  Aortic Atherosclerosis (ICD10-I70.0). Electronically Signed   By: Vanetta Chou M.D.   On: 12/20/2023 14:20   DG Chest 2 View Result Date: 12/20/2023 CLINICAL DATA:  Shortness of breath, liver failure EXAM: CHEST - 2 VIEW COMPARISON:  12/13/2023 FINDINGS: Lower thoracic spondylosis. Atherosclerotic calcification of the aortic arch. Biapical pleuroparenchymal scarring with associated calcifications. Reticular interstitial accentuation especially posteriorly in the lower lobes, although judged to be mildly improved from 12/13/2023. IMPRESSION: 1. Reticular interstitial accentuation especially posteriorly in the lower lobes, although judged to be mildly improved from 12/13/2023. This could reflect resolving noncardiogenic edema or resolving infection. 2. Lower thoracic spondylosis. 3. Aortic Atherosclerosis (ICD10-I70.0). Electronically Signed   By: Ryan Salvage M.D.   On: 12/20/2023 13:15   CT ABDOMEN PELVIS W CONTRAST Result Date: 12/13/2023 EXAM: CT ABDOMEN AND PELVIS WITH CONTRAST 12/13/2023 11:11:54 PM TECHNIQUE: CT of the abdomen and pelvis was performed with the administration of intravenous contrast. Multiplanar reformatted images are provided for review. Automated exposure control, iterative reconstruction, and/or weight based adjustment of the mA/kV was utilized to reduce the radiation dose to as low as reasonably achievable. COMPARISON: MR abdomen dated 04/08/2022. CLINICAL HISTORY: Elevated LFTs, concerns for mass. Pt reports having bloodwork done today and was sent over by Doctor Tullo for abnormal results, pt unable to recall results. Pt reports having sore on his bottom x 2 months. Pt reports weakness x 1 week. Denies CP or SOB. FINDINGS: LOWER CHEST: Subpleural patchy opacities in the bilateral lower lobes favoring mild infection/pneumonia over postinfectious/inflammatory scarring. LIVER: Cirrhosis. Severe hepatic steatosis with scattered areas of focal fatty sparing,  including in the posterior right hepatic lobe, although non-mass-like. GALLBLADDER AND BILE DUCTS: Gallbladder is unremarkable. No biliary ductal dilatation. SPLEEN: No acute abnormality. PANCREAS: No acute abnormality. ADRENAL GLANDS: No acute abnormality. KIDNEYS, URETERS AND BLADDER: No stones in the kidneys or ureters. No hydronephrosis. No perinephric or periureteral stranding. Urinary bladder  is unremarkable. GI AND BOWEL: Stomach demonstrates no acute abnormality. There is no bowel obstruction. No bowel wall thickening. Scattered colonic diverticulosis, without evidence of diverticulitis. Appendix is not discretely visualized. PERITONEUM AND RETROPERITONEUM: No ascites. No free air. VASCULATURE: Atherosclerotic calcifications of the abdominal aorta and branch vessels, although patent. Portal vein is patent. LYMPH NODES: No lymphadenopathy. REPRODUCTIVE ORGANS: No acute abnormality. BONES AND SOFT TISSUES: No acute osseous abnormality. Small fat-containing right inguinal hernia. IMPRESSION: 1. Cirrhosis and severe hepatic steatosis. No suspicious hepatic mass. 2. Subpleural patchy opacities in the bilateral lower lobes, favoring mild infection/pneumonia over postinfectious/inflammatory scarring. Electronically signed by: Pinkie Pebbles MD 12/13/2023 11:18 PM EDT RP Workstation: [id number]   DG Chest 1 View Result Date: 12/13/2023 EXAM: 1 VIEW XRAY OF THE CHEST 12/13/2023 09:49:41 PM COMPARISON: 11/04/2019 CLINICAL HISTORY: Weakness. Table formatting from the original note was not included. Pt reports having bloodwork done today and was sent over by Doctor Tullo for abnormal results, pt unable to recall results. Pt reports having sore on his bottom x 2 months. Pt reports weakness x 1 week. FINDINGS: LUNGS AND PLEURA: No focal pulmonary opacity. No pulmonary edema. No pleural effusion. No pneumothorax. HEART AND MEDIASTINUM: No acute abnormality of the cardiac and mediastinal silhouettes. BONES AND SOFT  TISSUES: No acute osseous abnormality. IMPRESSION: 1. No acute process. Electronically signed by: Pinkie Pebbles MD 12/13/2023 10:00 PM EDT RP Workstation: [id number]   US  ABDOMEN LIMITED RUQ (LIVER/GB) Result Date: 12/13/2023 CLINICAL DATA:  Transaminitis. EXAM: ULTRASOUND ABDOMEN LIMITED RIGHT UPPER QUADRANT COMPARISON:  August 01, 2023 FINDINGS: Gallbladder: No gallstones or wall thickening visualized (2.4 mm). No sonographic Murphy sign noted by sonographer. Common bile duct: Diameter: 3.7 mm (partially visualized) Liver: No focal lesion identified. The liver parenchyma is coarse in echotexture and diffusely increased in echogenicity. Portal vein is patent on color Doppler imaging with normal direction of blood flow towards the liver. Other: None. IMPRESSION: Hepatic cirrhosis without focal liver lesions. Electronically Signed   By: Suzen Dials M.D.   On: 12/13/2023 21:48    Assessment/Plan 1. Mass of anus (Primary) Noted today, but reports he noticed last week  No pain, tenderness on exam, no erythema, or drainage noted GI consult for further evaluation.    Coley Littles K. Caro BODILY [facility] [phone]

## 2024-01-08 ENCOUNTER — Encounter: Payer: Self-pay | Admitting: Student

## 2024-01-08 ENCOUNTER — Non-Acute Institutional Stay (SKILLED_NURSING_FACILITY): Payer: Self-pay | Admitting: Student

## 2024-01-08 DIAGNOSIS — R17 Unspecified jaundice: Secondary | ICD-10-CM | POA: Diagnosis not present

## 2024-01-08 DIAGNOSIS — L89302 Pressure ulcer of unspecified buttock, stage 2: Secondary | ICD-10-CM | POA: Diagnosis not present

## 2024-01-08 DIAGNOSIS — D489 Neoplasm of uncertain behavior, unspecified: Secondary | ICD-10-CM | POA: Diagnosis not present

## 2024-01-08 NOTE — Progress Notes (Signed)
 Location:  Other [place].  Nursing Home Room Number: [facility] Place of Service:  SNF [phone]) Provider:  Dr. Richerd Brigham  PCP: Marylynn Verneita CROME, MD  Patient Care Team: Marylynn Verneita CROME, MD as PCP - General (Internal Medicine) Darliss Rogue, MD as PCP - Cardiology (Cardiology) Jinny Carmine, MD as Consulting Physician (Gastroenterology) Melanee Annah BROCKS, MD as Consulting Physician (Oncology)  Extended Emergency Contact Information Primary Emergency Contact: Marmo,Thomas judson Mobile Phone: [phone] Relation: Son Interpreter needed? No Secondary Emergency Contact: Rosato,sarah Mobile Phone: [phone] Relation: Daughter  Code Status:  DNR Goals of care: Advanced Directive information    12/26/2023   10:30 PM  Advanced Directives  Type of Advance Directive Healthcare Power of Attorney  Does patient want to make changes to medical advance directive? No - Patient declined     Chief Complaint  Patient presents with   Medical Management of Chronic Issues    Medical Management of Chronic Issues.     HPI:  Pt is a 83 y.o. male seen today for medical management of chronic diseases.    History of Present Illness The patient presents with a newly noticed anal nodule.  He recently noticed a nodule at the exit of his anus. He has experienced hemorrhoids before, but this nodule did not feel like a hemorrhoid to him. No issues with bowel movements and he is able to pass stool around the nodule.  He has a history of jaundice, characterized by a yellow tint in his eyes and skin, which has started to clear up. He is currently on a low dose of diuretics and has not experienced any drops in blood pressure.  He denies any history of skin cancers, including basal cell or squamous cell carcinoma, although he mentions having 'little spots.' He has not had any recent GI consultations but underwent a colonoscopy approximately a year to a year and a half ago, during which no  abnormalities were noted.  Surgical History: - Colonoscopy (1-1.5 years ago)   Past Medical History:  Diagnosis Date   Allergy    Arthritis    Cirrhosis (HCC)    Complication of anesthesia    :need catheter   Dysrhythmia    ? something told by reg dr  unable to say what it was   Esophageal tear    Hypothyroidism    Pneumonia    hx   Primary osteoarthritis of left knee 06/22/2014   Rotator cuff tear    Sleep apnea corrected- dental device   Past Surgical History:  Procedure Laterality Date   BACK SURGERY     blepharaplasty     CATARACT EXTRACTION W/PHACO Left 02/21/2021   Procedure: CATARACT EXTRACTION PHACO AND INTRAOCULAR LENS PLACEMENT (IOC) LEFT 4.56 00:34.9;  Surgeon: Jaye Fallow, MD;  Location: [facility];  Service: Ophthalmology;  Laterality: Left;   CAUTERY OF TURBINATES     COLONOSCOPY WITH PROPOFOL  N/A 07/19/2015   Procedure: COLONOSCOPY WITH PROPOFOL ;  Surgeon: Carmine Jinny, MD;  Location: [facility] ENDOSCOPY;  Service: Endoscopy;  Laterality: N/A;   COLONOSCOPY WITH PROPOFOL  N/A 03/21/2021   Procedure: COLONOSCOPY WITH PROPOFOL ;  Surgeon: Jinny Carmine, MD;  Location: [facility] ENDOSCOPY;  Service: Endoscopy;  Laterality: N/A;   COLONOSCOPY WITH PROPOFOL  N/A 04/18/2021   Procedure: COLONOSCOPY WITH PROPOFOL ;  Surgeon: Jinny Carmine, MD;  Location: [facility] ENDOSCOPY;  Service: Endoscopy;  Laterality: N/A;   COLONOSCOPY WITH PROPOFOL  N/A 04/24/2022   Procedure: COLONOSCOPY WITH PROPOFOL ;  Surgeon: Jinny Carmine, MD;  Location: [facility] ENDOSCOPY;  Service: Endoscopy;  Laterality: N/A;   ESOPHAGOGASTRODUODENOSCOPY N/A 12/26/2023   Procedure: EGD (ESOPHAGOGASTRODUODENOSCOPY);  Surgeon: Jinny Carmine, MD;  Location: [facility] ENDOSCOPY;  Service: Endoscopy;  Laterality: N/A;   EYE SURGERY     HERNIA REPAIR Left 20+ years ago   inguinal herniorrhapy   HOT HEMOSTASIS  12/26/2023   Procedure: EGD, WITH ARGON PLASMA COAGULATION;  Surgeon: Jinny Carmine, MD;  Location: [facility] ENDOSCOPY;   Service: Endoscopy;;   JOINT REPLACEMENT  left knee 2016   KNEE SURGERY     ? loose body/ chondral defect 60yrs   ROTATOR CUFF REPAIR     left   SPINE SURGERY  10-12 years ago   SUBMUCOSAL INJECTION  12/26/2023   Procedure: INJECTION, SUBMUCOSAL;  Surgeon: Jinny Carmine, MD;  Location: [facility] ENDOSCOPY;  Service: Endoscopy;;   TONSILLECTOMY     TOTAL KNEE ARTHROPLASTY Left 06/22/2014   Procedure: TOTAL KNEE ARTHROPLASTY;  Surgeon: Fonda SHAUNNA Olmsted, MD;  Location: [facility] OR;  Service: Orthopedics;  Laterality: Left;    No Known Allergies  Outpatient Encounter Medications as of 01/08/2024  Medication Sig   acetaminophen  (TYLENOL ) 325 MG tablet Take 650 mg by mouth every 4 (four) hours as needed. (Patient taking differently: Take 650 mg by mouth every 8 (eight) hours as needed.)   ascorbic acid  (VITAMIN C ) 500 MG tablet Take 1 tablet (500 mg total) by mouth daily.   Boron 6 MG TABS Take by mouth daily.   cyanocobalamin  (VITAMIN B12) 1000 MCG tablet Take 1,000 mcg by mouth daily.   furosemide (LASIX) 20 MG tablet Take 20 mg by mouth daily.   mirtazapine  (REMERON ) 7.5 MG tablet Take 1 tablet (7.5 mg total) by mouth at bedtime.   pantoprazole  (PROTONIX ) 40 MG tablet Take 1 tablet (40 mg total) by mouth 2 (two) times daily for 30 days, THEN 1 tablet (40 mg total) daily. (Patient taking differently: Take one tablet by mouth twice daily. )   spironolactone (ALDACTONE) 50 MG tablet Take 50 mg by mouth daily.   sucralfate  (CARAFATE ) 1 GM/10ML suspension Take 10 mLs (1 g total) by mouth 4 (four) times daily -  with meals and at bedtime.   tamsulosin  (FLOMAX ) 0.4 MG CAPS capsule Take 2 capsules (0.8 mg total) by mouth daily.   thyroid  (NP THYROID ) 60 MG tablet Take 1 tablet (60 mg total) by mouth daily.   Zinc  Oxide 10 % OINT Apply twice daily to sacral decubitus ulcer   clotrimazole-betamethasone (LOTRISONE) cream Apply topically. (Patient not taking: Reported on 01/08/2024)   OVER THE COUNTER MEDICATION  Take 20,000 mcg by mouth daily. Vitamin B12-methylfolate (Patient not taking: Reported on 01/08/2024)   tadalafil  (CIALIS ) 20 MG tablet TAKE ONE TABLET DAILY AS NEEDED FOR ERECTILE DYSFUNCTION (Patient not taking: Reported on 01/08/2024)   Testosterone  75 MG PLLT by Implant route. (Patient not taking: Reported on 01/08/2024)   Facility-Administered Encounter Medications as of 01/08/2024  Medication   [START ON 04/28/2024] denosumab  (PROLIA ) injection 60 mg    Review of Systems  Immunization History  Administered Date(s) Administered   Fluad Quad(high Dose 65+) 04/27/2019, 03/31/2020, 01/31/2021   Hep A / Hep B 12/13/2015, 01/17/2016, 06/26/2016   Influenza Whole 02/26/2008   Influenza, High Dose Seasonal PF 04/23/2018, 02/02/2022   Influenza,inj,Quad PF,6+ Mos 03/12/2013, 02/24/2014   Influenza-Unspecified 02/09/2016   PFIZER(Purple Top)SARS-COV-2 Vaccination 06/24/2019, 07/15/2019, 04/28/2020   PNEUMOCOCCAL CONJUGATE-20 01/31/2021   Pneumococcal Conjugate-13 08/06/2013   Pneumococcal Polysaccharide-23 05/24/2011   Polio, Unspecified 09/27/2000   Td 05/28/2004, 09/23/2015  Tdap 02/13/2006, 06/06/2017   Typhoid Live 08/30/1995, 10/04/1995   Zoster Recombinant(Shingrix) 02/24/2022, 07/03/2023   Zoster, Live 05/30/2007   Pertinent  Health Maintenance Due  Topic Date Due   Colonoscopy  04/25/2023   INFLUENZA VACCINE  12/27/2023      07/03/2023    9:37 AM 08/06/2023    9:29 AM 09/23/2023    8:37 AM 11/28/2023   11:59 AM 12/13/2023   11:16 AM  Fall Risk  Falls in the past year? 0 0 1 0 1  Was there an injury with Fall? 0 0 0 0 0  Fall Risk Category Calculator 0 0 1 0 1  Patient at Risk for Falls Due to No Fall Risks No Fall Risks History of fall(s) No Fall Risks History of fall(s)  Fall risk Follow up Falls prevention discussed;Falls evaluation completed Falls evaluation completed;Education provided Falls evaluation completed Falls evaluation completed Falls evaluation completed    Functional Status Survey:    Vitals:   01/08/24 0906  BP: 125/78  Pulse: (!) 114  Resp: 20  Temp: (!) 96.9 F (36.1 C)  SpO2: 95%  Weight: 160 lb 12.8 oz (72.9 kg)  Height: 6' (1.829 m)   Body mass index is 21.81 kg/m. Physical Exam Genitourinary:    Comments: RECTAL: 1.2 cm nodule with ulceration at external anal sphincter.     Labs reviewed: Recent Labs    12/30/23 0549 12/31/23 0450 01/01/24 0423 01/06/24 0000  NA 136 133* 135 140  K 3.8 3.9 3.5 4.2  CL 106 107 106 105  CO2 21* 23 25 29*  GLUCOSE 101* 100* 118*  --   BUN 14 14 19  23*  CREATININE 0.62 0.59* 0.69 0.7  CALCIUM 7.5* 7.5* 7.7* 7.9*  MG 1.6* 1.8 1.7  --   PHOS 1.9* 2.2* 2.9  --    Recent Labs    12/30/23 0549 12/31/23 0450 01/01/24 0423  AST 171* 156* 134*  ALT 108* 101* 90*  ALKPHOS 164* 162* 193*  BILITOT 6.3* 5.8* 5.2*  PROT 5.0* 5.2* 4.8*  ALBUMIN 1.9* 2.1* 1.8*   Recent Labs    12/13/23 1205 12/13/23 1912 12/20/23 1054 12/20/23 1232 12/30/23 0549 12/31/23 0450 01/01/24 0423 01/06/24 0000  WBC 2.7*   < > 6.1   < > 6.5 5.8 6.7 5.8  NEUTROABS 1.8  --  5.1  --   --   --   --  4,385.00  HGB 12.2*   < > 11.2*   < > 10.0* 9.9* 9.9* 10.2*  HCT 35.3*   < > 32.3*   < > 30.5* 28.9* 29.3* 29*  MCV 104.6*   < > 107.1*   < > 107.8* 104.7* 105.8*  --   PLT 110.0*   < > 134.0*   < > 160 143* 144* 142*   < > = values in this interval not displayed.   Lab Results  Component Value Date   TSH 2.00 09/23/2023   Lab Results  Component Value Date   HGBA1C 4.4 (L) 09/23/2023   Lab Results  Component Value Date   CHOL 202 (H) 09/23/2023   HDL 106.60 09/23/2023   LDLCALC 84 09/23/2023   LDLDIRECT 74.0 09/23/2023   TRIG 61.0 09/23/2023   CHOLHDL 2 09/23/2023    Significant Diagnostic Results in last 30 days:  DG Chest Port 1 View Result Date: 12/26/2023 CLINICAL DATA:  Hypoxia. EXAM: PORTABLE CHEST 1 VIEW COMPARISON:  Chest radiograph dated 12/20/2023. FINDINGS: Shallow  inspiration. Bilateral lower lung  field opacities, new since the prior radiograph most consistent with pneumonia. No pleural effusion or pneumothorax. Biapical calcified plaques. Stable cardiac silhouette no acute osseous pathology. IMPRESSION: Bilateral lower lung field pneumonia. Electronically Signed   By: Vanetta Chou M.D.   On: 12/26/2023 16:21   CT Chest Wo Contrast Result Date: 12/20/2023 CLINICAL DATA:  Respiratory illness. EXAM: CT CHEST WITHOUT CONTRAST TECHNIQUE: Multidetector CT imaging of the chest was performed following the standard protocol without IV contrast. RADIATION DOSE REDUCTION: This exam was performed according to the departmental dose-optimization program which includes automated exposure control, adjustment of the mA and/or kV according to patient size and/or use of iterative reconstruction technique. COMPARISON:  Chest radiograph dated 12/20/2023. FINDINGS: Evaluation of this exam is limited in the absence of intravenous contrast. Cardiovascular: There is no cardiomegaly or pericardial effusion. There is 3 vessel coronary vascular calcification. Mild atherosclerotic calcification of the thoracic aorta. No evidence of bowel dilatation. The central pulmonary arteries are grossly unremarkable. Mediastinum/Nodes: No hilar or mediastinal adenopathy. The esophagus is grossly unremarkable. No mediastinal fluid collection. Lungs/Pleura: Mild bilateral lower lobe bronchiectasis. Diffuse ground-glass density in the lower lobes and lower lung fields as well as interstitial and subpleural densities likely represent pneumonia. Underlying interstitial lung disease is not excluded but favored less likely. Biapical calcified pleural plaques. Trace left pleural effusion. No pneumothorax. Upper Abdomen: Fatty liver and cirrhosis. A 3 mm nonobstructing left renal stone. Musculoskeletal: Degenerative changes of the spine. No acute osseous pathology. IMPRESSION: 1. Bilateral lower lobe and lower lung  field predominant densities likely represent pneumonia. Clinical correlation and follow-up recommended. 2. Fatty liver and cirrhosis. 3. A 3 mm nonobstructing left renal stone. 4.  Aortic Atherosclerosis (ICD10-I70.0). Electronically Signed   By: Vanetta Chou M.D.   On: 12/20/2023 14:20   DG Chest 2 View Result Date: 12/20/2023 CLINICAL DATA:  Shortness of breath, liver failure EXAM: CHEST - 2 VIEW COMPARISON:  12/13/2023 FINDINGS: Lower thoracic spondylosis. Atherosclerotic calcification of the aortic arch. Biapical pleuroparenchymal scarring with associated calcifications. Reticular interstitial accentuation especially posteriorly in the lower lobes, although judged to be mildly improved from 12/13/2023. IMPRESSION: 1. Reticular interstitial accentuation especially posteriorly in the lower lobes, although judged to be mildly improved from 12/13/2023. This could reflect resolving noncardiogenic edema or resolving infection. 2. Lower thoracic spondylosis. 3. Aortic Atherosclerosis (ICD10-I70.0). Electronically Signed   By: Ryan Salvage M.D.   On: 12/20/2023 13:15   CT ABDOMEN PELVIS W CONTRAST Result Date: 12/13/2023 EXAM: CT ABDOMEN AND PELVIS WITH CONTRAST 12/13/2023 11:11:54 PM TECHNIQUE: CT of the abdomen and pelvis was performed with the administration of intravenous contrast. Multiplanar reformatted images are provided for review. Automated exposure control, iterative reconstruction, and/or weight based adjustment of the mA/kV was utilized to reduce the radiation dose to as low as reasonably achievable. COMPARISON: MR abdomen dated 04/08/2022. CLINICAL HISTORY: Elevated LFTs, concerns for mass. Pt reports having bloodwork done today and was sent over by Doctor Tullo for abnormal results, pt unable to recall results. Pt reports having sore on his bottom x 2 months. Pt reports weakness x 1 week. Denies CP or SOB. FINDINGS: LOWER CHEST: Subpleural patchy opacities in the bilateral lower lobes  favoring mild infection/pneumonia over postinfectious/inflammatory scarring. LIVER: Cirrhosis. Severe hepatic steatosis with scattered areas of focal fatty sparing, including in the posterior right hepatic lobe, although non-mass-like. GALLBLADDER AND BILE DUCTS: Gallbladder is unremarkable. No biliary ductal dilatation. SPLEEN: No acute abnormality. PANCREAS: No acute abnormality. ADRENAL GLANDS: No acute abnormality. KIDNEYS, URETERS AND  BLADDER: No stones in the kidneys or ureters. No hydronephrosis. No perinephric or periureteral stranding. Urinary bladder is unremarkable. GI AND BOWEL: Stomach demonstrates no acute abnormality. There is no bowel obstruction. No bowel wall thickening. Scattered colonic diverticulosis, without evidence of diverticulitis. Appendix is not discretely visualized. PERITONEUM AND RETROPERITONEUM: No ascites. No free air. VASCULATURE: Atherosclerotic calcifications of the abdominal aorta and branch vessels, although patent. Portal vein is patent. LYMPH NODES: No lymphadenopathy. REPRODUCTIVE ORGANS: No acute abnormality. BONES AND SOFT TISSUES: No acute osseous abnormality. Small fat-containing right inguinal hernia. IMPRESSION: 1. Cirrhosis and severe hepatic steatosis. No suspicious hepatic mass. 2. Subpleural patchy opacities in the bilateral lower lobes, favoring mild infection/pneumonia over postinfectious/inflammatory scarring. Electronically signed by: Pinkie Pebbles MD 12/13/2023 11:18 PM EDT RP Workstation: [id number]   DG Chest 1 View Result Date: 12/13/2023 EXAM: 1 VIEW XRAY OF THE CHEST 12/13/2023 09:49:41 PM COMPARISON: 11/04/2019 CLINICAL HISTORY: Weakness. Table formatting from the original note was not included. Pt reports having bloodwork done today and was sent over by Doctor Tullo for abnormal results, pt unable to recall results. Pt reports having sore on his bottom x 2 months. Pt reports weakness x 1 week. FINDINGS: LUNGS AND PLEURA: No focal pulmonary opacity.  No pulmonary edema. No pleural effusion. No pneumothorax. HEART AND MEDIASTINUM: No acute abnormality of the cardiac and mediastinal silhouettes. BONES AND SOFT TISSUES: No acute osseous abnormality. IMPRESSION: 1. No acute process. Electronically signed by: Pinkie Pebbles MD 12/13/2023 10:00 PM EDT RP Workstation: [id number]   US  ABDOMEN LIMITED RUQ (LIVER/GB) Result Date: 12/13/2023 CLINICAL DATA:  Transaminitis. EXAM: ULTRASOUND ABDOMEN LIMITED RIGHT UPPER QUADRANT COMPARISON:  August 01, 2023 FINDINGS: Gallbladder: No gallstones or wall thickening visualized (2.4 mm). No sonographic Murphy sign noted by sonographer. Common bile duct: Diameter: 3.7 mm (partially visualized) Liver: No focal lesion identified. The liver parenchyma is coarse in echotexture and diffusely increased in echogenicity. Portal vein is patent on color Doppler imaging with normal direction of blood flow towards the liver. Other: None. IMPRESSION: Hepatic cirrhosis without focal liver lesions. Electronically Signed   By: Suzen Dials M.D.   On: 12/13/2023 21:48    Assessment/Plan Neoplasm of uncertain behavior A 1.2 cm nodule with ulceration at the external anal sphincter, blocking a significant portion of the sphincter. Differential diagnosis includes polyp and squamous cell carcinoma. The lesion does not resemble a hemorrhoid, raising concern for possible skin cancer. - Refer to GI for evaluation and possible sigmoidoscopy. - Consider biopsy of the lesion if recommended by GI.  Pressure injury Pressure injury is much improved, though it continues to cause some discomfort.  Jaundice (improving) and ascites Jaundice and ascites are improving. Diuretics started at a low dose due to borderline low blood pressure. No signs of hypotension observed. - Continue current diuretic regimen at low dose.   Family/ staff Communication: nursing  Labs/tests ordered:  none

## 2024-01-08 NOTE — Progress Notes (Signed)
 Location:  Other Nursing Home Room Number: [facility] Place of Service:  SNF [phone]) Provider:  Abdul Kettering, Verneita CROME, MD  Patient Care Team: Kettering Verneita CROME, MD as PCP - General (Internal Medicine) Darliss Rogue, MD as PCP - Cardiology (Cardiology) Jinny Carmine, MD as Consulting Physician (Gastroenterology) Melanee Annah BROCKS, MD as Consulting Physician (Oncology)  Extended Emergency Contact Information Primary Emergency Contact: Harpham,Thomas judson Mobile Phone: [phone] Relation: Son Interpreter needed? No Secondary Emergency Contact: Athey,sarah Mobile Phone: [phone] Relation: Daughter  Code Status:  DNR Goals of care: Advanced Directive information    12/26/2023   10:30 PM  Advanced Directives  Type of Advance Directive Healthcare Power of Attorney  Does patient want to make changes to medical advance directive? No - Patient declined     Chief Complaint  Patient presents with   Atrial Fibrillation    b   Ascites    HPI:  Pt is a 83 y.o. male seen today for an acute visit  Discussed the use of AI scribe software for clinical note transcription with the patient, who gave verbal consent to proceed.  History of Present Illness    History of Present Illness The patient, with cirrhosis, presents with ascites for follow-up after an abdominal ultrasound.  He underwent an abdominal ultrasound which revealed significant ascites. He currently has no stomach pain or discomfort. He has not undergone paracentesis.  He has a history of anemia and low hemoglobin levels, which required a transfusion during a previous hospital visit.  No current stomach pain or discomfort.   Past Medical History:  Diagnosis Date   Allergy    Arthritis    Cirrhosis (HCC)    Complication of anesthesia    :need catheter   Dysrhythmia    ? something told by reg dr  unable to say what it was   Esophageal tear    Hypothyroidism    Pneumonia    hx   Primary  osteoarthritis of left knee 06/22/2014   Rotator cuff tear    Sleep apnea corrected- dental device   Past Surgical History:  Procedure Laterality Date   BACK SURGERY     blepharaplasty     CATARACT EXTRACTION W/PHACO Left 02/21/2021   Procedure: CATARACT EXTRACTION PHACO AND INTRAOCULAR LENS PLACEMENT (IOC) LEFT 4.56 00:34.9;  Surgeon: Jaye Fallow, MD;  Location: [facility];  Service: Ophthalmology;  Laterality: Left;   CAUTERY OF TURBINATES     COLONOSCOPY WITH PROPOFOL  N/A 07/19/2015   Procedure: COLONOSCOPY WITH PROPOFOL ;  Surgeon: Carmine Jinny, MD;  Location: [facility];  Service: Endoscopy;  Laterality: N/A;   COLONOSCOPY WITH PROPOFOL  N/A 03/21/2021   Procedure: COLONOSCOPY WITH PROPOFOL ;  Surgeon: Jinny Carmine, MD;  Location: [facility];  Service: Endoscopy;  Laterality: N/A;   COLONOSCOPY WITH PROPOFOL  N/A 04/18/2021   Procedure: COLONOSCOPY WITH PROPOFOL ;  Surgeon: Jinny Carmine, MD;  Location: [facility];  Service: Endoscopy;  Laterality: N/A;   COLONOSCOPY WITH PROPOFOL  N/A 04/24/2022   Procedure: COLONOSCOPY WITH PROPOFOL ;  Surgeon: Jinny Carmine, MD;  Location: [facility];  Service: Endoscopy;  Laterality: N/A;   ESOPHAGOGASTRODUODENOSCOPY N/A 12/26/2023   Procedure: EGD (ESOPHAGOGASTRODUODENOSCOPY);  Surgeon: Jinny Carmine, MD;  Location: [facility];  Service: Endoscopy;  Laterality: N/A;   EYE SURGERY     HERNIA REPAIR Left 20+ years ago   inguinal herniorrhapy   HOT HEMOSTASIS  12/26/2023   Procedure: EGD, WITH ARGON PLASMA COAGULATION;  Surgeon: Jinny Carmine, MD;  Location: [facility];  Service: Endoscopy;;   JOINT REPLACEMENT  left knee 2016   KNEE SURGERY     ? loose body/ chondral defect 19yrs   ROTATOR CUFF REPAIR     left   SPINE SURGERY  10-12 years ago   SUBMUCOSAL INJECTION  12/26/2023   Procedure: INJECTION, SUBMUCOSAL;  Surgeon: Jinny Carmine, MD;  Location: [facility];  Service: Endoscopy;;   TONSILLECTOMY     TOTAL  KNEE ARTHROPLASTY Left 06/22/2014   Procedure: TOTAL KNEE ARTHROPLASTY;  Surgeon: Fonda SHAUNNA Olmsted, MD;  Location: [facility] OR;  Service: Orthopedics;  Laterality: Left;    No Known Allergies  Outpatient Encounter Medications as of 01/06/2024  Medication Sig   acetaminophen  (TYLENOL ) 325 MG tablet Take 650 mg by mouth every 4 (four) hours as needed. (Patient taking differently: Take 650 mg by mouth every 8 (eight) hours as needed.)   ascorbic acid  (VITAMIN C ) 500 MG tablet Take 1 tablet (500 mg total) by mouth daily.   Boron 6 MG TABS Take by mouth daily.   clotrimazole-betamethasone (LOTRISONE) cream Apply topically. (Patient not taking: Reported on 01/08/2024)   cyanocobalamin  (VITAMIN B12) 1000 MCG tablet Take 1,000 mcg by mouth daily.   mirtazapine  (REMERON ) 7.5 MG tablet Take 1 tablet (7.5 mg total) by mouth at bedtime.   OVER THE COUNTER MEDICATION Take 20,000 mcg by mouth daily. Vitamin B12-methylfolate (Patient not taking: Reported on 01/08/2024)   pantoprazole  (PROTONIX ) 40 MG tablet Take 1 tablet (40 mg total) by mouth 2 (two) times daily for 30 days, THEN 1 tablet (40 mg total) daily. (Patient taking differently: Take one tablet by mouth twice daily. )   sucralfate  (CARAFATE ) 1 GM/10ML suspension Take 10 mLs (1 g total) by mouth 4 (four) times daily -  with meals and at bedtime.   tadalafil  (CIALIS ) 20 MG tablet TAKE ONE TABLET DAILY AS NEEDED FOR ERECTILE DYSFUNCTION (Patient not taking: Reported on 01/08/2024)   tamsulosin  (FLOMAX ) 0.4 MG CAPS capsule Take 2 capsules (0.8 mg total) by mouth daily.   Testosterone  75 MG PLLT by Implant route. (Patient not taking: Reported on 01/08/2024)   thyroid  (NP THYROID ) 60 MG tablet Take 1 tablet (60 mg total) by mouth daily.   Zinc  Oxide 10 % OINT Apply twice daily to sacral decubitus ulcer   Facility-Administered Encounter Medications as of 01/06/2024  Medication   [START ON 04/28/2024] denosumab  (PROLIA ) injection 60 mg    Review of  Systems  Immunization History  Administered Date(s) Administered   Fluad Quad(high Dose 65+) 04/27/2019, 03/31/2020, 01/31/2021   Hep A / Hep B 12/13/2015, 01/17/2016, 06/26/2016   Influenza Whole 02/26/2008   Influenza, High Dose Seasonal PF 04/23/2018, 02/02/2022   Influenza,inj,Quad PF,6+ Mos 03/12/2013, 02/24/2014   Influenza-Unspecified 02/09/2016   PFIZER(Purple Top)SARS-COV-2 Vaccination 06/24/2019, 07/15/2019, 04/28/2020   PNEUMOCOCCAL CONJUGATE-20 01/31/2021   Pneumococcal Conjugate-13 08/06/2013   Pneumococcal Polysaccharide-23 05/24/2011   Polio, Unspecified 09/27/2000   Td 05/28/2004, 09/23/2015   Tdap 02/13/2006, 06/06/2017   Typhoid Live 08/30/1995, 10/04/1995   Zoster Recombinant(Shingrix) 02/24/2022, 07/03/2023   Zoster, Live 05/30/2007   Pertinent  Health Maintenance Due  Topic Date Due   Colonoscopy  04/25/2023   INFLUENZA VACCINE  12/27/2023      07/03/2023    9:37 AM 08/06/2023    9:29 AM 09/23/2023    8:37 AM 11/28/2023   11:59 AM 12/13/2023   11:16 AM  Fall Risk  Falls in the past year? 0 0 1 0 1  Was there an injury with Fall?  0 0 0 0 0  Fall Risk Category Calculator 0 0 1 0 1  Patient at Risk for Falls Due to No Fall Risks No Fall Risks History of fall(s) No Fall Risks History of fall(s)  Fall risk Follow up Falls prevention discussed;Falls evaluation completed Falls evaluation completed;Education provided Falls evaluation completed Falls evaluation completed Falls evaluation completed   Functional Status Survey:    Vitals:   01/06/24 0926  BP: 125/78  Pulse: (!) 114  Resp: (!) 80  Temp: (!) 96.9 F (36.1 C)  SpO2: 95%  Weight: 160 lb 12.8 oz (72.9 kg)   Body mass index is 21.81 kg/m. Physical Exam Constitutional:      Comments: Thin, frail  Cardiovascular:     Rate and Rhythm: Normal rate.  Pulmonary:     Effort: Pulmonary effort is normal.  Abdominal:     General: There is distension.     Palpations: Abdomen is soft.     Labs  reviewed: Recent Labs    12/30/23 0549 12/31/23 0450 01/01/24 0423 01/06/24 0000  NA 136 133* 135 140  K 3.8 3.9 3.5 4.2  CL 106 107 106 105  CO2 21* 23 25 29*  GLUCOSE 101* 100* 118*  --   BUN 14 14 19  23*  CREATININE 0.62 0.59* 0.69 0.7  CALCIUM 7.5* 7.5* 7.7* 7.9*  MG 1.6* 1.8 1.7  --   PHOS 1.9* 2.2* 2.9  --    Recent Labs    12/30/23 0549 12/31/23 0450 01/01/24 0423  AST 171* 156* 134*  ALT 108* 101* 90*  ALKPHOS 164* 162* 193*  BILITOT 6.3* 5.8* 5.2*  PROT 5.0* 5.2* 4.8*  ALBUMIN 1.9* 2.1* 1.8*   Recent Labs    12/13/23 1205 12/13/23 1912 12/20/23 1054 12/20/23 1232 12/30/23 0549 12/31/23 0450 01/01/24 0423 01/06/24 0000  WBC 2.7*   < > 6.1   < > 6.5 5.8 6.7 5.8  NEUTROABS 1.8  --  5.1  --   --   --   --  4,385.00  HGB 12.2*   < > 11.2*   < > 10.0* 9.9* 9.9* 10.2*  HCT 35.3*   < > 32.3*   < > 30.5* 28.9* 29.3* 29*  MCV 104.6*   < > 107.1*   < > 107.8* 104.7* 105.8*  --   PLT 110.0*   < > 134.0*   < > 160 143* 144* 142*   < > = values in this interval not displayed.   Lab Results  Component Value Date   TSH 2.00 09/23/2023   Lab Results  Component Value Date   HGBA1C 4.4 (L) 09/23/2023   Lab Results  Component Value Date   CHOL 202 (H) 09/23/2023   HDL 106.60 09/23/2023   LDLCALC 84 09/23/2023   LDLDIRECT 74.0 09/23/2023   TRIG 61.0 09/23/2023   CHOLHDL 2 09/23/2023    Significant Diagnostic Results in last 30 days:  DG Chest Port 1 View Result Date: 12/26/2023 CLINICAL DATA:  Hypoxia. EXAM: PORTABLE CHEST 1 VIEW COMPARISON:  Chest radiograph dated 12/20/2023. FINDINGS: Shallow inspiration. Bilateral lower lung field opacities, new since the prior radiograph most consistent with pneumonia. No pleural effusion or pneumothorax. Biapical calcified plaques. Stable cardiac silhouette no acute osseous pathology. IMPRESSION: Bilateral lower lung field pneumonia. Electronically Signed   By: Vanetta Chou M.D.   On: 12/26/2023 16:21   CT Chest Wo  Contrast Result Date: 12/20/2023 CLINICAL DATA:  Respiratory illness. EXAM: CT CHEST WITHOUT CONTRAST TECHNIQUE:  Multidetector CT imaging of the chest was performed following the standard protocol without IV contrast. RADIATION DOSE REDUCTION: This exam was performed according to the departmental dose-optimization program which includes automated exposure control, adjustment of the mA and/or kV according to patient size and/or use of iterative reconstruction technique. COMPARISON:  Chest radiograph dated 12/20/2023. FINDINGS: Evaluation of this exam is limited in the absence of intravenous contrast. Cardiovascular: There is no cardiomegaly or pericardial effusion. There is 3 vessel coronary vascular calcification. Mild atherosclerotic calcification of the thoracic aorta. No evidence of bowel dilatation. The central pulmonary arteries are grossly unremarkable. Mediastinum/Nodes: No hilar or mediastinal adenopathy. The esophagus is grossly unremarkable. No mediastinal fluid collection. Lungs/Pleura: Mild bilateral lower lobe bronchiectasis. Diffuse ground-glass density in the lower lobes and lower lung fields as well as interstitial and subpleural densities likely represent pneumonia. Underlying interstitial lung disease is not excluded but favored less likely. Biapical calcified pleural plaques. Trace left pleural effusion. No pneumothorax. Upper Abdomen: Fatty liver and cirrhosis. A 3 mm nonobstructing left renal stone. Musculoskeletal: Degenerative changes of the spine. No acute osseous pathology. IMPRESSION: 1. Bilateral lower lobe and lower lung field predominant densities likely represent pneumonia. Clinical correlation and follow-up recommended. 2. Fatty liver and cirrhosis. 3. A 3 mm nonobstructing left renal stone. 4.  Aortic Atherosclerosis (ICD10-I70.0). Electronically Signed   By: Vanetta Chou M.D.   On: 12/20/2023 14:20   DG Chest 2 View Result Date: 12/20/2023 CLINICAL DATA:  Shortness of breath,  liver failure EXAM: CHEST - 2 VIEW COMPARISON:  12/13/2023 FINDINGS: Lower thoracic spondylosis. Atherosclerotic calcification of the aortic arch. Biapical pleuroparenchymal scarring with associated calcifications. Reticular interstitial accentuation especially posteriorly in the lower lobes, although judged to be mildly improved from 12/13/2023. IMPRESSION: 1. Reticular interstitial accentuation especially posteriorly in the lower lobes, although judged to be mildly improved from 12/13/2023. This could reflect resolving noncardiogenic edema or resolving infection. 2. Lower thoracic spondylosis. 3. Aortic Atherosclerosis (ICD10-I70.0). Electronically Signed   By: Ryan Salvage M.D.   On: 12/20/2023 13:15   CT ABDOMEN PELVIS W CONTRAST Result Date: 12/13/2023 EXAM: CT ABDOMEN AND PELVIS WITH CONTRAST 12/13/2023 11:11:54 PM TECHNIQUE: CT of the abdomen and pelvis was performed with the administration of intravenous contrast. Multiplanar reformatted images are provided for review. Automated exposure control, iterative reconstruction, and/or weight based adjustment of the mA/kV was utilized to reduce the radiation dose to as low as reasonably achievable. COMPARISON: MR abdomen dated 04/08/2022. CLINICAL HISTORY: Elevated LFTs, concerns for mass. Pt reports having bloodwork done today and was sent over by Doctor Tullo for abnormal results, pt unable to recall results. Pt reports having sore on his bottom x 2 months. Pt reports weakness x 1 week. Denies CP or SOB. FINDINGS: LOWER CHEST: Subpleural patchy opacities in the bilateral lower lobes favoring mild infection/pneumonia over postinfectious/inflammatory scarring. LIVER: Cirrhosis. Severe hepatic steatosis with scattered areas of focal fatty sparing, including in the posterior right hepatic lobe, although non-mass-like. GALLBLADDER AND BILE DUCTS: Gallbladder is unremarkable. No biliary ductal dilatation. SPLEEN: No acute abnormality. PANCREAS: No acute  abnormality. ADRENAL GLANDS: No acute abnormality. KIDNEYS, URETERS AND BLADDER: No stones in the kidneys or ureters. No hydronephrosis. No perinephric or periureteral stranding. Urinary bladder is unremarkable. GI AND BOWEL: Stomach demonstrates no acute abnormality. There is no bowel obstruction. No bowel wall thickening. Scattered colonic diverticulosis, without evidence of diverticulitis. Appendix is not discretely visualized. PERITONEUM AND RETROPERITONEUM: No ascites. No free air. VASCULATURE: Atherosclerotic calcifications of the abdominal aorta and branch vessels,  although patent. Portal vein is patent. LYMPH NODES: No lymphadenopathy. REPRODUCTIVE ORGANS: No acute abnormality. BONES AND SOFT TISSUES: No acute osseous abnormality. Small fat-containing right inguinal hernia. IMPRESSION: 1. Cirrhosis and severe hepatic steatosis. No suspicious hepatic mass. 2. Subpleural patchy opacities in the bilateral lower lobes, favoring mild infection/pneumonia over postinfectious/inflammatory scarring. Electronically signed by: Pinkie Pebbles MD 12/13/2023 11:18 PM EDT RP Workstation: [id number]   DG Chest 1 View Result Date: 12/13/2023 EXAM: 1 VIEW XRAY OF THE CHEST 12/13/2023 09:49:41 PM COMPARISON: 11/04/2019 CLINICAL HISTORY: Weakness. Table formatting from the original note was not included. Pt reports having bloodwork done today and was sent over by Doctor Tullo for abnormal results, pt unable to recall results. Pt reports having sore on his bottom x 2 months. Pt reports weakness x 1 week. FINDINGS: LUNGS AND PLEURA: No focal pulmonary opacity. No pulmonary edema. No pleural effusion. No pneumothorax. HEART AND MEDIASTINUM: No acute abnormality of the cardiac and mediastinal silhouettes. BONES AND SOFT TISSUES: No acute osseous abnormality. IMPRESSION: 1. No acute process. Electronically signed by: Pinkie Pebbles MD 12/13/2023 10:00 PM EDT RP Workstation: [id number]   US  ABDOMEN LIMITED RUQ  (LIVER/GB) Result Date: 12/13/2023 CLINICAL DATA:  Transaminitis. EXAM: ULTRASOUND ABDOMEN LIMITED RIGHT UPPER QUADRANT COMPARISON:  August 01, 2023 FINDINGS: Gallbladder: No gallstones or wall thickening visualized (2.4 mm). No sonographic Murphy sign noted by sonographer. Common bile duct: Diameter: 3.7 mm (partially visualized) Liver: No focal lesion identified. The liver parenchyma is coarse in echotexture and diffusely increased in echogenicity. Portal vein is patent on color Doppler imaging with normal direction of blood flow towards the liver. Other: None. IMPRESSION: Hepatic cirrhosis without focal liver lesions. Electronically Signed   By: Suzen Dials M.D.   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 Conservative management preferred due to risks of infection and bleeding from invasive procedures, especially given anemia and low hemoglobin. Potential for paracentesis discussed if symptoms worsen, such as pain, blood pressure changes, or shortness of breath. Emphasized judicious decision-making regarding invasive interventions. He prefers medication management initially. - Initiate diuretic therapy with spironolactone and/or Lasix to manage ascites. - Monitor for symptoms indicating need for paracentesis, such as abdominal pain, changes in blood pressure, or shortness of breath.  Anemia in the setting of cirrhosis Anemia likely related to cirrhosis, with low hemoglobin and recent transfusion. Increased risk of bleeding due to impaired liver function and potential hypercoagulable state discussed.  Family/ staff Communication: nursing  Labs/tests ordered:  Abdominal Ultrasound

## 2024-01-09 ENCOUNTER — Encounter: Payer: Self-pay | Admitting: Student

## 2024-01-14 DIAGNOSIS — K7460 Unspecified cirrhosis of liver: Secondary | ICD-10-CM | POA: Diagnosis not present

## 2024-01-14 DIAGNOSIS — K7682 Hepatic encephalopathy: Secondary | ICD-10-CM | POA: Diagnosis not present

## 2024-01-15 ENCOUNTER — Encounter: Payer: Self-pay | Admitting: Internal Medicine

## 2024-01-22 ENCOUNTER — Inpatient Hospital Stay: Admitting: Nurse Practitioner

## 2024-01-22 ENCOUNTER — Inpatient Hospital Stay: Payer: PPO | Attending: Oncology | Admitting: Oncology

## 2024-01-22 ENCOUNTER — Encounter: Payer: Self-pay | Admitting: Oncology

## 2024-01-22 ENCOUNTER — Inpatient Hospital Stay

## 2024-01-22 VITALS — BP 183/162 | HR 114 | Temp 96.3°F | Resp 19 | Ht 72.0 in | Wt 160.0 lb

## 2024-01-22 DIAGNOSIS — R7989 Other specified abnormal findings of blood chemistry: Secondary | ICD-10-CM | POA: Insufficient documentation

## 2024-01-22 DIAGNOSIS — K7030 Alcoholic cirrhosis of liver without ascites: Secondary | ICD-10-CM | POA: Diagnosis not present

## 2024-01-22 DIAGNOSIS — D649 Anemia, unspecified: Secondary | ICD-10-CM | POA: Insufficient documentation

## 2024-01-22 DIAGNOSIS — D539 Nutritional anemia, unspecified: Secondary | ICD-10-CM

## 2024-01-22 DIAGNOSIS — D696 Thrombocytopenia, unspecified: Secondary | ICD-10-CM | POA: Insufficient documentation

## 2024-01-22 LAB — CBC (CANCER CENTER ONLY)
HCT: 38.5 % — ABNORMAL LOW (ref 39.0–52.0)
Hemoglobin: 12.9 g/dL — ABNORMAL LOW (ref 13.0–17.0)
MCH: 35.2 pg — ABNORMAL HIGH (ref 26.0–34.0)
MCHC: 33.5 g/dL (ref 30.0–36.0)
MCV: 105.2 fL — ABNORMAL HIGH (ref 80.0–100.0)
Platelet Count: 170 K/uL (ref 150–400)
RBC: 3.66 MIL/uL — ABNORMAL LOW (ref 4.22–5.81)
RDW: 12.9 % (ref 11.5–15.5)
WBC Count: 5.8 K/uL (ref 4.0–10.5)
nRBC: 0 % (ref 0.0–0.2)

## 2024-01-22 LAB — IRON AND TIBC
Iron: 101 ug/dL (ref 45–182)
Saturation Ratios: 49 % — ABNORMAL HIGH (ref 17.9–39.5)
TIBC: 207 ug/dL — ABNORMAL LOW (ref 250–450)
UIBC: 106 ug/dL

## 2024-01-22 LAB — FOLATE: Folate: >23.2 ng/mL (ref >5.9)

## 2024-01-22 LAB — FERRITIN: Ferritin: 488 ng/mL — ABNORMAL HIGH (ref 24–336)

## 2024-01-22 LAB — VITAMIN B12: Vitamin B-12: 1464 pg/mL — ABNORMAL HIGH (ref 180–914)

## 2024-01-22 LAB — PROTIME-INR
INR: 1.2 (ref 0.8–1.2)
Prothrombin Time: 16.1 s — ABNORMAL HIGH (ref 11.4–15.2)

## 2024-01-22 LAB — TSH: TSH: 4.7 u[IU]/mL — ABNORMAL HIGH (ref 0.350–4.500)

## 2024-01-22 NOTE — Progress Notes (Signed)
 Patient states he's been feeling under the weather lately. He also had surgery 3 weeks ago due to stomach bleed.

## 2024-01-22 NOTE — Progress Notes (Signed)
 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  He was recently seen by Dr. Unk in August 2025.  He was seen by hematology Dr. Brutus in the past for elevated ferritin which fluctuates between 200-800 and has been mainly attributed to his alcohol consumption.  MRI liver in November 2023 showed no evidence of excessive iron deposition.  Evidence of cirrhosis.  Hemochromatosis testing negative.  Interval history-he lives at [facility] facility and overall feels poorly.  He is on diuretics for his cirrhosis and ascites  ECOG PS- 2 Pain scale- 0   Review of systems- Review of Systems  Constitutional:  Positive for malaise/fatigue. Negative for chills, fever and weight loss.  HENT:  Negative for congestion, ear discharge and nosebleeds.   Eyes:  Negative for blurred vision.  Respiratory:  Negative for cough, hemoptysis, sputum production, shortness of breath and wheezing.   Cardiovascular:  Negative for chest pain, palpitations, orthopnea and claudication.  Gastrointestinal:  Negative for abdominal pain, blood in stool, constipation, diarrhea, heartburn, melena, nausea and vomiting.  Genitourinary:  Negative for dysuria, flank pain, frequency, hematuria and urgency.  Musculoskeletal:  Negative for back pain,  joint pain and myalgias.  Skin:  Negative for rash.  Neurological:  Negative for dizziness, tingling, focal weakness, seizures, weakness and headaches.  Endo/Heme/Allergies:  Does not bruise/bleed easily.  Psychiatric/Behavioral:  Negative for depression and suicidal ideas. The patient does not have insomnia.       No Known Allergies   Past Medical History:  Diagnosis Date   Allergy    Arthritis    Cirrhosis (HCC)    Complication of anesthesia    :need catheter   Dysrhythmia    ? something told by reg dr  unable to say what it was   Esophageal tear    Hypothyroidism    Pneumonia    hx   Primary osteoarthritis of left knee 06/22/2014   Rotator cuff tear    Sleep apnea corrected- dental device     Past Surgical History:  Procedure Laterality Date   BACK SURGERY     blepharaplasty     CATARACT EXTRACTION W/PHACO Left 02/21/2021   Procedure: CATARACT EXTRACTION PHACO AND INTRAOCULAR LENS PLACEMENT (IOC) LEFT 4.56 00:34.9;  Surgeon: Jaye Fallow, MD;  Location: [facility];  Service: Ophthalmology;  Laterality: Left;   CAUTERY OF TURBINATES     COLONOSCOPY WITH PROPOFOL  N/A 07/19/2015   Procedure: COLONOSCOPY WITH PROPOFOL ;  Surgeon: Carmine Jinny, MD;  Location: [facility];  Service: Endoscopy;  Laterality: N/A;   COLONOSCOPY WITH PROPOFOL  N/A 03/21/2021   Procedure: COLONOSCOPY WITH PROPOFOL ;  Surgeon: Jinny Carmine, MD;  Location: [facility];  Service: Endoscopy;  Laterality: N/A;   COLONOSCOPY WITH PROPOFOL  N/A 04/18/2021   Procedure: COLONOSCOPY WITH PROPOFOL ;  Surgeon: Jinny Carmine, MD;  Location: [facility] END[facility];  Service: Endoscopy;  Laterality: N/A;   COLONOSCOPY WITH PROPOFOL  N/A 04/24/2022   Procedure:  COLONOSCOPY WITH PROPOFOL ;  Surgeon: Jinny Carmine, MD;  Location: [facility];  Service: Endoscopy;  Laterality: N/A;   ESOPHAGOGASTRODUODENOSCOPY N/A 12/26/2023   Procedure: EGD (ESOPHAGOGASTRODUODENOSCOPY);  Surgeon: Jinny Carmine, MD;   Location: [facility] END[facility]COPY;  Service: Endoscopy;  Laterality: N/A;   EYE SURGERY     HERNIA REPAIR Left 20+ years ago   inguinal herniorrhapy   HOT HEMOSTASIS  12/26/2023   Procedure: EGD, WITH ARGON PLASMA COAGULATION;  Surgeon: Jinny Carmine, MD;  Location: [facility] ENDOSCOPY;  Service: Endoscopy;;   JOINT REPLACEMENT  left knee 2016   KNEE SURGERY     ? loose body/ chondral defect 19yrs   ROTATOR CUFF REPAIR     left   SPINE SURGERY  10-12 years ago   SUBMUCOSAL INJECTION  12/26/2023   Procedure: INJECTION, SUBMUCOSAL;  Surgeon: Jinny Carmine, MD;  Location: [facility] END[facility]COPY;  Service: Endoscopy;;   TONSILLECTOMY     TOTAL KNEE ARTHROPLASTY Left 06/22/2014   Procedure: TOTAL KNEE ARTHROPLASTY;  Surgeon: Fonda SHAUNNA Olmsted, MD;  Location: [facility] OR;  Service: Orthopedics;  Laterality: Left;    Social History   Socioeconomic History   Marital status: Married    Spouse name: Not on file   Number of children: 2   Years of education: Not on file   Highest education level: Bachelor's degree (e.g., BA, AB, BS)  Occupational History   Occupation: textiles  Tobacco Use   Smoking status: Former    Current packs/day: 0.00    Average packs/day: 1 pack/day for 15.0 years (15.0 ttl pk-yrs)    Types: Cigarettes    Start date: 06/14/1968    Quit date: 06/15/1983    Years since quitting: 40.6   Smokeless tobacco: Current    Types: Chew  Vaping Use   Vaping status: Never Used  Substance and Sexual Activity   Alcohol use: Yes    Alcohol/week: 10.0 standard drinks of alcohol    Types: 10 Glasses of wine per week    Comment: pt reports 3 glasses per day   Drug use: No   Sexual activity: Not Currently  Other Topics Concern   Not on file  Social History Narrative   [employer] graduate   Avid hunter   Very active   Walks 1 hour daily   Social Drivers of Health   Financial Resource Strain: Low Risk  (01/14/2024)   Received from [facility]   Overall Financial Resource Strain (CARDIA)     Difficulty of Paying Living Expenses: Not hard at all  Food Insecurity: No Food Insecurity (01/14/2024)   [facility]   Hunger Vital Sign    Within the past 12 months, you worried that your food would run out before you got the money to buy more.: Never true    Within the past 12 months, the food you bought just didn't last and you didn't have money to get more.: Never true  Transportation Needs: No Transportation Needs (01/14/2024)   Received from [facility] - Transportation    In the past 12 months, has lack of transportation kept you from medical appointments or from getting medications?: No    Lack of Transportation (Non-Medical): No  Physical Activity: Sufficiently Active (07/03/2023)   Exercise Vital Sign    Days of Exercise per Week: 3 days    Minutes of Exercise per Session: 60 min  Stress: No Stress Concern Present (07/03/2023)   [employer] -  Occupational Stress Questionnaire    Feeling of Stress : Not at all  Recent Concern: Stress - Stress Concern Present (05/19/2023)   [employer] - Occupational Stress Questionnaire    Feeling of Stress : Rather much  Social Connections: Moderately Integrated (12/26/2023)   Social Connection and Isolation Panel    Frequency of Communication with Friends and Family: More than three times a week    Frequency of Social Gatherings with Friends and Family: More than three times a week    Attends Religious Services: Never    Database administrator or Organizations: Yes    Attends Engineer, structural: More than 4 times per year    Marital Status: Married  Catering manager Violence: Not At Risk (12/26/2023)   Humiliation, Afraid, Rape, and Kick questionnaire    Fear of Current or Ex-Partner: No    Emotionally Abused: No    Physically Abused: No    Sexually Abused: No    Family History  Problem Relation Age of Onset    Alcohol abuse Mother    Diabetes Mother    Mental retardation Mother    Arthritis Father    Hearing loss Father    Alcohol abuse Father      Current Outpatient Medications:    acetaminophen  (TYLENOL ) 325 MG tablet, Take 650 mg by mouth every 4 (four) hours as needed. (Patient taking differently: Take 650 mg by mouth every 8 (eight) hours as needed.), Disp: , Rfl:    ascorbic acid  (VITAMIN C ) 500 MG tablet, Take 1 tablet (500 mg total) by mouth daily., Disp: , Rfl:    Boron 6 MG TABS, Take by mouth daily., Disp: , Rfl:    cyanocobalamin  (VITAMIN B12) 1000 MCG tablet, Take 1,000 mcg by mouth daily., Disp: , Rfl:    folic acid  (FOLVITE ) 1 MG tablet, Take 1 mg by mouth., Disp: , Rfl:    furosemide  (LASIX ) 20 MG tablet, Take 20 mg by mouth daily., Disp: , Rfl:    mirtazapine  (REMERON ) 7.5 MG tablet, Take 1 tablet (7.5 mg total) by mouth at bedtime., Disp: 90 tablet, Rfl: 1   Multiple Vitamin (MULTI-VITAMIN) tablet, Take 1 tablet by mouth daily., Disp: , Rfl:    rifaximin  (XIFAXAN ) 550 MG TABS tablet, Take 550 mg by mouth., Disp: , Rfl:    sertraline  (ZOLOFT ) 50 MG tablet, Take 50 mg by mouth daily., Disp: , Rfl:    spironolactone  (ALDACTONE ) 50 MG tablet, Take 50 mg by mouth daily., Disp: , Rfl:    sucralfate  (CARAFATE ) 1 GM/10ML suspension, Take 10 mLs (1 g total) by mouth 4 (four) times daily -  with meals and at bedtime., Disp: , Rfl:    tamsulosin  (FLOMAX ) 0.4 MG CAPS capsule, Take 2 capsules (0.8 mg total) by mouth daily., Disp: 90 capsule, Rfl: 1   thiamine  (VITAMIN B1) 100 MG tablet, Take 100 mg by mouth., Disp: , Rfl:    thyroid  (NP THYROID ) 60 MG tablet, Take 1 tablet (60 mg total) by mouth daily., Disp: 90 tablet, Rfl: 1   Zinc  Oxide 10 % OINT, Apply twice daily to sacral decubitus ulcer, Disp: 85 g, Rfl: 2   clotrimazole-betamethasone (LOTRISONE) cream, Apply topically. (Patient not taking: Reported on 01/22/2024), Disp: , Rfl:    OVER THE COUNTER MEDICATION, Take 20,000 mcg by mouth  daily. Vitamin B12-methylfolate (Patient not taking: Reported on 01/22/2024), Disp: , Rfl:    pantoprazole  (PROTONIX ) 40 MG tablet, Take 1 tablet (40 mg total)  by mouth 2 (two) times daily for 30 days, THEN 1 tablet (40 mg total) daily. (Patient taking differently: Take one tablet by mouth twice daily. ), Disp: , Rfl:    tadalafil  (CIALIS ) 20 MG tablet, TAKE ONE TABLET DAILY AS NEEDED FOR ERECTILE DYSFUNCTION (Patient not taking: Reported on 01/22/2024), Disp: 90 tablet, Rfl: 3   Testosterone  75 MG PLLT, by Implant route. (Patient not taking: Reported on 01/22/2024), Disp: , Rfl:   Current Facility-Administered Medications:    [START ON 04/28/2024] denosumab  (PROLIA ) injection 60 mg, 60 mg, Subcutaneous, Q6 months, Marylynn Verneita CROME, MD  Physical exam:  Vitals:   01/22/24 1045  BP: (!) 183/162  Pulse: (!) 114  Resp: 19  Temp: (!) 96.3 F (35.7 C)  TempSrc: Tympanic  SpO2: 100%  Weight: 160 lb (72.6 kg)  Height: 6' (1.829 m)   Physical Exam Cardiovascular:     Rate and Rhythm: Normal rate and regular rhythm.     Heart sounds: Normal heart sounds.  Pulmonary:     Effort: Pulmonary effort is normal.     Breath sounds: Normal breath sounds.  Abdominal:     General: Bowel sounds are normal.     Palpations: Abdomen is soft.     Comments: Mild distended.  Ascites positive  Skin:    General: Skin is warm and dry.  Neurological:     Mental Status: He is alert and oriented to person, place, and time.      I have personally reviewed labs listed below:    Latest Ref Rng & Units 01/06/2024   12:00 AM  CMP  BUN 4 - 21 23      Creatinine 0.6 - 1.3 0.7      Sodium 137 - 147 140      Potassium 3.5 - 5.1 mEq/L 4.2      Chloride 99 - 108 105      CO2 13 - 22 29      Calcium 8.7 - 10.7 7.9         This result is from an external source.      Latest Ref Rng & Units 01/22/2024   12:02 PM  CBC  WBC 4.0 - 10.5 K/uL 5.8   Hemoglobin 13.0 - 17.0 g/dL 87.0   Hematocrit 60.9 - 52.0 % 38.5    Platelets 150 - 400 K/uL 170    I have personally reviewed Radiology images listed below: No images are attached to the encounter.  DG Chest Port 1 View Result Date: 12/26/2023 CLINICAL DATA:  Hypoxia. EXAM: PORTABLE CHEST 1 VIEW COMPARISON:  Chest radiograph dated 12/20/2023. FINDINGS: Shallow inspiration. Bilateral lower lung field opacities, new since the prior radiograph most consistent with pneumonia. No pleural effusion or pneumothorax. Biapical calcified plaques. Stable cardiac silhouette no acute osseous pathology. IMPRESSION: Bilateral lower lung field pneumonia. Electronically Signed   By: Vanetta Chou M.D.   On: 12/26/2023 16:21     Assessment and plan- Patient is a 83 y.o. 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  Iron studies were checked 6 months ago and were not indicative of iron deficiency.  I am checking ferritin and iron studies B12 folate and TSH to workup his macrocytic anemia.  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  Patient  states that he has not gone back to drinking alcohol since July 2025 and we will see if his anemia improves with that.  HCC surveillance: CT abdomen and pelvis with contrast in July 2025 did not show any evidence of hepatic lesions.  I am checking his AFP today and again.  He will need ultrasound  every 6 months for [facility] surveillance  Elevated ferritin: Likely secondary to alcoholic liver disease and cirrhosis.  Hemochromatosis testing negative.  Continue to monitor  I will see him back in 3 months with labs   Visit Diagnosis 1. Thrombocytopenia (HCC)   2. Elevated ferritin   3. Alcoholic cirrhosis of liver without ascites (HCC)   4. Macrocytic anemia      Dr. Annah Skene, MD, MPH [facility] [phone] 01/22/2024 3:55 PM

## 2024-01-23 ENCOUNTER — Encounter: Payer: Self-pay | Admitting: Nurse Practitioner

## 2024-01-23 ENCOUNTER — Non-Acute Institutional Stay (SKILLED_NURSING_FACILITY): Payer: Self-pay | Admitting: Nurse Practitioner

## 2024-01-23 DIAGNOSIS — E43 Unspecified severe protein-calorie malnutrition: Secondary | ICD-10-CM

## 2024-01-23 DIAGNOSIS — D62 Acute posthemorrhagic anemia: Secondary | ICD-10-CM

## 2024-01-23 DIAGNOSIS — R000 Tachycardia, unspecified: Secondary | ICD-10-CM

## 2024-01-23 DIAGNOSIS — J189 Pneumonia, unspecified organism: Secondary | ICD-10-CM | POA: Diagnosis not present

## 2024-01-23 DIAGNOSIS — K7030 Alcoholic cirrhosis of liver without ascites: Secondary | ICD-10-CM | POA: Diagnosis not present

## 2024-01-23 DIAGNOSIS — N401 Enlarged prostate with lower urinary tract symptoms: Secondary | ICD-10-CM | POA: Diagnosis not present

## 2024-01-23 DIAGNOSIS — R350 Frequency of micturition: Secondary | ICD-10-CM

## 2024-01-23 DIAGNOSIS — J9601 Acute respiratory failure with hypoxia: Secondary | ICD-10-CM | POA: Diagnosis not present

## 2024-01-23 DIAGNOSIS — E039 Hypothyroidism, unspecified: Secondary | ICD-10-CM | POA: Diagnosis not present

## 2024-01-23 LAB — AFP TUMOR MARKER: AFP, Serum, Tumor Marker: 3.8 ng/mL (ref 0.0–6.4)

## 2024-01-23 MED ORDER — PROPRANOLOL HCL 20 MG PO TABS: 20.0000 mg | ORAL_TABLET | Freq: Two times a day (BID) | ORAL | 0 refills | Status: DC

## 2024-01-23 MED ORDER — PANTOPRAZOLE SODIUM 40 MG PO TBEC: DELAYED_RELEASE_TABLET | ORAL | 0 refills | Status: DC

## 2024-01-23 MED ORDER — SUCRALFATE 1 GM/10ML PO SUSP: 1.0000 g | Freq: Three times a day (TID) | ORAL | 0 refills | Status: DC

## 2024-01-23 MED ORDER — TAMSULOSIN HCL 0.4 MG PO CAPS: 0.8000 mg | ORAL_CAPSULE | Freq: Every day | ORAL | 0 refills | Status: AC

## 2024-01-23 MED ORDER — SPIRONOLACTONE 50 MG PO TABS: 100.0000 mg | ORAL_TABLET | Freq: Every day | ORAL | 0 refills | Status: DC

## 2024-01-23 MED ORDER — THYROID 60 MG PO TABS: 60.0000 mg | ORAL_TABLET | Freq: Every day | ORAL | 0 refills | Status: AC

## 2024-01-23 MED ORDER — MIRTAZAPINE 7.5 MG PO TABS: 7.5000 mg | ORAL_TABLET | Freq: Every day | ORAL | 0 refills | Status: DC

## 2024-01-23 MED ORDER — FUROSEMIDE 20 MG PO TABS: 40.0000 mg | ORAL_TABLET | Freq: Every day | ORAL | 0 refills | Status: DC

## 2024-01-23 MED ORDER — RIFAXIMIN 550 MG PO TABS: 550.0000 mg | ORAL_TABLET | Freq: Two times a day (BID) | ORAL | 0 refills | Status: AC

## 2024-01-23 NOTE — Progress Notes (Signed)
 Location:  Other [place].  Nursing Home Room Number: [facility] Place of Service:  SNF [phone]) Harlene An, NP  PCP: Marylynn Verneita CROME, MD  Patient Care Team: Marylynn Verneita CROME, MD as PCP - General (Internal Medicine) Darliss Rogue, MD as PCP - Cardiology (Cardiology) Jinny Carmine, MD as Consulting Physician (Gastroenterology) Melanee Annah BROCKS, MD as Consulting Physician (Oncology)  Extended Emergency Contact Information Primary Emergency Contact: Nagorski,Thomas judson Mobile Phone: [phone] Relation: Son Interpreter needed? No Secondary Emergency Contact: Kellison,sarah Mobile Phone: [phone] Relation: Daughter  Goals of care: Advanced Directive information    01/22/2024   10:49 AM  Advanced Directives  Does Patient Have a Medical Advance Directive? Yes  Type of Estate agent of [place];Living will     Chief Complaint  Patient presents with   Discharge Note    Discharge Note.     HPI:  Pt is a 83 y.o. male seen today for Discharge. Pt has been at [facility] for rehab after hospitalization for melena and required transfusions. 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 He was suppose to go home last week but due to weakness stayed at facility another week. He is doing much better at this time with ambulation and personal care.  He reports he is ready to go home and has no concerns at this time No signs of melena, no increase in confusion. Bowels moving well.     Past Medical History:  Diagnosis Date   Allergy    Arthritis    Cirrhosis (HCC)    Complication of anesthesia    :need catheter   Dysrhythmia    ? something told by reg dr  unable to say what it was   Esophageal tear    Hypothyroidism    Pneumonia    hx   Primary osteoarthritis of left knee 06/22/2014   Rotator cuff tear    Sleep apnea corrected- dental device   Past Surgical History:  Procedure Laterality Date   BACK SURGERY      blepharaplasty     CATARACT EXTRACTION W/PHACO Left 02/21/2021   Procedure: CATARACT EXTRACTION PHACO AND INTRAOCULAR LENS PLACEMENT (IOC) LEFT 4.56 00:34.9;  Surgeon: Jaye Fallow, MD;  Location[facility];  Service: Ophthalmology;  Laterality: Left;   CAUTERY OF TURBINATES     COLONOSCOPY WITH PROPOFOL  N/A 07/19/2015   Procedure: COLONOSCOPY WITH PROPOFOL ;  Surgeon: Carmine Jinny, MD;  Location: [facility] ENDOSCOPY;  Service: Endoscopy;  Laterality: N/A;   COLONOSCOPY WITH PROPOFOL  N/A 03/21/2021   Procedure: COLONOSCOPY WITH PROPOFOL ;  Surgeon: Jinny Carmine, MD;  Location: [facility] ENDOSCOPY;  Service: Endoscopy;  Laterality: N/A;   COLONOSCOPY WITH PROPOFOL  N/A 04/18/2021   Procedure: COLONOSCOPY WITH PROPOFOL ;  Surgeon: Jinny Carmine, MD;  Location: [facility] ENDOSCOPY;  Service: Endoscopy;  Laterality: N/A;   COLONOSCOPY WITH PROPOFOL  N/A 04/24/2022   Procedure: COLONOSCOPY WITH PROPOFOL ;  Surgeon: Jinny Carmine, MD;  Location: [facility] ENDOSCOPY;  Service: Endoscopy;  Laterality: N/A;   ESOPHAGOGASTRODUODENOSCOPY N/A 12/26/2023   Procedure: EGD (ESOPHAGOGASTRODUODENOSCOPY);  Surgeon: Jinny Carmine, MD;  Location: [facility] ENDOSCOPY;  Service: Endoscopy;  Laterality: N/A;   EYE SURGERY     HERNIA REPAIR Left 20+ years ago   inguinal herniorrhapy   HOT HEMOSTASIS  12/26/2023   Procedure: EGD, WITH ARGON PLASMA COAGULATION;  Surgeon: Jinny Carmine, MD;  Location: [facility] ENDOSCOPY;  Service: Endoscopy;;   JOINT REPLACEMENT  left knee 2016   KNEE SURGERY     ? loose  body/ chondral defect 8yrs   ROTATOR CUFF REPAIR     left   SPINE SURGERY  10-12 years ago   SUBMUCOSAL INJECTION  12/26/2023   Procedure: INJECTION, SUBMUCOSAL;  Surgeon: Jinny Carmine, MD;  Location: [facility] ENDOSCOPY;  Service: Endoscopy;;   TONSILLECTOMY     TOTAL KNEE ARTHROPLASTY Left 06/22/2014   Procedure: TOTAL KNEE ARTHROPLASTY;  Surgeon: Fonda SHAUNNA Olmsted, MD;  Location: [facility] OR;  Service: Orthopedics;  Laterality: Left;    No Known  Allergies  Outpatient Encounter Medications as of 01/23/2024  Medication Sig   acetaminophen  (TYLENOL ) 325 MG tablet Take 650 mg by mouth every 4 (four) hours as needed. (Patient taking differently: Take 650 mg by mouth every 8 (eight) hours as needed.)   ascorbic acid  (VITAMIN C ) 500 MG tablet Take 1 tablet (500 mg total) by mouth daily.   Boron 6 MG TABS Take by mouth daily.   cyanocobalamin  (VITAMIN B12) 1000 MCG tablet Take 1,000 mcg by mouth daily.   folic acid  (FOLVITE ) 1 MG tablet Take 1 mg by mouth.   furosemide  (LASIX ) 20 MG tablet Take 20 mg by mouth daily. (Patient taking differently: Take 40 mg by mouth daily.)   mirtazapine  (REMERON ) 7.5 MG tablet Take 1 tablet (7.5 mg total) by mouth at bedtime.   Multiple Vitamin (MULTI-VITAMIN) tablet Take 1 tablet by mouth daily.   pantoprazole  (PROTONIX ) 40 MG tablet Take 1 tablet (40 mg total) by mouth 2 (two) times daily for 30 days, THEN 1 tablet (40 mg total) daily. (Patient taking differently: Take one tablet by mouth twice daily. )   rifaximin  (XIFAXAN ) 550 MG TABS tablet Take 550 mg by mouth. (Patient taking differently: Take 550 mg by mouth 2 (two) times daily.)   spironolactone  (ALDACTONE ) 50 MG tablet Take 50 mg by mouth daily. (Patient taking differently: Take 100 mg by mouth daily.)   sucralfate  (CARAFATE ) 1 GM/10ML suspension Take 10 mLs (1 g total) by mouth 4 (four) times daily -  with meals and at bedtime.   tamsulosin  (FLOMAX ) 0.4 MG CAPS capsule Take 2 capsules (0.8 mg total) by mouth daily.   thyroid  (NP THYROID ) 60 MG tablet Take 1 tablet (60 mg total) by mouth daily.   clotrimazole-betamethasone (LOTRISONE) cream Apply topically. (Patient not taking: Reported on 01/23/2024)   OVER THE COUNTER MEDICATION Take 20,000 mcg by mouth daily. Vitamin B12-methylfolate (Patient not taking: Reported on 01/23/2024)   sertraline  (ZOLOFT ) 50 MG tablet Take 50 mg by mouth daily. (Patient not taking: Reported on 01/23/2024)   tadalafil  (CIALIS )  20 MG tablet TAKE ONE TABLET DAILY AS NEEDED FOR ERECTILE DYSFUNCTION (Patient not taking: Reported on 01/23/2024)   Testosterone  75 MG PLLT by Implant route. (Patient not taking: Reported on 01/23/2024)   thiamine  (VITAMIN B1) 100 MG tablet Take 100 mg by mouth. (Patient not taking: Reported on 01/23/2024)   Zinc  Oxide 10 % OINT Apply twice daily to sacral decubitus ulcer (Patient not taking: Reported on 01/23/2024)   Facility-Administered Encounter Medications as of 01/23/2024  Medication   [START ON 04/28/2024] denosumab  (PROLIA ) injection 60 mg    Review of Systems  Constitutional:  Negative for activity change, appetite change, fatigue and unexpected weight change.  HENT:  Negative for congestion and hearing loss.   Eyes: Negative.   Respiratory:  Negative for cough and shortness of breath.   Cardiovascular:  Negative for chest pain, palpitations and leg swelling.  Gastrointestinal:  Negative for abdominal pain, constipation and diarrhea.  Genitourinary:  Negative  for difficulty urinating and dysuria.  Musculoskeletal:  Negative for arthralgias and myalgias.  Skin:  Negative for color change and wound.  Neurological:  Negative for dizziness and weakness.  Psychiatric/Behavioral:  Negative for agitation, behavioral problems and confusion.      Immunization History  Administered Date(s) Administered   Fluad Quad(high Dose 65+) 04/27/2019, 03/31/2020, 01/31/2021   Hep A / Hep B 12/13/2015, 01/17/2016, 06/26/2016   INFLUENZA, HIGH DOSE SEASONAL PF 04/23/2018, 02/02/2022   Influenza Whole 02/26/2008   Influenza,inj,Quad PF,6+ Mos 03/12/2013, 02/24/2014   Influenza-Unspecified 02/09/2016   PFIZER(Purple Top)SARS-COV-2 Vaccination 06/24/2019, 07/15/2019, 04/28/2020   PNEUMOCOCCAL CONJUGATE-20 01/31/2021   Pneumococcal Conjugate-13 08/06/2013   Pneumococcal Polysaccharide-23 05/24/2011   Polio, Unspecified 09/27/2000   Td 05/28/2004, 09/23/2015   Tdap 02/13/2006, 06/06/2017   Typhoid  Live 08/30/1995, 10/04/1995   Zoster Recombinant(Shingrix) 02/24/2022, 07/03/2023   Zoster, Live 05/30/2007   Pertinent  Health Maintenance Due  Topic Date Due   Colonoscopy  04/25/2023   INFLUENZA VACCINE  12/27/2023      07/03/2023    9:37 AM 08/06/2023    9:29 AM 09/23/2023    8:37 AM 11/28/2023   11:59 AM 12/13/2023   11:16 AM  Fall Risk  Falls in the past year? 0 0 1 0 1  Was there an injury with Fall? 0 0 0 0 0  Fall Risk Category Calculator 0 0 1 0 1  Patient at Risk for Falls Due to No Fall Risks No Fall Risks History of fall(s) No Fall Risks History of fall(s)  Fall risk Follow up Falls prevention discussed;Falls evaluation completed Falls evaluation completed;Education provided Falls evaluation completed Falls evaluation completed Falls evaluation completed   Functional Status Survey:    Vitals:   01/23/24 0835  BP: 122/66  Pulse: (!) 117  Resp: 20  Temp: (!) 97.4 F (36.3 C)  SpO2: 96%  Weight: 160 lb 3.2 oz (72.7 kg)  Height: 6' (1.829 m)   Body mass index is 21.73 kg/m.  Wt Readings from Last 3 Encounters:  01/23/24 160 lb 3.2 oz (72.7 kg)  01/22/24 160 lb (72.6 kg)  01/08/24 160 lb 12.8 oz (72.9 kg)    Physical Exam Constitutional:      General: He is not in acute distress.    Appearance: He is well-developed. He is not diaphoretic.  HENT:     Head: Normocephalic and atraumatic.     Right Ear: External ear normal.     Left Ear: External ear normal.     Mouth/Throat:     Pharynx: No oropharyngeal exudate.  Eyes:     Conjunctiva/sclera: Conjunctivae normal.     Pupils: Pupils are equal, round, and reactive to light.  Cardiovascular:     Rate and Rhythm: Normal rate and regular rhythm.     Heart sounds: Normal heart sounds.  Pulmonary:     Effort: Pulmonary effort is normal.     Breath sounds: Normal breath sounds.  Abdominal:     General: Bowel sounds are normal.     Palpations: Abdomen is soft.  Musculoskeletal:        General: No tenderness.      Cervical back: Normal range of motion and neck supple.     Right lower leg: No edema.     Left lower leg: No edema.  Skin:    General: Skin is warm and dry.  Neurological:     Mental Status: He is alert and oriented to person, place, and time. Mental status  is at baseline.     Labs reviewed: Recent Labs    12/30/23 0549 12/31/23 0450 01/01/24 0423 01/06/24 0000  NA 136 133* 135 140  K 3.8 3.9 3.5 4.2  CL 106 107 106 105  CO2 21* 23 25 29*  GLUCOSE 101* 100* 118*  --   BUN 14 14 19  23*  CREATININE 0.62 0.59* 0.69 0.7  CALCIUM 7.5* 7.5* 7.7* 7.9*  MG 1.6* 1.8 1.7  --   PHOS 1.9* 2.2* 2.9  --    Recent Labs    12/30/23 0549 12/31/23 0450 01/01/24 0423  AST 171* 156* 134*  ALT 108* 101* 90*  ALKPHOS 164* 162* 193*  BILITOT 6.3* 5.8* 5.2*  PROT 5.0* 5.2* 4.8*  ALBUMIN 1.9* 2.1* 1.8*   Recent Labs    12/13/23 1205 12/13/23 1912 12/20/23 1054 12/20/23 1232 12/31/23 0450 01/01/24 0423 01/06/24 0000 01/22/24 1202  WBC 2.7*   < > 6.1   < > 5.8 6.7 5.8 5.8  NEUTROABS 1.8  --  5.1  --   --   --  4,385.00  --   HGB 12.2*   < > 11.2*   < > 9.9* 9.9* 10.2* 12.9*  HCT 35.3*   < > 32.3*   < > 28.9* 29.3* 29* 38.5*  MCV 104.6*   < > 107.1*   < > 104.7* 105.8*  --  105.2*  PLT 110.0*   < > 134.0*   < > 143* 144* 142* 170   < > = values in this interval not displayed.   Lab Results  Component Value Date   TSH 4.700 (H) 01/22/2024   Lab Results  Component Value Date   HGBA1C 4.4 (L) 09/23/2023   Lab Results  Component Value Date   CHOL 202 (H) 09/23/2023   HDL 106.60 09/23/2023   LDLCALC 84 09/23/2023   LDLDIRECT 74.0 09/23/2023   TRIG 61.0 09/23/2023   CHOLHDL 2 09/23/2023    Significant Diagnostic Results in last 30 days:  DG Chest Port 1 View Result Date: 12/26/2023 CLINICAL DATA:  Hypoxia. EXAM: PORTABLE CHEST 1 VIEW COMPARISON:  Chest radiograph dated 12/20/2023. FINDINGS: Shallow inspiration. Bilateral lower lung field opacities, new since the prior  radiograph most consistent with pneumonia. No pleural effusion or pneumothorax. Biapical calcified plaques. Stable cardiac silhouette no acute osseous pathology. IMPRESSION: Bilateral lower lung field pneumonia. Electronically Signed   By: Vanetta Chou M.D.   On: 12/26/2023 16:21    Assessment/Plan 1. Alcoholic cirrhosis, unspecified whether ascites present (HCC) (Primary) -jaundice and weakness has improved Continue to not consume ETOH hopefully this will continue once he goes home Followed by GI at [facility] s time - furosemide  (LASIX ) 20 MG tablet; Take 2 tablets (40 mg total) by mouth daily.  Dispense: 60 tablet; Refill: 0 - pantoprazole  (PROTONIX ) 40 MG tablet; Take one tablet by mouth twice daily.  Dispense: 60 tablet; Refill: 0 - rifaximin  (XIFAXAN ) 550 MG TABS tablet; Take 1 tablet (550 mg total) by mouth 2 (two) times daily.  Dispense: 60 tablet; Refill: 0 - spironolactone  (ALDACTONE ) 50 MG tablet; Take 2 tablets (100 mg total) by mouth daily.  Dispense: 60 tablet; Refill: 0 - sucralfate  (CARAFATE ) 1 GM/10ML suspension; Take 10 mLs (1 g total) by mouth 4 (four) times daily -  with meals and at bedtime for 12 days.  Dispense: 473 mL; Refill: 0  2. Benign prostatic hyperplasia with urinary frequency Stable at on current regimen, will continue.  - tamsulosin  (FLOMAX ) 0.4 MG  CAPS capsule; Take 2 capsules (0.8 mg total) by mouth daily.  Dispense: 60 capsule; Refill: 0  4. Protein-calorie malnutrition, severe (HCC) Encouraged proper protein intake.  - mirtazapine  (REMERON ) 7.5 MG tablet; Take 1 tablet (7.5 mg total) by mouth at bedtime.  Dispense: 30 tablet; Refill: 0  5. ABLA (acute blood loss anemia) From GI bleed, followed by hematology due to macrocytic anemia Hgb stable at this time and improved on last lab  6. Hypothyroidism, unspecified type Continue on NP thyroid  - thyroid  (NP THYROID ) 60 MG tablet; Take 1 tablet (60 mg total) by mouth daily.  Dispense: 30 tablet; Refill: 0  6.  Tachycardia Noted to have tachycardia since hospitalization Will start propranolol  20 mg BID and have him follow up with PCP regarding HR and BP in 1 week.  - propranolol  (INDERAL ) 20 MG tablet; Take 1 tablet (20 mg total) by mouth 2 (two) times daily.  Dispense: 60 tablet; Refill: 0  pt is stable for discharge-will need PT/OT/ per home health. DME needed includes . Rx sent via epic for 1 month supply, will need to follow up with PCP within 2 weeks. Appt scheduld for 01/31/2024   Harlene POUR. Caro BODILY [facility] [phone]

## 2024-01-24 ENCOUNTER — Telehealth: Payer: Self-pay

## 2024-01-24 NOTE — Transitions of Care (Post Inpatient/ED Visit) (Signed)
   01/24/2024  Name: Tony Luna MRN: [id number] DOB: [birth year]  Today's TOC FU Call Status: Today's TOC FU Call Status:: Unsuccessful Call (1st Attempt) Unsuccessful Call (1st Attempt) Date: 01/24/24  Attempted to reach the patient regarding the most recent Inpatient/ED visit.  Follow Up Plan: Additional outreach attempts will be made to reach the patient to complete the Transitions of Care (Post Inpatient/ED visit) call.   Signature Julian Lemmings, LPN [facility] Nurse Health Advisor Direct Dial [phone]

## 2024-01-27 NOTE — Transitions of Care (Post Inpatient/ED Visit) (Signed)
 01/27/2024  Name: Tony Luna MRN: [id number] DOB: 1941/03/25  Today's TOC FU Call Status: Today's TOC FU Call Status:: Successful TOC FU Call Completed Unsuccessful Call (1st Attempt) Date: 01/24/24 [facility] FU Call Complete Date: 01/27/24 Patient's Name and Date of Birth confirmed.  Transition Care Management Follow-up Telephone Call Date of Discharge: 12/23/23 Discharge Facility: Other [facility]) Name of Other ([facility]) Discharge Facility: [facility] Type of Discharge: Inpatient Admission Primary Inpatient Discharge Diagnosis:: anemia How have you been since you were released from the hospital?: Better Any questions or concerns?: No  Items Reviewed: Did you receive and understand the discharge instructions provided?: Yes Medications obtained,verified, and reconciled?: Yes (Medications Reviewed) Any new allergies since your discharge?: No Dietary orders reviewed?: Yes Do you have support at home?: Yes People in Home [RPT]: spouse, child(ren), adult  Medications Reviewed Today: Medications Reviewed Today     Reviewed by Emmitt Pan, LPN (Licensed Practical Nurse) on 01/27/24 at 1102  Med List Status: <None>   Medication Order Taking? Sig Documenting Provider Last Dose Status Informant  acetaminophen  (TYLENOL ) 325 MG tablet [id number] Yes Take 650 mg by mouth every 4 (four) hours as needed.  Patient taking differently: Take 650 mg by mouth every 8 (eight) hours as needed.   [provider]  Active   ascorbic acid  (VITAMIN C ) 500 MG tablet [id number] Yes Take 1 tablet (500 mg total) by mouth daily. Von Bellis, MD  Active   Boron 6 MG TABS [id number] Yes Take by mouth daily. [provider]  Active Child  clotrimazole-betamethasone (LOTRISONE) cream [id number] Yes Apply topically. [provider]  Active Child  cyanocobalamin  (VITAMIN B12) 1000 MCG tablet [id number] Yes Take 1,000 mcg by mouth daily. [provider]  Active    denosumab  (PROLIA ) injection 60 mg [id number]   Tullo, Teresa L, MD  Active   folic acid  (FOLVITE ) 1 MG tablet [id number] Yes Take 1 mg by mouth. [provider]  Active   furosemide  (LASIX ) 20 MG tablet [id number] Yes Take 2 tablets (40 mg total) by mouth daily. Caro Harlene POUR, NP  Active   mirtazapine  (REMERON ) 7.5 MG tablet [id number] Yes Take 1 tablet (7.5 mg total) by mouth at bedtime. Caro Harlene POUR, NP  Active   Multiple Vitamin (MULTI-VITAMIN) tablet [id number] Yes Take 1 tablet by mouth daily. [provider]  Active   OVER THE COUNTER MEDICATION [id number]  Take 20,000 mcg by mouth daily. Vitamin B12-methylfolate  Patient not taking: Reported on 01/27/2024   [provider]  Active Child  pantoprazole  (PROTONIX ) 40 MG tablet [id number] Yes Take one tablet by mouth twice daily. Caro Harlene POUR, NP  Active   propranolol  (INDERAL ) 20 MG tablet [id number] Yes Take 1 tablet (20 mg total) by mouth 2 (two) times daily. Caro Harlene POUR, NP  Active   rifaximin  (XIFAXAN ) 550 MG TABS tablet [id number] Yes Take 1 tablet (550 mg total) by mouth 2 (two) times daily. Caro Harlene POUR, NP  Active   sertraline  (ZOLOFT ) 50 MG tablet [id number] Yes Take 50 mg by mouth daily. [provider]  Active   spironolactone  (ALDACTONE ) 50 MG tablet [id number] Yes Take 2 tablets (100 mg total) by mouth daily. Caro Harlene POUR, NP  Active   sucralfate  (CARAFATE ) 1 GM/10ML suspension [id number] Yes Take 10 mLs (1 g total) by mouth 4 (four) times daily -  with meals and at bedtime for 12 days. Caro Harlene POUR, NP  Active   tadalafil  (CIALIS ) 20 MG tablet  [id number]  TAKE ONE TABLET DAILY AS NEEDED FOR ERECTILE DYSFUNCTION  Patient not taking: Reported on 01/27/2024   Marylynn Verneita CROME, MD  Active Child  tamsulosin  (FLOMAX ) 0.4 MG CAPS capsule [id number] Yes Take 2 capsules (0.8 mg total) by mouth daily. Caro Harlene POUR, NP  Active   Testosterone  75 MG PLLT [id number]  by Implant  route.  Patient not taking: Reported on 01/27/2024   [provider]  Active Child  thiamine  (VITAMIN B1) 100 MG tablet [id number]  Take 100 mg by mouth.  Patient not taking: Reported on 01/27/2024   [provider]  Active   thyroid  (NP THYROID ) 60 MG tablet [id number] Yes Take 1 tablet (60 mg total) by mouth daily. Caro Harlene POUR, NP  Active   Zinc  Oxide 10 % MARCH [id number]  Apply twice daily to sacral decubitus ulcer  Patient not taking: Reported on 01/27/2024   Marylynn Verneita CROME, MD  Active Child            Home Care and Equipment/Supplies: Were Home Health Services Ordered?: Yes Name of Home Health Agency:: unknown Has Agency set up a time to come to your home?: No Any new equipment or medical supplies ordered?: NA  Functional Questionnaire: Do you need assistance with bathing/showering or dressing?: Yes Do you need assistance with meal preparation?: Yes Do you need assistance with eating?: No Do you have difficulty maintaining continence: No Do you need assistance with getting out of bed/getting out of a chair/moving?: No Do you have difficulty managing or taking your medications?: Yes  Follow up appointments reviewed: PCP Follow-up appointment confirmed?: Yes Date of PCP follow-up appointment?: 01/31/24 Follow-up Provider: [facility] Follow-up appointment confirmed?: NA Do you need transportation to your follow-up appointment?: No Do you understand care options if your condition(s) worsen?: Yes-patient verbalized understanding    SIGNATURE Julian Lemmings, LPN [facility] Nurse Health Advisor Direct Dial [phone]

## 2024-01-28 ENCOUNTER — Encounter: Payer: Self-pay | Admitting: Internal Medicine

## 2024-01-28 DIAGNOSIS — M6281 Muscle weakness (generalized): Secondary | ICD-10-CM | POA: Diagnosis not present

## 2024-01-28 DIAGNOSIS — R2689 Other abnormalities of gait and mobility: Secondary | ICD-10-CM | POA: Diagnosis not present

## 2024-01-28 DIAGNOSIS — Z741 Need for assistance with personal care: Secondary | ICD-10-CM | POA: Diagnosis not present

## 2024-01-28 DIAGNOSIS — K921 Melena: Secondary | ICD-10-CM | POA: Diagnosis not present

## 2024-01-28 DIAGNOSIS — D696 Thrombocytopenia, unspecified: Secondary | ICD-10-CM | POA: Diagnosis not present

## 2024-01-28 DIAGNOSIS — R2681 Unsteadiness on feet: Secondary | ICD-10-CM | POA: Diagnosis not present

## 2024-01-28 DIAGNOSIS — D62 Acute posthemorrhagic anemia: Secondary | ICD-10-CM | POA: Diagnosis not present

## 2024-01-28 DIAGNOSIS — R278 Other lack of coordination: Secondary | ICD-10-CM | POA: Diagnosis not present

## 2024-01-28 DIAGNOSIS — L89151 Pressure ulcer of sacral region, stage 1: Secondary | ICD-10-CM | POA: Diagnosis not present

## 2024-01-28 DIAGNOSIS — E43 Unspecified severe protein-calorie malnutrition: Secondary | ICD-10-CM | POA: Diagnosis not present

## 2024-01-28 DIAGNOSIS — N401 Enlarged prostate with lower urinary tract symptoms: Secondary | ICD-10-CM | POA: Diagnosis not present

## 2024-01-28 DIAGNOSIS — K7030 Alcoholic cirrhosis of liver without ascites: Secondary | ICD-10-CM | POA: Diagnosis not present

## 2024-01-28 DIAGNOSIS — K269 Duodenal ulcer, unspecified as acute or chronic, without hemorrhage or perforation: Secondary | ICD-10-CM | POA: Diagnosis not present

## 2024-01-28 DIAGNOSIS — R4189 Other symptoms and signs involving cognitive functions and awareness: Secondary | ICD-10-CM | POA: Diagnosis not present

## 2024-01-28 DIAGNOSIS — E039 Hypothyroidism, unspecified: Secondary | ICD-10-CM | POA: Diagnosis not present

## 2024-01-28 DIAGNOSIS — D539 Nutritional anemia, unspecified: Secondary | ICD-10-CM | POA: Diagnosis not present

## 2024-01-28 DIAGNOSIS — N138 Other obstructive and reflux uropathy: Secondary | ICD-10-CM | POA: Diagnosis not present

## 2024-01-30 NOTE — Telephone Encounter (Signed)
 Noted

## 2024-01-31 ENCOUNTER — Ambulatory Visit: Admitting: Nurse Practitioner

## 2024-02-04 ENCOUNTER — Encounter: Payer: Self-pay | Admitting: Internal Medicine

## 2024-02-04 ENCOUNTER — Ambulatory Visit: Payer: Self-pay

## 2024-02-04 DIAGNOSIS — D696 Thrombocytopenia, unspecified: Secondary | ICD-10-CM

## 2024-02-04 NOTE — Telephone Encounter (Signed)
 FYI Only or Action Required?: FYI only for provider. Call from pt's daughter - not on DPR. She states that pt has had a 5 lb weight gain overnight. Both his legs and abdomen are swollen. She states that pt is drinking. She states that pt has an altered mental status.  Patient was last seen in primary care on 12/13/2023 by Vincente Saber, NP.  Called Nurse Triage reporting Weight Gain and Leg Swelling.  Symptoms began Weight gain overnight.  Interventions attempted: Nothing.  Symptoms are: gradually worsening.  Triage Disposition: Go to ED Now (or PCP Triage)  Patient/caregiver understands and will follow disposition?: Yes                    Copied from CRM [id number]. Topic: Clinical - Red Word Triage >> Feb 04, 2024  8:50 AM Franky GRADE wrote: Red Word that prompted transfer to Nurse Triage: Patient's daughter Lauraine is calling because she has noticed that patient is retaining a lot of liquids, His weight went from 140.9 yesterday to 145 today.The encephalopathy are affecting his cognition. Reason for Disposition  Patient sounds very sick or weak to the triager  Answer Assessment - Initial Assessment Questions Call from pt's daughter - not on DPR. She states that pt has had a 5 lb weight gain overnight. Both his legs and abdomen are swollen. She states that pt is drinking. She states that pt has an altered mental status.      1. ONSET: When did the swelling start? (e.g., minutes, hours, days)     Ongoing - also abdominal swelling  2. LOCATION: What part of the leg is swollen?  Are both legs swollen or just one leg?     both 3. SEVERITY: How bad is the swelling? (e.g., localized; mild, moderate, severe)    Overnight weight gain 10. OTHER SYMPTOMS: Do you have any other symptoms? (e.g., chest pain, difficulty breathing)       Cognition changes, abdominal swelling  Protocols used: Leg Swelling and Edema-A-AH

## 2024-02-04 NOTE — Telephone Encounter (Signed)
 See triage note.

## 2024-02-04 NOTE — Telephone Encounter (Signed)
 Spoke with pt's daughter and she stated that they have a call out to his GI doctor as well to see if the ED is what they recommend as well. Lauraine stated that as soon as they hear back from them she will let us  know what they decided to do.

## 2024-02-04 NOTE — Telephone Encounter (Signed)
 Routing to clinical pool to check to make sure pt went to ED.

## 2024-02-07 DIAGNOSIS — K7460 Unspecified cirrhosis of liver: Secondary | ICD-10-CM | POA: Diagnosis not present

## 2024-02-11 ENCOUNTER — Ambulatory Visit: Admitting: Internal Medicine

## 2024-02-17 ENCOUNTER — Encounter: Payer: Self-pay | Admitting: Internal Medicine

## 2024-02-17 ENCOUNTER — Ambulatory Visit (INDEPENDENT_AMBULATORY_CARE_PROVIDER_SITE_OTHER): Admitting: Internal Medicine

## 2024-02-17 VITALS — BP 94/52 | HR 69 | Ht 72.0 in | Wt 145.0 lb

## 2024-02-17 DIAGNOSIS — R161 Splenomegaly, not elsewhere classified: Secondary | ICD-10-CM

## 2024-02-17 DIAGNOSIS — F10188 Alcohol abuse with other alcohol-induced disorder: Secondary | ICD-10-CM

## 2024-02-17 DIAGNOSIS — K7030 Alcoholic cirrhosis of liver without ascites: Secondary | ICD-10-CM | POA: Diagnosis not present

## 2024-02-17 DIAGNOSIS — D62 Acute posthemorrhagic anemia: Secondary | ICD-10-CM | POA: Diagnosis not present

## 2024-02-17 DIAGNOSIS — J189 Pneumonia, unspecified organism: Secondary | ICD-10-CM

## 2024-02-17 DIAGNOSIS — E43 Unspecified severe protein-calorie malnutrition: Secondary | ICD-10-CM | POA: Diagnosis not present

## 2024-02-17 DIAGNOSIS — K922 Gastrointestinal hemorrhage, unspecified: Secondary | ICD-10-CM

## 2024-02-17 MED ORDER — MIRTAZAPINE 15 MG PO TABS: 15.0000 mg | ORAL_TABLET | Freq: Every day | ORAL | 2 refills | Status: DC

## 2024-02-17 MED ORDER — SUCRALFATE 1 GM/10ML PO SUSP: 1.0000 g | Freq: Three times a day (TID) | ORAL | 0 refills | Status: DC

## 2024-02-17 NOTE — Progress Notes (Unsigned)
 Subjective:  Patient ID: Tony Luna, male    DOB: Feb 22, 1941  Age: 83 y.o. MRN: [id number]  CC: The primary encounter diagnosis was UGI bleed. 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   HPI Tony Luna presents for  Chief Complaint  Patient presents with   Medical Management of Chronic Issues    Recent  hospitalizations reviewed.  presented to [facility] end of July secondary to acute upper GI bleed, melena, found to have large duodenal ulcer with visible vessel, which was cauterized. Vascular surgery was consulted as a backup. However, bleeding stopped. Patient received multiple blood transfusion. He had mild ascites as well as swelling of legs, started on low-dose diuretics. He was discharged to acute rehab at [facility].  Discharged on August 27  [facility] for rehab after hospitalization for melena and required transfusions. 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 due to weakness stayed at facility another week.   Patient saw Gi Dr Unk on August 19 .  Patient reports having brown stool., Has been drinking boost supplements 2-3 times daily, not a big eater. He is using walker, working with physical therapy. His appetite is gradually improving. He has not restarted drinking alcohol for last 3 weeks. He reports swelling In the perianal area which has to push inside. Denies any rectal pain or rectal bleeding. Patient's sister is concerned about his cognitive impairment and last several weeks, that he is confused and forgetful    He is feeling better than he has in a long time  he has changed his diet ,  is working with a Systems analyst 3/week at [facility] . Staying [facility],  has home health aide   5 days per seek.  Daughter Devere sees ihm on the weekends.   Weight at home has been ztable [phone]    taking lasix  40 mg daily  to manage ascites. Still drinking alcohol on a daily basis.    Last dose of carafate  was yesterday,  has run out.   Confusion SLUMS score was 20/30 ,  some problems with spatial recognition, recall.   No problems with orientatio n     Review of Systems;  Patient denies headache, fevers, malaise, unintentional weight loss, skin rash, eye pain, sinus congestion and sinus pain, sore throat, dysphagia,  hemoptysis , cough, dyspnea, wheezing, chest pain, palpitations, orthopnea, edema, abdominal pain, nausea, melena, diarrhea, constipation, flank pain, dysuria, hematuria, urinary  Frequency, nocturia, numbness, tingling, seizures,  Focal weakness, Loss of consciousness,  Tremor, insomnia, depression, anxiety, and suicidal ideation.      Objective:  BP (!) 94/52   Pulse 69   Ht 6' (1.829 m)   Wt 145 lb (65.8 kg)   SpO2 96%   BMI 19.67 kg/m   BP Readings from Last 3 Encounters:  02/17/24 (!) 94/52  01/23/24 122/66  01/22/24 (!) 183/162    Wt Readings from Last 3 Encounters:  02/17/24 145 lb (65.8 kg)  01/23/24 160 lb 3.2 oz (72.7 kg)  01/22/24 160 lb (72.6 kg)    Physical Exam Vitals reviewed.  Constitutional:      General: He is not in acute distress.    Appearance: Normal appearance. He is underweight. He is  not ill-appearing, toxic-appearing or diaphoretic.  HENT:     Head: Normocephalic.  Eyes:     General: No scleral icterus.       Right eye: No discharge.        Left eye: No discharge.     Conjunctiva/sclera: Conjunctivae normal.  Cardiovascular:     Rate and Rhythm: Normal rate and regular rhythm.     Heart sounds: Normal heart sounds.  Pulmonary:     Effort: Pulmonary effort is normal. No respiratory distress.     Breath sounds: Normal breath sounds.  Musculoskeletal:         General: Normal range of motion.     Cervical back: Normal range of motion.  Skin:    General: Skin is warm and dry.  Neurological:     General: No focal deficit present.     Mental Status: He is alert and oriented to person, place, and time. Mental status is at baseline.  Psychiatric:        Mood and Affect: Mood normal.        Behavior: Behavior normal.        Thought Content: Thought content normal.        Judgment: Judgment normal.     Lab Results  Component Value Date   HGBA1C 4.4 (L) 09/23/2023   HGBA1C 4.7 03/22/2023   HGBA1C 4.9 07/02/2022    Lab Results  Component Value Date   CREATININE 0.7 01/06/2024   CREATININE 0.69 01/01/2024   CREATININE 0.59 (L) 12/31/2023    Lab Results  Component Value Date   WBC 5.8 01/22/2024   HGB 12.9 (L) 01/22/2024   HCT 38.5 (L) 01/22/2024   PLT 170 01/22/2024   GLUCOSE 118 (H) 01/01/2024   CHOL 202 (H) 09/23/2023   TRIG 61.0 09/23/2023   HDL 106.60 09/23/2023   LDLDIRECT 74.0 09/23/2023   LDLCALC 84 09/23/2023   ALT 90 (H) 01/01/2024   AST 134 (H) 01/01/2024   NA 140 01/06/2024   K 4.2 01/06/2024   CL 105 01/06/2024   CREATININE 0.7 01/06/2024   BUN 23 (A) 01/06/2024   CO2 29 (A) 01/06/2024   TSH 4.700 (H) 01/22/2024   PSA 1.65 07/02/2022   INR 1.2 01/22/2024   HGBA1C 4.4 (L) 09/23/2023    DG Chest Port 1 View Result Date: 12/26/2023 CLINICAL DATA:  Hypoxia. EXAM: PORTABLE CHEST 1 VIEW COMPARISON:  Chest radiograph dated 12/20/2023. FINDINGS: Shallow inspiration. Bilateral lower lung field opacities, new since the prior radiograph most consistent with pneumonia. No pleural effusion or pneumothorax. Biapical calcified plaques. Stable cardiac silhouette no acute osseous pathology. IMPRESSION: Bilateral lower lung field pneumonia. Electronically Signed   By: Vanetta Chou M.D.   On: 12/26/2023 16:21    Assessment & Plan:  .UGI bleed Assessment & Plan: Secondary to multiple gastric and duodenal ulcers found on EGD  during admission.  Anemia has resolved.   Will refill  sucralfate  and PPI, given conitnued use of alcohol   strongly advised to practice alcohol abstinence.    Alcoholic cirrhosis, unspecified whether ascites present (HCC) -     Sucralfate ; Take 10 mLs (1 g total) by mouth 4 (four) times daily -  with meals and at bedtime for 12 days.  Dispense: 473 mL; Refill: 0  Protein-calorie malnutrition, severe Assessment & Plan: Weight has stabilized per home surveillance readings and he is drinking 2 Ensures daily to boost his protein intake   Orders: -     Mirtazapine ; Take 1  tablet (15 mg total) by mouth at bedtime.  Dispense: 30 tablet; Refill: 2  Enlargement of spleen Assessment & Plan: Secondary to portal hypertension from cirrhosis   Community acquired bilateral lower lobe pneumonia -     DG Chest 2 View; Future  Alcoholic cirrhosis of liver without ascites (HCC) Assessment & Plan: Diagnosed in 2016, secondary to alcohol, with ongoing use . He continues to assert that he has hisalcohol use uncer control and uses a half glass of wine to help himsleep.  Alcohol abstinence strongly advised .   Lab Results  Component Value Date   ALT 90 (H) 01/01/2024   AST 134 (H) 01/01/2024   ALKPHOS 193 (H) 01/01/2024   BILITOT 5.2 (H) 01/01/2024   , secondar   Alcohol abuse with alcohol-induced mental disorder [facility]) Assessment & Plan: His daughter has noted persistent cognitive changes in the last several months related to short term memory    ABLA (acute blood loss anemia) Assessment & Plan: Resolved  by repeat CBC     I personally spent a total of 35 minutes in the care of the patient today including preparing to see the patient, getting/reviewing separately obtained history, performing a medically appropriate exam/evaluation, counseling and educating, placing orders, referring and communicating with other health care professionals, documenting clinical information in the EHR,  communicating results, and coordinating care.   Follow-up: Return in about 3 months (around 05/18/2024).   Verneita LITTIE Kettering, MD

## 2024-02-18 ENCOUNTER — Encounter: Payer: Self-pay | Admitting: Internal Medicine

## 2024-02-18 NOTE — Assessment & Plan Note (Signed)
 Diagnosed in 2016, secondary to alcohol, with ongoing use . He continues to assert that he has hisalcohol use uncer control and uses a half glass of wine to help himsleep.  Alcohol abstinence strongly advised .   Lab Results  Component Value Date   ALT 90 (H) 01/01/2024   AST 134 (H) 01/01/2024   ALKPHOS 193 (H) 01/01/2024   BILITOT 5.2 (H) 01/01/2024   , secondar

## 2024-02-18 NOTE — Assessment & Plan Note (Signed)
 Secondary to portal hypertension from cirrhosis

## 2024-02-18 NOTE — Assessment & Plan Note (Signed)
 Weight has stabilized per home surveillance readings and he is drinking 2 Ensures daily to boost his protein intake

## 2024-02-18 NOTE — Assessment & Plan Note (Signed)
Resolved by repeat CBC

## 2024-02-18 NOTE — Assessment & Plan Note (Signed)
 His daughter has noted persistent cognitive changes in the last several months related to short term memory

## 2024-02-18 NOTE — Assessment & Plan Note (Signed)
 Secondary to multiple gastric and duodenal ulcers found on EGD during admission.  Anemia has resolved.   Will refill  sucralfate  and PPI, given conitnued use of alcohol   strongly advised to practice alcohol abstinence.

## 2024-02-23 DIAGNOSIS — J189 Pneumonia, unspecified organism: Secondary | ICD-10-CM | POA: Diagnosis not present

## 2024-02-23 DIAGNOSIS — J9601 Acute respiratory failure with hypoxia: Secondary | ICD-10-CM | POA: Diagnosis not present

## 2024-02-25 ENCOUNTER — Other Ambulatory Visit: Payer: Self-pay | Admitting: Nurse Practitioner

## 2024-02-25 DIAGNOSIS — R000 Tachycardia, unspecified: Secondary | ICD-10-CM

## 2024-02-26 DIAGNOSIS — K3189 Other diseases of stomach and duodenum: Secondary | ICD-10-CM

## 2024-02-26 HISTORY — DX: Other diseases of stomach and duodenum: K31.89

## 2024-02-27 DIAGNOSIS — R2689 Other abnormalities of gait and mobility: Secondary | ICD-10-CM | POA: Diagnosis not present

## 2024-02-27 DIAGNOSIS — R4189 Other symptoms and signs involving cognitive functions and awareness: Secondary | ICD-10-CM | POA: Diagnosis not present

## 2024-02-27 DIAGNOSIS — R2681 Unsteadiness on feet: Secondary | ICD-10-CM | POA: Diagnosis not present

## 2024-02-27 DIAGNOSIS — E039 Hypothyroidism, unspecified: Secondary | ICD-10-CM | POA: Diagnosis not present

## 2024-02-27 DIAGNOSIS — D62 Acute posthemorrhagic anemia: Secondary | ICD-10-CM | POA: Diagnosis not present

## 2024-02-27 DIAGNOSIS — K921 Melena: Secondary | ICD-10-CM | POA: Diagnosis not present

## 2024-02-27 DIAGNOSIS — Z741 Need for assistance with personal care: Secondary | ICD-10-CM | POA: Diagnosis not present

## 2024-02-27 DIAGNOSIS — N401 Enlarged prostate with lower urinary tract symptoms: Secondary | ICD-10-CM | POA: Diagnosis not present

## 2024-02-27 DIAGNOSIS — K269 Duodenal ulcer, unspecified as acute or chronic, without hemorrhage or perforation: Secondary | ICD-10-CM | POA: Diagnosis not present

## 2024-02-27 DIAGNOSIS — L89151 Pressure ulcer of sacral region, stage 1: Secondary | ICD-10-CM | POA: Diagnosis not present

## 2024-02-27 DIAGNOSIS — K7030 Alcoholic cirrhosis of liver without ascites: Secondary | ICD-10-CM | POA: Diagnosis not present

## 2024-02-27 DIAGNOSIS — N138 Other obstructive and reflux uropathy: Secondary | ICD-10-CM | POA: Diagnosis not present

## 2024-02-27 DIAGNOSIS — R278 Other lack of coordination: Secondary | ICD-10-CM | POA: Diagnosis not present

## 2024-02-27 DIAGNOSIS — M6281 Muscle weakness (generalized): Secondary | ICD-10-CM | POA: Diagnosis not present

## 2024-02-27 DIAGNOSIS — D696 Thrombocytopenia, unspecified: Secondary | ICD-10-CM | POA: Diagnosis not present

## 2024-02-27 DIAGNOSIS — E43 Unspecified severe protein-calorie malnutrition: Secondary | ICD-10-CM | POA: Diagnosis not present

## 2024-03-05 NOTE — Telephone Encounter (Signed)
 Pt is scheduled for 11 am tomorrow.

## 2024-03-05 NOTE — Telephone Encounter (Unsigned)
 Copied from CRM #[id number]. Topic: General - Other >> Mar 05, 2024 10:31 AM Thersia BROCKS wrote: Reason for CRM: Patient called in regarding receiving a letter , stating he needs to get a xray for chest wanted to know if that is okay to just come tomorrow morning would like a call

## 2024-03-06 ENCOUNTER — Ambulatory Visit

## 2024-03-06 ENCOUNTER — Other Ambulatory Visit

## 2024-03-06 DIAGNOSIS — K7460 Unspecified cirrhosis of liver: Secondary | ICD-10-CM | POA: Diagnosis not present

## 2024-03-06 DIAGNOSIS — J189 Pneumonia, unspecified organism: Secondary | ICD-10-CM | POA: Diagnosis not present

## 2024-03-09 ENCOUNTER — Ambulatory Visit: Payer: Self-pay | Admitting: Internal Medicine

## 2024-03-12 DIAGNOSIS — K7030 Alcoholic cirrhosis of liver without ascites: Secondary | ICD-10-CM | POA: Diagnosis not present

## 2024-03-12 DIAGNOSIS — R490 Dysphonia: Secondary | ICD-10-CM

## 2024-03-12 HISTORY — DX: Dysphonia: R49.0

## 2024-03-19 ENCOUNTER — Other Ambulatory Visit: Payer: Self-pay | Admitting: Nurse Practitioner

## 2024-03-19 DIAGNOSIS — R000 Tachycardia, unspecified: Secondary | ICD-10-CM

## 2024-03-20 ENCOUNTER — Other Ambulatory Visit: Payer: Self-pay | Admitting: Nurse Practitioner

## 2024-03-20 DIAGNOSIS — R000 Tachycardia, unspecified: Secondary | ICD-10-CM

## 2024-03-21 ENCOUNTER — Other Ambulatory Visit: Payer: Self-pay | Admitting: Internal Medicine

## 2024-03-21 DIAGNOSIS — R000 Tachycardia, unspecified: Secondary | ICD-10-CM

## 2024-03-24 DIAGNOSIS — J189 Pneumonia, unspecified organism: Secondary | ICD-10-CM | POA: Diagnosis not present

## 2024-03-24 DIAGNOSIS — J9601 Acute respiratory failure with hypoxia: Secondary | ICD-10-CM | POA: Diagnosis not present

## 2024-03-30 ENCOUNTER — Encounter: Payer: Self-pay | Admitting: Internal Medicine

## 2024-03-30 ENCOUNTER — Telehealth: Payer: Self-pay

## 2024-03-30 ENCOUNTER — Other Ambulatory Visit (HOSPITAL_COMMUNITY): Payer: Self-pay

## 2024-03-30 DIAGNOSIS — R2689 Other abnormalities of gait and mobility: Secondary | ICD-10-CM

## 2024-03-30 NOTE — Telephone Encounter (Signed)
 Prolia VOB initiated via [email or web address]  Next Prolia inj DUE: 06/19/23

## 2024-03-30 NOTE — Telephone Encounter (Signed)
 Pt ready for scheduling for PROLIA  on or after : 04/28/24  Option# 1: Buy/Bill (Office supplied medication)  Out-of-pocket cost due at time of clinic visit: $332  Number of injection/visits approved: ---  Primary: [employer] Prolia  co-insurance: 20% Admin fee co-insurance: 0%  Secondary: --- Prolia  co-insurance:  Admin fee co-insurance:   Medical Benefit Details: Date Benefits were checked: 03/30/24 Deductible: NO/ Coinsurance: 20%/ Admin Fee: 0%  Prior Auth: N/A PA# Expiration Date:   # of doses approved: ----------------------------------------------------------------------- Option# 2- Med Obtained from pharmacy: Prolia  is no longer preferred for pharmacy benefit. Jubbonti is now preferred. PRICING IS FOR JUBBONTI  Pharmacy benefit: Copay $0 (Paid to pharmacy) Admin Fee: 0% (Pay at clinic)  Prior Auth: N/A PA# Expiration Date:   # of doses approved:   If patient wants fill through the pharmacy benefit please send prescription to: [facility], and include estimated need by date in rx notes. Pharmacy will ship medication directly to the office.  Patient NOT eligible for Prolia  Copay Card. Copay Card can make patient's cost as little as $25. Link to apply: [email or web address]://www.amgensupportplus.[email or web address]/copay  ** This summary of benefits is an estimation of the patient's out-of-pocket cost. Exact cost may very based on individual plan coverage.

## 2024-04-06 ENCOUNTER — Telehealth: Payer: Self-pay | Admitting: Internal Medicine

## 2024-04-06 ENCOUNTER — Other Ambulatory Visit: Payer: Self-pay | Admitting: Internal Medicine

## 2024-04-06 DIAGNOSIS — K7030 Alcoholic cirrhosis of liver without ascites: Secondary | ICD-10-CM

## 2024-04-06 NOTE — Telephone Encounter (Signed)
 Copied from CRM [id number]. Topic: Clinical - Medication Question >> Apr 06, 2024 10:24 AM Tony Luna wrote: Reason for CRM:  He would like to see if he can get Lactulose ordered for him. Please call him to discuss medication at [phone].

## 2024-04-07 NOTE — Telephone Encounter (Signed)
 Spoke with pt and he was actually talking about the carafate . Pt is aware that medication has been sent in.

## 2024-04-08 DIAGNOSIS — L538 Other specified erythematous conditions: Secondary | ICD-10-CM | POA: Diagnosis not present

## 2024-04-08 DIAGNOSIS — H2511 Age-related nuclear cataract, right eye: Secondary | ICD-10-CM | POA: Diagnosis not present

## 2024-04-08 DIAGNOSIS — H4323 Crystalline deposits in vitreous body, bilateral: Secondary | ICD-10-CM | POA: Diagnosis not present

## 2024-04-08 DIAGNOSIS — B078 Other viral warts: Secondary | ICD-10-CM | POA: Diagnosis not present

## 2024-04-08 DIAGNOSIS — H35372 Puckering of macula, left eye: Secondary | ICD-10-CM | POA: Diagnosis not present

## 2024-04-08 DIAGNOSIS — C4442 Squamous cell carcinoma of skin of scalp and neck: Secondary | ICD-10-CM | POA: Diagnosis not present

## 2024-04-08 DIAGNOSIS — R238 Other skin changes: Secondary | ICD-10-CM | POA: Diagnosis not present

## 2024-04-08 DIAGNOSIS — Z961 Presence of intraocular lens: Secondary | ICD-10-CM | POA: Diagnosis not present

## 2024-04-15 ENCOUNTER — Other Ambulatory Visit: Payer: Self-pay | Admitting: Internal Medicine

## 2024-04-15 ENCOUNTER — Other Ambulatory Visit: Payer: Self-pay | Admitting: Nurse Practitioner

## 2024-04-15 DIAGNOSIS — E43 Unspecified severe protein-calorie malnutrition: Secondary | ICD-10-CM

## 2024-04-15 DIAGNOSIS — K7030 Alcoholic cirrhosis of liver without ascites: Secondary | ICD-10-CM

## 2024-04-16 DIAGNOSIS — H2513 Age-related nuclear cataract, bilateral: Secondary | ICD-10-CM | POA: Diagnosis not present

## 2024-04-16 DIAGNOSIS — H2511 Age-related nuclear cataract, right eye: Secondary | ICD-10-CM | POA: Diagnosis not present

## 2024-04-22 ENCOUNTER — Encounter: Payer: Self-pay | Admitting: Ophthalmology

## 2024-04-22 ENCOUNTER — Ambulatory Visit: Admitting: Urology

## 2024-04-22 NOTE — Anesthesia Preprocedure Evaluation (Addendum)
 Anesthesia Evaluation  Patient identified by MRN, date of birth, ID band Patient awake    Reviewed: Allergy & Precautions, H&P , NPO status , Patient's Chart, lab work & pertinent test results  History of Anesthesia Complications (+) history of anesthetic complications  Airway Mallampati: II  TM Distance: >3 FB Neck ROM: Full    Dental no notable dental hx. (+) Poor Dentition   Pulmonary neg pulmonary ROS, sleep apnea , pneumonia, former smoker   Pulmonary exam normal breath sounds clear to auscultation       Cardiovascular hypertension, + CAD  negative cardio ROS Normal cardiovascular exam+ dysrhythmias + Valvular Problems/Murmurs  Rhythm:Regular Rate:Normal  10-09-22 echo 1. Left ventricular ejection fraction, by estimation, is 55 to 60%. The  left ventricle has normal function. The left ventricle has no regional  wall motion abnormalities. Left ventricular diastolic parameters are  consistent with Grade I diastolic  dysfunction (impaired relaxation). The average left ventricular global  longitudinal strain is -16.8 %.   2. Right ventricular systolic function is normal. The right ventricular  size is normal. There is normal pulmonary artery systolic pressure. The  estimated right ventricular systolic pressure is 31.4 mmHg.   3. Left atrial size was mildly dilated.   4. The mitral valve is normal in structure. No evidence of mitral valve  regurgitation. No evidence of mitral stenosis.   5. The aortic valve is normal in structure. There is moderate  calcification of the aortic valve. Aortic valve regurgitation is mild.  Mild aortic valve stenosis. Aortic valve mean gradient measures 9.5 mmHg.   6. The inferior vena cava is normal in size with greater than 50%  respiratory variability, suggesting right atrial pressure of 3 mmHg.     Neuro/Psych  PSYCHIATRIC DISORDERS  Depression    03-12-24 office note: Patient's sister is  concerned about his cognitive impairment and last several weeks, that he is confused and forgetful negative neurological ROS  negative psych ROS   GI/Hepatic negative GI ROS, Neg liver ROS, PUD,,,03-12-24 Alcoholic cirrhosis of liver:MELD sodium 16, child class B Secondary liver disease workup was negative in the past Elevated serum ferritin as well as percent sat, hereditary hemochromatosis panel was negative in the past Portal hypertension: Manifested by portal hypertensive gastropathy, thrombocytopenia, hyperbilirubinemia. There were no varices based on EGD on 12/26/2023 Volume status: euvolemic Advised to continue Lasix  to 40 mg daily and spironolactone  100 mg daily Discussed about strict low-sodium diet HCC screening: Serum AFP levels were normal in the past, no liver lesions based on recent imaging Recommend ultrasound liver in 11/2024 HRS none PSE: Continue rifaximin  550mg  twice daily, long-term  He has a history of alcohol use but has significantly reduced his intake, which has helped with his nutrition. He occasionally drinks a bottle of white wine, attributing some of this to sharing with neighbors and friends.     Endo/Other  negative endocrine ROSHypothyroidism    Renal/GU negative Renal ROS  negative genitourinary   Musculoskeletal negative musculoskeletal ROS (+) Arthritis ,    Abdominal   Peds negative pediatric ROS (+)  Hematology negative hematology ROS (+) Blood dyscrasia, anemia   Anesthesia Other Findings Previous cataract 02-11-21 left eye  Past Medical History:  Diagnosis Date   Arthritis years ago  mostly minor   Mallory-Weiss tear   Osteoporosis, post-menopausal test in 2018  believe due to wear on femur head prior to knee replacement   Sleep apnea 2-3 years ago  alleviated with dental device   Thyroid   disease 4-5 years ago  taking NP thyroid . no further indications   Past Surgical History:  Procedure Laterality Date   JOINT REPLACEMENT  Jan. 2016  normal function now   Bilateral blepharoplasties   FUNCTIONAL ENDOSCOPIC SINUS SURGERY   HERNIA REPAIR 10-15 years ago  no problems since   KNEE ARTHROSCOPY 60 years ago  knee replaced 2016   Left total knee replacement   Lumbar spine surgery   TONSILLECTOMY age 55  Arthritis Allergy Esophageal tear Rotator cuff tear Complication of anesthesia Dysrhythmia Hypothyroidism  Pneumonia Primary osteoarthritis of left knee Cirrhosis (HCC) Sleep apnea  Bleeding duodenal ulcer Cirrhosis with alcoholism (HCC) Cancer (HCC) Depression  Substance abuse (HCC) Alcoholic cirrhosis of liver without ascites Alcohol abuse Raspy voice  Portal hypertensive gastropathy mild,  Per recent EGD, no esophageal varices Mallory-Weiss tear  Former smoker quit 1975 Grade I diastolic dysfunction  Mild aortic stenosis by prior echocardiogram Mild aortic valve regurgitation      Reproductive/Obstetrics negative OB ROS                              Anesthesia Physical Anesthesia Plan  ASA: 3  Anesthesia Plan: MAC   Post-op Pain Management:    Induction: Intravenous  PONV Risk Score and Plan:   Airway Management Planned: Natural Airway and Nasal Cannula  Additional Equipment:   Intra-op Plan:   Post-operative Plan:   Informed Consent: I have reviewed the patients History and Physical, chart, labs and discussed the procedure including the risks, benefits and alternatives for the proposed anesthesia with the patient or authorized representative who has indicated his/her understanding and acceptance.     Dental Advisory Given  Plan Discussed with: Anesthesiologist, CRNA and Surgeon  Anesthesia Plan Comments: (Patient consented for risks of anesthesia including but not limited to:  - adverse reactions to medications - damage to eyes, teeth, lips or other oral mucosa - nerve damage due to positioning  - sore throat or hoarseness - Damage to  heart, brain, nerves, lungs, other parts of body or loss of life  Patient voiced understanding and assent.)         Anesthesia Quick Evaluation

## 2024-04-24 ENCOUNTER — Ambulatory Visit: Payer: Self-pay | Admitting: Urology

## 2024-04-24 DIAGNOSIS — J189 Pneumonia, unspecified organism: Secondary | ICD-10-CM | POA: Diagnosis not present

## 2024-04-24 DIAGNOSIS — J9601 Acute respiratory failure with hypoxia: Secondary | ICD-10-CM | POA: Diagnosis not present

## 2024-04-27 NOTE — Discharge Instructions (Signed)

## 2024-04-28 ENCOUNTER — Ambulatory Visit
Admission: RE | Admit: 2024-04-28 | Discharge: 2024-04-28 | Disposition: A | Discharge status: Home / Self Care | Attending: Ophthalmology | Admitting: Ophthalmology

## 2024-04-28 ENCOUNTER — Telehealth: Payer: Self-pay | Admitting: Oncology

## 2024-04-28 ENCOUNTER — Encounter: Payer: Self-pay | Admitting: Oncology

## 2024-04-28 ENCOUNTER — Encounter: Payer: Self-pay | Admitting: Ophthalmology

## 2024-04-28 ENCOUNTER — Ambulatory Visit: Payer: Self-pay | Admitting: Anesthesiology

## 2024-04-28 ENCOUNTER — Other Ambulatory Visit: Payer: Self-pay

## 2024-04-28 ENCOUNTER — Encounter
Admission: RE | Disposition: A | Discharge status: Home / Self Care | Payer: Self-pay | Source: Home / Self Care | Attending: Ophthalmology

## 2024-04-28 DIAGNOSIS — Z87891 Personal history of nicotine dependence: Secondary | ICD-10-CM | POA: Diagnosis not present

## 2024-04-28 DIAGNOSIS — Z79899 Other long term (current) drug therapy: Secondary | ICD-10-CM | POA: Diagnosis not present

## 2024-04-28 DIAGNOSIS — E039 Hypothyroidism, unspecified: Secondary | ICD-10-CM | POA: Diagnosis not present

## 2024-04-28 DIAGNOSIS — I10 Essential (primary) hypertension: Secondary | ICD-10-CM | POA: Diagnosis not present

## 2024-04-28 DIAGNOSIS — M1990 Unspecified osteoarthritis, unspecified site: Secondary | ICD-10-CM | POA: Diagnosis not present

## 2024-04-28 DIAGNOSIS — Z79890 Hormone replacement therapy (postmenopausal): Secondary | ICD-10-CM | POA: Diagnosis not present

## 2024-04-28 DIAGNOSIS — G4730 Sleep apnea, unspecified: Secondary | ICD-10-CM | POA: Diagnosis not present

## 2024-04-28 DIAGNOSIS — I2510 Atherosclerotic heart disease of native coronary artery without angina pectoris: Secondary | ICD-10-CM | POA: Diagnosis not present

## 2024-04-28 DIAGNOSIS — H2511 Age-related nuclear cataract, right eye: Secondary | ICD-10-CM | POA: Diagnosis not present

## 2024-04-28 HISTORY — DX: Malignant (primary) neoplasm, unspecified: C80.1

## 2024-04-28 HISTORY — DX: Chronic or unspecified duodenal ulcer with hemorrhage: K26.4

## 2024-04-28 HISTORY — PX: CATARACT EXTRACTION W/PHACO: SHX586

## 2024-04-28 HISTORY — DX: Alcoholic cirrhosis of liver without ascites: K70.30

## 2024-04-28 HISTORY — DX: Alcohol abuse, uncomplicated: F10.10

## 2024-04-28 HISTORY — DX: Gastro-esophageal laceration-hemorrhage syndrome: K22.6

## 2024-04-28 HISTORY — DX: Other psychoactive substance abuse, uncomplicated: F19.10

## 2024-04-28 HISTORY — DX: Depression, unspecified: F32.A

## 2024-04-28 SURGERY — PHACOEMULSIFICATION, CATARACT, WITH IOL INSERTION
Anesthesia: Monitor Anesthesia Care | Site: Eye | Laterality: Right

## 2024-04-28 MED ORDER — TETRACAINE HCL 0.5 % OP SOLN
1.0000 [drp] | OPHTHALMIC | Status: DC | PRN
  Administered 2024-04-28 (×3): 1 [drp] via OPHTHALMIC

## 2024-04-28 MED ORDER — BRIMONIDINE TARTRATE-TIMOLOL 0.2-0.5 % OP SOLN
OPHTHALMIC | Status: DC | PRN
  Administered 2024-04-28: 1 [drp] via OPHTHALMIC

## 2024-04-28 MED ORDER — MOXIFLOXACIN HCL 0.5 % OP SOLN
OPHTHALMIC | Status: DC | PRN
  Administered 2024-04-28: .2 mL via OPHTHALMIC

## 2024-04-28 MED ORDER — MIDAZOLAM HCL 2 MG/2ML IJ SOLN
INTRAMUSCULAR | Status: AC
  Filled 2024-04-28: qty 2

## 2024-04-28 MED ORDER — FENTANYL CITRATE (PF) 100 MCG/2ML IJ SOLN
INTRAMUSCULAR | Status: AC
  Filled 2024-04-28: qty 2

## 2024-04-28 MED ORDER — LIDOCAINE HCL (PF) 2 % IJ SOLN
INTRAOCULAR | Status: DC | PRN
  Administered 2024-04-28: 2 mL

## 2024-04-28 MED ORDER — SIGHTPATH DOSE#1 BSS IO SOLN
INTRAOCULAR | Status: DC | PRN
  Administered 2024-04-28: 44 mL via OPHTHALMIC

## 2024-04-28 MED ORDER — SIGHTPATH DOSE#1 NA CHONDROIT SULF-NA HYALURON 40-17 MG/ML IO SOLN
INTRAOCULAR | Status: DC | PRN
  Administered 2024-04-28: 1 mL via INTRAOCULAR

## 2024-04-28 MED ORDER — MIDAZOLAM HCL (PF) 2 MG/2ML IJ SOLN
INTRAMUSCULAR | Status: DC | PRN
  Administered 2024-04-28: .5 mg via INTRAVENOUS

## 2024-04-28 MED ORDER — SIGHTPATH DOSE#1 BSS IO SOLN
INTRAOCULAR | Status: DC | PRN
  Administered 2024-04-28: 15 mL via INTRAOCULAR

## 2024-04-28 MED ORDER — PHENYLEPHRINE HCL 10 % OP SOLN
1.0000 [drp] | OPHTHALMIC | Status: AC
  Administered 2024-04-28 (×3): 1 [drp] via OPHTHALMIC

## 2024-04-28 MED ORDER — FENTANYL CITRATE (PF) 100 MCG/2ML IJ SOLN
INTRAMUSCULAR | Status: DC | PRN
  Administered 2024-04-28: 50 ug via INTRAVENOUS

## 2024-04-28 MED ORDER — CYCLOPENTOLATE HCL 2 % OP SOLN
1.0000 [drp] | OPHTHALMIC | Status: AC
  Administered 2024-04-28 (×2): 1 [drp] via OPHTHALMIC

## 2024-04-28 MED ORDER — LACTATED RINGERS IV SOLN: INTRAVENOUS | Status: DC

## 2024-04-28 SURGICAL SUPPLY — 9 items
CANNULA ANT/CHMB 27G (MISCELLANEOUS) ×1 IMPLANT
CYSTOTOME ANGL RVRS SHRT 25G (CUTTER) ×1 IMPLANT
FEE CATARACT SUITE SIGHTPATH (MISCELLANEOUS) ×1 IMPLANT
GLOVE BIOGEL PI IND STRL 8 (GLOVE) ×1 IMPLANT
GLOVE SURG LX STRL 8.0 MICRO (GLOVE) ×1 IMPLANT
GLOVE SURG SYN 6.5 PF PI BL (GLOVE) ×1 IMPLANT
LENS IOL TECNIS EYHANCE 21.0 (Intraocular Lens) IMPLANT
NDL FILTER BLUNT 18X1 1/2 (NEEDLE) ×1 IMPLANT
SYR 3ML LL SCALE MARK (SYRINGE) ×1 IMPLANT

## 2024-04-28 NOTE — Op Note (Signed)
 PREOPERATIVE DIAGNOSIS:  Nuclear sclerotic cataract of the right eye.   POSTOPERATIVE DIAGNOSIS:  Right Eye Cataract   OPERATIVE PROCEDURE:ORPROCALL@   SURGEON:  Elsie Carmine, MD.   ANESTHESIA:  Anesthesiologist: Ola Donny BROCKS, MD CRNA: Jahoo, Sonia, CRNA  1.      Managed anesthesia care. 2.      0.50ml of Shugarcaine was instilled in the eye following the paracentesis.   COMPLICATIONS:  None.   TECHNIQUE:   Stop and chop   DESCRIPTION OF PROCEDURE:  The patient was examined and consented in the preoperative holding area where the aforementioned topical anesthesia was applied to the right eye and then brought back to the Operating Room where the right eye was prepped and draped in the usual sterile ophthalmic fashion and a lid speculum was placed. A paracentesis was created with the side port blade and the anterior chamber was filled with viscoelastic. A near clear corneal incision was performed with the steel keratome. A continuous curvilinear capsulorrhexis was performed with a cystotome followed by the capsulorrhexis forceps. Hydrodissection and hydrodelineation were carried out with BSS on a blunt cannula. The lens was removed in a stop and chop  technique and the remaining cortical material was removed with the irrigation-aspiration handpiece. The capsular bag was inflated with viscoelastic and the intraocular lens was placed in the capsular bag without complication. The remaining viscoelastic was removed from the eye with the irrigation-aspiration handpiece. The wounds were hydrated. The anterior chamber was flushed with BSS and the eye was inflated to physiologic pressure. 0.73ml of Vigamox  was placed in the anterior chamber. The wounds were found to be water  tight. The eye was dressed with Combigan . The patient was given protective glasses to wear throughout the day and a shield with which to sleep tonight. The patient was also given drops with which to begin a drop regimen today and  will follow-up with me in one day. Implant Name Type Inv. Item Serial No. Manufacturer Lot No. LRB No. Used Action  LENS IOL TECNIS EYHANCE 21.0 - [id number] Intraocular Lens LENS IOL TECNIS EYHANCE 21.0 [id number] [employer]  Right 1 Implanted   Procedure(s): PHACOEMULSIFICATION, CATARACT, WITH IOL INSERTION 6.27 00:39.1 (Right)  Electronically signed: Elsie Carmine 04/28/2024 11:34 AM

## 2024-04-28 NOTE — Transfer of Care (Signed)
 Immediate Anesthesia Transfer of Care Note  Patient: Tony Luna  Procedure(s) Performed: PHACOEMULSIFICATION, CATARACT, WITH IOL INSERTION (Right: Eye)  Patient Location: PACU  Anesthesia Type: MAC  Level of Consciousness: awake, alert  and patient cooperative  Airway and Oxygen  Therapy: Patient Spontanous Breathing and Patient connected to supplemental oxygen   Post-op Assessment: Post-op Vital signs reviewed, Patient's Cardiovascular Status Stable, Respiratory Function Stable, Patent Airway and No signs of Nausea or vomiting  Post-op Vital Signs: Reviewed and stable  Complications: No notable events documented.

## 2024-04-28 NOTE — Anesthesia Postprocedure Evaluation (Signed)
 Anesthesia Post Note  Patient: Tony Luna  Procedure(s) Performed: PHACOEMULSIFICATION, CATARACT, WITH IOL INSERTION 6.27 00:39.1 (Right: Eye)  Patient location during evaluation: PACU Anesthesia Type: MAC Level of consciousness: awake and alert Pain management: pain level controlled Vital Signs Assessment: post-procedure vital signs reviewed and stable Respiratory status: spontaneous breathing, nonlabored ventilation, respiratory function stable and patient connected to nasal cannula oxygen  Cardiovascular status: stable and blood pressure returned to baseline Postop Assessment: no apparent nausea or vomiting Anesthetic complications: no   No notable events documented.   Last Vitals:  Vitals:   04/28/24 1136 04/28/24 1142  BP: 110/66 104/65  Pulse: 81 79  Resp: 13 15  Temp: 36.6 C   SpO2: 95% 96%    Last Pain:  Vitals:   04/28/24 1142  TempSrc:   PainSc: 0-No pain                 Maevis Mumby C Tino Ronan

## 2024-04-28 NOTE — H&P (Signed)
 [facility]   Primary Care Physician:  Marylynn Verneita CROME, MD Ophthalmologist: Dr. Elsie Carmine  Pre-Procedure History & Physical: HPI:  Tony Luna is a 83 y.o. male here for cataract surgery.   Past Medical History:  Diagnosis Date   Alcohol abuse    Alcoholic cirrhosis of liver without ascites (HCC)    Allergy    Arthritis    Bleeding duodenal ulcer    august 2025   Cancer [employer])    skin cancers   Cirrhosis (HCC)    Cirrhosis with alcoholism (HCC)    Complication of anesthesia    :need catheter   Depression    Dysrhythmia    ? something told by reg dr  unable to say what it was   Esophageal tear    Former smoker 07/03/1973   quit 1975   Grade I diastolic dysfunction 10/09/2022   Hypothyroidism    Mallory-Weiss tear    Mild aortic stenosis by prior echocardiogram 10/09/2022   Mild aortic valve regurgitation 10/09/2022   Pneumonia    2 months ago   Portal hypertensive gastropathy (HCC) 02/2024   moderate, no varices noted   Primary osteoarthritis of left knee 06/22/2014   Raspy voice 03/12/2024   office note   Rotator cuff tear    Sleep apnea corrected- dental device   Substance abuse (HCC)    alcohol abuse    Past Surgical History:  Procedure Laterality Date   BACK SURGERY     blepharaplasty     CATARACT EXTRACTION W/PHACO Left 02/21/2021   Procedure: CATARACT EXTRACTION PHACO AND INTRAOCULAR LENS PLACEMENT (IOC) LEFT 4.56 00:34.9;  Surgeon: Carmine Elsie, MD;  Location: [facility];  Service: Ophthalmology;  Laterality: Left;   CAUTERY OF TURBINATES     COLONOSCOPY WITH PROPOFOL  N/A 07/19/2015   Procedure: COLONOSCOPY WITH PROPOFOL ;  Surgeon: Rogelia Copping, MD;  Location: [facility] ENDOSCOPY;  Service: Endoscopy;  Laterality: N/A;   COLONOSCOPY WITH PROPOFOL  N/A 03/21/2021   Procedure: COLONOSCOPY WITH PROPOFOL ;  Surgeon: Copping Rogelia, MD;  Location: [facility] ENDOSCOPY;  Service: Endoscopy;  Laterality: N/A;   COLONOSCOPY WITH PROPOFOL  N/A  04/18/2021   Procedure: COLONOSCOPY WITH PROPOFOL ;  Surgeon: Copping Rogelia, MD;  Location: [facility] ENDOSCOPY;  Service: Endoscopy;  Laterality: N/A;   COLONOSCOPY WITH PROPOFOL  N/A 04/24/2022   Procedure: COLONOSCOPY WITH PROPOFOL ;  Surgeon: Copping Rogelia, MD;  Location: [facility] ENDOSCOPY;  Service: Endoscopy;  Laterality: N/A;   ESOPHAGOGASTRODUODENOSCOPY N/A 12/26/2023   Procedure: EGD (ESOPHAGOGASTRODUODENOSCOPY);  Surgeon: Copping Rogelia, MD;  Location: [facility] ENDOSCOPY;  Service: Endoscopy;  Laterality: N/A;   EYE SURGERY     HERNIA REPAIR Left 20+ years ago   inguinal herniorrhapy   HOT HEMOSTASIS  12/26/2023   Procedure: EGD, WITH ARGON PLASMA COAGULATION;  Surgeon: Copping Rogelia, MD;  Location: [facility] ENDOSCOPY;  Service: Endoscopy;;   JOINT REPLACEMENT  left knee 2016   KNEE SURGERY     ? loose body/ chondral defect 22yrs   ROTATOR CUFF REPAIR     left   SPINE SURGERY  10-12 years ago   SUBMUCOSAL INJECTION  12/26/2023   Procedure: INJECTION, SUBMUCOSAL;  Surgeon: Copping Rogelia, MD;  Location: [facility] ENDOSCOPY;  Service: Endoscopy;;   TONSILLECTOMY     TOTAL KNEE ARTHROPLASTY Left 06/22/2014   Procedure: TOTAL KNEE ARTHROPLASTY;  Surgeon: Fonda SHAUNNA Olmsted, MD;  [facility]: [facility] OR;  Service: Orthopedics;  Laterality: Left;    Prior to Admission medications   Medication Sig Start Date End Date Taking? Authorizing  Provider  folic acid  (FOLVITE ) 1 MG tablet Take 1 mg by mouth. 01/14/24 01/13/25 Yes [provider]  furosemide  (LASIX ) 20 MG tablet Take 2 tablets (40 mg total) by mouth daily. 01/23/24  Yes Caro Harlene POUR, NP  mirtazapine  (REMERON ) 15 MG tablet TAKE ONE TABLET BY MOUTH AT BEDTIME 04/15/24  Yes Tullo, Teresa L, MD  Multiple Vitamin (MULTI-VITAMIN) tablet Take 1 tablet by mouth daily. 01/14/24 01/13/25 Yes [provider]  pantoprazole  (PROTONIX ) 40 MG tablet Take one tablet by mouth twice daily. 01/23/24  Yes Eubanks, Jessica K, NP  propranolol  (INDERAL ) 20 MG tablet TAKE 1  TABLET BY MOUTH TWICE DAILY 03/26/24  Yes Tullo, Teresa L, MD  spironolactone  (ALDACTONE ) 50 MG tablet Take 2 tablets (100 mg total) by mouth daily. 01/23/24  Yes Caro Harlene POUR, NP  tamsulosin  (FLOMAX ) 0.4 MG CAPS capsule Take 2 capsules (0.8 mg total) by mouth daily. 01/23/24  Yes Caro Harlene POUR, NP  thiamine  (VITAMIN B1) 100 MG tablet Take 100 mg by mouth. 01/14/24 01/13/25 Yes [provider]  thyroid  (NP THYROID ) 60 MG tablet Take 1 tablet (60 mg total) by mouth daily. 01/23/24  Yes Caro Harlene POUR, NP  acetaminophen  (TYLENOL ) 325 MG tablet Take 650 mg by mouth every 4 (four) hours as needed. Patient taking differently: Take 650 mg by mouth every 8 (eight) hours as needed.    [provider]  cyanocobalamin  (VITAMIN B12) 1000 MCG tablet Take 1,000 mcg by mouth daily. Patient not taking: Reported on 02/17/2024    [provider]  sucralfate  (CARAFATE ) 1 GM/10ML suspension TAKE 10 MLS (1GM TOTAL) BY MOUTH 4 TIMESA DAY - WITH MEALS AND AT BEDTIME FOR 12DAYS Patient not taking: Reported on 04/22/2024 04/07/24   Marylynn Verneita CROME, MD  Testosterone  75 MG PLLT by Implant route. Patient not taking: Reported on 02/17/2024    [provider]    Allergies as of 04/13/2024   (No Known Allergies)    Family History  Problem Relation Age of Onset   Alcohol abuse Mother    Diabetes Mother    Mental retardation Mother    Arthritis Father    Hearing loss Father    Alcohol abuse Father     Social History   Socioeconomic History   Marital status: Married    Spouse name: Not on file   Number of children: 2   Years of education: Not on file   Highest education level: Bachelor's degree (e.g., BA, AB, BS)  Occupational History   Occupation: textiles  Tobacco Use   Smoking status: Former    Current packs/day: 0.00    Average packs/day: 1 pack/day for 15.0 years (15.0 ttl pk-yrs)    Types: Cigarettes    Start date: 06/14/1968    Quit date: 06/15/1983     Years since quitting: 40.8   Smokeless tobacco: Current    Types: Chew  Vaping Use   Vaping status: Never Used  Substance and Sexual Activity   Alcohol use: Not Currently    Comment: pt reports 3 glasses per day maybe a bottle   Drug use: No   Sexual activity: Not Currently  Other Topics Concern   Not on file  Social History Narrative   [employer] graduate   Avid hunter   Very active   Walks 1 hour daily   Social Drivers of Health   Financial Resource Strain: Low Risk  (02/16/2024)   Overall Financial Resource Strain (CARDIA)    Difficulty of Paying  Living Expenses: Not hard at all  Food Insecurity: No Food Insecurity (02/16/2024)   Hunger Vital Sign    Worried About Running Out of Food in the Last Year: Never true    Ran Out of Food in the Last Year: Never true  Transportation Needs: No Transportation Needs (02/16/2024)   PRAPARE - Administrator, Civil Service (Medical): No    Lack of Transportation (Non-Medical): No  Physical Activity: Sufficiently Active (02/16/2024)   Exercise Vital Sign    Days of Exercise per Week: 3 days    Minutes of Exercise per Session: 60 min  Stress: No Stress Concern Present (02/16/2024)   [employer] of Occupational Health - Occupational Stress Questionnaire    Feeling of Stress: Not at all  Social Connections: Moderately Isolated (02/16/2024)   Social Connection and Isolation Panel    Frequency of Communication with Friends and Family: More than three times a week    Frequency of Social Gatherings with Friends and Family: Twice a week    Attends Religious Services: Never    Database Administrator or Organizations: No    Attends Engineer, Structural: Not on file    Marital Status: Married  Catering Manager Violence: Not At Risk (12/26/2023)   Humiliation, Afraid, Rape, and Kick questionnaire    Fear of Current or Ex-Partner: No    Emotionally Abused: No    Physically Abused: No    Sexually Abused: No    Review of  Systems: See HPI, otherwise negative ROS  Physical Exam: BP 117/69   Pulse 78   Temp (!) 97.5 F (36.4 C) (Temporal)   Resp 18   Ht 6' (1.829 m)   Wt 70.3 kg   SpO2 98%   BMI 21.02 kg/m  General:   Alert, cooperative. Head:  Normocephalic and atraumatic. Respiratory:  Normal work of breathing. Cardiovascular:  NAD  Impression/Plan: Tony Luna is here for cataract surgery.  Risks, benefits, limitations, and alternatives regarding cataract surgery have been reviewed with the patient.  Questions have been answered.  All parties agreeable.   Elsie Carmine, MD  04/28/2024, 11:12 AM

## 2024-04-28 NOTE — Telephone Encounter (Signed)
 Pt daughter called and requested that appt be moved out because pt wouldn't be able to make appt - r/s w/pt daughter - pt daughter confirmed date/time - LH

## 2024-04-29 ENCOUNTER — Inpatient Hospital Stay: Admitting: Oncology

## 2024-04-29 ENCOUNTER — Inpatient Hospital Stay

## 2024-05-01 ENCOUNTER — Other Ambulatory Visit: Payer: Self-pay | Admitting: *Deleted

## 2024-05-01 ENCOUNTER — Encounter: Payer: Self-pay | Admitting: *Deleted

## 2024-05-01 DIAGNOSIS — M810 Age-related osteoporosis without current pathological fracture: Secondary | ICD-10-CM

## 2024-05-01 MED ORDER — DENOSUMAB-BBDZ 60 MG/ML ~~LOC~~ SOSY: 60.0000 mg | PREFILLED_SYRINGE | SUBCUTANEOUS | Status: DC

## 2024-05-05 ENCOUNTER — Telehealth: Payer: Self-pay

## 2024-05-05 NOTE — Telephone Encounter (Signed)
 Received and faxed back.

## 2024-05-05 NOTE — Telephone Encounter (Signed)
 Copied from CRM [id number]. Topic: General - Other >> May 05, 2024  3:44 PM Aisha D wrote: Reason for CRM: Tony Luna with [facility] stated that an order for PT and OT was faxed over to the office and wanted to confirm that it was received. CB [phone].

## 2024-05-06 ENCOUNTER — Other Ambulatory Visit

## 2024-05-06 ENCOUNTER — Ambulatory Visit: Admitting: Oncology

## 2024-05-11 ENCOUNTER — Other Ambulatory Visit: Payer: Self-pay | Admitting: Nurse Practitioner

## 2024-05-11 DIAGNOSIS — K7030 Alcoholic cirrhosis of liver without ascites: Secondary | ICD-10-CM

## 2024-05-19 ENCOUNTER — Other Ambulatory Visit: Payer: Self-pay | Admitting: *Deleted

## 2024-05-19 DIAGNOSIS — D696 Thrombocytopenia, unspecified: Secondary | ICD-10-CM

## 2024-05-19 DIAGNOSIS — D539 Nutritional anemia, unspecified: Secondary | ICD-10-CM

## 2024-05-20 ENCOUNTER — Inpatient Hospital Stay (HOSPITAL_BASED_OUTPATIENT_CLINIC_OR_DEPARTMENT_OTHER): Admitting: Oncology

## 2024-05-20 ENCOUNTER — Other Ambulatory Visit: Payer: Self-pay

## 2024-05-20 ENCOUNTER — Inpatient Hospital Stay: Attending: Oncology

## 2024-05-20 VITALS — BP 121/78 | HR 72 | Temp 98.0°F | Resp 17 | Ht 72.0 in | Wt 156.6 lb

## 2024-05-20 DIAGNOSIS — K7030 Alcoholic cirrhosis of liver without ascites: Secondary | ICD-10-CM | POA: Diagnosis not present

## 2024-05-20 DIAGNOSIS — D509 Iron deficiency anemia, unspecified: Secondary | ICD-10-CM | POA: Diagnosis present

## 2024-05-20 DIAGNOSIS — R7989 Other specified abnormal findings of blood chemistry: Secondary | ICD-10-CM

## 2024-05-20 DIAGNOSIS — D696 Thrombocytopenia, unspecified: Secondary | ICD-10-CM | POA: Insufficient documentation

## 2024-05-20 DIAGNOSIS — Z8505 Personal history of malignant neoplasm of liver: Secondary | ICD-10-CM | POA: Diagnosis not present

## 2024-05-20 DIAGNOSIS — D539 Nutritional anemia, unspecified: Secondary | ICD-10-CM

## 2024-05-20 LAB — CBC WITH DIFFERENTIAL/PLATELET
Abs Immature Granulocytes: 0.03 K/uL (ref 0.00–0.07)
Basophils Absolute: 0 K/uL (ref 0.0–0.1)
Basophils Relative: 1 %
Eosinophils Absolute: 0.1 K/uL (ref 0.0–0.5)
Eosinophils Relative: 2 %
HCT: 36.4 % — ABNORMAL LOW (ref 39.0–52.0)
Hemoglobin: 12.6 g/dL — ABNORMAL LOW (ref 13.0–17.0)
Immature Granulocytes: 1 %
Lymphocytes Relative: 28 %
Lymphs Abs: 1.6 K/uL (ref 0.7–4.0)
MCH: 35.8 pg — ABNORMAL HIGH (ref 26.0–34.0)
MCHC: 34.6 g/dL (ref 30.0–36.0)
MCV: 103.4 fL — ABNORMAL HIGH (ref 80.0–100.0)
Monocytes Absolute: 0.8 K/uL (ref 0.1–1.0)
Monocytes Relative: 15 %
Neutro Abs: 2.9 K/uL (ref 1.7–7.7)
Neutrophils Relative %: 53 %
Platelets: 118 K/uL — ABNORMAL LOW (ref 150–400)
RBC: 3.52 MIL/uL — ABNORMAL LOW (ref 4.22–5.81)
RDW: 12.7 % (ref 11.5–15.5)
WBC: 5.5 K/uL (ref 4.0–10.5)
nRBC: 0 % (ref 0.0–0.2)

## 2024-05-20 LAB — CMP (CANCER CENTER ONLY)
ALT: 35 U/L (ref 0–44)
AST: 52 U/L — ABNORMAL HIGH (ref 15–41)
Albumin: 4 g/dL (ref 3.5–5.0)
Alkaline Phosphatase: 202 U/L — ABNORMAL HIGH (ref 38–126)
Anion gap: 13 (ref 5–15)
BUN: 39 mg/dL — ABNORMAL HIGH (ref 8–23)
CO2: 27 mmol/L (ref 22–32)
Calcium: 9.8 mg/dL (ref 8.9–10.3)
Chloride: 96 mmol/L — ABNORMAL LOW (ref 98–111)
Creatinine: 1.54 mg/dL — ABNORMAL HIGH (ref 0.61–1.24)
GFR, Estimated: 45 mL/min — ABNORMAL LOW
Glucose, Bld: 117 mg/dL — ABNORMAL HIGH (ref 70–99)
Potassium: 5.1 mmol/L (ref 3.5–5.1)
Sodium: 136 mmol/L (ref 135–145)
Total Bilirubin: 1.9 mg/dL — ABNORMAL HIGH (ref 0.0–1.2)
Total Protein: 7.2 g/dL (ref 6.5–8.1)

## 2024-05-20 LAB — PROTIME-INR
INR: 1.1 (ref 0.8–1.2)
Prothrombin Time: 15 s (ref 11.4–15.2)

## 2024-05-20 LAB — IRON AND TIBC
Iron: 343 ug/dL — ABNORMAL HIGH (ref 45–182)
Saturation Ratios: 88 % — ABNORMAL HIGH (ref 17.9–39.5)
TIBC: 391 ug/dL (ref 250–450)
UIBC: 48 ug/dL

## 2024-05-20 LAB — FERRITIN: Ferritin: 1019 ng/mL — ABNORMAL HIGH (ref 24–336)

## 2024-05-20 NOTE — Progress Notes (Signed)
 right instep on foot bothers him.  Experiencing constipation advised can try miralax  OTC

## 2024-05-21 LAB — AFP TUMOR MARKER: AFP, Serum, Tumor Marker: 3.1 ng/mL (ref 0.0–6.4)

## 2024-05-24 NOTE — Progress Notes (Signed)
 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 He was recently seen by Dr. Unk in August 2025. He was seen by hematology Dr. Brutus in the past for elevated ferritin which fluctuates between 200-800 and has been mainly attributed to his alcohol consumption. MRI liver in November 2023 showed no evidence of excessive iron deposition. Evidence of cirrhosis. Hemochromatosis testing negative.   Interval history- Discussed the use of AI scribe software for clinical note transcription with the patient, who gave verbal consent to proceed.  History of Present Illness   Tony Luna is an 83 year old male with cirrhosis, anemia, and thrombocytopenia who presents for hematology/oncology follow-up to monitor liver function, anemia, and hepatocellular carcinoma surveillance.  He is currently asymptomatic and denies new health concerns.  Hemoglobin has improved from 10.9 g/dL four months ago to 87.3 g/dL. Liver function tests have also improved.  He continues to consume approximately two small glasses of wine daily, with reduced but ongoing alcohol intake. He is unsure if other lifestyle changes have contributed to laboratory improvement.  He remains under the care of his liver specialist, with  ongoing surveillance for hepatocellular carcinoma. A CT scan in July showed no evidence of malignancy.       ECOG PS- 1 Pain scale- 0  Review of systems- Review of Systems  Constitutional:  Negative for chills, fever, malaise/fatigue and weight loss.  HENT:  Negative for congestion, ear discharge and nosebleeds.   Eyes:  Negative for blurred vision.  Respiratory:  Negative for cough, hemoptysis, sputum production, shortness of breath and wheezing.   Cardiovascular:  Negative for chest pain, palpitations, orthopnea and claudication.  Gastrointestinal:  Negative for abdominal pain, blood in stool, constipation, diarrhea, heartburn, melena, nausea and vomiting.  Genitourinary:  Negative for dysuria, flank pain, frequency, hematuria and urgency.  Musculoskeletal:  Negative for back pain, joint pain and myalgias.  Skin:  Negative for rash.  Neurological:  Negative for dizziness, tingling, focal weakness, seizures, weakness and headaches.  Endo/Heme/Allergies:  Does not bruise/bleed easily.  Psychiatric/Behavioral:  Negative for depression and suicidal ideas. The patient does not have insomnia.       Allergies[1]   Past Medical History:  Diagnosis Date   Alcohol abuse    Alcoholic cirrhosis of liver without ascites (HCC)    Allergy    Arthritis    Bleeding duodenal ulcer    august 2025   Cancer [facility])    skin cancers   Cirrhosis (HCC)    Cirrhosis with alcoholism (HCC)    Complication of anesthesia    :need catheter   Depression    Dysrhythmia    ? something told by reg dr  unable to say what it was   Esophageal tear    Former smoker 07/03/1973   quit 1975   Grade I diastolic dysfunction 10/09/2022   Hypothyroidism    Mallory-Weiss tear  Mild aortic stenosis by prior echocardiogram 10/09/2022   Mild aortic valve regurgitation 10/09/2022   Pneumonia    2 months ago   Portal hypertensive gastropathy (HCC) 02/2024   moderate, no varices noted   Primary osteoarthritis  of left knee 06/22/2014   Raspy voice 03/12/2024   office note   Rotator cuff tear    Sleep apnea corrected- dental device   Substance abuse (HCC)    alcohol abuse     Past Surgical History:  Procedure Laterality Date   BACK SURGERY     blepharaplasty     CATARACT EXTRACTION W/PHACO Left 02/21/2021   Procedure: CATARACT EXTRACTION PHACO AND INTRAOCULAR LENS PLACEMENT (IOC) LEFT 4.56 00:34.9;  Surgeon: Jaye Fallow, MD;  Location: [facility];  Service: Ophthalmology;  Laterality: Left;   CATARACT EXTRACTION W/PHACO Right 04/28/2024   Procedure: PHACOEMULSIFICATION, CATARACT, WITH IOL INSERTION 6.27 00:39.1;  Surgeon: Jaye Fallow, MD;  Location: [facility];  Service: Ophthalmology;  Laterality: Right;   CAUTERY OF TURBINATES     COLONOSCOPY WITH PROPOFOL  N/A 07/19/2015   Procedure: COLONOSCOPY WITH PROPOFOL ;  Surgeon: Rogelia Copping, MD;  Location: [facility] ENDOSCOPY;  Service: Endoscopy;  Laterality: N/A;   COLONOSCOPY WITH PROPOFOL  N/A 03/21/2021   Procedure: COLONOSCOPY WITH PROPOFOL ;  Surgeon: Copping Rogelia, MD;  Location: [facility] ENDOSCOPY;  Service: Endoscopy;  Laterality: N/A;   COLONOSCOPY WITH PROPOFOL  N/A 04/18/2021   Procedure: COLONOSCOPY WITH PROPOFOL ;  Surgeon: Copping Rogelia, MD;  Location: [facility] ENDOSCOPY;  Service: Endoscopy;  Laterality: N/A;   COLONOSCOPY WITH PROPOFOL  N/A 04/24/2022   Procedure: COLONOSCOPY WITH PROPOFOL ;  Surgeon: Copping Rogelia, MD;  Location: [facility] ENDOSCOPY;  Service: Endoscopy;  Laterality: N/A;   ESOPHAGOGASTRODUODENOSCOPY N/A 12/26/2023   Procedure: EGD (ESOPHAGOGASTRODUODENOSCOPY);  Surgeon: Copping Rogelia, MD;  Location: [facility] ENDOSCOPY;  Service: Endoscopy;  Laterality: N/A;   EYE SURGERY     HERNIA REPAIR Left 20+ years ago   inguinal herniorrhapy   HOT HEMOSTASIS  12/26/2023   Procedure: EGD, WITH ARGON PLASMA COAGULATION;  Surgeon: Copping Rogelia, MD;  Location: [facility] ENDOSCOPY;  Service: Endoscopy;;   JOINT REPLACEMENT  left knee  2016   KNEE SURGERY     ? loose body/ chondral defect 37yrs   ROTATOR CUFF REPAIR     left   SPINE SURGERY  10-12 years ago   SUBMUCOSAL INJECTION  12/26/2023   Procedure: INJECTION, SUBMUCOSAL;  Surgeon: Copping Rogelia, MD;  Location: [facility] ENDOSCOPY;  Service: Endoscopy;;   TONSILLECTOMY     TOTAL KNEE ARTHROPLASTY Left 06/22/2014   Procedure: TOTAL KNEE ARTHROPLASTY;  Surgeon: Fonda SHAUNNA Olmsted, MD;  Location: [facility];  Service: Orthopedics;  Laterality: Left;    Social History   Socioeconomic History   Marital status: Married    Spouse name: Not on file   Number of children: 2   Years of education: Not on file   Highest education level: Bachelor's degree (e.g., BA, AB, BS)  Occupational History   Occupation: textiles  Tobacco Use   Smoking status: Former    Current packs/day: 0.00    Average packs/day: 1 pack/day for 15.0 years (15.0 ttl pk-yrs)    Types: Cigarettes    Start date: 06/14/1968    Quit date: 06/15/1983    Years since quitting: 40.9   Smokeless tobacco: Current    Types: Chew  Vaping Use   Vaping status: Never Used  Substance and Sexual Activity   Alcohol use: Not Currently    Comment: pt reports 3 glasses per day  maybe a bottle   Drug use: No   Sexual activity: Not Currently  Other Topics Concern   Not on file  Social History Narrative   [employer] graduate   Avid hunter   Very active   Walks 1 hour daily   Social Drivers of Health   Tobacco Use: High Risk (04/28/2024)   Patient History    Smoking Tobacco Use: Former    Smokeless Tobacco Use: Current    Passive Exposure: Not on file  Financial Resource Strain: Low Risk (02/16/2024)   Overall Financial Resource Strain (CARDIA)    Difficulty of Paying Living Expenses: Not hard at all  Food Insecurity: No Food Insecurity (02/16/2024)   Epic    Worried About Programme Researcher, Broadcasting/film/video in the Last Year: Never true    Ran Out of Food in the Last Year: Never true  Transportation Needs: No Transportation Needs  (02/16/2024)   Epic    Lack of Transportation (Medical): No    Lack of Transportation (Non-Medical): No  Physical Activity: Sufficiently Active (02/16/2024)   Exercise Vital Sign    Days of Exercise per Week: 3 days    Minutes of Exercise per Session: 60 min  Stress: No Stress Concern Present (02/16/2024)   [employer] of Occupational Health - Occupational Stress Questionnaire    Feeling of Stress: Not at all  Social Connections: Moderately Isolated (02/16/2024)   Social Connection and Isolation Panel    Frequency of Communication with Friends and Family: More than three times a week    Frequency of Social Gatherings with Friends and Family: Twice a week    Attends Religious Services: Never    Database Administrator or Organizations: No    Attends Engineer, Structural: Not on file    Marital Status: Married  Catering Manager Violence: Not At Risk (12/26/2023)   Epic    Fear of Current or Ex-Partner: No    Emotionally Abused: No    Physically Abused: No    Sexually Abused: No  Depression (PHQ2-9): Low Risk (05/20/2024)   Depression (PHQ2-9)    PHQ-2 Score: 0  Alcohol Screen: Medium Risk (02/16/2024)   Alcohol Screen    Last Alcohol Screening Score (AUDIT): 13  Housing: Low Risk (02/16/2024)   Epic    Unable to Pay for Housing in the Last Year: No    Number of Times Moved in the Last Year: 1    Homeless in the Last Year: No  Utilities: Not At Risk (01/14/2024)   Received from [facility]   Epic    In the past 12 months has the electric, gas, oil, or water  company threatened to shut off services in your home?: No  Health Literacy: Adequate Health Literacy (07/03/2023)   B1300 Health Literacy    Frequency of need for help with medical instructions: Never    Family History  Problem Relation Age of Onset   Alcohol abuse Mother    Diabetes Mother    Mental retardation Mother    Arthritis Father    Hearing loss Father    Alcohol abuse Father      Current Medications[2]  Physical exam:  Vitals:   05/20/24 1108  BP: 121/78  Pulse: 72  Resp: 17  Temp: 98 F (36.7 C)  TempSrc: Tympanic  SpO2: 98%  Weight: 156 lb 9.6 oz (71 kg)  Height: 6' (1.829 m)   Physical Exam Cardiovascular:     Rate and Rhythm: Normal rate and  regular rhythm.     Heart sounds: Normal heart sounds.  Pulmonary:     Effort: Pulmonary effort is normal.     Breath sounds: Normal breath sounds.  Abdominal:     General: Bowel sounds are normal.     Palpations: Abdomen is soft.  Musculoskeletal:     Right lower leg: No edema.     Left lower leg: No edema.  Skin:    General: Skin is warm and dry.  Neurological:     Mental Status: He is alert and oriented to person, place, and time.      I have personally reviewed labs listed below:    Latest Ref Rng & Units 05/20/2024   10:20 AM  CMP  Glucose 70 - 99 mg/dL 882   BUN 8 - 23 mg/dL 39   Creatinine 9.38 - 1.24 mg/dL 8.45   Sodium 864 - 854 mmol/L 136   Potassium 3.5 - 5.1 mmol/L 5.1   Chloride 98 - 111 mmol/L 96   CO2 22 - 32 mmol/L 27   Calcium 8.9 - 10.3 mg/dL 9.8   Total Protein 6.5 - 8.1 g/dL 7.2   Total Bilirubin 0.0 - 1.2 mg/dL 1.9   Alkaline Phos 38 - 126 U/L 202   AST 15 - 41 U/L 52   ALT 0 - 44 U/L 35       Latest Ref Rng & Units 05/20/2024   10:20 AM  CBC  WBC 4.0 - 10.5 K/uL 5.5   Hemoglobin 13.0 - 17.0 g/dL 87.3   Hematocrit 60.9 - 52.0 % 36.4   Platelets 150 - 400 K/uL 118      Assessment and plan- Patient is a 83 y.o. male here for routine f/u of elevated ferritin and thrombocytopenia  Assessment and Plan    Cirrhosis of liver Chronic cirrhosis with improved liver function tests, mildly abnormal. No hepatocellular carcinoma on recent CT. Under hepatology management. - Repeat blood work in six months. - Continue follow-up with hepatology (Dr. Unk) as scheduled. - Return to hematology/oncology clinic in one year.  Anemia, improved Anemia resolved, hemoglobin  increased to 12.6 g/dL. No symptoms attributable to anemia. - Repeat blood work in six months. I will see him in 1 year    Elevated ferritin: likely secondary to ongoing alcohol use. Hemochromatosis testing negative     Visit Diagnosis 1. Thrombocytopenia   2. Elevated ferritin      Dr. Annah Skene, MD, MPH [facility] [phone] 05/24/2024 10:54 AM                   [1] No Known Allergies [2]  Current Outpatient Medications:    acetaminophen  (TYLENOL ) 325 MG tablet, Take 650 mg by mouth every 4 (four) hours as needed. (Patient taking differently: Take 650 mg by mouth every 8 (eight) hours as needed.), Disp: , Rfl:    cyanocobalamin  (VITAMIN B12) 1000 MCG tablet, Take 1,000 mcg by mouth daily., Disp: , Rfl:    folic acid  (FOLVITE ) 1 MG tablet, Take 1 mg by mouth., Disp: , Rfl:    furosemide  (LASIX ) 20 MG tablet, Take 2 tablets (40 mg total) by mouth daily., Disp: 60 tablet, Rfl: 0   Multiple Vitamin (MULTI-VITAMIN) tablet, Take 1 tablet by mouth daily., Disp: , Rfl:    propranolol  (INDERAL ) 20 MG tablet, TAKE 1 TABLET BY MOUTH TWICE DAILY, Disp: 180 tablet, Rfl: 3   tamsulosin  (FLOMAX ) 0.4 MG CAPS capsule, Take 2 capsules (0.8  mg total) by mouth daily., Disp: 60 capsule, Rfl: 0   thiamine  (VITAMIN B1) 100 MG tablet, Take 100 mg by mouth., Disp: , Rfl:    thyroid  (NP THYROID ) 60 MG tablet, Take 1 tablet (60 mg total) by mouth daily., Disp: 30 tablet, Rfl: 0   mirtazapine  (REMERON ) 15 MG tablet, TAKE ONE TABLET BY MOUTH AT BEDTIME, Disp: 30 tablet, Rfl: 2   pantoprazole  (PROTONIX ) 40 MG tablet, Take one tablet by mouth twice daily., Disp: 60 tablet, Rfl: 0   spironolactone  (ALDACTONE ) 50 MG tablet, Take 2 tablets (100 mg total) by mouth daily., Disp: 60 tablet, Rfl: 0   sucralfate  (CARAFATE ) 1 GM/10ML suspension, TAKE 10 MLS (1GM TOTAL) BY MOUTH 4 TIMESA DAY - WITH MEALS AND AT BEDTIME FOR 12DAYS (Patient not taking: Reported on 04/22/2024),  Disp: 473 mL, Rfl: 0   Testosterone  75 MG PLLT, by Implant route. (Patient not taking: Reported on 02/17/2024), Disp: , Rfl:   Current Facility-Administered Medications:    denosumab -bbdz (JUBBONTI ) injection 60 mg, 60 mg, Subcutaneous, Q6 months, Tullo, Verneita CROME, MD  "

## 2024-05-26 ENCOUNTER — Ambulatory Visit (INDEPENDENT_AMBULATORY_CARE_PROVIDER_SITE_OTHER): Admitting: Internal Medicine

## 2024-05-26 VITALS — BP 100/60 | HR 81 | Temp 97.9°F | Ht 72.0 in | Wt 158.8 lb

## 2024-05-26 DIAGNOSIS — R7301 Impaired fasting glucose: Secondary | ICD-10-CM

## 2024-05-26 DIAGNOSIS — D696 Thrombocytopenia, unspecified: Secondary | ICD-10-CM

## 2024-05-26 DIAGNOSIS — E291 Testicular hypofunction: Secondary | ICD-10-CM

## 2024-05-26 DIAGNOSIS — D689 Coagulation defect, unspecified: Secondary | ICD-10-CM | POA: Diagnosis not present

## 2024-05-26 DIAGNOSIS — K264 Chronic or unspecified duodenal ulcer with hemorrhage: Secondary | ICD-10-CM | POA: Diagnosis not present

## 2024-05-26 DIAGNOSIS — E43 Unspecified severe protein-calorie malnutrition: Secondary | ICD-10-CM

## 2024-05-26 DIAGNOSIS — R944 Abnormal results of kidney function studies: Secondary | ICD-10-CM | POA: Diagnosis not present

## 2024-05-26 DIAGNOSIS — K7031 Alcoholic cirrhosis of liver with ascites: Secondary | ICD-10-CM

## 2024-05-26 DIAGNOSIS — E039 Hypothyroidism, unspecified: Secondary | ICD-10-CM | POA: Diagnosis not present

## 2024-05-26 DIAGNOSIS — K7030 Alcoholic cirrhosis of liver without ascites: Secondary | ICD-10-CM

## 2024-05-26 DIAGNOSIS — M810 Age-related osteoporosis without current pathological fracture: Secondary | ICD-10-CM | POA: Diagnosis not present

## 2024-05-26 DIAGNOSIS — Z23 Encounter for immunization: Secondary | ICD-10-CM

## 2024-05-26 DIAGNOSIS — E785 Hyperlipidemia, unspecified: Secondary | ICD-10-CM

## 2024-05-26 DIAGNOSIS — R030 Elevated blood-pressure reading, without diagnosis of hypertension: Secondary | ICD-10-CM

## 2024-05-26 DIAGNOSIS — R7989 Other specified abnormal findings of blood chemistry: Secondary | ICD-10-CM

## 2024-05-26 DIAGNOSIS — R2689 Other abnormalities of gait and mobility: Secondary | ICD-10-CM

## 2024-05-26 DIAGNOSIS — N1831 Chronic kidney disease, stage 3a: Secondary | ICD-10-CM

## 2024-05-26 DIAGNOSIS — Z6821 Body mass index (BMI) 21.0-21.9, adult: Secondary | ICD-10-CM | POA: Diagnosis not present

## 2024-05-26 DIAGNOSIS — Z961 Presence of intraocular lens: Secondary | ICD-10-CM

## 2024-05-26 MED ORDER — SPIRONOLACTONE 50 MG PO TABS: 100.0000 mg | ORAL_TABLET | Freq: Every day | ORAL | 0 refills | Status: AC

## 2024-05-26 MED ORDER — PANTOPRAZOLE SODIUM 40 MG PO TBEC: DELAYED_RELEASE_TABLET | ORAL | 1 refills | Status: AC

## 2024-05-26 MED ORDER — FUROSEMIDE 20 MG PO TABS: ORAL_TABLET | ORAL | 3 refills | Status: AC

## 2024-05-26 MED ORDER — MIRTAZAPINE 15 MG PO TABS: 15.0000 mg | ORAL_TABLET | Freq: Every day | ORAL | 2 refills | Status: AC

## 2024-05-26 NOTE — Assessment & Plan Note (Signed)
 Diagnosed in 2016, secondary to alcohol, with ongoing use . He continues to assert that he has his alcohol use uncer control ;  drinking 2 glasses of wine daily .  Alcohol abstinence strongly advised .   Lab Results  Component Value Date   ALT 35 05/20/2024   AST 52 (H) 05/20/2024   ALKPHOS 202 (H) 05/20/2024   BILITOT 1.9 (H) 05/20/2024   , secondar

## 2024-05-26 NOTE — Progress Notes (Unsigned)
 "  Subjective:  Patient ID: Tony Luna, male    DOB: 1941/04/15  Age: 83 y.o. MRN: [id number]  CC: The primary encounter diagnosis was Acquired hypothyroidism. Diagnoses of Protein-calorie malnutrition, severe, Impaired fasting glucose, Hyperlipidemia, unspecified hyperlipidemia type, and White coat syndrome with high blood pressure without hypertension were also pertinent to this visit.   HPI Tony Luna presents for  Chief Complaint  Patient presents with   Medical Management of Chronic Issues    Tony Luna is an 83 year old male with cirrhosis, anemia, and thrombocytopenia who is beining followed by  hematology/oncology  and hepatology for management follow-up to monitor liver function, anemia, and hepatocellular carcinoma surveillance   Anemia is improving.    Alcohol abuse: Still drinking  glasses of wine daily .  Last MRI negative for hepatocellular CA   Cirrhosis :  weight fluctuates a bit.  Taking furosemide  40 and spironolactone  100 mg daily .  Cr has doubled and he has tenting on exam.   Outpatient Medications Prior to Visit  Medication Sig Dispense Refill   acetaminophen  (TYLENOL ) 325 MG tablet Take 650 mg by mouth every 4 (four) hours as needed. (Patient taking differently: Take 650 mg by mouth every 8 (eight) hours as needed.)     cyanocobalamin  (VITAMIN B12) 1000 MCG tablet Take 1,000 mcg by mouth daily.     folic acid  (FOLVITE ) 1 MG tablet Take 1 mg by mouth.     furosemide  (LASIX ) 40 MG tablet Take 40 mg by mouth daily.     mirtazapine  (REMERON ) 15 MG tablet TAKE ONE TABLET BY MOUTH AT BEDTIME 30 tablet 2   Multiple Vitamin (MULTI-VITAMIN) tablet Take 1 tablet by mouth daily.     pantoprazole  (PROTONIX ) 40 MG tablet Take one tablet by mouth twice daily. 60 tablet 0   prednisoLONE acetate (PRED FORTE) 1 % ophthalmic suspension      propranolol  (INDERAL ) 20 MG tablet TAKE 1 TABLET BY MOUTH TWICE DAILY 180 tablet 3   sertraline  (ZOLOFT ) 50 MG tablet Take 50 mg by mouth  daily.     spironolactone  (ALDACTONE ) 100 MG tablet Take 100 mg by mouth once.     tamsulosin  (FLOMAX ) 0.4 MG CAPS capsule Take 2 capsules (0.8 mg total) by mouth daily. 60 capsule 0   thiamine  (VITAMIN B1) 100 MG tablet Take 100 mg by mouth.     thyroid  (NP THYROID ) 60 MG tablet Take 1 tablet (60 mg total) by mouth daily. 30 tablet 0   XIFAXAN  550 MG TABS tablet Take 550 mg by mouth 2 (two) times daily.     sucralfate  (CARAFATE ) 1 GM/10ML suspension TAKE 10 MLS (1GM TOTAL) BY MOUTH 4 TIMESA DAY - WITH MEALS AND AT BEDTIME FOR 12DAYS (Patient not taking: Reported on 05/26/2024) 473 mL 0   Testosterone  75 MG PLLT by Implant route. (Patient not taking: Reported on 05/26/2024)     furosemide  (LASIX ) 20 MG tablet Take 2 tablets (40 mg total) by mouth daily. 60 tablet 0   spironolactone  (ALDACTONE ) 50 MG tablet Take 2 tablets (100 mg total) by mouth daily. 60 tablet 0   Facility-Administered Medications Prior to Visit  Medication Dose Route Frequency Provider Last Rate Last Admin   denosumab -bbdz (JUBBONTI ) injection 60 mg  60 mg Subcutaneous Q6 months Marylynn Verneita CROME, MD        Review of Systems;  Patient denies headache, fevers, malaise, unintentional weight loss, skin rash, eye pain, sinus congestion and sinus pain, sore throat, dysphagia,  hemoptysis ,  cough, dyspnea, wheezing, chest pain, palpitations, orthopnea, edema, abdominal pain, nausea, melena, diarrhea, constipation, flank pain, dysuria, hematuria, urinary  Frequency, nocturia, numbness, tingling, seizures,  Focal weakness, Loss of consciousness,  Tremor, insomnia, depression, anxiety, and suicidal ideation.      Objective:  BP 100/60   Pulse 81   Temp 97.9 F (36.6 C) (Oral)   Ht 6' (1.829 m)   Wt 158 lb 12.8 oz (72 kg)   SpO2 97%   BMI 21.54 kg/m   BP Readings from Last 3 Encounters:  05/26/24 100/60  05/20/24 121/78  04/28/24 104/65    Wt Readings from Last 3 Encounters:  05/26/24 158 lb 12.8 oz (72 kg)  05/20/24  156 lb 9.6 oz (71 kg)  04/28/24 154 lb 15.7 oz (70.3 kg)    Physical Exam  Lab Results  Component Value Date   HGBA1C 4.4 (L) 09/23/2023   HGBA1C 4.7 03/22/2023   HGBA1C 4.9 07/02/2022    Lab Results  Component Value Date   CREATININE 1.54 (H) 05/20/2024   CREATININE 0.7 01/06/2024   CREATININE 0.69 01/01/2024    Lab Results  Component Value Date   WBC 5.5 05/20/2024   HGB 12.6 (L) 05/20/2024   HCT 36.4 (L) 05/20/2024   PLT 118 (L) 05/20/2024   GLUCOSE 117 (H) 05/20/2024   CHOL 202 (H) 09/23/2023   TRIG 61.0 09/23/2023   HDL 106.60 09/23/2023   LDLDIRECT 74.0 09/23/2023   LDLCALC 84 09/23/2023   ALT 35 05/20/2024   AST 52 (H) 05/20/2024   NA 136 05/20/2024   K 5.1 05/20/2024   CL 96 (L) 05/20/2024   CREATININE 1.54 (H) 05/20/2024   BUN 39 (H) 05/20/2024   CO2 27 05/20/2024   TSH 4.700 (H) 01/22/2024   PSA 1.65 07/02/2022   INR 1.1 05/20/2024   HGBA1C 4.4 (L) 09/23/2023    No results found.  Assessment & Plan:  .Acquired hypothyroidism  Protein-calorie malnutrition, severe  Impaired fasting glucose  Hyperlipidemia, unspecified hyperlipidemia type  White coat syndrome with high blood pressure without hypertension     I spent 34 minutes on the day of this face to face encounter reviewing patient's  most recent visit with cardiology,  nephrology,  and neurology,  prior relevant surgical and non surgical procedures, recent  labs and imaging studies, counseling on weight management,  reviewing the assessment and plan with patient, and post visit ordering and reviewing of  diagnostics and therapeutics with patient  .   Follow-up: No follow-ups on file.   Verneita LITTIE Kettering, MD "

## 2024-05-26 NOTE — Patient Instructions (Addendum)
 I will make a referral to [facility] to help manage any kidney problems that may be coming up.   Make sure you are still taking pantoprazole  twice daily to protect stomach  Your liver dysfunction makes you bleed very easily and for long time  Reduce furosemide  to 20 mg alternating with 40 mg daily   Changing spironolactone  to 50 mg tablets because they are easier to swallow.  Take 2 daily   Return for repeat kidney function test in 2 weeks

## 2024-05-27 ENCOUNTER — Ambulatory Visit: Admitting: Internal Medicine

## 2024-05-27 ENCOUNTER — Encounter: Payer: Self-pay | Admitting: Internal Medicine

## 2024-05-27 DIAGNOSIS — R944 Abnormal results of kidney function studies: Secondary | ICD-10-CM | POA: Insufficient documentation

## 2024-05-27 DIAGNOSIS — Z961 Presence of intraocular lens: Secondary | ICD-10-CM | POA: Insufficient documentation

## 2024-05-27 NOTE — Assessment & Plan Note (Signed)
 Previously managed by [facility] ) with implantation of Testim  . Supplementation stopped due to more pressing comorbidities

## 2024-05-27 NOTE — Assessment & Plan Note (Signed)
 Secondary to cirrhosis.SABRA reviewed his risks of bleeding

## 2024-05-27 NOTE — Assessment & Plan Note (Signed)
 CR has doubled on last week's labs.  ,  GFR now < 50 ml/min.  He has signs of dehydration on exam but given his history of cirrhosis with ascites he may be developing hepatorenal syndroms.  Will reduce furosemide  to 20 mg daily alternating with 40 mg and refer to nephrology .  Repeat BMET in 2 weeks   Lab Results  Component Value Date   CREATININE 1.54 (H) 05/20/2024

## 2024-05-27 NOTE — Assessment & Plan Note (Signed)
 Treated with cauterization December 26 2023.  Continue protonix  bid .  He has been repeatedly advised to refrain from all alcohol

## 2024-05-27 NOTE — Assessment & Plan Note (Signed)
 His balance is improving with PT and improvement in diet., but he continues to require use of walker.   Continue thiamine  and other vitamin supplementation    Lab Results  Component Value Date   VITAMINB12 1,464 (H) 01/22/2024   Lab Results  Component Value Date   FOLATE >23.2 01/22/2024

## 2024-05-27 NOTE — Assessment & Plan Note (Signed)
 Blood pressures are too soft to start an ARB

## 2024-05-27 NOTE — Assessment & Plan Note (Addendum)
 Iron stores are elevated due to cirrhosis  ; no evidence of hereditary hemochromatosis by recent screening

## 2024-05-27 NOTE — Assessment & Plan Note (Signed)
 Stable,  Likely secondary to cirrhosis and portal hypertension   Lab Results  Component Value Date   WBC 5.5 05/20/2024   HGB 12.6 (L) 05/20/2024   HCT 36.4 (L) 05/20/2024   MCV 103.4 (H) 05/20/2024   PLT 118 (L) 05/20/2024

## 2024-05-27 NOTE — Assessment & Plan Note (Signed)
 Weight has stabilized per home surveillance readings and he is drinking 2 Ensures daily to boost his protein intake

## 2024-06-01 ENCOUNTER — Other Ambulatory Visit: Payer: Self-pay

## 2024-06-01 ENCOUNTER — Emergency Department

## 2024-06-01 ENCOUNTER — Emergency Department
Admission: EM | Admit: 2024-06-01 | Discharge: 2024-06-01 | Disposition: A | Discharge status: Home / Self Care | Attending: Emergency Medicine | Admitting: Emergency Medicine

## 2024-06-01 DIAGNOSIS — W19XXXA Unspecified fall, initial encounter: Secondary | ICD-10-CM

## 2024-06-01 DIAGNOSIS — E871 Hypo-osmolality and hyponatremia: Secondary | ICD-10-CM | POA: Insufficient documentation

## 2024-06-01 DIAGNOSIS — W010XXA Fall on same level from slipping, tripping and stumbling without subsequent striking against object, initial encounter: Secondary | ICD-10-CM | POA: Diagnosis not present

## 2024-06-01 DIAGNOSIS — S0191XA Laceration without foreign body of unspecified part of head, initial encounter: Secondary | ICD-10-CM

## 2024-06-01 DIAGNOSIS — S0181XA Laceration without foreign body of other part of head, initial encounter: Secondary | ICD-10-CM | POA: Insufficient documentation

## 2024-06-01 DIAGNOSIS — S0990XA Unspecified injury of head, initial encounter: Secondary | ICD-10-CM | POA: Diagnosis present

## 2024-06-01 DIAGNOSIS — Z23 Encounter for immunization: Secondary | ICD-10-CM | POA: Insufficient documentation

## 2024-06-01 LAB — CBC
HCT: 35.6 % — ABNORMAL LOW (ref 39.0–52.0)
Hemoglobin: 12.9 g/dL — ABNORMAL LOW (ref 13.0–17.0)
MCH: 36.4 pg — ABNORMAL HIGH (ref 26.0–34.0)
MCHC: 36.2 g/dL — ABNORMAL HIGH (ref 30.0–36.0)
MCV: 100.6 fL — ABNORMAL HIGH (ref 80.0–100.0)
Platelets: 132 K/uL — ABNORMAL LOW (ref 150–400)
RBC: 3.54 MIL/uL — ABNORMAL LOW (ref 4.22–5.81)
RDW: 12 % (ref 11.5–15.5)
WBC: 5.3 K/uL (ref 4.0–10.5)
nRBC: 0 % (ref 0.0–0.2)

## 2024-06-01 LAB — COMPREHENSIVE METABOLIC PANEL WITH GFR
ALT: 34 U/L (ref 0–44)
AST: 58 U/L — ABNORMAL HIGH (ref 15–41)
Albumin: 4 g/dL (ref 3.5–5.0)
Alkaline Phosphatase: 154 U/L — ABNORMAL HIGH (ref 38–126)
Anion gap: 18 — ABNORMAL HIGH (ref 5–15)
BUN: 45 mg/dL — ABNORMAL HIGH (ref 8–23)
CO2: 22 mmol/L (ref 22–32)
Calcium: 9.7 mg/dL (ref 8.9–10.3)
Chloride: 90 mmol/L — ABNORMAL LOW (ref 98–111)
Creatinine, Ser: 1.46 mg/dL — ABNORMAL HIGH (ref 0.61–1.24)
GFR, Estimated: 48 mL/min — ABNORMAL LOW
Glucose, Bld: 104 mg/dL — ABNORMAL HIGH (ref 70–99)
Potassium: 5.1 mmol/L (ref 3.5–5.1)
Sodium: 130 mmol/L — ABNORMAL LOW (ref 135–145)
Total Bilirubin: 1.8 mg/dL — ABNORMAL HIGH (ref 0.0–1.2)
Total Protein: 7.3 g/dL (ref 6.5–8.1)

## 2024-06-01 LAB — URINALYSIS, ROUTINE W REFLEX MICROSCOPIC
Bilirubin Urine: NEGATIVE
Glucose, UA: NEGATIVE mg/dL
Hgb urine dipstick: NEGATIVE
Ketones, ur: NEGATIVE mg/dL
Leukocytes,Ua: NEGATIVE
Nitrite: NEGATIVE
Protein, ur: NEGATIVE mg/dL
Specific Gravity, Urine: 1.014 (ref 1.005–1.030)
pH: 5 (ref 5.0–8.0)

## 2024-06-01 LAB — TROPONIN T, HIGH SENSITIVITY
Troponin T High Sensitivity: 50 ng/L — ABNORMAL HIGH (ref 0–19)
Troponin T High Sensitivity: 47 ng/L — ABNORMAL HIGH (ref 0–19)

## 2024-06-01 LAB — CBG MONITORING, ED: Glucose-Capillary: 106 mg/dL — ABNORMAL HIGH (ref 70–99)

## 2024-06-01 LAB — CK: Total CK: 105 U/L (ref 49–397)

## 2024-06-01 MED ORDER — TETANUS-DIPHTH-ACELL PERTUSSIS 5-2-15.5 LF-MCG/0.5 IM SUSP
0.5000 mL | Freq: Once | INTRAMUSCULAR | Status: AC
  Administered 2024-06-01: 0.5 mL via INTRAMUSCULAR
  Filled 2024-06-01: qty 0.5

## 2024-06-01 MED ORDER — SODIUM CHLORIDE 0.9 % IV BOLUS
1000.0000 mL | Freq: Once | INTRAVENOUS | Status: AC
  Administered 2024-06-01: 1000 mL via INTRAVENOUS

## 2024-06-01 NOTE — Discharge Instructions (Addendum)
 Your sodium and chloride were slightly low most likely secondary to some dehydration.  You should return to the ER if you develop worsening symptoms or any other concerns.  Please call your primary care doctor for a follow-up and try to drink plenty of Gatorade, Pedialyte.

## 2024-06-01 NOTE — ED Notes (Signed)
 Patient was able to stand on the side of the bed with minimal assistance and provided a UA sample.

## 2024-06-01 NOTE — ED Notes (Addendum)
 Added on CK

## 2024-06-01 NOTE — ED Triage Notes (Signed)
 Patient from [facility]. Had a fall couple hours ago. Had 3 glasses of wine earlier. Patient hit head on door frame. No thinners but did loose consciousness. AOX4

## 2024-06-01 NOTE — ED Provider Notes (Signed)
 "  [facility] Provider Note    Event Date/Time   First MD Initiated Contact with Patient 06/01/24 0408     (approximate)   History   No chief complaint on file.   HPI  Tony Luna is a 84 y.o. male not on blood thinners but does have a history of thrombocytopenia.  I reviewed a chart from oncology on 05/18/2024 where patient has history of low platelets.  Did have evidence of cirrhosis on prior CT imaging does report drinking alcohol.  He reports drinking 2-3 small glasses of wine daily.  Patient reports that he had gotten up out of his bed to go to the bathroom when he was walking he actually hit his head on a door frame.  He states that then he saw all the blood on his close so he was not pain attention and tripped and fell into his couch.  He denies falling onto the ground.  He reports having issues with GI bleeding recently and so he has been slightly deconditioned from recent hospitalization from this.  He reports that he just felt weak and figured he should give EMS something to do and so pushed his life alert button.  He denies any chest pain, shortness of breath, black tarry stools or any other concerns.  He denies any syncope contrary to triage note.  He denies any chest pain, shortness of breath.  He denies any pain.   Physical Exam   Triage Vital Signs: ED Triage Vitals  Encounter Vitals Group     BP      Girls Systolic BP Percentile      Girls Diastolic BP Percentile      Boys Systolic BP Percentile      Boys Diastolic BP Percentile      Pulse      Resp      Temp      Temp src      SpO2      Weight      Height      Head Circumference      Peak Flow      Pain Score      Pain Loc      Pain Education      Exclude from Growth Chart     Most recent vital signs: Vitals:   06/01/24 0410  BP: 118/76  Pulse: 70  Resp: 17  Temp: 98.5 F (36.9 C)  SpO2: 100%     General: Awake, no distress.  CV:  Good peripheral perfusion.   Resp:  Normal effort.  Abd:  No distention.  Soft and nontender Other:  Patient is alert.  He is answering all questions appropriately.  Moving all extremities.  Cranial nerves appear intact.  He has got abrasion noted to his right forehead.  He is able to lift up both legs without any tenderness on his hips.  He denies any chest wall tenderness, no abdominal tenderness   ED Results / Procedures / Treatments   Labs (all labs ordered are listed, but only abnormal results are displayed) Labs Reviewed  COMPREHENSIVE METABOLIC PANEL WITH GFR - Abnormal; Notable for the following components:      Result Value   Sodium 130 (*)    Chloride 90 (*)    Glucose, Bld 104 (*)    BUN 45 (*)    Creatinine, Ser 1.46 (*)    AST 58 (*)    Alkaline Phosphatase 154 (*)    Total  Bilirubin 1.8 (*)    GFR, Estimated 48 (*)    Anion gap 18 (*)    All other components within normal limits  CBC - Abnormal; Notable for the following components:   RBC 3.54 (*)    Hemoglobin 12.9 (*)    HCT 35.6 (*)    MCV 100.6 (*)    MCH 36.4 (*)    MCHC 36.2 (*)    Platelets 132 (*)    All other components within normal limits  URINALYSIS, ROUTINE W REFLEX MICROSCOPIC - Abnormal; Notable for the following components:   Color, Urine YELLOW (*)    APPearance CLEAR (*)    All other components within normal limits  CBG MONITORING, ED - Abnormal; Notable for the following components:   Glucose-Capillary 106 (*)    All other components within normal limits  TROPONIN T, HIGH SENSITIVITY - Abnormal; Notable for the following components:   Troponin T High Sensitivity 50 (*)    All other components within normal limits  CK  ETHANOL  TROPONIN T, HIGH SENSITIVITY     EKG  My interpretation of EKG:  Sinus rate of 75 without any ST elevation or T wave versions, normal intervals  RADIOLOGY I have reviewed the ct  personally and interpreted no evidence of intracranial hemorrhage   PROCEDURES:  Critical Care  performed: No  .1-3 Lead EKG Interpretation  Performed by: Ernest Ronal BRAVO, MD Authorized by: Ernest Ronal BRAVO, MD     Interpretation: normal     ECG rate:  70   ECG rate assessment: normal     Rhythm: sinus rhythm     Ectopy: none     Conduction: normal   .Laceration Repair  Date/Time: 06/01/2024 6:04 AM  Performed by: Ernest Ronal BRAVO, MD Authorized by: Ernest Ronal BRAVO, MD   Consent:    Consent obtained:  Verbal   Consent given by:  Patient   Risks discussed:  Infection   Alternatives discussed:  No treatment Universal protocol:    Patient identity confirmed:  Verbally with patient Anesthesia:    Anesthesia method:  None Laceration details:    Location:  Face   Face location:  Forehead   Length (cm):  1   Depth (mm):  1 Treatment:    Area cleansed with:  Saline and chlorhexidine  Skin repair:    Repair method:  Tissue adhesive Approximation:    Approximation:  Close Repair type:    Repair type:  Simple Post-procedure details:    Dressing:  Adhesive bandage   Procedure completion:  Tolerated well, no immediate complications    MEDICATIONS ORDERED IN ED: Medications  Tdap (ADACEL ) injection 0.5 mL (0.5 mLs Intramuscular Given 06/01/24 0447)     IMPRESSION / MDM / ASSESSMENT AND PLAN / ED COURSE  I reviewed the triage vital signs and the nursing notes.   Patient's presentation is most consistent with acute presentation with potential threat to life or bodily function.   Patient comes in with concerns for mechanical fall and injury.  There was some concern initially of syncope but patient is denying syncopal episode.  Patient declines wanting anything for pain.  He has no chest wall tenderness or hip tenderness and is able to stand up at bedside without any issues.  His CMP does show slightly low sodium and chloride so we will give some IV fluids.  He does have a little bit of an anion gap elevation but glucose is 100 do not think this is consistent with  DKA.  He is got  normal bicarb.  He has got known cirrhosis with slightly elevated LFTs his urine without evidence of UTI CBC shows stable hemoglobin and white count was normal.  His troponin was slightly elevated but we will get a repeat this could just be secondary to his CKD.  Patient was okay with proceeding with some IV fluids.  Did add on a CK just to ensure no evidence of rhabdo given it was unclear with EMS how long patient had been down for but to me patient is adamant that he was not on the ground very long.  Also again discussed with about the syncopal and he he states he is not sure why EMS said that he knows for sure that he did not pass out therefore given reassuring EKG and if troponins are stable I think it could be reasonable for patient to go home patient also expressed high desire to go home and given there is no report of actual syncope per patient he would prefer outpatient management.  IMPRESSION: 1. No acute intracranial abnormality. 2. Atrophy with chronic small vessel ischemic disease. 3. No evidence for cervical spine fracture. 4. Advanced degenerative changes in the cervical spine as above.  Patient will be discharged home after repeat troponin and CK.  He understands the importance of adequate hydration including Gatorade, Pedialyte   The patient is on the cardiac monitor to evaluate for evidence of arrhythmia and/or significant heart rate changes.      FINAL CLINICAL IMPRESSION(S) / ED DIAGNOSES   Final diagnoses:  Fall, initial encounter  Laceration of head without foreign body, unspecified part of head, initial encounter  Hyponatremia     Rx / DC Orders   ED Discharge Orders     None        Note:  This document was prepared using Dragon voice recognition software and may include unintentional dictation errors.   Ernest Ronal BRAVO, MD 06/01/24 [id number]  "

## 2024-06-01 NOTE — ED Notes (Addendum)
 Head wound previously dressed with band-aid. Remains CDI. Pt alert, NAD, calm, eating and drinking, steady gait with cane. Taken out in w/c to ride. Denies questions or needs. Recent VSS. Going back to independent living at [facility]. DNR golden paper with d/c paperwork.

## 2024-06-01 NOTE — ED Notes (Addendum)
 EDP in to see, at [facility]. Pending d/c and transport home. Pending transport by [facility] security at ~9. Declines food and drink.

## 2024-06-01 NOTE — ED Notes (Signed)
 RN asked if patient would like staff to call family member. Patient Tony Luna at this moment and states he would not like to wake them up at this moment.

## 2024-06-02 MED ORDER — JUBBONTI 60 MG/ML ~~LOC~~ SOSY: 60.0000 mg | PREFILLED_SYRINGE | SUBCUTANEOUS | 1 refills | Status: DC

## 2024-06-02 NOTE — Addendum Note (Signed)
 Addended by: BRIEN SHARENE RAMAN on: 06/02/2024 10:48 PM   Modules accepted: Orders

## 2024-06-03 ENCOUNTER — Telehealth: Payer: Self-pay

## 2024-06-03 ENCOUNTER — Other Ambulatory Visit: Payer: Self-pay

## 2024-06-03 NOTE — Progress Notes (Signed)
 Error

## 2024-06-03 NOTE — Telephone Encounter (Signed)
 Pharmacy Patient Advocate Encounter  Insurance verification completed.   The patient is insured through [facility] ADVANTAGE/RX ADVANCE   Ran test claim for Jubbonti . Currently a quantity of 1 is a 180 day supply and the co-pay is $682.29.  This test claim was processed through [employer]- copay amounts may vary at other pharmacies due to pharmacy/plan contracts, or as the patient moves through the different stages of their insurance plan.

## 2024-06-04 ENCOUNTER — Other Ambulatory Visit (HOSPITAL_COMMUNITY): Payer: Self-pay

## 2024-06-04 ENCOUNTER — Telehealth: Payer: Self-pay

## 2024-06-04 NOTE — Telephone Encounter (Signed)
 Pt ready for scheduling for PROLIA  on or after : 06/04/24  Option# 1: Buy/Bill (Office supplied medication)  Out-of-pocket cost due at time of clinic visit: $352  Number of injection/visits approved: ---  Primary: [employer] Prolia  co-insurance: 20% Admin fee co-insurance: 0%  Secondary: --- Prolia  co-insurance:  Admin fee co-insurance:   Medical Benefit Details: Date Benefits were checked: 06/04/24 Deductible: NO/ Coinsurance: 20%/ Admin Fee: 0%  Prior Auth: N/A PA# Expiration Date:   # of doses approved: ----------------------------------------------------------------------- Option# 2- Med Obtained from pharmacy:  Pharmacy benefit: Copay $682.29 (Paid to pharmacy) Admin Fee: 0% (Pay at clinic)  Prior Auth: N/A PA# Expiration Date:   # of doses approved:   If patient wants fill through the pharmacy benefit please send prescription to: [facility], and include estimated need by date in rx notes. Pharmacy will ship medication directly to the office.  Patient NOT eligible for Prolia  Copay Card. Copay Card can make patient's cost as little as $25. Link to apply: [email or web address]://www.amgensupportplus.[email or web address]/copay  ** This summary of benefits is an estimation of the patient's out-of-pocket cost. Exact cost may very based on individual plan coverage.

## 2024-06-04 NOTE — Telephone Encounter (Signed)
 Benefit verification started in new encounter.

## 2024-06-04 NOTE — Telephone Encounter (Signed)
 Prolia VOB initiated via [email or web address]  Next Prolia inj DUE: NOW

## 2024-06-05 ENCOUNTER — Ambulatory Visit: Admitting: Podiatry

## 2024-06-05 MED ORDER — DENOSUMAB 60 MG/ML ~~LOC~~ SOSY: 60.0000 mg | PREFILLED_SYRINGE | SUBCUTANEOUS | Status: AC

## 2024-06-05 NOTE — Addendum Note (Signed)
 Addended by: BRIEN SHARENE RAMAN on: 06/05/2024 11:48 AM   Modules accepted: Orders

## 2024-06-05 NOTE — Telephone Encounter (Signed)
 Letter mailed to inform pt of cost prior to his appt in February

## 2024-06-09 ENCOUNTER — Other Ambulatory Visit

## 2024-06-09 DIAGNOSIS — K7030 Alcoholic cirrhosis of liver without ascites: Secondary | ICD-10-CM | POA: Diagnosis not present

## 2024-06-09 DIAGNOSIS — E785 Hyperlipidemia, unspecified: Secondary | ICD-10-CM | POA: Diagnosis not present

## 2024-06-09 DIAGNOSIS — E039 Hypothyroidism, unspecified: Secondary | ICD-10-CM

## 2024-06-09 DIAGNOSIS — R7301 Impaired fasting glucose: Secondary | ICD-10-CM

## 2024-06-09 DIAGNOSIS — R030 Elevated blood-pressure reading, without diagnosis of hypertension: Secondary | ICD-10-CM | POA: Diagnosis not present

## 2024-06-09 LAB — LIPID PANEL
Cholesterol: 178 mg/dL (ref 28–200)
HDL: 93.5 mg/dL
LDL Cholesterol: 71 mg/dL (ref 10–99)
NonHDL: 84.73
Total CHOL/HDL Ratio: 2
Triglycerides: 67 mg/dL (ref 10.0–149.0)
VLDL: 13.4 mg/dL (ref 0.0–40.0)

## 2024-06-09 LAB — CBC WITH DIFFERENTIAL/PLATELET
Basophils Absolute: 0.1 K/uL (ref 0.0–0.1)
Basophils Relative: 1 % (ref 0.0–3.0)
Eosinophils Absolute: 0.2 K/uL (ref 0.0–0.7)
Eosinophils Relative: 3.1 % (ref 0.0–5.0)
HCT: 37.4 % — ABNORMAL LOW (ref 39.0–52.0)
Hemoglobin: 13.1 g/dL (ref 13.0–17.0)
Lymphocytes Relative: 38.1 % (ref 12.0–46.0)
Lymphs Abs: 2 K/uL (ref 0.7–4.0)
MCHC: 35 g/dL (ref 30.0–36.0)
MCV: 105.8 fl — ABNORMAL HIGH (ref 78.0–100.0)
Monocytes Absolute: 0.5 K/uL (ref 0.1–1.0)
Monocytes Relative: 9.8 % (ref 3.0–12.0)
Neutro Abs: 2.5 K/uL (ref 1.4–7.7)
Neutrophils Relative %: 48 % (ref 43.0–77.0)
Platelets: 143 K/uL — ABNORMAL LOW (ref 150.0–400.0)
RBC: 3.53 Mil/uL — ABNORMAL LOW (ref 4.22–5.81)
RDW: 13.4 % (ref 11.5–15.5)
WBC: 5.3 K/uL (ref 4.0–10.5)

## 2024-06-09 LAB — COMPREHENSIVE METABOLIC PANEL WITH GFR
ALT: 30 U/L (ref 3–53)
AST: 46 U/L — ABNORMAL HIGH (ref 5–37)
Albumin: 3.9 g/dL (ref 3.5–5.2)
Alkaline Phosphatase: 152 U/L — ABNORMAL HIGH (ref 39–117)
BUN: 33 mg/dL — ABNORMAL HIGH (ref 6–23)
CO2: 28 meq/L (ref 19–32)
Calcium: 9.2 mg/dL (ref 8.4–10.5)
Chloride: 95 meq/L — ABNORMAL LOW (ref 96–112)
Creatinine, Ser: 1.35 mg/dL (ref 0.40–1.50)
GFR: 49.2 mL/min — ABNORMAL LOW
Glucose, Bld: 131 mg/dL — ABNORMAL HIGH (ref 70–99)
Potassium: 4.9 meq/L (ref 3.5–5.1)
Sodium: 133 meq/L — ABNORMAL LOW (ref 135–145)
Total Bilirubin: 1.7 mg/dL — ABNORMAL HIGH (ref 0.2–1.2)
Total Protein: 7.1 g/dL (ref 6.0–8.3)

## 2024-06-09 LAB — MICROALBUMIN / CREATININE URINE RATIO
Creatinine,U: 99.5 mg/dL
Microalb Creat Ratio: UNDETERMINED mg/g (ref 0.0–30.0)
Microalb, Ur: <0.7 mg/dL

## 2024-06-09 LAB — TSH: TSH: 2.98 u[IU]/mL (ref 0.35–5.50)

## 2024-06-09 LAB — LDL CHOLESTEROL, DIRECT: Direct LDL: 73 mg/dL

## 2024-06-10 ENCOUNTER — Ambulatory Visit: Payer: Self-pay | Admitting: Internal Medicine

## 2024-06-22 ENCOUNTER — Other Ambulatory Visit: Payer: Self-pay | Admitting: Internal Medicine

## 2024-06-22 NOTE — Telephone Encounter (Signed)
 Not in patients current medication list.   Last OV: 05/26/2024 Next OV: 08/26/2024

## 2024-07-07 ENCOUNTER — Ambulatory Visit: Payer: PPO

## 2024-07-13 ENCOUNTER — Ambulatory Visit

## 2024-08-26 ENCOUNTER — Ambulatory Visit: Admitting: Internal Medicine

## 2024-11-18 ENCOUNTER — Inpatient Hospital Stay

## 2025-05-12 ENCOUNTER — Inpatient Hospital Stay: Admitting: Oncology

## 2025-05-12 ENCOUNTER — Inpatient Hospital Stay
